# Patient Record
Sex: Female | Born: 1975
Health system: Southern US, Community
[De-identification: ages and names within clinical notes are randomized; demographics above are authoritative.]

## PROBLEM LIST (undated history)

## (undated) DIAGNOSIS — T4145XA Adverse effect of unspecified anesthetic, initial encounter: Secondary | ICD-10-CM

## (undated) DIAGNOSIS — I951 Orthostatic hypotension: Secondary | ICD-10-CM

## (undated) DIAGNOSIS — Z8701 Personal history of pneumonia (recurrent): Secondary | ICD-10-CM

## (undated) DIAGNOSIS — G971 Other reaction to spinal and lumbar puncture: Secondary | ICD-10-CM

## (undated) DIAGNOSIS — M797 Fibromyalgia: Secondary | ICD-10-CM

## (undated) DIAGNOSIS — F419 Anxiety disorder, unspecified: Secondary | ICD-10-CM

## (undated) DIAGNOSIS — Z9229 Personal history of other drug therapy: Secondary | ICD-10-CM

## (undated) DIAGNOSIS — R112 Nausea with vomiting, unspecified: Secondary | ICD-10-CM

## (undated) DIAGNOSIS — G90A Postural orthostatic tachycardia syndrome (POTS): Secondary | ICD-10-CM

## (undated) DIAGNOSIS — G8929 Other chronic pain: Secondary | ICD-10-CM

## (undated) DIAGNOSIS — R531 Weakness: Secondary | ICD-10-CM

## (undated) DIAGNOSIS — M2550 Pain in unspecified joint: Secondary | ICD-10-CM

## (undated) DIAGNOSIS — R000 Tachycardia, unspecified: Secondary | ICD-10-CM

## (undated) DIAGNOSIS — Z8709 Personal history of other diseases of the respiratory system: Secondary | ICD-10-CM

## (undated) DIAGNOSIS — J302 Other seasonal allergic rhinitis: Secondary | ICD-10-CM

## (undated) DIAGNOSIS — I10 Essential (primary) hypertension: Secondary | ICD-10-CM

## (undated) DIAGNOSIS — R351 Nocturia: Secondary | ICD-10-CM

## (undated) DIAGNOSIS — M62838 Other muscle spasm: Secondary | ICD-10-CM

## (undated) DIAGNOSIS — Z9889 Other specified postprocedural states: Secondary | ICD-10-CM

## (undated) DIAGNOSIS — Z8711 Personal history of peptic ulcer disease: Secondary | ICD-10-CM

## (undated) DIAGNOSIS — G43909 Migraine, unspecified, not intractable, without status migrainosus: Secondary | ICD-10-CM

## (undated) DIAGNOSIS — E119 Type 2 diabetes mellitus without complications: Secondary | ICD-10-CM

## (undated) DIAGNOSIS — R42 Dizziness and giddiness: Secondary | ICD-10-CM

## (undated) DIAGNOSIS — G932 Benign intracranial hypertension: Secondary | ICD-10-CM

## (undated) DIAGNOSIS — IMO0002 Reserved for concepts with insufficient information to code with codable children: Secondary | ICD-10-CM

## (undated) DIAGNOSIS — I498 Other specified cardiac arrhythmias: Secondary | ICD-10-CM

## (undated) DIAGNOSIS — G43709 Chronic migraine without aura, not intractable, without status migrainosus: Secondary | ICD-10-CM

## (undated) DIAGNOSIS — K219 Gastro-esophageal reflux disease without esophagitis: Secondary | ICD-10-CM

## (undated) DIAGNOSIS — M549 Dorsalgia, unspecified: Secondary | ICD-10-CM

## (undated) DIAGNOSIS — M2540 Effusion, unspecified joint: Secondary | ICD-10-CM

## (undated) DIAGNOSIS — Z8719 Personal history of other diseases of the digestive system: Secondary | ICD-10-CM

## (undated) DIAGNOSIS — R0600 Dyspnea, unspecified: Secondary | ICD-10-CM

## (undated) DIAGNOSIS — M1990 Unspecified osteoarthritis, unspecified site: Secondary | ICD-10-CM

## (undated) DIAGNOSIS — T7840XA Allergy, unspecified, initial encounter: Secondary | ICD-10-CM

## (undated) DIAGNOSIS — K5900 Constipation, unspecified: Secondary | ICD-10-CM

## (undated) DIAGNOSIS — J189 Pneumonia, unspecified organism: Secondary | ICD-10-CM

## (undated) DIAGNOSIS — F329 Major depressive disorder, single episode, unspecified: Secondary | ICD-10-CM

## (undated) DIAGNOSIS — F32A Depression, unspecified: Secondary | ICD-10-CM

## (undated) HISTORY — PX: CYSTOSCOPY W/ URETERAL STENT REMOVAL: SHX1430

## (undated) HISTORY — DX: Migraine, unspecified, not intractable, without status migrainosus: G43.909

## (undated) HISTORY — DX: Anxiety disorder, unspecified: F41.9

## (undated) HISTORY — PX: BREAST SURGERY: SHX581

## (undated) HISTORY — DX: Personal history of pneumonia (recurrent): Z87.01

## (undated) HISTORY — PX: ESOPHAGEAL DILATION: SHX303

## (undated) HISTORY — DX: Tachycardia, unspecified: R00.0

## (undated) HISTORY — DX: Gastro-esophageal reflux disease without esophagitis: K21.9

## (undated) HISTORY — DX: Benign intracranial hypertension: G93.2

## (undated) HISTORY — DX: Dyspnea, unspecified: R06.00

## (undated) HISTORY — PX: CHOLECYSTECTOMY: SHX55

## (undated) HISTORY — DX: Depression, unspecified: F32.A

## (undated) HISTORY — DX: Fibromyalgia: M79.7

## (undated) HISTORY — DX: Orthostatic hypotension: I95.1

## (undated) HISTORY — DX: Essential (primary) hypertension: I10

## (undated) HISTORY — DX: Reserved for concepts with insufficient information to code with codable children: IMO0002

## (undated) HISTORY — DX: Other specified postprocedural states: Z98.890

## (undated) HISTORY — DX: Other specified cardiac arrhythmias: I49.8

## (undated) HISTORY — PX: TUBAL LIGATION: SHX77

## (undated) HISTORY — PX: ABDOMINAL HYSTERECTOMY: SHX81

## (undated) HISTORY — DX: Postural orthostatic tachycardia syndrome (POTS): G90.A

## (undated) HISTORY — DX: Major depressive disorder, single episode, unspecified: F32.9

## (undated) HISTORY — PX: BLADDER SURGERY: SHX569

## (undated) HISTORY — DX: Other seasonal allergic rhinitis: J30.2

## (undated) HISTORY — PX: BACK SURGERY: SHX140

---
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
## 2001-02-24 HISTORY — PX: COLONOSCOPY: SHX174

## 2001-02-24 HISTORY — PX: ESOPHAGOGASTRODUODENOSCOPY: SHX1529

## 2001-12-23 ENCOUNTER — Ambulatory Visit (HOSPITAL_COMMUNITY): Admission: RE | Admit: 2001-12-23 | Discharge: 2001-12-23 | Payer: Self-pay | Admitting: Internal Medicine

## 2002-02-24 HISTORY — PX: ESOPHAGOGASTRODUODENOSCOPY: SHX1529

## 2003-01-02 ENCOUNTER — Emergency Department (HOSPITAL_COMMUNITY): Admission: EM | Admit: 2003-01-02 | Discharge: 2003-01-03 | Payer: Self-pay | Admitting: Emergency Medicine

## 2003-01-04 ENCOUNTER — Ambulatory Visit (HOSPITAL_COMMUNITY): Admission: RE | Admit: 2003-01-04 | Discharge: 2003-01-04 | Payer: Self-pay | Admitting: Internal Medicine

## 2004-01-03 ENCOUNTER — Ambulatory Visit: Payer: Self-pay | Admitting: Gastroenterology

## 2004-01-04 ENCOUNTER — Ambulatory Visit (HOSPITAL_COMMUNITY): Admission: RE | Admit: 2004-01-04 | Discharge: 2004-01-04 | Payer: Self-pay | Admitting: Internal Medicine

## 2004-01-25 ENCOUNTER — Ambulatory Visit (HOSPITAL_COMMUNITY): Admission: RE | Admit: 2004-01-25 | Discharge: 2004-01-25 | Payer: Self-pay | Admitting: Internal Medicine

## 2004-02-25 HISTORY — PX: ESOPHAGOGASTRODUODENOSCOPY: SHX1529

## 2004-12-05 ENCOUNTER — Ambulatory Visit: Payer: Self-pay | Admitting: Internal Medicine

## 2004-12-18 ENCOUNTER — Ambulatory Visit (HOSPITAL_COMMUNITY): Admission: RE | Admit: 2004-12-18 | Discharge: 2004-12-18 | Payer: Self-pay | Admitting: Internal Medicine

## 2004-12-18 ENCOUNTER — Ambulatory Visit: Payer: Self-pay | Admitting: Internal Medicine

## 2004-12-26 ENCOUNTER — Encounter (HOSPITAL_COMMUNITY): Admission: RE | Admit: 2004-12-26 | Discharge: 2005-01-25 | Payer: Self-pay | Admitting: Internal Medicine

## 2005-07-02 ENCOUNTER — Ambulatory Visit: Payer: Self-pay | Admitting: Internal Medicine

## 2005-10-15 ENCOUNTER — Ambulatory Visit: Payer: Self-pay | Admitting: Cardiology

## 2005-10-19 ENCOUNTER — Ambulatory Visit: Payer: Self-pay | Admitting: Cardiology

## 2005-10-22 ENCOUNTER — Ambulatory Visit: Payer: Self-pay | Admitting: Cardiology

## 2005-11-03 ENCOUNTER — Encounter: Payer: Self-pay | Admitting: Cardiology

## 2005-11-06 ENCOUNTER — Ambulatory Visit: Payer: Self-pay | Admitting: Cardiology

## 2005-11-10 ENCOUNTER — Ambulatory Visit: Payer: Self-pay | Admitting: Cardiology

## 2005-11-11 ENCOUNTER — Ambulatory Visit (HOSPITAL_COMMUNITY): Admission: AD | Admit: 2005-11-11 | Discharge: 2005-11-12 | Payer: Self-pay | Admitting: Cardiology

## 2005-11-11 ENCOUNTER — Ambulatory Visit: Payer: Self-pay | Admitting: Cardiology

## 2005-11-20 ENCOUNTER — Ambulatory Visit: Payer: Self-pay | Admitting: Internal Medicine

## 2005-12-01 ENCOUNTER — Ambulatory Visit: Payer: Self-pay | Admitting: Cardiology

## 2006-01-16 ENCOUNTER — Ambulatory Visit: Payer: Self-pay | Admitting: Cardiology

## 2006-04-16 ENCOUNTER — Encounter: Payer: Self-pay | Admitting: Cardiology

## 2006-07-26 ENCOUNTER — Encounter: Payer: Self-pay | Admitting: Cardiology

## 2006-08-19 ENCOUNTER — Encounter: Payer: Self-pay | Admitting: Cardiology

## 2006-09-08 ENCOUNTER — Ambulatory Visit: Payer: Self-pay | Admitting: Cardiology

## 2006-09-16 ENCOUNTER — Ambulatory Visit: Payer: Self-pay | Admitting: Cardiology

## 2007-01-19 ENCOUNTER — Ambulatory Visit: Payer: Self-pay | Admitting: Gastroenterology

## 2007-02-25 HISTORY — PX: ESOPHAGOGASTRODUODENOSCOPY: SHX1529

## 2007-12-04 ENCOUNTER — Emergency Department (HOSPITAL_COMMUNITY): Admission: EM | Admit: 2007-12-04 | Discharge: 2007-12-04 | Payer: Self-pay | Admitting: Emergency Medicine

## 2007-12-15 ENCOUNTER — Ambulatory Visit: Payer: Self-pay | Admitting: Internal Medicine

## 2007-12-21 ENCOUNTER — Ambulatory Visit: Payer: Self-pay | Admitting: Internal Medicine

## 2007-12-21 ENCOUNTER — Ambulatory Visit (HOSPITAL_COMMUNITY): Admission: RE | Admit: 2007-12-21 | Discharge: 2007-12-21 | Payer: Self-pay | Admitting: Internal Medicine

## 2008-02-08 ENCOUNTER — Ambulatory Visit (HOSPITAL_COMMUNITY): Admission: RE | Admit: 2008-02-08 | Discharge: 2008-02-09 | Payer: Self-pay | Admitting: Surgery

## 2008-02-08 ENCOUNTER — Encounter (INDEPENDENT_AMBULATORY_CARE_PROVIDER_SITE_OTHER): Payer: Self-pay | Admitting: Surgery

## 2008-02-25 HISTORY — PX: CARDIAC CATHETERIZATION: SHX172

## 2009-04-17 ENCOUNTER — Encounter: Payer: Self-pay | Admitting: Cardiology

## 2009-04-18 ENCOUNTER — Telehealth (INDEPENDENT_AMBULATORY_CARE_PROVIDER_SITE_OTHER): Payer: Self-pay | Admitting: *Deleted

## 2009-11-05 ENCOUNTER — Ambulatory Visit (HOSPITAL_COMMUNITY)
Admission: RE | Admit: 2009-11-05 | Discharge: 2009-11-05 | Payer: Self-pay | Source: Home / Self Care | Admitting: Orthopedic Surgery

## 2010-01-10 ENCOUNTER — Telehealth (INDEPENDENT_AMBULATORY_CARE_PROVIDER_SITE_OTHER): Payer: Self-pay | Admitting: *Deleted

## 2010-01-16 ENCOUNTER — Ambulatory Visit: Payer: Self-pay | Admitting: Internal Medicine

## 2010-01-16 DIAGNOSIS — R112 Nausea with vomiting, unspecified: Secondary | ICD-10-CM

## 2010-01-16 DIAGNOSIS — R1013 Epigastric pain: Secondary | ICD-10-CM

## 2010-01-16 DIAGNOSIS — R1310 Dysphagia, unspecified: Secondary | ICD-10-CM | POA: Insufficient documentation

## 2010-01-16 DIAGNOSIS — K219 Gastro-esophageal reflux disease without esophagitis: Secondary | ICD-10-CM

## 2010-01-21 DIAGNOSIS — G971 Other reaction to spinal and lumbar puncture: Secondary | ICD-10-CM

## 2010-01-21 DIAGNOSIS — R002 Palpitations: Secondary | ICD-10-CM | POA: Insufficient documentation

## 2010-01-21 DIAGNOSIS — R000 Tachycardia, unspecified: Secondary | ICD-10-CM | POA: Insufficient documentation

## 2010-01-21 DIAGNOSIS — I200 Unstable angina: Secondary | ICD-10-CM

## 2010-01-21 DIAGNOSIS — R072 Precordial pain: Secondary | ICD-10-CM | POA: Insufficient documentation

## 2010-01-22 ENCOUNTER — Ambulatory Visit: Payer: Self-pay | Admitting: Cardiology

## 2010-01-22 ENCOUNTER — Encounter: Payer: Self-pay | Admitting: Physician Assistant

## 2010-01-28 ENCOUNTER — Encounter: Payer: Self-pay | Admitting: Cardiology

## 2010-01-30 ENCOUNTER — Encounter (INDEPENDENT_AMBULATORY_CARE_PROVIDER_SITE_OTHER): Payer: Self-pay | Admitting: *Deleted

## 2010-02-06 ENCOUNTER — Telehealth (INDEPENDENT_AMBULATORY_CARE_PROVIDER_SITE_OTHER): Payer: Self-pay

## 2010-02-06 ENCOUNTER — Encounter: Payer: Self-pay | Admitting: Internal Medicine

## 2010-02-12 ENCOUNTER — Ambulatory Visit (HOSPITAL_COMMUNITY)
Admission: RE | Admit: 2010-02-12 | Discharge: 2010-02-12 | Payer: Self-pay | Source: Home / Self Care | Attending: Internal Medicine | Admitting: Internal Medicine

## 2010-02-20 ENCOUNTER — Ambulatory Visit: Payer: Self-pay | Admitting: Physician Assistant

## 2010-03-16 ENCOUNTER — Encounter (INDEPENDENT_AMBULATORY_CARE_PROVIDER_SITE_OTHER): Payer: Self-pay | Admitting: Internal Medicine

## 2010-03-26 NOTE — Progress Notes (Signed)
Summary: elevated bp & hr   Phone Note Call from Patient   Summary of Call: Patient c/o elevated bp and heart rate.  OV already scheduled for 11/29.  States she is just not feeling good.  States she had been put on Diltiazem by Dr. Neita Carp 2 wks. ago but is still running high.  Advised her that Dr. Andee Lineman is full to after January.  The 11/29 time is going to be the soonest we can get her in.  Since we have not seen her in 2 years, it will be hard to advise her until MD evaluates in office.  Advised her to call her PMD back since he initially prescribed new med which is not controlling her BP.  If PMD feels that she can not wailt till 11/29, he needs to call office and request MD for earlier appt.  .Patient verbalized understanding.  Initial call taken by: Hoover Brunette, LPN,  January 10, 2010 4:54 PM

## 2010-03-26 NOTE — Letter (Signed)
Summary: Engineer, materials at [facility]  [address]   [place], [place] [address]   Phone: [phone]  Fax: [phone]        January 30, 2010 MRN: [id number]    [facility] [address] [place], [place]  [address]    Dear [name],  Your test ordered by Selena Batten has been reviewed by your physician (or physician assistant) and was found to be normal or stable. Your physician (or physician assistant) felt no changes were needed at this time.  ____ Echocardiogram  ____ Cardiac Stress Test  ____ Lab Work  ____ Peripheral vascular study of arms, legs or neck  ____ CT scan or X-ray  ____ Lung or Breathing test  __X__ Other: Monitor   Thank you.   Cyril Loosen, RN, BSN    Duane Boston, M.D., F.A.C.C. Thressa Sheller, M.D., F.A.C.C. Oneal Grout, M.D., F.A.C.C. Cheree Ditto, M.D., F.A.C.C. Daiva Nakayama, M.D., F.A.C.C. Kenney Houseman, M.D., F.A.C.C. Jeanne Ivan, PA-C

## 2010-03-26 NOTE — Procedures (Signed)
Summary: Holter Monitor Final Summary  Holter Monitor Final Summary   Imported By: Cyril Loosen, RN, BSN 01/30/2010 15:02:52  _____________________________________________________________________  External Attachment:    Type:   Image     Comment:   External Document  Appended Document: Holter Monitor Final Summary Pt notified of results by letter.

## 2010-03-26 NOTE — Progress Notes (Signed)
Summary: PATIENT REQUESTING EARLIER APPT   Phone Note Call from Patient Call back at Home Phone [phone]   Caller: Patient Reason for Call: Talk to Nurse Details for Reason: [facility] Summary of Call: PATIENT CALLED STATED SHE WAS RELEASED FROM [facility] LAST NIGHT AND WAS ADVISED TO CONTACT OUR OFFICE FOR AN APPOINTMENT AND TO SET UP AN ECHO.  SHE WENT IN FOR RAPID HEART RATE AND DIZZINESS AND NAUSEA.  I MADE  APPOINTMENT FOR 3/29N (OUR FIRST AVAILABLE) ALSO PUTTING ON CXL LIST. I EXPLAINED TO HER THAT I WOULD FORWARD TO HIS NURSE TO DISCUSS WITH DEGENT.  IF HE FEELS SHE NEEDS TO BE SEEN SOONER THEN WE COULD CHANGE APPT DATE. Initial call taken by: Claudette Laws,  April 18, 2009 12:30 PM  Follow-up for Phone Call        gave patient appt for March 1st @ 1:45 Follow-up by: Claudette Laws,  April 20, 2009 2:26 PM

## 2010-03-26 NOTE — Assessment & Plan Note (Signed)
Summary: Sandra [name] stating her heart beating fast   Visit Type:  Follow-up Primary Provider:  Dr. Sherril Croon  CC:  heart racing and sob.  History of Present Illness: 35 yo with history of dysautonomia (possible POTS) and HTN presents for evaluation of palpitations.  Patient was evaluated in 2007 for tachypalpitations and chest pain.  She had a normal right and left heart cath at that time.  Monitor showed only sinus tachycardia.   She was thought to have a form of dysautonomia, possibly POTS.  She was started on atenolol at that time with considerable improvement.  However, she has now been off atenolol for over a year.  She had been doing well but has been under a lot of stress the last few months.  She was laid off this summer.  Since that time, she has had daily palpitations 2-3 times a day where she will feel her heart pound/race and will get a choking sensation in her throat.  No particular trigger.  No lightheadedness/syncope.  Heart rate has been as high as the 120s-130s when she has measured it during a spell of palpitations.  She had these same symptoms back in 2007.  She also had been running high blood pressure recently, but this has resolved with starting valsartan/HCT.    Orthostatics were negative.   ECG: NSR, normal  Preventive Screening-Counseling & Management  Alcohol-Tobacco     Smoking Status: never  Current Medications (verified): 1)  Ibuprofen 800 Mg Tabs (Ibuprofen) .... As Needed 2)  Amitriptyline Hcl 25 Mg Tabs (Amitriptyline Hcl) .... Take 1 Tablet By Mouth Once A Day 3)  Diovan Hct 160-12.5 Mg Tabs (Valsartan-Hydrochlorothiazide) .... 1/2 Tablet Daily 4)  Execedrin .... As Needed 5)  Nexium 40 Mg Cpdr (Esomeprazole Magnesium) .... One By Mouth 30 Mins Before Breakfast Daily  Allergies (verified): 1)  ! Vicodin 2)  ! * Ambien  Comments:  Nurse/Medical Assistant: The patient's medications and allergies were verbally reviewed with the patient and were updated in  the Medication and Allergy Lists.  Past History:  Past Medical History: 1. Fibromyalgia. 2. Depression. 3. Anxiety. 4. Migraine headaches. 5. Hypertension. 6. Chest pain and dyspnea: RHC/LHC in 2007 with normal PCWP and PA pressure, normal coronaries, EF 55% 7. Possible POTS: episodes of sinus tachycardia.  Holter in 2007 with sinus tachycardia with rates as high at 160.  8. History of duodenal ulcer in 1993. 9. GERD with esophageal stricture 10. Hysterectomy 11. Cholecystectomy  Family History: Mother, kidney cancer, diabetes mellitus.   Maternal grandmother died with pancreatic cancer. Maternal grandfather had prostate cancer.   Father and paternal grandmother had ulcers/stomach cancer. Paternal Grandmother died with metastatic lung cancer.  Father with CAD, PCI at age 1 Grandfather with AAA repair  Social History: Divorced.  She has 2 children. Lost job at [employer]. She is a nonsmoker.  No alcohol use.  Lives in [place].  Smoking Status:  never  Review of Systems       All systems reviewed and negative except as per HPI.   Vital Signs:  Patient profile:   35 year old female Height:      67 inches Weight:      267 pounds Pulse rate:   82 / minute Pulse (ortho):   96 / minute BP sitting:   125 / 85  (left arm) BP standing:   117 / 87 Cuff size:   large  Vitals Entered By: Carlye Grippe (January 22, 2010 10:02 AM)  Serial  Vital Signs/Assessments:  Time      Position  BP       Pulse  Resp  Temp     By 10:58 AM  Lying RA  118/80   75                    Cyril Loosen, RN, BSN 10:58 AM  Sitting   123/84   80                    Cyril Loosen, RN, BSN 10:58 AM  Standing  117/87   96                    Cyril Loosen, Charity fundraiser, BSN  Comments: 10:58 AM 2 mins standing BP 122/87 HR 90, 5 mins standing BP 124/88, HR 92 By: Cyril Loosen, RN, BSN   CC: heart racing,sob   Physical Exam  General:  Well developed, well nourished, in no acute distress.  Obese.    Head:  normocephalic and atraumatic Nose:  no deformity, discharge, inflammation, or lesions Mouth:  Teeth, gums and palate normal. Oral mucosa normal. Neck:  Neck supple, no JVD. No masses, thyromegaly or abnormal cervical nodes. Lungs:  Clear bilaterally to auscultation and percussion. Heart:  Non-displaced PMI, chest non-tender; regular rate and rhythm, S1, S2 without murmurs, rubs or gallops. Carotid upstroke normal, no bruit.  Pedals normal pulses. No edema, no varicosities. Abdomen:  Bowel sounds positive; abdomen soft and non-tender without masses, organomegaly, or hernias noted. No hepatosplenomegaly. Msk:  Back normal, normal gait. Muscle strength and tone normal. Extremities:  No clubbing or cyanosis. Neurologic:  Alert and oriented x 3. Skin:  Intact without lesions or rashes. Psych:  anxious.     Impression & Recommendations:  Problem # 1:  PALPITATIONS (ICD-785.1) Tachypalpitations with heart rate up to the 120s-130s at times.  Same symptoms as in 2007.  Clinical picture at that time was thought to be consistent with a dysautonomia, possibly POTS.  Atenolol helped a lot in the past.  Negative orthostatics today.   I will get an echo to make sure her heart is structurally normal and will get a 48 hour holter to reassess rhythm.  I will start her on low-dose atenolol 25 mg daily.   Other Orders: EKG w/ Interpretation (93000) Holter Monitor (Holter Monitor) 2-D Echocardiogram (2D Echo)  Patient Instructions: 1)  Follow up with Gene Serpe, PA-C on Wed, February 20, 2010 at 1:20pm. 2)  Your physician has requested that you have an echocardiogram.  Echocardiography is a painless test that uses sound waves to create images of your heart. It provides your doctor with information about the size and shape of your heart and how well your heart's chambers and valves are working.  This procedure takes approximately one hour. There are no restrictions for this procedure. 3)  Your  physician has recommended that you wear a holter monitor.  Holter monitors are medical devices that record the heart's electrical activity. Doctors most often use these monitors to diagnose arrhythmias. Arrhythmias are problems with the speed or rhythm of the heartbeat. The monitor is a small, portable device. You can wear one while you do your normal daily activities. This is usually used to diagnose what is causing palpitations/syncope (passing out). 4)  Start Atenolol 25mg  by mouth once daily. 5)  Call the office if top number of your blood press is running less than 100.  Prescriptions: ATENOLOL 25 MG TABS (ATENOLOL)  Take one tablet by mouth daily  #30 x 6   Entered by:   Cyril Loosen, RN, BSN   Authorized by:   Marca Ancona, MD   Signed by:   Cyril Loosen, RN, BSN on 01/22/2010   Method used:   Electronically to        [facility] (retail)       [address]       [place], [place]  [address]       Ph: [phone]       Fax: [phone]   RxID:   [id number]   Handout requested.

## 2010-03-26 NOTE — Procedures (Signed)
Summary: Pulmonary/ PULSE OXIMETRY  Pulmonary/ PULSE OXIMETRY   Imported By: Dorise Hiss 04/23/2009 15:13:36  _____________________________________________________________________  External Attachment:    Type:   Image     Comment:   External Document

## 2010-03-26 NOTE — Procedures (Signed)
Summary: Holter and Event/ [facility]ARD[facility] END OF SERVICE SUMMARY REPORT  Holter and Event/ CARDIONET END OF SERVICE SUMMARY REPORT   Imported By: Dorise Hiss 04/23/2009 15:01:12  _____________________________________________________________________  External Attachment:    Type:   Image     Comment:   External Document

## 2010-03-26 NOTE — Letter (Signed)
Summary: EGD/ED ORDER  EGD/ED ORDER   Imported By: Ave Filter 01/16/2010 12:27:39  _____________________________________________________________________  External Attachment:    Type:   Image     Comment:   External Document

## 2010-03-28 NOTE — Letter (Signed)
Summary: EGD/ED ORDERS  EGD/ED ORDERS   Imported By: Rexene Alberts 02/06/2010 16:25:01  _____________________________________________________________________  External Attachment:    Type:   Image     Comment:   External Document

## 2010-03-28 NOTE — Assessment & Plan Note (Signed)
Summary: DYSPHAGIA/CONSULT FOR EGD/SS   Visit Type:  Initial Consult Referring [name]:  [facility] [name]:  [facility]  Chief Complaint:  dysphagia.  History of Present Illness: Sandra [name] is here for further evaluation of dysphagia. She was last seen in 2009. She notes over the last several weeks she has had several episodes of "choking" with and without meals. She may be sitting or laying when she feels/hears gurgling in her chest. She becomes SOB and has starts to cough. She will usually regurgitate bitter fluid up when this occurs. She c/o dysphagia to solids and water. No heartburn. Off PPI for one year, takes OTC antacids only. Epigasatric pain and bloating. BM unchanged. Some pp loose stool. Some intermittent constipation. No melena, brbpr. No unintentional weight loss. In 10/09 she weighted 280. She states she lost down to 240 pounds but with increased stress she is back up to 264.  She c/o increased stress. Divorced finalized couple of months ago. She lost her job at [employer] after  ten years. She was offered a transfer to [employer] but couldn't take it due to her family situation.  She has been having increased difficulty with hypertension and tachycardia.  She went to ED in [place] two days ago. She states CXR negative for PNA. She was given Protonix IV. She reports her lungs checked out okay.  Current Medications (verified): 1)  Ibuprofen 800 Mg Tabs (Ibuprofen) .... As Needed 2)  Amitriptyline Hcl 25 Mg Tabs (Amitriptyline Hcl) .... Take 1 Tablet By Mouth Once A Day 3)  Diovan Hct 160-12.5 Mg Tabs (Valsartan-Hydrochlorothiazide) .... 1/2 Tablet Daily 4)  Execedrin .... As Needed  Allergies (verified): 1)  ! Vicodin 2)  ! * Ambien  Past History:  Past Medical History: Fibromyalgia. Depression. Anxiety. Migraine headaches. Hypertension. She underwent cardiac catheterization this year due to syncope.     She states it was okay. Possible POTS History of  duodenal ulcer in 1993. Allergies. EGD, 10/09-->small hiatal hernia EGD, 2006-->noncritical Schatzki's ring proximal to GEJ, s/p 54 and 36F Maloney dilatioin. TCS, 2003-->moderate external hemorrhoids with normal TI   Past Surgical History: She had a bladder tack. 8. Esophagus stretched on multiple occasions. 9. Hysterectomy. 10.Tubal ligation. Cholecystectomy  Family History: Mother, kidney cancer, diabetes mellitus.   Maternal grandmother died with pancreatic cancer. Maternal grandfather had prostate cancer.   Father and paternal grandmother had ulcers/stomach cancer. Paternal Grandmother died with metastatic lung cancer.   Social History: Divorced.  She has 2 children. Lost job at [employer]. She is a nonsmoker.  No alcohol use.   Review of Systems General:  Denies fever, chills, sweats, anorexia, fatigue, weakness, and weight loss. Eyes:  Denies vision loss. ENT:  Complains of difficulty swallowing; denies nasal congestion, sore throat, and hoarseness. CV:  Complains of palpitations; denies chest pains, angina, dyspnea on exertion, and peripheral edema. Resp:  Complains of dyspnea with exercise; denies dyspnea at rest, cough, sputum, and wheezing. GI:  See HPI. GU:  Denies urinary burning and blood in urine. MS:  Complains of joint pain / LOM. Derm:  Denies rash and itching. Neuro:  Complains of headache; denies weakness, frequent headaches, and confusion. Psych:  Denies depression and anxiety. Endo:  Denies unusual weight change. Heme:  Denies bruising and bleeding. Allergy:  Denies hives and rash.  Vital Signs:  Patient profile:   35 year old female Height:      67 inches Weight:      264 pounds BMI:  41.50 Temp:     98.6 degrees F oral Pulse rate:   60 / minute BP sitting:   128 / 90  (left arm) Cuff size:   large  Vitals Entered By: Cloria Spring LPN (January 16, 2010 9:12 AM)  Physical Exam  General:  Well developed, well nourished, no acute  distress.obese.   Head:  Normocephalic and atraumatic. Eyes:  sclera nonicteric Mouth:  Oropharyngeal mucosa moist, pink.  No lesions, erythema or exudate.    Neck:  Supple; no masses or thyromegaly. Lungs:  Clear throughout to auscultation. Heart:  Regular rate and rhythm; no murmurs, rubs,  or bruits. Abdomen:  Obese. Positive BS. Soft. No HSM or masses. No rebound or guarding. No abd bruit or hernia. Mild lower abd tenderness. Extremities:  No clubbing, cyanosis, edema or deformities noted. Neurologic:  Alert and  oriented x4;  grossly normal neurologically. Skin:  Intact without significant lesions or rashes. Cervical Nodes:  No significant cervical adenopathy. Psych:  Alert and cooperative. Normal mood and affect.  Impression & Recommendations:  Problem # 1:  DYSPHAGIA UNSPECIFIED (ICD-787.20)  Dysphagia to solids and liquids. She also has interesting symptoms of "choking" at times not related to meals. It sounds like she is having significant regurgitation which induces the coughing and vomiting spells. She typcial heartburn. She is off her PPI, takes OTC antacids only. Does not take significanct ASA or NSAIDS.   She also has epigastric pain. GB out a couple of years ago. ?gastritis, GERD, PUD, hiatal hernia.   EGD/ED to be performed in near future.  Risks, alternatives, benefits including but not limited to risk of reaction to medications, bleeding, infection, and perforation addressed.  Patient voiced understanding and verbal consent obtained.   Start Nexium 40mg  by mouth daily, #15 and RX given. She will follow antireflux measures.  Orders: Est. Patient Level IV (82956) Prescriptions: NEXIUM 40 MG CPDR (ESOMEPRAZOLE MAGNESIUM) one by mouth 30 mins before breakfast daily  #30 x 11   Entered and Authorized by:   Leanna Battles. Dixon Boos   Signed by:   Leanna Battles Lewis PA-C on 01/16/2010   Method used:   Print then Give to Patient   RxID:   [id number]

## 2010-03-28 NOTE — Progress Notes (Signed)
Summary: phone note/ pt requests Protonix  Phone Note Call from Patient   Caller: Patient Summary of Call: Pt called to reschedule her EGD/ED that she had had to cancel due to the death of her grandfather. She is rescheduled for  02/12/2010 at 8:00 AM. Selena Batten is aware. She had her instructions. She also said the Nexium is not helping much. She would like to try Protonix. She uses [facility] in [place].  Initial call taken by: Cloria Spring LPN,  February 06, 2010 10:13 AM     Appended Document: phone note/ pt requests Protonix ok on protonix 40 mg daily; #30 w 2 more refills  Appended Document: phone note/ pt requests Protonix Pt informed. Rx called to Diane at [facility].

## 2010-03-28 NOTE — Letter (Signed)
Summary: Appointment -missed  [facility] [address]   [place], [place] [address]   Phone: [phone]  Fax: [phone]     February 20, 2010 MRN: [id number]    [facility] [address] [place], [place]  [address]    Dear [name],  Our records indicate you missed your appointment on February 20, 2010                        with Gene Serpe.   It is very important that we reach you to reschedule this appointment. We look forward to participating in your health care needs.   Please contact us at the number listed above at your earliest convenience to reschedule this appointment.   Sincerely,    Glass blower/designer

## 2010-04-29 ENCOUNTER — Emergency Department (HOSPITAL_COMMUNITY)
Admission: EM | Admit: 2010-04-29 | Discharge: 2010-04-29 | Disposition: A | Discharge status: Home / Self Care | Payer: No Typology Code available for payment source | Attending: Emergency Medicine | Admitting: Emergency Medicine

## 2010-04-29 ENCOUNTER — Emergency Department (HOSPITAL_COMMUNITY): Payer: No Typology Code available for payment source

## 2010-04-29 DIAGNOSIS — Y929 Unspecified place or not applicable: Secondary | ICD-10-CM | POA: Insufficient documentation

## 2010-04-29 DIAGNOSIS — IMO0001 Reserved for inherently not codable concepts without codable children: Secondary | ICD-10-CM | POA: Insufficient documentation

## 2010-04-29 DIAGNOSIS — T148XXA Other injury of unspecified body region, initial encounter: Secondary | ICD-10-CM | POA: Insufficient documentation

## 2010-04-29 DIAGNOSIS — M542 Cervicalgia: Secondary | ICD-10-CM | POA: Insufficient documentation

## 2010-04-29 DIAGNOSIS — S139XXA Sprain of joints and ligaments of unspecified parts of neck, initial encounter: Secondary | ICD-10-CM | POA: Insufficient documentation

## 2010-04-29 DIAGNOSIS — IMO0002 Reserved for concepts with insufficient information to code with codable children: Secondary | ICD-10-CM | POA: Insufficient documentation

## 2010-04-29 DIAGNOSIS — Z79899 Other long term (current) drug therapy: Secondary | ICD-10-CM | POA: Insufficient documentation

## 2010-07-09 NOTE — Assessment & Plan Note (Signed)
[facility]                          [facility] OFFICE NOTE   NAME:[name], [name]                   MRN:          [id number]  DATE:09/08/2006                            DOB:          1976/02/22    HISTORY OF PRESENT ILLNESS:  The patient is a 35 year old female with  history of frequent palpitations.  The patient has been recently  diagnosed with obstructive sleep apnea of a moderate degree.  The  patient has had no follow up for this, however.  We have worked her up  previously with a cardiac catheterization; and also ruled her out for  pulmonary embolism.  She is felt to have possible POP syndrome and has  been maintained on atenolol per Dr. Graciela [name].  The patient also sees Dr.  Sandria [name] for migraine headaches and fibromyalgias.   From a cardiovascular aspect the patient appears to be stable.  She does  report increased lower extremity swelling as well as hand swelling.  She  is also found to be hypertensive in the clinic today.  She states that  on her recent exam with Dr. Sandria [name] she was also hypertensive.   MEDICATIONS INCLUDE:  Cymbalta, atenolol, Aciphex, Wellbutrin, Topamax.   PHYSICAL EXAMINATION:  VITAL SIGNS:  Blood pressure 137/93, heart rate  94, weight is 272 pounds.  NECK EXAM:  Normal carotid upstroke.  No carotid bruits.  LUNGS:  Clear breath sounds bilaterally.  HEART:  Regular rate and rhythm normal S1 S2, no murmurs, rubs, or  gallops.  ABDOMEN:  Soft.  EXTREMITIES EXAM:  No clubbing, cyanosis, or edema.  NEUROLOGIC:  The patient alert, oriented, and grossly nonfocal.   A 12-lead electrocardiogram -- normal sinus rhythm, no acute ischemic  changes.   PROBLEMS:  1. Palpitations, stable.  2. Rule out POP syndrome.  3. Orthostatic hypotension, resolved.  4. Normal cardiac catheterization.  5. Normal CT negative for pulmonary embolism.  6. Obesity.  7. Fibromyalgias.  8. Lower extremity edema.  9. Hypertension.  10.Migraine headaches.   PLAN:  1. Given the patient a prescription for hydrochlorothiazide for blood      pressure control.  She will have her nurse visit in one week to      recheck her blood pressure.  2. I have asked the patient to see Dr. Nigel [name]  as she had not      tolerating her CPAP mask very      well and clearly has obstructive sleep apnea, will ask Dr. Nigel [name]      for further      recommendations.  3. The patient can follow up with [place] in six months.     Sandra Codding, MD,FACC  Electronically Signed    GED/MedQ  DD: 09/08/2006  DT: 09/09/2006  Job #: [id number]

## 2010-07-09 NOTE — H&P (Signed)
NAME:  Sandra [name], Sandra [name]            ACCOUNT NO.:  [id number]   MEDICAL RECORD NO.:  [id number]          PATIENT TYPE:  AMB   LOCATION:  DAY                           FACILITY:  [facility]   PHYSICIAN:  R. Roetta Sessions, M.D. DATE OF BIRTH:  17-Mar-1975   DATE OF ADMISSION:  DATE OF DISCHARGE:  LH                              HISTORY & PHYSICAL   CHIEF COMPLAINT:  Right-sided abdominal pain, nausea, vomiting, and  diarrhea.   EGD prior to cholecystectomy.   HISTORY OF PRESENT ILLNESS:  The patient is a pleasant 35 year old obese  Caucasian female who presents today requesting an upper endoscopy prior  to cholecystectomy.  She states that she recently developed acute right-  sided abdominal pain associated with nausea and vomiting postprandially.  She also had a change in her bowel movements generally constipated and  now having postprandial loose stools.  Pain radiates into her back.  She  has had a workup including abdominal ultrasound, which was unremarkable.  Her LFTs were normal.  CBC unremarkable.  She was seen in the Emergency  Department on December 04, 2007.  She states that Dr. Dian [name] office did  a HIDA scan at [facility] last week and she had a  gallbladder ejection fraction of 33%.  Previously done through our  system, it was at 47% a couple of years ago.  Because of this change in  her current symptoms, it was felt that she need to have her gallbladder  out.  She also noted nausea after drinking Half & Half with the test.  She saw Dr. Gerrit [name] in consultation on Monday.  She tells me that he has  recommended that she have an upper endoscopy prior to considering a  cholecystectomy to exclude any upper GI source.  She does take ibuprofen  about 3 times a week.  She has had some mild regurgitation, but no  heartburn.  She takes Tums p.r.n.  She has been on PPIs in the past, but  then came off on her own.  The last time we saw her actually was in  November 2008.  Her  last colonoscopy was in October 2006.  She had a  noncritical Schatzki ring proximal to the GE junction, small hiatal  hernia.  Last colonoscopy was in 2003.  She had a moderate external  hemorrhoids and normal terminal ileoscopy.   CURRENT MEDICATIONS:  1. Wellbutrin 150 mg daily.  2. Celexa 20 mg daily.  3. Ibuprofen 800 mg two times a week.  4. Hydrochlorothiazide 25 mg daily.  5. Zofran 8 mg q.4-6 h. p.r.n.  6. Ultram 50 mg p.r.n.  7. Probiotics p.r.n.  8. Tums p.r.n.   ALLERGIES:  Verna Czech.   PAST MEDICAL HISTORY:  Fibromyalgia, depression, anxiety, migraine  headache, hypertension, unremarkable cardiac catheterization 2008 due to  syncope, history of bladder tack, prior EGDs and colonoscopies as above,  hysterectomy, tubal ligation, history of duodenal ulcer in 1993, and  allergies.   FAMILY HISTORY:  Mother had kidney cancer status post resection and  diabetes, maternal grandmother died with pancreatic cancer, maternal  grandfather had prostate cancer,  father and paternal grandmother had  ulcers, and grandmother died with metastatic lung cancer.   SOCIAL HISTORY:  She is married.  She has 2 children.  She is employed  at [employer].  She is a nonsmoker.  No alcohol use.   REVIEW OF SYSTEMS:  See HPI for GI.  CONSTITUTIONAL:  No weight loss.  CARDIOPULMONARY:  No chest pain, shortness of breath, palpitations, or  cough.  GENITOURINARY:  No dysuria or hematuria.   PHYSICAL EXAMINATION:  VITAL SIGNS:  Weight 280, height 5 feet 10  inches, temperature 98.2, blood pressure 122/88, and pulse 76.  GENERAL:  Pleasant, obese, Caucasian female in no acute distress.  SKIN:  Warm and dry.  No jaundice.  HEENT:  Sclera nonicteric.  Oropharyngeal mucosa moist and pink.  No  lesion, erythema, or exudates.  CHEST:  Lungs are clear to auscultation.  CARDIAC:  Regular rate and rhythm.  Normal S1 and S2.  No murmurs, rubs,  or gallops.  ABDOMEN:  Positive bowel sounds.   Abdomen is obese, but symmetrical.  Abdomen is soft, nondistended.  She has moderate tenderness in the right  upper quadrant region to deep palpation.  No rebound or guarding.  No  organomegaly or masses.  No abdominal bruits or hernia.  LOWER EXTREMITIES:  No edema.   IMPRESSION:  Sandra [name] is a 35 year old lady with acute onset of  right upper abdominal pain with radiation into the back postprandial,  postprandial nausea and vomiting and has had change in bowels from  constipation to diarrhea.  She has chronic gastroesophageal reflux  disease, which appears quiescent.  She is not on a PPI at this time.  She does have chronic irritable bowel syndrome, but tends to have more  lower abdominal pain and constipation with her symptoms.  She has had a  change in her gallbladder ejection fraction and subjective reproduction  of her symptoms with Half & Half fatty meal challenge.  I discussed with  her today that her gallbladder probably is playing a role in some of her  symptoms.  Given her chronic gastrointestinal issues, it would be  reasonable to exclude upper gastrointestinal source per Dr. Ardine Eng  request.   PLAN:  1. EGD with Dr. Jena Gauss in the near future.  2. I have asked her to try Kapidex 60 mg daily, #20 samples and to see      if it makes any difference in her symptoms to potentially exclude      symptoms secondary to GERD.  3. Further recommendations to follow.      Tana Coast, P.AJonathon Bellows, M.D.  Electronically Signed    LL/MEDQ  D:  12/15/2007  T:  12/16/2007  Job:  [id number]   cc:   [name]  Fax: [phone]   Velora Heckler, MD  [phone] [address] [place]  [place] [address]

## 2010-07-09 NOTE — Op Note (Signed)
NAMEVONETTE, GROSSO            ACCOUNT NO.:  [id number]   MEDICAL RECORD NO.:  [id number]          PATIENT TYPE:  OIB   LOCATION:  1605                         FACILITY:  [facility]   PHYSICIAN:  Velora Heckler, MD      DATE OF BIRTH:  1976-01-29   DATE OF PROCEDURE:  02/08/2008  DATE OF DISCHARGE:                               OPERATIVE REPORT   PREOPERATIVE DIAGNOSIS:  Abdominal pain, biliary dyskinesia.   POSTOPERATIVE DIAGNOSIS:  Abdominal pain, biliary dyskinesia.   PROCEDURE:  Laparoscopic cholecystectomy with intraoperative  cholangiography.   SURGEON:  Darnell Level, M.D., FACS   ASSISTANT:  Harriette Bouillon, M.D., FACS   ANESTHESIA:  General.   ESTIMATED BLOOD LOSS:  Minimal.   PREPARATION:  Betadine.   COMPLICATIONS:  None.   INDICATIONS:  The patient is a 35 year old white female from [place], [place].  The patient presented in October with a several-week history  of right upper quadrant abdominal pain radiating to the back associated  with nausea and vomiting.  She had noted intolerance to dairy products.  She had a longstanding history of gastrointestinal problems including  esophagitis, esophageal stricture, hiatal hernia with reflux, gastritis,  and peptic ulcer disease.  She is followed by Dr. Karilyn Cota.  The patient  was evaluated in the emergency department in [place] on December 03, 2007.  Abdominal ultrasound was normal.  Laboratory studies were normal.  Nuclear medicine hepatobiliary scanning at [facility] on December 09, 2007, showed a low gallbladder ejection fraction of 33%.  The  patient was referred to general surgery for consideration for  cholecystectomy.   BODY OF REPORT:  Procedure is done in OR #6 at the [facility].  The patient is brought to the operating room and placed in  supine position on the operating room table.  Following administration  of general anesthesia, the patient is prepped and draped in usual  strict  aseptic fashion.  After ascertaining that an adequate level of  anesthesia has been achieved, a previous infraumbilical scar is reopened  with a #15 blade.  Dissection is carried through subcutaneous tissues.  Fascia is incised in the midline, and the peritoneal cavity is entered  cautiously.  Zero Vicryl pursestring suture is placed in the fascia.  A  Hasson cannula is introduced and secured with the pursestring suture.  Abdomen is insufflated with carbon dioxide.  Laparoscope is introduced  and the abdomen explored.  Operative ports are placed along the right  costal margin in the midline, midclavicular line, and anterior axillary  line.  Fundus of the gallbladder is grasped and retracted cephalad.  There is a moderate amount of dense adhesions to the undersurface of the  liver and the gallbladder.  These are gently taken down with blunt  dissection and hemostasis obtained with electrocautery.  Cystic duct is  dissected out.  A clip is placed at the neck of the gallbladder.  Cystic  duct is incised.  Clear yellow bile emanates from the cystic duct.  A  Cook cholangiography catheter was inserted through a stab wound in the  right  upper quadrant.  It is inserted into the cystic duct and secured  with a Ligaclip.  Using C-arm fluoroscopy, real-time cholangiography is  performed.  There is rapid filling of a normal caliber common bile duct.  There is free flow into the duodenum without filling defect or  obstruction.  There is reflux of contrast into the right and left  hepatic ductal systems.  Clip is withdrawn, and Cook catheter is removed  from the peritoneal cavity.  Cystic duct is triply clipped and divided.  Cystic artery is dissected out, doubly clipped, and divided.  The  posterior branch of the cystic artery was doubly clipped and divided.  Gallbladder is then excised from the gallbladder bed using the hook  electrocautery for hemostasis.  Gallbladder is completely excised  and  placed into an EndoCatch bag.  It is withdrawn through the umbilical  port without difficulty.  On palpation, it does not appear to contain  stones.  Right upper quadrant is copiously irrigated with warm saline  which is evacuated.  Good hemostasis is noted.  Pneumoperitoneum is  released, and ports are removed.  Good hemostasis is noted at all port  sites.  Pneumoperitoneum is evacuated.  Zero Vicryl pursestring suture  is tied securely.  Wounds are anesthetized with local anesthetic.  Skin  incisions are closed with interrupted 4-0 Vicryl subcuticular sutures.  Wounds washed and dried and Benzoin and Steri-Strips are applied.  Sterile dressings are applied.  The patient is awakened from anesthesia  and brought to the recovery room in stable condition.  The patient  tolerated the procedure well.      Velora Heckler, MD  Electronically Signed     TMG/MEDQ  D:  02/08/2008  T:  02/08/2008  Job:  [id number]   cc:   [name]  Fax: [phone]   Lionel December, M.D.  Fax: [phone]   Velora Heckler, MD  [phone] [address] [place]  [place] [address]

## 2010-07-09 NOTE — Op Note (Signed)
NAME:  Sandra [name], Sandra [name]            ACCOUNT NO.:  [id number]   MEDICAL RECORD NO.:  [id number]          PATIENT TYPE:  AMB   LOCATION:  DAY                           FACILITY:  [facility]   PHYSICIAN:  R. Roetta Sessions, M.D. DATE OF BIRTH:  January 09, 1976   DATE OF PROCEDURE:  12/21/2007  DATE OF DISCHARGE:                                PROCEDURE NOTE   INDICATIONS FOR PROCEDURE:  A 35 year old lady with intermittent  postprandial right upper quadrant abdominal pain documented diminution  in gallbladder function on serial HIDAs with some reproduction of the  patient's symptoms with fatty meal.  She has chronic gastroesophageal  reflux disease.  These symptoms of which have been fairly quiescent  recently.  She markedly differentiates her recent right upper quadrant  symptoms from reflux symptoms she has had the past.  We started her on  some Capadex 60 mg orally daily last week.  It has not affected right  upper quadrant abdominal pain in any way.  She does take occasional  ibuprofen.  She has seen Dr. Lyda Perone in [place].  Apparently, attempt  plans were made to remove her gallbladder.  EGD is now being done to  look at other occult pathology which may be contributing to her  symptoms.  This approach has been discussed with the patient at length.  Risks, benefits, and alternatives have been reviewed, questions  answered.  Please see documentation of the medical record.   PROCEDURE NOTE:  O2 saturation, blood pressure, pulse, respirations were  monitored throughout the entire procedure.   CONSCIOUS SEDATION:  Versed 5 mg IV, Demerol 100 mg IV in divided doses,  Phenergan 25 mg delusional IV push to augment conscious sedation,  Cetacaine spray for topical pharyngeal anesthesia.   INSTRUMENTATION:  Pentax video chip system.   FINDINGS:  Examination of the tubular esophagus revealed no mucosal  abnormalities.  EG junction was easily traversed.  Stomach:  Gas cavity  was emptied and insufflated  well with air.  A thorough examination of  the gastric mucosa including retroflexion to proximal stomach and  esophagogastric junction demonstrated only a small hiatal hernia.  Pylorus patent, easily traversed.  Examination of the bulb and second  portion revealed no abnormalities.   THERAPEUTIC/DIAGNOSTIC MANEUVERS PERFORMED:  None.   The patient tolerated the procedure well.  It was reactive endoscopy.   IMPRESSION:  Normal esophagus, small hiatal hernia, otherwise normal  stomach D1 and D2.   Ms. Esses Leep' recent symptoms sound more like gallbladder to be than  anything else and I have recommended to Sandra [name] she go ahead and  get her gallbladder out.  I told her there is no acute issue or 100%  guarantee that all if her GI symptoms would resolve.  I told that I  would anticipate her GERD symptoms to be essentially unchanged.  She also has irritable bowel syndrome and getting her gallbladder out  would not improve those symptoms and in fact she could have potential  worsening of her diarrhea, but overall I feel that at this time it would  be in her best interest to go ahead and  proceed with cholecystectomy.      Jonathon Bellows, M.D.  Electronically Signed     RMR/MEDQ  D:  12/21/2007  T:  12/22/2007  Job:  [id number]   cc:   Velora Heckler, MD  [phone]  [place]  [place] [address]   [name]  Fax: [phone]

## 2010-07-09 NOTE — Assessment & Plan Note (Signed)
NAMEJOHNATHAN, Sandra [name]             CHART#:  [id number]   DATE:  01/19/2007                       DOB:  1975-09-25   CHIEF COMPLAINT:  Knots in my stomach and burning in my esophagus.   HISTORY OF PRESENT ILLNESS:  The patient is a 35 year old Caucasian  female with a history of chronic GERD and chronic constipation who  presents with flare in her symptoms.  We last saw her in May 2007.  She  also called in October 2007, wanting a PPI other than Zegerid as it did  not seem to be helping.  We gave her some samples of Aciphex, which she  took and at that point she had been off of her PPI.  She has been eating  a lot of Tums for her acid reflux.  She has been having a lot of  epigastric pain and burning up in her esophagus.  She complains of pain  under both ribs.  She notes she is having about 2 bowel movements a week  which is a chronic problem for her.  She also has to strain and sees  blood in the stool.  She had some problems swallowing meat.  She  complains of nocturnal epigastric discomfort.  Her weight is up 16  pounds since we last saw her.  Her last EGD was in October 2006, and she  had a non-critical Schatzki's ring proximal to the GE junction, small  hiatal hernia, patchy coating of the mucosa of the body of the esophagus  with negative KOH.  She also had dilation with 54 and 56 [place] Maloney  dilators.  Her colonoscopy was back in 2003.  She had moderate external  hemorrhoids and otherwise normal colonoscopy and terminal ileoscopy.   She denies nausea and vomiting.  Denies any melena.   CURRENT MEDICATIONS:  1. Wellbutrin 150 mg daily.  2. Celexa, she believes is 20 mg daily.  3. Ibuprofen 800 mg as needed which is not frequent.  4. Lidoderm patches p.r.n. fibromyalgia pain.  5. Hydrochlorothiazide 25 mg daily.  6. Lorazepam 0.5 mg p.r.n.  7. Frova p.r.n. migraines.  8. Flexeril p.r.n.   ALLERGIES:  VICODIN.   PAST MEDICAL HISTORY:  1. Fibromyalgia.  2.  Depression.  3. Anxiety.  4. Migraine headaches.  5. Hypertension.  6. She underwent cardiac catheterization this year due to syncope.      She states it was okay.  7. She had a bladder tack.  8. Esophagus stretched on multiple occasions.  9. Hysterectomy.  10.Tubal ligation.  11.Colonoscopy as above.  12.History of duodenal ulcer in 1993.  13.Allergies.   FAMILY HISTORY:  Mother had kidney cancer, status post resection,  diabetes mellitus.  Maternal grandmother died with pancreatic cancer.  Maternal grandfather had prostate cancer.  Father and paternal  grandmother had ulcers and grandmother died with metastatic lung cancer.   SOCIAL HISTORY:  She is married.  She has 2 children, ages 26 and 24.  She is employed at [employer].  She is a nonsmoker.  No alcohol  use.   REVIEW OF SYSTEMS:  See HPI for GI.  CONSTITUTIONAL:  No weight loss but  does have weight gain as outlined above.  CARDIOPULMONARY:  No chest  pain or shortness of breath, palpitations at this time.  GENITOURINARY:  No dysuria, hematuria.  PHYSICAL EXAMINATION:  VITAL SIGNS:  Weight 279, temperature 98.8, blood  pressure 120/70, pulse 60.  GENERAL:  A pleasant, obese, Caucasian female in no acute distress.  SKIN:  Warm and dry.  No jaundice.  HEENT:  Sclerae are nonicteric.  Oropharyngeal mucosa is moist and pink.  No lesions, erythema or exudate.  No lymphadenopathy, thyromegaly.  CHEST:  Lungs are clear to auscultation.  CARDIAC:  Reveals a regular rate and rhythm.  Normal S1, S2.  No  murmurs, rubs, or gallops.  ABDOMEN:  Positive bowel sounds, obese but symmetrical, soft,  nondistended, some mild epigastric tenderness to deep palpation.  No  rebound tenderness or guarding.  No abdominal bruits or hernias.  No  abdominal masses.  EXTREMITIES:  No edema.   IMPRESSION:  96. A 35 year old lady with recurrent epigastric discomfort and pain      underneath both ribs associated with poorly controlled       gastroesophageal reflux disease symptoms.  She needs to go back on      her proton pump inhibitor therapy.  2. She also has a history of constipation dominant irritable bowel      syndrome.  This is stable at this time.  She does have difficulty      with straining and some intermittent blood per rectum.  She does      not want to try any new medications with regards to this.   PLAN:  1. Prevacid 30 mg daily, #16 samples provided.  If this helps, we will      call in a prescription.  2. I also want her to take Probiotic daily.  3. If she has had no improvement in her symptoms in one month, we will      consider further workup.  4. If she has progressive dysphagia, consider UGI.  5. Progress report in one month.   Further recommendations to follow.       Tana Coast, P.A.  Electronically Signed     Kassie Mends, M.D.  Electronically Signed    LL/MEDQ  D:  01/19/2007  T:  01/19/2007  Job:  [id number]   cc:   [name]

## 2010-07-12 NOTE — Letter (Signed)
November 20, 2005     [name], MD,FACC  [address]  [place], [place] [address]   RE:  [name], [name]  MRN:  [id number]  /  DOB:  12/31/1975   Dear [name],   It was a pleasure to see Sandra [name] today because of her palpitations.   She is a 35 year old, married, mother of two who has a diagnosis of  fibromyalgia made about a year or two ago by Dr. [name].   She has a remote history of syncope. She has a 5-year history of excessive  diaphoresis, a 2-year history of excessive fatigue, but over the last 3  months these symptoms have become much worse and have been accompanied by  episodes of nocturnal dyspnea as well as exertional dyspnea.   Her diaphoresis is always worse when she is active and sometimes occurs even  when she is sitting, stress also makes it worse.   Her fatigue is quite notable. She has daytime somnolence and a.m. fatigue.  It is not clear whether she has been evaluated for obstructive sleep apnea.   In addition to the above, she has also had problems with palpitations that  have accompanied the diaphoresis. Apparently here CardioNet  monitor showed  heart rates in the 130s to 160s that appeared to be sinus associated with  exertion. Because of this, you started her on atenolol a couple of weeks  ago, initially at 50 and then up to 75. This has been associated with a  marked improvement in her heart rate but no significant change in sweating,  presyncope, or lightheadedness.   Because of the above symptoms as well as atypical chest pain, she was also  evaluated cardiovascularly. She was supposed to have a CT scan, maybe it was  done, I cannot really tell. A VQ scan was done subsequently that was normal.  She underwent catheterization that also turned out to be normal.   PAST MEDICAL HISTORY:  1. GE reflux disease with an esophageal stricture that was dilated most      recently about 15 months ago.  2. Borderline hypertension.  3.  Anxiety/depression.  4. Obesity - chronic.  5. Joint pain.   PAST SURGICAL HISTORY:  1. Hysterectomy.  2. Bladder repair.   In fact, the hysterectomy is what marks, in the patient's mind, the onset of  her fatigue and diaphoresis. It should be noted that she had hormones  checked recently by her gynecologist and they were apparently okay.   MEDICATIONS:  Currently atenolol at 75. She is allergic to VICODIN and  LATEX.   SOCIAL HISTORY:  She is married, she has 2 children, 5 and 10. Her mother  has become very involved in her care as she works during the day at [employer]  which is quite stressful with the upcoming merger. She denies depression as  a primary component, although she recognizes it as a potential secondary  component.   PHYSICAL EXAMINATION:  VITAL SIGNS:  On examination today, her blood  pressure was 105/64 with a pulse of 75. Interestingly, she had significant  hypertension with standing with a blood pressure of 145/98 at 0 minutes,  150/103 at 5 minutes. The heart rate increased mildly from 75 to 86.  HEENT:  Demonstrated no icterus or xanthoma. The neck veins were flat. The  carotids were brisk and full bilaterally without bruits. Back was without  kyphosis or scoliosis.  LUNGS:  Clear.  HEART:  Sounds were  regular without murmurs or gallop.  ABDOMEN:  Soft with active bowel sounds without midline pulsation or  hepatomegaly.  EXTREMITIES:  Femoral pulses were 2+, distal pulses were intact. There is no  cyanosis, clubbing or edema.  NEUROLOGIC:  Grossly normal.  SKIN:  Warm and dry today.   Her electrocardiogram was normal.   Her inspiratory/expiratory ratio was about 1.2. Repeat attempts at Valsalva  could not be adequately accomplished to look for heart rate changes in that  context.   IMPRESSION:  1. Orthostatic hypertension.  2. Palpitations much improved on atenolol associated with exertion.  3. Orthostatic lightheadedness and presyncope.  4. Abnormal  diaphoresis.  5. Obesity.  6. Question sleep disordered breathing.   [name], I am not quite sure what is [name]' primary issue. Partly her  heart rate has been significantly controlled your interventions. 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   Her systolic hypertension is curious to me; I will need to explore this a  little bit but I wonder whether the __________ from her POTS does not offer  [place] a hypothetical mechanism.   I wonder also whether sleep disordered breathing may not be contributing to  her fatigue and whether a sleep study might not be in order.   I have suggested that she look at the POTS place and the [employer] web sites to  see to what degree her symptoms resonate with other people who have these  diagnoses. The diaphoresis component is something that is a little bit  unfamiliar to me to the degree that she describes, although diaphoresis is  clearly part of these dysautonomic syndromes.   [name], thanks very much for asking me to see her. If there is anything further  I can do to help in her care, please do not hesitate to let me know.   Sincerely,      [name], MD, [facility]   SCK/MedQ  DD:  11/20/2005  DT:  11/22/2005  Job #:  [id number]   CC:    [name]

## 2010-07-12 NOTE — Op Note (Signed)
NAMESHENITA, TREGO            ACCOUNT NO.:  [id number]   MEDICAL RECORD NO.:  [id number]          PATIENT TYPE:  AMB   LOCATION:  DAY                           FACILITY:  [facility]   PHYSICIAN:  Lionel December, M.D.    DATE OF BIRTH:  11/21/1975   DATE OF PROCEDURE:  12/18/2004  DATE OF DISCHARGE:                                 OPERATIVE REPORT   PROCEDURE:  Esophagogastroduodenoscopy with esophageal dilation.   INDICATION:  Sandra [name] is 35 year old Caucasian female with long history of  reflux esophagitis who presents with intermittent epigastric pain,  regurgitation, as well as heartburn. She also complains of solid food  dysphagia. Since she was begun on Zegerid she has noted resolution of her  heartburn, but not her other symptoms. Her last EGD was in November 2004  revealing erosive reflux esophagitis, Schatzki's ring, and a small sliding  hiatal hernia. Her H. pylori serology was negative; and her esophagus was  dilated by passing 56-French [facility] dilator.   Procedure and risks were reviewed with the patient and informed consent was  obtained   PREOP MEDICATIONS:  Cetacaine spray for oropharyngeal topical anesthesia  Demerol 50 mg IV Versed 13 mg IV.   FINDINGS:  Procedure performed in endoscopy suite. The patient's vital signs  and O2 saturations were monitored during the procedure and remained stable.  The patient was placed left lateral position and Olympus videoscope was  passed via oropharynx, without any difficulty, into esophagus.   ESOPHAGUS:  Mucosa of the esophagus was normal.  There was a fine, cheesy,  whitish exudate at body of the esophagus, suspicious for Candida  esophagitis. Brushing was taken on the way out for KOH prep. Mucosa was,  otherwise, normal. GE junction was at 37 and hiatus was 39. She had  noncritical ring which was just proximal to GE junction.   STOMACH:  It was empty and distended very well with insufflation. Folds in  the proximal  stomach are normal. Examination of the mucosa at body, antrum,  pyloric channel, as well as angularis, fundus, and cardia was normal.   DUODENUM:  Bulbar mucosa was normal. This scope was passed into the second  part of duodenum where the mucosa and folds were normal.   Endoscope was withdrawn.   Esophagus was dilated by passing 54 and 56-French Maloney dilators to full  insertion. Not much resistance was noted with the passage of these dilators.  Endoscope was passed, again, and esophageal mucosa reexamined and there was  no disruption. Endoscope was withdrawn. The patient tolerated the procedure  well.   FINAL DIAGNOSIS:  1.  Noncritical distal esophageal ring proximal to GE junction and a small      sliding hiatal hernia.  2.  Patchy coating of mucosa at body, most likely due to Candida      esophagitis. Brushing taken for KOH prep.  3.  Normal examination of stomach and first and second part of duodenum.  4.  Esophagus dilated by passing 54 and 56-French Maloney dilators.   RECOMMENDATIONS:  1.  She will continue antireflux measures and Zegerid at 40 mg p.o. q.a.m.Marland Kitchen  2.  Mycostatin suspension 500,000 units swish and swallow q.i.d. for 10      days.  3.  Upper abdominal ultrasound to make sure she does not have      cholelithiasis.      Lionel December, M.D.  Electronically Signed     NR/MEDQ  D:  12/18/2004  T:  12/18/2004  Job:  [id number]   cc:   [name]  Fax: [phone]

## 2010-07-12 NOTE — Op Note (Signed)
NAME:  Sandra [name], Sandra [name]                      ACCOUNT NO.:  [id number]   MEDICAL RECORD NO.:  [id number]                   PATIENT TYPE:  AMB   LOCATION:  DAY                                  FACILITY:  [facility]   PHYSICIAN:  Lionel December, M.D.                 DATE OF BIRTH:  09/01/1975   DATE OF PROCEDURE:  12/23/2001  DATE OF DISCHARGE:                                 OPERATIVE REPORT   PROCEDURE:  Esophagogastroduodenoscopy with esophageal dilatation, followed  by total colonoscopy.   INDICATIONS:  The patient is a 35 year old Caucasian female with chronic  GERD, with symptoms not well-controlled on therapy, who is also having solid  food dysphagia.  She also has chronic rectal bleeding.  This generally  occurs with her bowel movements.  It has been going on for over the year.  It is possible that she is bleeding from hemorrhoids but needs to be sure  that she does not have proctitis, polyps, etc.  Both the procedures were  reviewed with the patient and informed consent was obtained.   PREMEDICATION:  Cetacaine spray for pharyngeal topical anesthesia, Demerol  50 mg IV, Versed 8 mg IV in divided dose.   INSTRUMENT USED:  Olympus video system.   FINDINGS:  Procedures performed in endoscopy suite.  The patient's vital  signs and O2 saturation were monitored during the procedure and remained  stable.   PROCEDURE:  Esophagogastroduodenoscopy.   DESCRIPTION OF PROCEDURE:  The patient was placed in the left lateral  recumbent position, and the endoscope was passed via the oropharynx without  any difficulty into the esophagus.   Esophagus:  Mucosa of the esophagus normal throughout.  The squamocolumnar  junction was unremarkable, and there was a small sliding hiatal hernia.   Stomach:  It was empty and distended very well with insufflation.  Folds of  the proximal stomach were normal.  Examination of the mucosa revealed two  erosion at gastric body along the posterior wall, but  no ulcer crater was  found.  The pyloric channel was patent.  Angularis and fundus examined by  retroflexing the scope and were normal.   Duodenum:  Examination of the bulb and second portion of the duodenum was  normal.   The endoscope was withdrawn.   Esophageal dilatation was performed by passing 56 [place] Maloney dilator.  Post-dilatation examination of the esophagus did not reveal any mucosal  injury.  The endoscope was withdrawn.  The patient was prepared for  procedure #2.   PROCEDURE:  Colonoscopy.   DESCRIPTION OF PROCEDURE:  Rectal examination performed.  This was within  normal limits.  The scope was placed in the rectum and advanced through the  rectum and sigmoid colon and beyond.  Preparation was excellent, except she  had some stool in her right colon.  Cecum was identified by the ileocecal  valve and appendiceal orifice.  Pictures taken for the record.  Her terminal  ileum was also examined and was normal.  The scope was withdrawn.  Colonic  mucosa was once again carefully examined.  It was normal.  The rectal mucosa  was normal.  The scope was retroflexed to examine the anorectal junction,  and moderate-sized hemorrhoids were noted below the dentate line.  The  endoscope was straightened and withdrawn.  The patient tolerated the  procedure well.   FINAL DIAGNOSES:  1. Small sliding hiatal hernia.  No endoscope evidence of reflux     esophagitis.  2. Erosive gastritis.  3. Moderate external hemorrhoids, otherwise normal colonoscopy and terminal     ileoscopy.   RECOMMENDATIONS:  She will continue antireflux measures as before.  Will  start on Citrucel or equivalent, one tablespoonful daily, and Rowasa  suppository one per rectum at bedtime for one month.  H. pylori serology  will be checked today.  I have asked the patient to keep a written record as  to the frequency of rectal bleeding and will see her in the office in six to  eight weeks.                                                Lionel December, M.D.    NR/MEDQ  D:  12/23/2001  T:  12/23/2001  Job:  [id number]   cc:   Dr. Alison Murray, [place]

## 2010-07-12 NOTE — H&P (Signed)
Sandra [name], Sandra [name]            ACCOUNT NO.:  [id number]   MEDICAL RECORD NO.:  [id number]          PATIENT TYPE:   LOCATION:                                 FACILITY:   PHYSICIAN:  Lionel December, M.D.    DATE OF BIRTH:  22-Mar-1975   DATE OF ADMISSION:  12/05/2004  DATE OF DISCHARGE:  LH                                HISTORY & PHYSICAL   CHIEF COMPLAINT:  Acid reflux, abdominal pain, mucus in blood and stool.   HISTORY OF PRESENT ILLNESS:  The patient is a 35 year old, Caucasian female  with history of gastroesophageal reflux disease and IBS with constipation  who presents today for further evaluation of the above-stated symptoms.  We  last saw her in November 2005.  At that time, she was having abdominal pain  and underwent CT evaluation.  CT was unremarkable.  She also had a barium  pill esophagram for dysphagia which was normal.  Over the last 3 months, she  has been having progressive epigastric burning and acid regurgitation and  heartburn.  Symptoms are worse postprandially and when she lays down at  night.  She has been using over-the-counter antacids.  She has not been on  any prescription medications.  She has been on Relafen since April 2006, for  body aches and pains felt to be due to fibromyalgia.  She has been followed  by Dr. Kellie Simmering more recently who has been doing injections in her hips.  She  uses Relafen just as needed and usually once every 1-2 weeks.  She denies  any dysphagia or odynophagia.  She has vomited twice after eating hot dogs.  She complains of lower abdominal cramping and constipation.  She is having a  bowel movement once every 1-2 weeks.  She has passed some mucus in her  stools and passing hard, small balls.  She has seen fresh blood on the  toilet tissue twice which she feels is from hemorrhoids.  She has been  increasing her water intake and had been having a little more frequent bowel  movements more recently.  Denies any melena.  She is  concerned because of  family history of cancer.  She also has a family history of peptic ulcer  disease.   CURRENT MEDICATIONS:  1.  Meclizine one daily as needed.  2.  Over-the-counter antacids p.r.n.  3.  Benadryl p.r.n.  4.  Relafen 500 mg b.i.d. p.r.n.   ALLERGIES:  VICODIN causes hives.   PAST MEDICAL HISTORY:  1.  Allergies.  2.  Gastroesophageal reflux disease.  3.  Chronic constipation.  4.  History of duodenal ulcer per patient.  5.  Her last EGD was in November 2004, at which time she had erosive reflux      esophagitis with Schatzki ring and small sliding hiatal hernia.  A 56      French Maloney dilator was passed, but the ring had to be disrupted with      focal biopsies.  She also had erosive antral gastritis, but prior H.      pylori serologies were negative.   PAST SURGICAL  HISTORY:  1.  Tubal ligation.  2.  Hysterectomy for irregular vaginal bleeding.  3.  Recent gum surgery due to cyst which was involving the sinus cavity in      August 2006.   FAMILY HISTORY:  Mother has kidney cancer and diabetes mellitus.  Maternal  grandmother died of pancreatic cancer.  Maternal grandfather has prostate  cancer.  Father and paternal grandmother has ulcers.  Her grandmother  recently died of metastatic lung cancer.   SOCIAL HISTORY:  She is married and has two children.  She is employed with  [employer] as a Engineer, materials.  She has never been a smoker.  Denies any  alcohol use.   REVIEW OF SYSTEMS:  GASTROINTESTINAL:  See HPI.  CONSTITUTIONAL:  She has  gained 10 pounds since we last saw her.  CARDIOPULMONARY:  No chest pain or  shortness of breath.   PHYSICAL EXAMINATION:  VITAL SIGNS:  Weight 264, temperature 97.8, blood  pressure 118/78, pulse 96.  GENERAL:  Pleasant, obese, Caucasian female in no acute distress.  SKIN:  Warm and dry, no jaundice.  HEENT:  Pupils equal round and reactive to light.  Conjunctivae are pink.  Sclerae nonicteric.  Oropharyngeal  mucosa moist and pink.  No lesions,  erythema or exudate.  No lymphadenopathy or thyromegaly.  CHEST:  Lungs clear to auscultation.  CARDIAC:  Regular rate and rhythm, normal S1, S2.  No murmurs, rubs or  gallops.  ABDOMEN:  Positive bowel sounds, obese, but symmetrical.  Soft.  She has  moderate epigastric tenderness to deep palpation.  No organomegaly or  masses.  No rebound tenderness or guarding.  No abdominal bruits or hernias.  EXTREMITIES:  No edema.   IMPRESSION:  Gastroesophageal reflux disease with epigastric pain.  Suspect  she may have reflux esophagitis.  It is concerning that she is taking  Relafen given history of peptic ulcer disease.  We could consider a trial of  proton pump inhibitor therapy, although the patient would very much like to  have her upper gastrointestinal tract reevaluated which is reasonable given  her current symptoms.  In addition, she continues to have chronic  constipation and would like to try a new therapy for this.  She is trying  dietary modifications with modest results at this point.   PLAN:  1.  EGD with Dr. Karilyn Cota in the near future.  2.  Trial of Amitiza 24 mcg p.o. daily x3 days, then increase to b.i.d.,      take with food, 2 weeks samples given.      Tana Coast, P.A.      Lionel December, M.D.  Electronically Signed    LL/MEDQ  D:  12/05/2004  T:  12/05/2004  Job:  [id number]

## 2010-07-12 NOTE — H&P (Signed)
NAME:  Sandra [name], Sandra [name] NO.:  [id number]   MEDICAL RECORD NO.:  [id number]                   PATIENT TYPE:   LOCATION:                                       FACILITY:   PHYSICIAN:  Lionel December, M.D.                 DATE OF BIRTH:  07-03-1975   DATE OF ADMISSION:  12/15/2002  DATE OF DISCHARGE:                                HISTORY & PHYSICAL   CHIEF COMPLAINT:  Problem swallowing.   HISTORY OF PRESENT ILLNESS:  Sandra [name] is a 35 year old Caucasian female who  presents today with complaints of dysphagia over the last four weeks.  She  underwent EGD on December 24, 2002, for chronic GERD with poorly controlled  symptoms as well as solid dysphagia.  Endoscopy revealed small sliding  hiatal hernia, erosive gastritis.  H. pylori serologies were negative.  She  had empiric dilation with 56-French [facility] dilator with no evidence of  mucosal injury.  The patient states dilatation helped her extremely well.  She did very well up until one month ago and started having problems  swallowing solid foods again.  She feels food become lodged in her  esophagus.  She will try to wash it down with liquids, but then the liquids  will come back up, and eventually she vomits the food.  She complains of  odynophagia and epigastric pain.  She is having heartburn symptoms despite  the Nexium.  She tried some of her son's Prevacid tablets which seemed to  help her heartburn symptoms.  She continues to have abdominal cramps and  constipation, although her bowels are moving more regularly than usual.  She  generally has a bowel movement every three to four days.  Her weight is up  10 pounds since we last saw her.   CURRENT MEDICATIONS:  1. Stool softener 2 daily.  2. Allergy shots, 2 per week.  3. Nexium 40 mg daily.  4. Ibuprofen 200 mg 2 to 12 tablets daily, using for headaches and abdominal     cramp.   ALLERGIES:  No known drug allergies.   PAST MEDICAL HISTORY:  1. Allergies.  2. Gastroesophageal reflux disease.  3. Chronic constipation.  4. History of duodenal ulcer, but on EGD in 1993, she had antral gastritis     and duodenitis but no peptic ulcer disease.   PAST SURGICAL HISTORY:  1. Tubal ligation.  2. Hysterectomy for irregular vaginal bleeding.   FAMILY HISTORY:  Mother has kidney cancer.  Maternal grandmother died of  pancreatic cancer.  Maternal grandfather was diagnosed with prostate cancer,  has undergone radiation therapy.  No family history of colorectal cancer to  her knowledge.   SOCIAL HISTORY:  She is married.  She has two children.  She is employed  with [employer] as a Engineer, materials.  She has never been a smoker, denies  any alcohol use.   REVIEW OF SYSTEMS:  Please see HPI for GI.  GENERAL:  Has gained 10 pounds  since we last saw her.  CARDIOPULMONARY:  Denies any chest pain, shortness  of breath.   PHYSICAL EXAMINATION:  VITAL SIGNS:  Weight 244-1/2.  Blood pressure 130/92,  pulse 70.  GENERAL:  Pleasant, moderately obese, Caucasian female in no acute distress.  SKIN: Warm and dry, no jaundice.  HEENT:  Conjunctivae pink.  Sclerae nonicteric.  Oropharyngeal mucosa pink  and moist, no lesions, erythema, or exudate.  CHEST:  Lungs clear to auscultation.  CARDIAC:  Regular rate and rhythm.  Normal S1, S2.  No murmurs, rubs, or  gallops.  ABDOMEN:  Positive bowel sounds.  Obese by symmetrical. Soft, nondistended.  She has mild epigastric tenderness to deep palpation.  No organomegaly or  masses.  EXTREMITIES:  No edema.   IMPRESSION:  1. Solid food dysphagia with apparent episodes of esophageal food     impactions.  2. Chronic gastroesophageal reflux disease symptoms, poorly controlled.     Somewhat concerning is significant ibuprofen use.  3. Constipation, probable irritable bowel syndrome.  Bowels are moving     regularly, but she is complaining of abdominal cramping for which she has     taken significant  ibuprofen.   PLAN:  1. EGD with dilatation in the near future.  2. Will switch her from Nexium to Prevacid soluble tablets.  Prescription     written for #31, take one p.o. daily with 11 refills.  We provided her     with rebate coupons as well as #20 Prevacid 30 mg capsules.  3. Fiber of choice 2 tablets daily.  4. Trial of NuLev 1 sublingual q.a.c. q.h.s. p.r.n., #18 samples given.       _____________________________________  ___________________________________________  Tana Coast, P.A.                      Lionel December, M.D.   LL/MEDQ  D:  12/15/2002  T:  12/15/2002  Job:  [id number]

## 2010-07-12 NOTE — Op Note (Signed)
Sandra [name], Sandra [name]            ACCOUNT NO.:  [id number]   MEDICAL RECORD NO.:  [id number]          PATIENT TYPE:  INP   LOCATION:  3708                         FACILITY:  [facility]   PHYSICIAN:  Veverly Fells. Excell Seltzer, MD  DATE OF BIRTH:  [birth year]   DATE OF PROCEDURE:  11/11/2005  DATE OF DISCHARGE:                                 OPERATIVE REPORT   PROCEDURE:  1. Left heart catheterization.  2. Right heart catheterization.  3. Selective coronary angiogram.  4. Left ventricular angiography.   INDICATIONS:  Sandra [name] is a very pleasant, 35 year old woman who has  marked dyspnea as well as cardiac palpitations, and chest pain. She has  undergone a noninvasive evaluation that has been unremarkable.  However, she  has had persistent symptoms; and was subsequently referred for cardiac  catheterization to evaluate her hemodynamics and coronary anatomy.   ACCESS:  Right femoral artery and right femoral vein.   COMPLICATIONS:  None.   PROCEDURAL DETAILS:  Risks and indications of the procedure were explained  to the patient.  Informed consent was obtained.  The right groin was  prepped, draped, and anesthetized with 1% lidocaine in a normal sterile  fashion.  Using the modified Seldinger technique, a 6-French arterial sheath  was placed in the right femoral artery and a 7-French venous sheath was  placed in the right femoral vein.  A Swan-Ganz catheter was used to perform  the right heart catheterization.  Initially a superior vena cava saturation  was drawn.  Hemodynamics measurements were obtained in the right atrium,  right ventricle, pulmonary artery and pulmonary capillary wedge position.  Oxygen saturations were also drawn in the pulmonary artery and the aorta.  Simultaneous pressure recording was performed in the left ventricle and  pulmonary capillary wedge position.  Following hemodynamic measurements a 30-  degree RAO.  A left ventriculogram was performed.  After the  ventriculogram  a pullback was performed across the aortic valve.  Following left  ventriculography, selective coronary angiography was done.  A 6-French, JL-4  catheter was used for the left coronary artery and a 6-French, JR-4 catheter  was used for the right coronary artery.  Multiple angiographic views were  taken of the left coronary artery and a single view was taken of the right  coronary artery.   FINDINGS:  1. Right atrial pressure is 11/8 with a mean of 6.  That is A wave 11, V      wave 8, mean pressure 6.  2. Right ventricular pressure is 33/1 with an end-diastolic pressure of 8.  3. Pulmonary artery pressure is 34/16 with a mean of 23.  4. Pulmonary capillary wedge pressure has an A wave of 13, V wave of 16,      and a mean pressure of 10.  5. Left ventricular pressure shows a pressure of 120/2, with an end-      diastolic pressure of 12, and aortic pressure is 120/74 with a mean      pressure of 97.  6. Oxygen saturations are as follows:  SVC 65%, right pulmonary artery  74%, proximal aorta 95%.  7. Fick cardiac output is 6.0 liters per minute.  Cardiac index is 2.6      liters per minute per meter squared.   SELECTIVE CORONARY ANGIOGRAPHY:  1. The left mainstem is normal. It trifurcated into the LAD, left      circumflex, and ramus intermedius.  The LAD is a large diameter that      courses down to the left ventricular apex.  It gives off a single      proximal diagonal that is normal.  The LAD is normal throughout its      entire course.  2. Ramus intermedius bifurcates into twin vessels.  It is angiographically      normal.  It is of medium caliber.  3. The left circumflex courses down and gives off a large obtuse marginal.      The left circumflex is normal.  4. Right coronary artery is dominant.  It bifurcates distally into the PDA      and posterior AV segment which gives off 1 significant posterolateral      branch.  There is one RV marginal from the mid  right coronary artery.      The right coronary artery is angiographically normal.   LEFT VENTRICULOGRAM:  Demonstrates normal left ventricular function with an  estimated left ventricular ejection fraction of 55%.  There are no wall  motion abnormalities.   ASSESSMENT:  1. Normal coronary arteries.  2. Normal left ventricular function.  3. Normal hemodynamics.   PLAN:  Routine post catheterization care.  I think that her dyspnea is  noncardiac based on these findings.  It is possible that she has an  arrhythmia to explain her palpations, but she clearly has no structural  heart disease.   Will plan to discharge her home tomorrow after her observation period is  complete.      Veverly Fells. Excell Seltzer, MD  Electronically Signed     MDC/MEDQ  D:  11/11/2005  T:  11/12/2005  Job:  [id number]   cc:   [name]

## 2010-07-12 NOTE — Discharge Summary (Signed)
NAMESYRAI, Sandra [name]            ACCOUNT NO.:  [id number]   MEDICAL RECORD NO.:  [id number]          PATIENT TYPE:  INP   LOCATION:  3708                         FACILITY:  [facility]   PHYSICIAN:  Veverly Fells. Excell Seltzer, MD  DATE OF BIRTH:  [birth year]   DATE OF ADMISSION:  11/11/2005  DATE OF DISCHARGE:  11/12/2005                                 DISCHARGE SUMMARY   PROCEDURES:  1. Cardiac catheterization.  2. Coronary arteriogram.  3. Left ventriculogram.  4. Right heart catheterization.   TIME AT DISCHARGE:  27 minutes.   PRIMARY DIAGNOSES:  1. Chest pain, no significant coronary artery disease by cath and      pulmonary embolism workup at [facility] was negative.  2. Sinus tachycardia, continued beta blocker.  3. Obesity.  4. Allergy or intolerance to VICODIN.  5. Anxiety and depression.  6. Borderline hypertension.  7. History of esophageal stricture and dilatation.  8. Gastroesophageal reflux disease.  9. Status post bladder repair.  10.Status post hysterectomy.  11.History of joint pain.  12.Fibromyalgia.  13.Palpitations.   HOSPITAL COURSE:  [name] is 35 year old female with no previous  history of coronary artery disease.  She was seen in the office for  evaluation of persistent dyspnea and palpitations with a CardioNet monitor  showing sinus tachycardia between 130 and 160 beats per minute.  She was  started on atenolol and is to follow up with Dr. Sherryl Manges to rule out  POTS syndrome.  A D-dimer was abnormal, and a CT scan CT scan could not be  done, but a VQ scan was performed which was normal.  She was evaluated by  cardiology, and it was felt that cardiac catheterization was indicated, and  she was transferred to [facility].   The cardiac catheterization was performed with a right and left heart  catheterization.  Her cardiac output was 6.0 by the Fick method, and cardiac  index by the Fick method was 2.6 liters.  Her pulmonary artery pressures  were within normal limits at 34/16, and her wedge was 10.  Her coronaries  were normal, and her EF was 55%.   The next day, her groin was clear and her distal pulses were intact.  She  was evaluated by Dr. Excell Seltzer and considered stable for discharge with  outpatient follow-up arranged.   DISCHARGE INSTRUCTIONS:  1. Her activity level is to be decreased for 2 days, and she is not to      lift anything for 2 weeks.  2. She is to call our office for any problems with the cath site.  3. She is to follow up with Dr. Andee Lineman on November 27, 2005 at 2:10 and with      Dr. Graciela Husbands as scheduled.  She is to followup with Dr. Neita Carp as needed.   DISCHARGE MEDICATIONS:  1. Atenolol 50 mg a day.  2. Cymbalta 60 mg a day.  3. Elavil 37.5 mg a day.     ______________________________  Theodore Demark, PA-C      Veverly Fells. Excell Seltzer, MD  Electronically Signed    RB/MEDQ  D:  11/12/2005  T:  11/13/2005  Job:  [id number]   cc:   [facility]  [name]

## 2010-07-12 NOTE — Op Note (Signed)
NAME:  Sandra [name], Sandra [name]                      ACCOUNT NO.:  [id number]   MEDICAL RECORD NO.:  [id number]                   PATIENT TYPE:  AMB   LOCATION:  DAY                                  FACILITY:  [facility]   PHYSICIAN:  Lionel December, M.D.                 DATE OF BIRTH:  1975/11/15   DATE OF PROCEDURE:  01/04/2003  DATE OF DISCHARGE:                                 OPERATIVE REPORT   PROCEDURE:  Esophagogastroduodenoscopy with esophageal dilatation.   INDICATIONS:  Sandra [name] is a 35 year old Caucasian female with a history of  chronic GERD with poorly-controlled symptoms, who has been experiencing  dysphagia for the last two months.  It is primarily with solids, but at  times she has had difficulty with liquids.  She did have an EGD and ED one  year ago when she was noted to have a small sliding hiatal hernia.  The  esophagus was dilated by passing a 56 [place] Maloney dilator with  symptomatic improvement until about two months ago.  She recently had an  episode of chest pain and passed out, and it is suspected that this could  have been esophageal.  She is also being evaluated by Dr. Reche Dixon for  hypoglycemia and had her insulin level, C-peptide, and proinsulin levels  drawn this morning along with a MET-7.  We will send results to Dr. Reche Dixon.   The procedure was reviewed with the patient and informed consent was  obtained.   PREMEDICATION:  Cetacaine spray for pharyngeal topical anesthesia, fentanyl  50 mcg IV, Versed 10 mg IV.   FINDINGS:  Procedure performed in endoscopy suite.  The patient's vital  signs and O2 saturation were monitored during procedure and remained stable.  The patient was placed in the left lateral recumbent position and the  Olympus video scope was passed via the oropharynx without any difficulty  into the esophagus.   Esophagus:  The mucosa of the proximal and middle third was normal.  Distally she has a single erosion just proximal to the GE junction  as well  as a Schatzki's ring.  There was a 3 cm size sliding hiatal hernia.   Stomach:  It was empty and distended very well with insufflation.  Folds of  the proximal stomach were normal.  Examination of the mucosa revealed  antral/prepyloric erosions which were also seen on her last exam a year ago.  Please note her H. pylori serology at that time was negative and will not be  repeated today.  Pyloric channel was patent.  Angularis, fundus, and cardia  were normal.   Duodenum:  Examination of the bulb and postbulbar duodenum was normal.  The  endoscope was withdrawn.   Esophagus was dilated by passing 56 [place] Maloney dilator to full  insertion.  As the scope was passed again, the ring was intact and therefore  it was disrupted with a focal biopsy at three different  sites.  No tissue  was saved for histology.  The endoscope was withdrawn.  The patient  tolerated the procedure well.   FINAL DIAGNOSES:  1. Erosive reflux esophagitis with Schatzki's ring and small sliding hiatal     hernia.  2. Esophagus was dilated by passing a 56 [place] Maloney dilator but the ring     was still intact and disrupted with focal biopsy.  3. Erosive antral gastritis, Helicobacter pylori serology a year ago was     negative.   RECOMMENDATIONS:  1. Antireflux measures reinforced.  2. She also needs to make an effort to lose some weight.  3. Will increase her Prevacid SoluTab to 30 mg b.i.d. for eight weeks, after     which she will be brought back to 30 mg p.o. q.a.m.  4. I would like to see her back in the office in four months from now.      ___________________________________________                                            Lionel December, M.D.   NR/MEDQ  D:  01/04/2003  T:  01/04/2003  Job:  [id number]   cc:   Jonita Albee, [place] Dr. Reche Dixon, [facility]

## 2010-07-12 NOTE — Consult Note (Signed)
NAME:  Sandra [name], Sandra [name]                        ACCOUNT NO.:  [id number]   MEDICAL RECORD NO.:  [id number]                  PATIENT TYPE:   LOCATION:                                       FACILITY:   PHYSICIAN:  Lionel December, M.D.                 DATE OF BIRTH:   DATE OF CONSULTATION:  12/03/2001  DATE OF DISCHARGE:                                   CONSULTATION   REASON FOR CONSULTATION:  Constipation, rectal bleeding.   HISTORY OF PRESENT ILLNESS:  The patient is a 35 year old Caucasian female,  patient of Dr. Mora Appl, who presents today for further evaluation of the  above-stated symptoms.  She has had constipation most of her life.  Symptoms  have been somewhat worse over the last year.  She has been having a bowel  movement only once every one to two weeks.  Her stools are often hard.  She  has been tried on MiraLax for a two-month period of time with no improvement  in her bowel movements.  She is currently on Citrucel caplets daily and two  stool softeners daily.  She continues to have a bowel movement only once  every one to two weeks.  When she takes a laxative, such as Ex-Lax, her  abdomen cramps, but she does not have a bowel movement.  She has also had a  lot of hematochezia with defecation.  She has been having bleeding for at  least the last one year.  She was having vaginal bleeding at the same time  every time she had a bowel movement, but now that she has had a  hysterectomy, she continues to have just rectal bleeding.  She has been on a  high-fiber diet.  She drinks at least 10 cups of non-caffeinated fluid  daily.  She was on a weight loss program through Dr. Madalyn Rob office and was  able to lose about 40 pounds.  She complains of lower abdominal discomfort  related to the constipation.  She also has a long history of  gastroesophageal reflux disease.  She is on Nexium b.i.d. as treated by Dr.  Ferd Glassing previously.  She has not been on the medication  consistently  recently, though.  She has had a lot of heartburn as well as nausea and  dysphagia to solid foods.  Denies any hematemesis.   Within the last several months she had a TSH of 1.54.  Calcium 8.8.  Previously, Dr. Karilyn Cota did an EGD on her in 1993.  This was for a history of  duodenal ulcer with persistent epigastric pain.  Findings were antral  gastritis and duodenitis but no peptic ulcer disease.  Her H. pylori status  is unknown.   CURRENT MEDICATIONS:  1. Stool softener q.d.  2. Citrucel caplets 3 daily.  3. Allergy shots 2 per week.   PAST MEDICAL HISTORY:  1. Allergies.  2. Gastroesophageal reflux disease.  3. Chronic  constipation.   PAST SURGICAL HISTORY:  1. Tubal ligation.  2. Recent hysterectomy five weeks ago for irregular vaginal bleeding.   FAMILY HISTORY:  Mother was diagnosed with kidney cancer two weeks ago.  Maternal grandmother died of pancreatic cancer.  No family history of  colorectal cancer.   SOCIAL HISTORY:  She has been married for seven years, has two children.  She is employed with [employer] as a Engineer, materials.  She has never been a  smoker.  Denies alcohol use.   REVIEW OF SYSTEMS:  Please see HPI for GI and for GENERAL.  CARDIOPULMONARY:  Denies any chest pain or shortness of breath.  GENITOURINARY:  Recently had  a hysterectomy for irregular vaginal bleeding.  Denies any dysuria.   PHYSICAL EXAMINATION:  VITAL SIGNS:  Weight 234, height 5 feet 7 inches.  Temperature 97.9, blood pressure 100/72, pulse 70.  GENERAL:  Pleasant, moderately obese Caucasian female in no acute distress.  SKIN:  Warm and dry.  No jaundice.  HEENT:  Conjunctivae pink.  Sclerae nonicteric.  Oropharyngeal mucosa moist  and pink.  No lesions, erythema, or exudate.  NECK:  No lymphadenopathy, thyromegaly, carotid bruits.  CHEST:  Lungs are clear to auscultation.  CARDIAC:  Regular rate and rhythm.  Normal S1, S2.  No murmurs, rubs, or  gallops.  ABDOMEN:   Positive bowel sounds.  Obese but symmetrical.  Soft,  nondistended.  She has mild epigastric tenderness to deep palpation.  No  organomegaly or masses.  RECTAL:  Small anterior external hemorrhoid, nonthrombosed, nonbleeding.  No  masses in the rectal vault.  Secretions are Hemoccult-negative.  EXTREMITIES:  No edema.   IMPRESSION:  The patient is a pleasant 35 year old lady who has a history of  chronic constipation which has been unresponsive to fiber and laxatives.  She has also had hematochezia felt to be secondary from a benign anorectal  source.  Bleeding has been persistent over the last one year.  I suspect  that she has functional constipation with hemorrhoidal bleed or bleeding  from a fissure.  Her TSH and calcium are normal.  She also has chronic  gastroesophageal reflux disease with poorly controlled symptoms.  Used to  require b.i.d. proton pump inhibitor therapy.  She is not on any regular  therapy at this time.  She has developed some dysphagia to solid foods.  She  may have a peptic stricture, however, it may simply be secondary to poorly  controlled gastroesophageal reflux disease.   PLAN:  1. Colonoscopy, EGD with possible dilatation in the near future.  2. Begin Nexium 40 mg p.o. q.d., #7 samples, and prescription for #31 with     three refills given.  3. Trial of Zelnorm 6 mg p.o. b.i.d. 30 minutes before meals, #20 samples     given.  4. Further recommendations to follow.   I would like to thank Dr. Mora Appl for allowing [place] to take part in the care of  this patient.     Tana Coast, P.A.                        Lionel December, M.D.    LL/MEDQ  D:  12/03/2001  T:  12/05/2001  Job:  [id number]

## 2010-08-02 ENCOUNTER — Encounter: Payer: Self-pay | Admitting: Gastroenterology

## 2010-11-25 LAB — COMPREHENSIVE METABOLIC PANEL
ALT: 22
AST: 23
Albumin: 3.5
Alkaline Phosphatase: 84
BUN: 9
CO2: 28
Calcium: 9.1
Chloride: 104
Creatinine, Ser: 1.02
GFR calc Af Amer: >60
GFR calc non Af Amer: >60
Glucose, Bld: 96
Potassium: 4.4
Sodium: 137
Total Bilirubin: 0.7
Total Protein: 6.5

## 2010-11-25 LAB — CBC
HCT: 39.4
Hemoglobin: 13.3
MCHC: 33.7
MCV: 88.5
Platelets: 177
RBC: 4.45
RDW: 12.9
WBC: 4.7

## 2010-11-25 LAB — DIFFERENTIAL
Basophils Absolute: 0
Basophils Relative: 0
Eosinophils Absolute: 0.2
Eosinophils Relative: 4
Lymphocytes Relative: 32
Lymphs Abs: 1.5
Monocytes Absolute: 0.3
Monocytes Relative: 7
Neutro Abs: 2.7
Neutrophils Relative %: 57

## 2010-11-25 LAB — URINALYSIS, ROUTINE W REFLEX MICROSCOPIC
Bilirubin Urine: NEGATIVE
Glucose, UA: NEGATIVE
Hgb urine dipstick: NEGATIVE
Ketones, ur: NEGATIVE
Nitrite: NEGATIVE
Protein, ur: NEGATIVE
Specific Gravity, Urine: 1.009
Urobilinogen, UA: 0.2
pH: 6

## 2010-11-29 LAB — PREGNANCY, URINE: Preg Test, Ur: NEGATIVE

## 2010-11-29 LAB — BASIC METABOLIC PANEL
BUN: 10 mg/dL (ref 6–23)
CO2: 27 mEq/L (ref 19–32)
Calcium: 9.3 mg/dL (ref 8.4–10.5)
Chloride: 105 mEq/L (ref 96–112)
Creatinine, Ser: 0.86 mg/dL (ref 0.4–1.2)
GFR calc Af Amer: >60 mL/min (ref >60)
GFR calc non Af Amer: >60 mL/min (ref >60)
Glucose, Bld: 104 mg/dL — ABNORMAL HIGH (ref 70–99)
Potassium: 4 mEq/L (ref 3.5–5.1)
Sodium: 140 mEq/L (ref 135–145)

## 2010-11-29 LAB — HEMOGLOBIN AND HEMATOCRIT, BLOOD
HCT: 38.5 % (ref 36.0–46.0)
Hemoglobin: 12.9 g/dL (ref 12.0–15.0)

## 2011-09-01 ENCOUNTER — Encounter: Payer: Self-pay | Admitting: Cardiology

## 2011-11-27 ENCOUNTER — Other Ambulatory Visit: Payer: Self-pay | Admitting: Speech-Language Pathologist

## 2011-11-27 ENCOUNTER — Other Ambulatory Visit (HOSPITAL_COMMUNITY): Payer: Self-pay | Admitting: Unknown Physician Specialty

## 2011-11-27 DIAGNOSIS — Z139 Encounter for screening, unspecified: Secondary | ICD-10-CM

## 2011-12-02 ENCOUNTER — Ambulatory Visit (HOSPITAL_COMMUNITY)
Admission: RE | Admit: 2011-12-02 | Discharge: 2011-12-02 | Disposition: A | Discharge status: Home / Self Care | Payer: 59 | Source: Ambulatory Visit | Attending: Unknown Physician Specialty | Admitting: Unknown Physician Specialty

## 2011-12-02 DIAGNOSIS — Z1231 Encounter for screening mammogram for malignant neoplasm of breast: Secondary | ICD-10-CM | POA: Insufficient documentation

## 2011-12-02 DIAGNOSIS — Z139 Encounter for screening, unspecified: Secondary | ICD-10-CM

## 2012-01-07 ENCOUNTER — Other Ambulatory Visit: Payer: Self-pay | Admitting: Orthopaedic Surgery

## 2012-01-07 DIAGNOSIS — M545 Low back pain: Secondary | ICD-10-CM

## 2012-01-07 DIAGNOSIS — M79605 Pain in left leg: Secondary | ICD-10-CM

## 2012-01-09 ENCOUNTER — Ambulatory Visit
Admission: RE | Admit: 2012-01-09 | Discharge: 2012-01-09 | Disposition: A | Discharge status: Home / Self Care | Payer: 59 | Source: Ambulatory Visit | Attending: Orthopaedic Surgery | Admitting: Orthopaedic Surgery

## 2012-01-09 VITALS — BP 105/68 | HR 84 | Ht 67.0 in | Wt 260.0 lb

## 2012-01-09 DIAGNOSIS — M79605 Pain in left leg: Secondary | ICD-10-CM

## 2012-01-09 DIAGNOSIS — M545 Low back pain: Secondary | ICD-10-CM

## 2012-01-09 MED ORDER — ONDANSETRON HCL 4 MG/2ML IJ SOLN
4.0000 mg | Freq: Once | INTRAMUSCULAR | Status: AC
  Administered 2012-01-09: 4 mg via INTRAMUSCULAR

## 2012-01-09 MED ORDER — IOHEXOL 180 MG/ML  SOLN
15.0000 mL | Freq: Once | INTRAMUSCULAR | Status: AC | PRN
  Administered 2012-01-09: 15 mL via INTRATHECAL

## 2012-01-09 MED ORDER — DIAZEPAM 5 MG PO TABS
10.0000 mg | ORAL_TABLET | Freq: Once | ORAL | Status: AC
  Administered 2012-01-09: 10 mg via ORAL

## 2012-01-09 MED ORDER — MEPERIDINE HCL 100 MG/ML IJ SOLN
75.0000 mg | Freq: Once | INTRAMUSCULAR | Status: AC
  Administered 2012-01-09: 75 mg via INTRAMUSCULAR

## 2012-01-09 NOTE — Progress Notes (Signed)
Patient states she has been off Phenergan and Prozac for at least the past two days.  Donell Sievert, RN

## 2012-04-10 ENCOUNTER — Other Ambulatory Visit: Payer: Self-pay

## 2012-07-06 ENCOUNTER — Ambulatory Visit (INDEPENDENT_AMBULATORY_CARE_PROVIDER_SITE_OTHER): Payer: BC Managed Care – PPO | Admitting: Neurology

## 2012-07-06 ENCOUNTER — Encounter: Payer: Self-pay | Admitting: Neurology

## 2012-07-06 VITALS — BP 123/80 | HR 96 | Temp 98.8°F | Ht 65.0 in | Wt 277.0 lb

## 2012-07-06 DIAGNOSIS — H539 Unspecified visual disturbance: Secondary | ICD-10-CM

## 2012-07-06 DIAGNOSIS — R51 Headache: Secondary | ICD-10-CM

## 2012-07-06 DIAGNOSIS — R42 Dizziness and giddiness: Secondary | ICD-10-CM
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
## 2012-07-06 NOTE — Patient Instructions (Addendum)
I do want to suggest a few things today:  As far as your medications are concerned, I would like to suggest  No new meds at this time.   As far as diagnostic testing: 1. Lumbar puncture, 2. Ophthalmology consult. As I want to make sure, you don't have Pseudotumor cerebri.   I would like to see you back in 2 months, sooner if we need to. Please call us with any interim questions, concerns, problems, updates or refill requests.  Please also call us for any test results so we can go over those with you on the phone. Brett Canales is my clinical assistant and will answer any of your questions and relay your messages to me and also relay most of my messages to you.  Our phone number is [phone]. We also have an after hours call service for urgent matters and there is a physician on-call for urgent questions. For any emergencies you know to call 911 or go to the nearest emergency room.

## 2012-07-16 ENCOUNTER — Other Ambulatory Visit: Payer: Self-pay | Admitting: Neurology

## 2012-07-16 ENCOUNTER — Ambulatory Visit
Admission: RE | Admit: 2012-07-16 | Discharge: 2012-07-16 | Disposition: A | Discharge status: Home / Self Care | Payer: BC Managed Care – PPO | Source: Ambulatory Visit | Attending: Neurology | Admitting: Neurology

## 2012-07-16 VITALS — BP 112/64 | HR 62

## 2012-07-16 DIAGNOSIS — R42 Dizziness and giddiness: Secondary | ICD-10-CM

## 2012-07-16 DIAGNOSIS — R1310 Dysphagia, unspecified: Secondary | ICD-10-CM

## 2012-07-16 DIAGNOSIS — R51 Headache: Secondary | ICD-10-CM

## 2012-07-16 DIAGNOSIS — R000 Tachycardia, unspecified: Secondary | ICD-10-CM

## 2012-07-16 DIAGNOSIS — R1013 Epigastric pain: Secondary | ICD-10-CM

## 2012-07-16 DIAGNOSIS — R002 Palpitations: Secondary | ICD-10-CM

## 2012-07-16 DIAGNOSIS — R072 Precordial pain: Secondary | ICD-10-CM

## 2012-07-16 DIAGNOSIS — I200 Unstable angina: Secondary | ICD-10-CM

## 2012-07-16 DIAGNOSIS — H539 Unspecified visual disturbance: Secondary | ICD-10-CM

## 2012-07-16 DIAGNOSIS — K219 Gastro-esophageal reflux disease without esophagitis: Secondary | ICD-10-CM

## 2012-07-16 DIAGNOSIS — R112 Nausea with vomiting, unspecified: Secondary | ICD-10-CM

## 2012-07-16 LAB — CSF CELL COUNT WITH DIFFERENTIAL
RBC Count, CSF: 32 cu mm — ABNORMAL HIGH
Tube #: 2

## 2012-07-16 MED ORDER — ACETAZOLAMIDE ER 500 MG PO CP12: ORAL_CAPSULE | ORAL | Status: DC

## 2012-07-16 NOTE — Progress Notes (Signed)
Quick Note:  Please call and advise the patient that her spinal tap results came back and the fluid tests were normal but her pressure was indeed elevated as I had suspected. This means that she does indeed have increased spinal fluid pressure around her brain, which can cause symptoms of blurry vision, headache, dizziness. We will need to get her started on a new medication to help keep his pressure lower. At the time of her spinal tap they did take enough fluid off to reduce the pressure but it will only be transient and build back up if we don't start medication to keep the pressure in the normal range. Therefore, I will call in a prescription for Diamox 500 mg strength: Take one pill each night at bedtime for 1 week, then 1 pill twice daily thereafter. Common side effects reported are: Dizziness, lightheadedness, increase in urine output may occur, blurry vision, dry mouth, drowsiness, loss of appetite, upset stomach, headache and tiredness may also occur. Some people have tingling in the hands and feet and change in taste, especially with carbonated sodas. Most side effects are transient especially during the first few days as the body adjusts to the medication.  Please also asked the patient if she has been seen by an eye doctor he gets and I think it is important that she see an eye doctor soon. She will also need to followup with the eye doctor to see if the pressure improves behind her eyes. She was supposed to have a sleep study in [place] and please ask her if she has been evaluated for sleep apnea yet with another sleep study.   ______

## 2012-07-20 ENCOUNTER — Telehealth: Payer: Self-pay | Admitting: Radiology

## 2012-07-20 ENCOUNTER — Telehealth: Payer: Self-pay | Admitting: Neurology

## 2012-07-20 DIAGNOSIS — R51 Headache: Secondary | ICD-10-CM

## 2012-07-20 NOTE — Telephone Encounter (Signed)
Pt has headache post LP, has been on bedrest for the last 72 hours. Has been to ER and has also called Dr. Teofilo Pod office. Office called and blood patch order requested.

## 2012-07-20 NOTE — Telephone Encounter (Signed)
I called and spoke to the pt.   Gave her the results of CSF/ LP .  She stated that she is continuing to have headache.  Order for Blood patch placed per Dr. Teofilo Pod order and pt will call [facility] to check on this tomorrow.    Pt already taking the diamox as ordered.

## 2012-07-21 ENCOUNTER — Other Ambulatory Visit: Payer: Self-pay | Admitting: Neurology

## 2012-07-21 ENCOUNTER — Ambulatory Visit
Admission: RE | Admit: 2012-07-21 | Discharge: 2012-07-21 | Disposition: A | Discharge status: Home / Self Care | Payer: BC Managed Care – PPO | Source: Ambulatory Visit | Attending: Neurology | Admitting: Neurology

## 2012-07-21 VITALS — BP 133/83 | HR 88

## 2012-07-21 DIAGNOSIS — R51 Headache: Secondary | ICD-10-CM

## 2012-07-21 LAB — CSF CULTURE W GRAM STAIN: Gram Stain: NONE SEEN

## 2012-07-21 MED ORDER — IOHEXOL 180 MG/ML  SOLN
1.0000 mL | Freq: Once | INTRAMUSCULAR | Status: AC | PRN
  Administered 2012-07-21: 1 mL via EPIDURAL

## 2012-07-21 NOTE — Progress Notes (Addendum)
20 cc of blood drawn from right [facility] for blood patch. Site is unremarkable and pt tolerated procedure well. Discharge instructions explained.

## 2012-07-21 NOTE — Progress Notes (Signed)
Quick Note:  Ms Delane Ginger has been contacted by Alverda Skeans, RN on 07-20-12 regarding these results and recommendations. ______

## 2012-08-12 LAB — FUNGUS CULTURE W SMEAR: Smear Result: NONE SEEN

## 2012-08-13 ENCOUNTER — Telehealth: Payer: Self-pay | Admitting: *Deleted

## 2012-08-13 NOTE — Telephone Encounter (Signed)
Message copied by Avie Echevaria on Fri Aug 13, 2012 11:29 AM ------      Message from: Seth Bake      Created: Thu Aug 12, 2012  9:29 AM      Contact: Patient       Wants to discuss her medication.  Want to make sure everything is ok with it.  Please call [phone] ------

## 2012-08-13 NOTE — Telephone Encounter (Signed)
Spoke with pt and relayed Dr Oliva Bustard instructions.  The pt had no further question and stated that she understood and would follow the instructions.

## 2012-08-13 NOTE — Telephone Encounter (Signed)
She can transiently reduce the dian mox for 3 days to only 505 dose. Needs to add electrolytes to her  Intake, gatorade, etc. Multivitamin and multimineral may help. Start the  Dose fully again after 3 days .

## 2012-08-13 NOTE — Telephone Encounter (Signed)
Patient described symptoms of burning/tingling on her palms, soles and lips.  Symptoms began on 07/16/2012 when pt started her Rx - Diamox 500 mg capsule.  1 pill Hs for 1 week then 1 pill BID thereafter.  Please advise.

## 2012-09-01 ENCOUNTER — Telehealth: Payer: Self-pay | Admitting: Neurology

## 2012-09-02 NOTE — Telephone Encounter (Signed)
Spoke with pt and relayed Dr Teofilo Pod instructions.

## 2012-09-02 NOTE — Telephone Encounter (Signed)
Pt reports same symptoms as described in 08/13/12 call.  She has been following the instructions given to her then and did notice a slight relief in her symptoms when she reduced the Rx.  The pt stated that she was recently advised by her PCP to drink more fluids in response to a kidney function test that had been done.  Please advise.

## 2012-09-02 NOTE — Telephone Encounter (Signed)
Please have patient continue with Diamox 500 mg each bedtime until her next appointment with me. Please remind her is a diuretic which can eventually cause too much fluid loss. It is important that she continue with good hydration.

## 2012-09-09 ENCOUNTER — Encounter: Payer: Self-pay | Admitting: Neurology

## 2012-09-09 ENCOUNTER — Ambulatory Visit (INDEPENDENT_AMBULATORY_CARE_PROVIDER_SITE_OTHER): Payer: BC Managed Care – PPO | Admitting: Neurology

## 2012-09-09 VITALS — BP 135/83 | HR 69 | Ht 65.0 in | Wt 260.0 lb

## 2012-09-09 DIAGNOSIS — G932 Benign intracranial hypertension: Secondary | ICD-10-CM

## 2012-09-09 MED ORDER — ACETAZOLAMIDE ER 500 MG PO CP12: 1000.0000 mg | ORAL_CAPSULE | Freq: Two times a day (BID) | ORAL | Status: DC

## 2012-09-09 NOTE — Patient Instructions (Signed)
  As far as your medications are concerned, I would like to suggest: Diamox 500 mg in the morning and 1000 mg at night for one week, then take 1000 mg twice daily thereafter   As far as diagnostic testing, I recommend: keep FU with eye doctor.   Engage in social activities in your community and with your family and try to keep up with current events by reading the newspaper or watching the news.  I do not think we need to make any changes in your medications at this point. I think you're stable enough that I can see you back in 6 months, sooner if we need to. Please call us if you have any interim questions, concerns, or problems or updates to need to discuss.  Brett Canales is my clinical assistant and will answer any of your questions and relay your messages to me and will give you my messages.   Our phone number is [phone]. We also have an after hours call service for urgent matters and there is a physician on-call for urgent questions. For any emergencies you know to call 911 or go to the nearest emergency room.

## 2012-09-09 NOTE — Progress Notes (Signed)
Subjective:    Patient ID: Sandra [name] is a 37 y.o. female.  HPI  Interim history:   [name] is a very pleasant 37 year old right-handed woman with an underlying medical history of reflux disease, Fibromyalgia, anxiety, depression, hypertension, duodenal ulcer, and migraine headaches who presents for followup consultation of her recent diagnosis of pseudotumor cerebri. She is unaccompanied today. I first met her on 07/06/2012 at which time she was complaining of intermittent vertigo for the past 2 months. She also has a history of obstructive sleep apnea but had not been using her CPAP machine and was scheduled for another sleep study at [facility]. She has been having a daily headache for the past month. She of brain MRI on 06/23/2012 which reported a partially empty sella. At the time of her first visit with me I felt she may have some papilledema. I suggested a lumbar puncture under fluoroscopic guidance, ophthalmology consult and she had a lumbar puncture on 07/16/12, which showed an elevated OP of 42 cm of water and a closing pressure of 20 cm. She had otherwise normal CSF findings. She was instructed to start on Diamox 500 mg once daily with build up to 500 mg twice daily. She called back in the interim in June stating that she had side effects including tingling with the Diamox and was instructed to reduce it back to 500 mg each bedtime for three days and she is now on 1000 mg qHS. She is seeing Dr. Burgess Estelle in ophthalmology tomorrow again. She was told she has elevated intraocular pressure and needed a repeat test in one month and repeat VF testing, no papilledema. She had a tough time after the LP and had a post-LP HA and needed a blood patch. She developed a UTI, which was treated with an ABx. She had a sleep study which did not show OSA. She has been losing weight. She has developed some diarrhea which could be due to the Diamox. She has been taking Imodium over-the-counter for this. She  has a lot of fatigue and feels she needs an energy pill. She is asking whether she should start taking a diet pill.   Her Past Medical History Is Significant For: Past Medical History  Diagnosis Date  . Fibromyalgia   . Depression   . Anxiety   . Hypertension   . Migraines   . Chest pain     RHC/LHC in 2007 with normal PCWP and PA pressure, normal coronaries EF 55%  . Dyspnea   . POTS (postural orthostatic tachycardia syndrome)     possible, episodes of sinus tachycardis, holter in 2007 with sinus tachycardia with rates as high as 160  . Ulcer     duodenal in 1993  . GERD (gastroesophageal reflux disease)     with esophageal stricture    Her Past Surgical History Is Significant For: Past Surgical History  Procedure Laterality Date  . Cholecystectomy    . Bladder surgery      bladder tact  . Tubal ligation      Her Family History Is Significant For: Family History  Problem Relation Age of Onset  . Kidney cancer Mother   . Diabetes Mother   . Prostate cancer Maternal Grandfather   . Ulcers Father   . Pancreatic cancer Maternal Grandmother   . Lung cancer Paternal Grandmother     metastatic  . Coronary artery disease Father     pci at age 37  . Aortic aneurysm  Her Social History Is Significant For: History   Social History  . Marital Status: Divorced    Spouse Name: N/A    Number of Children: N/A  . Years of Education: N/A   Social History Main Topics  . Smoking status: Never Smoker   . Smokeless tobacco: None  . Alcohol Use: No  . Drug Use: No  . Sexually Active: None   Other Topics Concern  . None   Social History Narrative  . None    Her Allergies Are:  Allergies  Allergen Reactions  . Hydrocodone-Acetaminophen Itching  . Zolpidem Tartrate Itching and Rash  :   Her Current Medications Are:  Outpatient Encounter Prescriptions as of 09/09/2012  Medication Sig Dispense Refill  . acetaZOLAMIDE (DIAMOX) 500 MG capsule 1 pill nightly at  bedtime for 1 week, then 1 pill twice daily thereafter.  60 capsule  5  . aspirin-acetaminophen-caffeine (EXCEDRIN MIGRAINE) 250-250-65 MG per tablet Take 1 tablet by mouth every 6 (six) hours as needed.      Marland Kitchen FLUoxetine (PROZAC) 20 MG capsule Take 20 mg by mouth daily.      Marland Kitchen ibuprofen (ADVIL,MOTRIN) 800 MG tablet Take 800 mg by mouth every 8 (eight) hours as needed.      . meclizine (ANTIVERT) 25 MG tablet       . ondansetron (ZOFRAN-ODT) 4 MG disintegrating tablet       . pantoprazole (PROTONIX) 40 MG tablet       . pravastatin (PRAVACHOL) 20 MG tablet       . promethazine (PHENERGAN) 25 MG tablet       . [DISCONTINUED] metoprolol succinate (TOPROL-XL) 50 MG 24 hr tablet Take 50 mg by mouth 2 (two) times daily. Take with or immediately following a meal.       No facility-administered encounter medications on file as of 09/09/2012.  : Review of Systems  Constitutional: Positive for fatigue.  Gastrointestinal:       Diarrhea  Neurological: Positive for numbness.    Objective:  Neurologic Exam  Physical Exam Physical Examination:   Filed Vitals:   09/09/12 1601  BP: 135/83  Pulse: 69    General Examination: The patient is a very pleasant 37 y.o. female in no acute distress. She appears well-developed and well-nourished and well groomed.   HEENT: Normocephalic, atraumatic, pupils are equal, round and reactive to light and accommodation. Funduscopic exam reveals mild bilateral papilledema. Extraocular tracking is good without limitation to gaze excursion or nystagmus noted. Normal smooth pursuit is noted. VFF by finger perimetry. Hearing is grossly intact. Tympanic membranes are clear bilaterally. Face is symmetric with normal facial animation and normal facial sensation. Speech is clear with no dysarthria noted. There is no hypophonia. There is no lip, neck/head, jaw or voice tremor. Neck is supple with full range of passive and active motion. There are no carotid bruits on  auscultation. Oropharynx exam reveals: good dental hygiene and mild airway crowding, due to narrow airway entry. Mallampati is class I. Tongue protrudes centrally and palate elevates symmetrically. Tonsils are 1+/2+ in size/absent. Neck size is 16 inches.   Chest: Clear to auscultation without wheezing, rhonchi or crackles noted.  Heart: S1+S2+0, regular and normal without murmurs, rubs or gallops noted.   Abdomen: Soft, non-tender and non-distended with normal bowel sounds appreciated on auscultation.  Extremities: There is 1+ pitting edema in the distal lower extremities bilaterally. Pedal pulses are intact.  Skin: Warm and dry without trophic changes noted. There are no varicose  veins.  Musculoskeletal: exam reveals no obvious joint deformities, tenderness or joint swelling or erythema.   Neurologically:  Mental status: The patient is awake, alert and oriented in all 4 spheres. Her memory, attention, language and knowledge are appropriate. There is no aphasia, agnosia, apraxia or anomia. Speech is clear with normal prosody and enunciation. Thought process is linear. Mood is congruent and affect is normal.  Cranial nerves are as described above under HEENT exam. In addition, shoulder shrug is normal with equal shoulder height noted. Motor exam: Normal bulk, strength and tone is noted. There is no drift, tremor or rebound. Romberg is negative. Reflexes are 2+ throughout. Toes are downgoing bilaterally. Fine motor skills are intact with normal finger taps, normal hand movements, normal rapid alternating patting, normal foot taps and normal foot agility.  Cerebellar testing shows no dysmetria or intention tremor on finger to nose testing. There is no truncal or gait ataxia.  Sensory exam is intact to light touch, pinprick, vibration, temperature sense and proprioception in the upper and lower extremities.  Gait, station and balance are unremarkable. No veering to one side is noted. No leaning to  one side is noted. Posture is age-appropriate and stance is narrow based. No problems turning are noted. She turns en bloc. Tandem walk is unremarkable. Intact toe and heel stance is noted.               Assessment and Plan:   Assessment and Plan:  In summary, Sandra [name] is a very pleasant 37 y.o.-year old female with a recent Dx of pseudotumor cerebri, with a LP showing an OP elevated at 42 cm. She has mild papilledema on my exam and has another check up with Dr. Burgess Estelle. Her physical exam is non-focal otherwise. She had tingling with diamox and is on 1 g qHS. She is advised to increase it to 500 mg in AM and 1000 mg in PM for one week then 1 g bid thereafter. Given her Hx of elevated intraocular pressure, she may not be a candidate for Topamax down the road. We talked about weight loss again today. She can continue with Imodium as needed. Her tingling and diarrhea may get worse we have to watch it. I have asked her to drink plenty of fluid. I have advised her against a diet pill for now. I answered all her questions today and the patient was in agreement with the above outlined plan. I would like to see the patient back in 2 month, sooner if the need arises and encouraged her to call with any interim questions, concerns, problems or updates and test results.

## 2012-09-10 ENCOUNTER — Telehealth: Payer: Self-pay | Admitting: Neurology

## 2012-09-14 ENCOUNTER — Ambulatory Visit: Payer: BC Managed Care – PPO | Admitting: Neurology

## 2012-09-15 ENCOUNTER — Telehealth: Payer: Self-pay | Admitting: Neurology

## 2012-09-15 DIAGNOSIS — G932 Benign intracranial hypertension: Secondary | ICD-10-CM

## 2012-09-15 MED ORDER — ACETAZOLAMIDE ER 500 MG PO CP12: ORAL_CAPSULE | ORAL | Status: DC

## 2012-09-15 NOTE — Telephone Encounter (Signed)
OV note says: She had tingling with diamox and is on 1 g qHS. She is advised to increase it to 500 mg in AM and 1000 mg in PM for one week then 1 g bid thereafter. I have updated the instructions and resent the Rx.  I called the patient back.  She is aware Rx was sent.  She verbalized understanding on new med instructions.

## 2012-09-21 ENCOUNTER — Telehealth: Payer: Self-pay | Admitting: Neurology

## 2012-11-10 ENCOUNTER — Ambulatory Visit: Payer: BC Managed Care – PPO | Admitting: Neurology

## 2012-11-11 ENCOUNTER — Encounter: Payer: Self-pay | Admitting: Nurse Practitioner

## 2012-11-11 ENCOUNTER — Ambulatory Visit: Payer: BC Managed Care – PPO | Admitting: Neurology

## 2012-11-11 ENCOUNTER — Ambulatory Visit (INDEPENDENT_AMBULATORY_CARE_PROVIDER_SITE_OTHER): Payer: BC Managed Care – PPO | Admitting: Nurse Practitioner

## 2012-11-11 VITALS — BP 110/71 | HR 74 | Temp 98.6°F | Ht 66.0 in | Wt 253.0 lb

## 2012-11-11 DIAGNOSIS — H539 Unspecified visual disturbance: Secondary | ICD-10-CM

## 2012-11-11 DIAGNOSIS — R51 Headache: Secondary | ICD-10-CM

## 2012-11-11 DIAGNOSIS — G932 Benign intracranial hypertension: Secondary | ICD-10-CM

## 2012-11-11 DIAGNOSIS — R42 Dizziness and giddiness: Secondary | ICD-10-CM

## 2012-11-11 MED ORDER — IBUPROFEN 800 MG PO TABS: 800.0000 mg | ORAL_TABLET | Freq: Three times a day (TID) | ORAL | Status: DC | PRN

## 2012-11-11 MED ORDER — RIZATRIPTAN BENZOATE 10 MG PO TBDP: 10.0000 mg | ORAL_TABLET | ORAL | Status: DC | PRN

## 2012-11-11 NOTE — Patient Instructions (Addendum)
I have sent in refills to the [facility] for your Ibuprofen and a new Prescription for Maxalt.  Take Ibuprofen for tension or pressure related headaches and Maxalt when you feel a severe Migraine type headache coming on. Continue Diamox at your current dose. Follow up with an office visit with Dr. Frances Furbish in 3 months.  As far as diagnostic testing, I recommend: keep FU with eye doctor.  Please ask Dr. Burgess Estelle about your eye pressures and ask if you are at risk for glaucoma.  Ask if he sees a contraindication to taking Topamax for treatment of the headaches.  Please call the office and let us know what Dr. Burgess Estelle recommends.  I do not think we need to make any changes in your medications at this point. I think you're stable enough that I can see you back in 3 months, sooner if we need to. Please call us if you have any interim questions, concerns, or problems or updates to need to discuss.  Brett Canales is my clinical assistant and will answer any of your questions and relay your messages to me and will give you my messages.  Our phone number is [phone]. We also have an after hours call service for urgent matters and there is a physician on-call for urgent questions. For any emergencies you know to call 911 or go to the nearest emergency room.

## 2012-11-11 NOTE — Progress Notes (Addendum)
[facility]  PATIENT: Sandra [name] DOB: 1976-02-15   REASON FOR VISIT: follow up for pseudotumor cerebri HISTORY FROM: patient  HISTORY OF PRESENT ILLNESS: [name] is a very pleasant 37 year old right-handed woman with an underlying medical history of reflux disease, Fibromyalgia, anxiety, depression, hypertension, duodenal ulcer, and migraine headaches who presents for followup consultation of her recent diagnosis of pseudotumor cerebri. She is unaccompanied today. I first met her on 07/06/2012 at which time she was complaining of intermittent vertigo for the past 2 months. She also has a history of obstructive sleep apnea but had not been using her CPAP machine and was scheduled for another sleep study at [facility]. She has been having a daily headache for the past month. She of brain MRI on 06/23/2012 which reported a partially empty sella. At the time of her first visit with me I felt she may have some papilledema. I suggested a lumbar puncture under fluoroscopic guidance, ophthalmology consult and she had a lumbar puncture on 07/16/12, which showed an elevated OP of 42 cm of water and a closing pressure of 20 cm. She had otherwise normal CSF findings. She was instructed to start on Diamox 500 mg once daily with build up to 500 mg twice daily. She called back in the interim in June stating that she had side effects including tingling with the Diamox and was instructed to reduce it back to 500 mg each bedtime for three days and she is now on 1000 mg qHS. She is seeing Dr. Burgess Estelle in ophthalmology tomorrow again. She was told she has elevated intraocular pressure and needed a repeat test in one month and repeat VF testing, no papilledema. She had a tough time after the LP and had a post-LP HA and needed a blood patch. She developed a UTI, which was treated with an ABx. She had a sleep study which did not show OSA. She has been losing weight. She has developed some diarrhea  which could be due to the Diamox. She has been taking Imodium over-the-counter for this. She has a lot of fatigue and feels she needs an energy pill. She is asking whether she should start taking a diet pill.  UPDATE 11/11/12 (LL):  Mrs. Sandra [name] returns for earlier follow up than scheduled due to increased headaches.  She had LP on 07/16/12 with relief in headaches for a few weeks but they have increased in intensity since then.  She states headache is in the occipital region, constant pain, average 7/10 pain, worse as the day goes on.  She has been trying hard to lose weight, with diet modification, and increasing water intake. She has lost 7 lbs since July 17 office visit.  She is interested in using a diet pill, states that she had used Adipex after the birth of her second child with good results.  She is following up with Dr. Burgess Estelle with [facility] regularly, next appointment next week.  She is tolerating the increase in Diamox, but has side effects of fatigue and diarrhea.    REVIEW OF SYSTEMS: Full 14 system review of systems performed and notable only for:  Constitutional: N/A  Cardiovascular: N/A  Ear/Nose/Throat: N/A  Skin: N/A  Eyes: N/A  Respiratory: N/A  Gastroitestinal: N/A  Genitourinary: N/A Hematology/Lymphatic: easy bruising  Endocrine: N/A Musculoskeletal: joint pain, aching muscles Allergy/Immunology: allergies  Neurological: headache, numbness, dizziness Psychiatric: N/A Sleep: N/A   ALLERGIES: Allergies  Allergen Reactions  . Hydrocodone-Acetaminophen Itching  . Zolpidem Tartrate Itching  and Rash    HOME MEDICATIONS: Outpatient Prescriptions Prior to Visit  Medication Sig Dispense Refill  . acetaZOLAMIDE (DIAMOX) 500 MG capsule 1 tab (500mg ) in the morning and 2 tabs (1000mg ) nightly at bedtime for 1 week, then 2 tabs (1000mg ) twice daily thereafter.  120 capsule  5  . FLUoxetine (PROZAC) 20 MG capsule Take 20 mg by mouth daily.      . meclizine  (ANTIVERT) 25 MG tablet       . ondansetron (ZOFRAN-ODT) 4 MG disintegrating tablet       . pantoprazole (PROTONIX) 40 MG tablet       . pravastatin (PRAVACHOL) 20 MG tablet       . promethazine (PHENERGAN) 25 MG tablet       . aspirin-acetaminophen-caffeine (EXCEDRIN MIGRAINE) 250-250-65 MG per tablet Take 1 tablet by mouth every 6 (six) hours as needed.      Marland Kitchen ibuprofen (ADVIL,MOTRIN) 800 MG tablet Take 800 mg by mouth every 8 (eight) hours as needed.       No facility-administered medications prior to visit.    PAST MEDICAL HISTORY: Past Medical History  Diagnosis Date  . Fibromyalgia   . Depression   . Anxiety   . Hypertension   . Migraines   . Chest pain     RHC/LHC in 2007 with normal PCWP and PA pressure, normal coronaries EF 55%  . Dyspnea   . POTS (postural orthostatic tachycardia syndrome)     possible, episodes of sinus tachycardis, holter in 2007 with sinus tachycardia with rates as high as 160  . Ulcer     duodenal in 1993  . GERD (gastroesophageal reflux disease)     with esophageal stricture    PAST SURGICAL HISTORY: Past Surgical History  Procedure Laterality Date  . Cholecystectomy    . Bladder surgery      bladder tact  . Tubal ligation      FAMILY HISTORY: Family History  Problem Relation Age of Onset  . Kidney cancer Mother   . Diabetes Mother   . Prostate cancer Maternal Grandfather   . Ulcers Father   . Pancreatic cancer Maternal Grandmother   . Lung cancer Paternal Grandmother     metastatic  . Coronary artery disease Father     pci at age 86  . Aortic aneurysm      SOCIAL HISTORY: History   Social History  . Marital Status: Divorced    Spouse Name: N/A    Number of Children: N/A  . Years of Education: N/A   Occupational History  . Not on file.   Social History Main Topics  . Smoking status: Never Smoker   . Smokeless tobacco: Not on file  . Alcohol Use: No  . Drug Use: No  . Sexual Activity: Not on file   Other Topics  Concern  . Not on file   Social History Narrative  . No narrative on file     PHYSICAL EXAM  Filed Vitals:   11/11/12 0851  BP: 110/71  Pulse: 74  Temp: 98.6 F (37 C)  TempSrc: Oral  Height: 5\' 6"  (1.676 m)  Weight: 253 lb (114.76 kg)   Body mass index is 40.85 kg/(m^2).  Generalized: Well developed, in no acute distress, pleasant Caucasian female, obese Head: normocephalic and atraumatic. Oropharynx benign  Neck: Supple, no carotid bruits  Cardiac: Regular rate rhythm, no murmur  Musculoskeletal: No deformity   Neurological examination   Mentation: Alert oriented to time, place, history  taking. Follows all commands speech and language fluent Cranial nerve II-XII: Fundoscopic exam reveals sharp disc margins.Pupils were equal round reactive to light extraocular movements were full, visual field were full on confrontational test. Facial sensation and strength were normal. hearing was intact to finger rubbing bilaterally. Uvula tongue midline. head turning and shoulder shrug and were normal and symmetric.Tongue protrusion into cheek strength was normal. Motor: normal bulk and tone, full strength in the BUE, BLE, fine finger movements normal, no pronator drift. No focal weakness Sensory: normal and symmetric to light touch, pinprick, and  vibration  Coordination: finger-nose-finger, heel-to-shin bilaterally, no dysmetria Reflexes: Brachioradialis 2/2, biceps 2/2, triceps 2/2, patellar 2/2, Achilles 2/2, plantar responses were flexor bilaterally. Gait and Station: Rising up from seated position without assistance, normal stance, without trunk ataxia, moderate stride, good arm swing, smooth turning, able to perform tiptoe, and heel walking without difficulty.   DIAGNOSTIC DATA (LABS, IMAGING, TESTING) - I reviewed patient records, labs, notes, testing and imaging myself where available.  07/21/12 LUMBAR PUNCTURE UNDER FLUOROSCOPY  Technically successful lumbar puncture under  fluoroscopy.   Opening pressure value of 42 cm water confirms the clinical findings of pseudotumor cerebri. The pressure was reduced to less than half of the opening pressure by the end of the procedure.  ASSESSMENT AND PLAN In summary, Sandra [name] is a very pleasant 37 y.o.-year old female with a recent Dx of pseudotumor cerebri, with a LP showing an OP elevated at 42 cm. She gets regular eye checks with Dr. Burgess Estelle. Her physical exam is non-focal otherwise.  She has an increase in headaches, may be of mixed type, given history of Migraines as well.  I advise her to use Maxalt for severe headaches and Ibuprofen for moderate tension-type headaches.  Given her Hx of elevated intraocular pressure, she may not be a candidate for Topamax.  I have asked her to discuss this with Dr. Burgess Estelle next week.   We talked about weight loss again today.  I have sent a letter to Dr. Sherril Croon, Mrs. Shiela Mayer PCP,  and expressed Mrs Shiela Mayer interest in taking a diet pill and asked if he would be able to manage her in that regard.  I answered all her questions today and the patient was in agreement with the above outlined plan. I would like to see the patient back in 3 months, sooner if the need arises and encouraged her to call with any interim questions, concerns, problems or updates and test results.   Meds ordered this encounter  Medications  . rizatriptan (MAXALT-MLT) 10 MG disintegrating tablet    Sig: Take 1 tablet (10 mg total) by mouth as needed for migraine. May repeat in 2 hours if needed    Dispense:  9 tablet    Refill:  11    Order Specific Question:  Supervising Provider    Answer:  Joycelyn Schmid R [[id number]]  . ibuprofen (ADVIL,MOTRIN) 800 MG tablet    Sig: Take 1 tablet (800 mg total) by mouth every 8 (eight) hours as needed.    Dispense:  90 tablet    Refill:  3    Order Specific Question:  Supervising Provider    Answer:  Joycelyn Schmid R [[id number]]   Tawny Asal Antione Obar, MSN, NP-C 11/11/2012, 9:46 AM [facility] [address] [place], [place] [address] [phone]  I reviewed the above note and documentation by the Nurse Practitioner and agree with the history, physical exam, assessment and plan as outlined  above. In addition, I also talked with the patient and started her on Topamax after she called back reporting that her eye doctor was in agreement with trying Topamax. Huston Foley, MD, PhD [facility])

## 2012-11-15 ENCOUNTER — Other Ambulatory Visit: Payer: Self-pay | Admitting: Neurology

## 2012-11-15 DIAGNOSIS — G932 Benign intracranial hypertension: Secondary | ICD-10-CM

## 2012-11-15 DIAGNOSIS — G43909 Migraine, unspecified, not intractable, without status migrainosus: Secondary | ICD-10-CM

## 2012-11-15 MED ORDER — TOPIRAMATE 50 MG PO TABS: ORAL_TABLET | ORAL | Status: DC

## 2012-11-15 NOTE — Progress Notes (Signed)
Please advise pt to start Topamax 50 mg: Take half a pill each bedtime for one week, then one pill at bedtime daily for one week, then 1-1/2 pills at bedtime daily for one week, then 2 pills at bedtime daily thereafter. Common side effects reported are: Sedation, sleepiness, tingling, change in taste especially with carbonated drinks, and rare side effects are glaucoma, kidney stones, and problems with thinking including word finding difficulties. Please advise patient that topamax can reduce the effectiveness of oral contraceptive pills and that she may need another means for birth control in addition to her current method of preventing a pregnancy.

## 2012-12-30 ENCOUNTER — Other Ambulatory Visit: Payer: Self-pay

## 2013-01-27 ENCOUNTER — Ambulatory Visit: Payer: BC Managed Care – PPO | Admitting: Neurology

## 2013-01-27 ENCOUNTER — Ambulatory Visit (INDEPENDENT_AMBULATORY_CARE_PROVIDER_SITE_OTHER): Payer: BC Managed Care – PPO | Admitting: Neurology

## 2013-01-27 ENCOUNTER — Encounter: Payer: Self-pay | Admitting: Neurology

## 2013-01-27 VITALS — BP 132/83 | HR 91 | Temp 98.8°F | Ht 65.0 in | Wt 232.0 lb

## 2013-01-27 DIAGNOSIS — R42 Dizziness and giddiness: Secondary | ICD-10-CM

## 2013-01-27 DIAGNOSIS — G43909 Migraine, unspecified, not intractable, without status migrainosus: Secondary | ICD-10-CM

## 2013-01-27 DIAGNOSIS — G932 Benign intracranial hypertension: Secondary | ICD-10-CM

## 2013-01-27 DIAGNOSIS — R51 Headache: Secondary | ICD-10-CM

## 2013-01-27 DIAGNOSIS — N200 Calculus of kidney: Secondary | ICD-10-CM

## 2013-01-27 MED ORDER — ACETAZOLAMIDE ER 500 MG PO CP12: 1000.0000 mg | ORAL_CAPSULE | Freq: Two times a day (BID) | ORAL | Status: DC

## 2013-01-27 NOTE — Progress Notes (Signed)
Subjective:    Patient ID: Sandra [name] is a 37 y.o. female.  HPI   Interim history:   Sandra [name] is a very pleasant 37 year old right-handed woman with an underlying medical history of reflux disease, Fibromyalgia, anxiety, depression, hypertension, duodenal ulcer, and migraine headaches who presents for followup consultation of her recent diagnosis of pseudotumor cerebri. She is unaccompanied today. I last saw her on 09/09/2012 at which time I asked her to increase her Diamox. In the interim she was seen by our nurse practitioner, Ms. Lam on 11/11/2012 d/t increase in her headache frequency, at which time I also spoke to the patient. She was supposed to see Dr. Burgess Estelle in ophthalmology back soon and we decided that we would possibly start her on Topamax after that. She does have a history of increased intraocular pressure. I started her on low-dose Topamax with titration. She has been tolerating Diamox but does report some fatigue and had some diarrhea with it, which actually resolved.  Unfortunately, she has developed a kidney stone, needed a stent and needed surgery in November. This was done at [facility] and cut back from 100 mg to 50 mg daily. She had to be treated with 2 rounds of ABx, but developed oral candidiasis and was treated for that with nystatin. She is getting over a sinus infection currently. She is on amoxicillin, prior to that she had a URI. Her headaches are worse again. On the positive side, she had improvement after lumbar puncture of her headaches, she is now able to tolerate the Diamox, and she has been able to lose some weight.  I first met her on 07/06/2012 at which time she was complaining of intermittent vertigo for 2 months. She also has a history of OSA but had not been using her CPAP machine and was scheduled for another sleep study at [facility]. Brain MRI from 06/23/2012 reported a partially empty sella. At the time of her first visit with me I felt she may  have some papilledema. I suggested a lumbar puncture under fluoroscopic guidance, and ophthalmology consult. She had a lumbar puncture on 07/16/12, which showed an elevated OP of 42 cm of water and a closing pressure of 20 cm. She had otherwise normal CSF findings. She was instructed to start on Diamox 500 mg once daily with build up to 500 mg twice daily. She called back in the interim in June stating that she had side effects including tingling with the Diamox and was instructed to reduce it back to 500 mg each bedtime for three days and she was back on 1000 mg qHS. She was told she has elevated intraocular pressure and needed a repeat test in one month and repeat VF testing, no papilledema. She had a tough time after the LP and had a post-LP HA and needed a blood patch. She developed a UTI, which was treated with an ABx. She had a sleep study which did not show OSA. She has been losing weight. She has developed some diarrhea which could be due to the Diamox. She has been taking Imodium over-the-counter for this. She has a lot of fatigue and feels she needs an energy pill. She was asking whether she should start taking a diet pill.   Her Past Medical History Is Significant For: Past Medical History  Diagnosis Date  . Fibromyalgia   . Depression   . Anxiety   . Hypertension   . Migraines   . Chest pain  RHC/LHC in 2007 with normal PCWP and PA pressure, normal coronaries EF 55%  . Dyspnea   . POTS (postural orthostatic tachycardia syndrome)     possible, episodes of sinus tachycardis, holter in 2007 with sinus tachycardia with rates as high as 160  . Ulcer     duodenal in 1993  . GERD (gastroesophageal reflux disease)     with esophageal stricture    Her Past Surgical History Is Significant For: Past Surgical History  Procedure Laterality Date  . Cholecystectomy    . Bladder surgery      bladder tact  . Tubal ligation      Her Family History Is Significant For: Family History   Problem Relation Age of Onset  . Kidney cancer Mother   . Diabetes Mother   . Prostate cancer Maternal Grandfather   . Ulcers Father   . Pancreatic cancer Maternal Grandmother   . Lung cancer Paternal Grandmother     metastatic  . Coronary artery disease Father     pci at age 36  . Aortic aneurysm      Her Social History Is Significant For: History   Social History  . Marital Status: Divorced    Spouse Name: N/A    Number of Children: N/A  . Years of Education: N/A   Social History Main Topics  . Smoking status: Never Smoker   . Smokeless tobacco: None  . Alcohol Use: No  . Drug Use: No  . Sexual Activity: None   Other Topics Concern  . None   Social History Narrative  . None    Her Allergies Are:  Allergies  Allergen Reactions  . Ambien [Zolpidem Tartrate] Rash and Other (See Comments)    Pt states she cannot walk after taking this medication  . Hydrocodone-Acetaminophen Itching    States only Vicodin makes her itch  . Zolpidem Tartrate Itching and Rash  :   Her Current Medications Are:  Outpatient Encounter Prescriptions as of 01/27/2013  Medication Sig  . acetaZOLAMIDE (DIAMOX) 500 MG capsule 1 tab (500mg ) in the morning and 2 tabs (1000mg ) nightly at bedtime for 1 week, then 2 tabs (1000mg ) twice daily thereafter.  Marland Kitchen FLUoxetine (PROZAC) 20 MG capsule Take 20 mg by mouth daily.  Marland Kitchen ibuprofen (ADVIL,MOTRIN) 800 MG tablet Take 1 tablet (800 mg total) by mouth every 8 (eight) hours as needed.  . meclizine (ANTIVERT) 25 MG tablet   . ondansetron (ZOFRAN-ODT) 4 MG disintegrating tablet   . pantoprazole (PROTONIX) 40 MG tablet   . pravastatin (PRAVACHOL) 20 MG tablet   . promethazine (PHENERGAN) 25 MG tablet   . rizatriptan (MAXALT-MLT) 10 MG disintegrating tablet Take 1 tablet (10 mg total) by mouth as needed for migraine. May repeat in 2 hours if needed  . topiramate (TOPAMAX) 50 MG tablet 1/2 pill each bedtime x 1 week, then 1 pill nightly x 1 week, then  1-1/2 pills nightly x 1 week, then 2 pills nightly thereafter.  :  Review of Systems:  Out of a complete 14 point review of systems, all are reviewed and negative with the exception of these symptoms as listed below:    Review of Systems  Constitutional: Negative.   Eyes: Negative.   Respiratory: Negative.   Cardiovascular: Negative.   Gastrointestinal: Negative.   Endocrine: Negative.   Musculoskeletal: Positive for arthralgias and myalgias.  Skin: Negative.   Allergic/Immunologic: Negative.   Neurological: Positive for dizziness and headaches.  Hematological: Bruises/bleeds easily.  Psychiatric/Behavioral: Positive for  sleep disturbance.    Objective:  Neurologic Exam  Physical Exam Physical Examination:   Filed Vitals:   01/27/13 1519  BP: 132/83  Pulse: 91  Temp: 98.8 F (37.1 C)     General Examination: The patient is a very pleasant 37 y.o. female in no acute distress. She appears well-developed and well-nourished and well groomed.   HEENT: Normocephalic, atraumatic, pupils are equal, round and reactive to light and accommodation. Funduscopic exam reveals no papilledema. Extraocular tracking is good without limitation to gaze excursion or nystagmus noted. Normal smooth pursuit is noted. VFF by finger perimetry. Hearing is grossly intact. Face is symmetric with normal facial animation and normal facial sensation. Speech is clear with no dysarthria noted. There is no hypophonia. There is no lip, neck/head, jaw or voice tremor. Neck is supple with full range of passive and active motion. There are no carotid bruits on auscultation. Oropharynx exam reveals: good dental hygiene and mild airway crowding, due to narrow airway entry. Mallampati is class I. Tongue protrudes centrally and palate elevates symmetrically. Tonsils are 1+/2+ in size/absent. Neck size is 16 inches.   Chest: Coarse breath sounds. No wheezing, rhonchi or crackles noted.  Heart: S1+S2+0, regular and  normal without murmurs, rubs or gallops noted.   Abdomen: Soft, non-tender and non-distended with normal bowel sounds appreciated on auscultation.  Extremities: There is 1+ pitting edema in the distal lower extremities bilaterally. Pedal pulses are intact.  Skin: Warm and dry without trophic changes noted. There are no varicose veins.  Musculoskeletal: exam reveals no obvious joint deformities, tenderness or joint swelling or erythema.   Neurologically:  Mental status: The patient is awake, alert and oriented in all 4 spheres. Her memory, attention, language and knowledge are appropriate. There is no aphasia, agnosia, apraxia or anomia. Speech is clear with normal prosody and enunciation. Thought process is linear. Mood is congruent and affect is normal.  Cranial nerves are as described above under HEENT exam. In addition, shoulder shrug is normal with equal shoulder height noted. Motor exam: Normal bulk, strength and tone is noted. There is no drift, tremor or rebound. Romberg is negative. Reflexes are 2+ throughout. Toes are downgoing bilaterally. Fine motor skills are intact with normal finger taps, normal hand movements, normal rapid alternating patting, normal foot taps and normal foot agility.  Cerebellar testing shows no dysmetria or intention tremor on finger to nose testing. There is no truncal or gait ataxia.  Sensory exam is intact to light touch, pinprick, vibration, temperature sense in the upper and lower extremities.  Gait, station and balance are unremarkable. No veering to one side is noted. No leaning to one side is noted. Posture is age-appropriate and stance is narrow based. No problems turning are noted. She turns en bloc. Tandem walk is unremarkable. Intact toe and heel stance is noted.               Assessment and Plan:   In summary, Sandra [name] is a very pleasant 37 year old female with a recent Dx of pseudotumor cerebri, with a LP showing an OP elevated at 42 cm. She  has no papilledema on my exam today, and had another check up with Dr. Burgess Estelle in the recent past. Her physical exam is focal otherwise, but she has had recurrence of her HA. Unfortunately she had a few setbacks recently. She developed a kidney stone after she started Topamax and I think it is not worth our while to try continue with the medication.  Therefore I have asked her to taper off of it. She's currently taking 50 mg at night and she is asked to reduce it to half a pill each night for a week and then stop altogether. While she did have tingling and diarrhea with Diamox she is now able to tolerate 1000 mg twice daily but because of her recurrence of her headache I would like for her to have another lumbar puncture. Unfortunately, in the past he needed a blood patch after that. She is encouraged to continue to strive for weight loss. We talked about other headache triggers including stress. She is advised to schedule her lumbar puncture at her next convenience and they should call her directly for that. If the pressure is again significantly elevated she may need to have a shunt placed in may need a referral in that regard and I will call her to discuss that after the lumbar puncture is done. I worry, that she has further increase in her intracranial pressure, even though she does not currently have papilledema.  I answered all her questions today and the patient was in agreement with the above outlined plan. I would like to see the patient back in 2 to 3 months, sooner if the need arises and encouraged her to call with any interim questions, concerns, problems or updates and test results. I renewed her prescription for Diamox. We will do some blood work today including CMP and CBC with differential.

## 2013-01-27 NOTE — Patient Instructions (Addendum)
I think overall you are doing fairly well and are stable at this point.   I do have some generic suggestions for you today:  Please make sure that you drink plenty of fluids. I would like for you to exercise daily for example in the form of walking 20-30 minutes every day, if you can. Please keep a regular sleep-wake schedule, keep regular meal times, do not skip any meals, eat  healthy snacks in between meals, such as fruit or nuts. Try to eat protein with every meal.   As far as your medications are concerned, I would like to suggest: Reduce Topamax to half a pill each night for one week then stop. I do not think you should take this because of your kidney stone history. Continue Diamox at the same dose.   As far as diagnostic testing, I recommend: let's set you up with another LP, since your headache is worse again.   I do not think we need to make any changes in your medications at this point.   Please also call us for any test results so we can go over those with you on the phone. Brett Canales is my clinical assistant and will answer any of your questions and relay your messages to me and will give you my messages.   Our phone number is [phone]. We also have an after hours call service for urgent matters and there is a physician on-call for urgent questions. For any emergencies you know to call 911 or go to the nearest emergency room.

## 2013-01-28 LAB — COMPREHENSIVE METABOLIC PANEL
Albumin: 4.3 g/dL (ref 3.5–5.5)
Alkaline Phosphatase: 93 IU/L (ref 39–117)
BUN/Creatinine Ratio: 9 (ref 8–20)
BUN: 10 mg/dL (ref 6–20)
CO2: 18 mmol/L (ref 18–29)
Calcium: 9.7 mg/dL (ref 8.7–10.2)
Creatinine, Ser: 1.1 mg/dL — ABNORMAL HIGH (ref 0.57–1.00)
Globulin, Total: 2.6 g/dL (ref 1.5–4.5)
Total Protein: 6.9 g/dL (ref 6.0–8.5)

## 2013-01-28 LAB — CBC WITH DIFFERENTIAL
Basos: 0 %
Eos: 2 %
HCT: 38.5 % (ref 34.0–46.6)
Hemoglobin: 12.9 g/dL (ref 11.1–15.9)
Lymphocytes Absolute: 1.4 10*3/uL (ref 0.7–3.1)
MCH: 29.5 pg (ref 26.6–33.0)
MCV: 88 fL (ref 79–97)
Monocytes Absolute: 0.2 10*3/uL (ref 0.1–0.9)
Neutrophils Absolute: 2.5 10*3/uL (ref 1.4–7.0)
RBC: 4.38 x10E6/uL (ref 3.77–5.28)

## 2013-01-28 NOTE — Progress Notes (Signed)
Quick Note:  Please advise patient that her labs looked fine. Her creatinine which is a marker for kidney function is just borderline elevated at 1.1 with the normal cut off at 1.  Huston Foley, MD, PhD [facility] ([facility])  ______

## 2013-02-04 ENCOUNTER — Ambulatory Visit
Admission: RE | Admit: 2013-02-04 | Discharge: 2013-02-04 | Disposition: A | Discharge status: Home / Self Care | Payer: BC Managed Care – PPO | Source: Ambulatory Visit | Attending: Neurology | Admitting: Neurology

## 2013-02-04 VITALS — BP 103/52 | HR 66

## 2013-02-04 DIAGNOSIS — K219 Gastro-esophageal reflux disease without esophagitis: Secondary | ICD-10-CM

## 2013-02-04 DIAGNOSIS — R002 Palpitations: Secondary | ICD-10-CM

## 2013-02-04 DIAGNOSIS — R112 Nausea with vomiting, unspecified: Secondary | ICD-10-CM

## 2013-02-04 DIAGNOSIS — G932 Benign intracranial hypertension: Secondary | ICD-10-CM

## 2013-02-04 DIAGNOSIS — R000 Tachycardia, unspecified: Secondary | ICD-10-CM

## 2013-02-04 DIAGNOSIS — I200 Unstable angina: Secondary | ICD-10-CM

## 2013-02-04 DIAGNOSIS — R51 Headache: Secondary | ICD-10-CM

## 2013-02-04 DIAGNOSIS — R1013 Epigastric pain: Secondary | ICD-10-CM

## 2013-02-04 DIAGNOSIS — R1310 Dysphagia, unspecified: Secondary | ICD-10-CM

## 2013-02-04 DIAGNOSIS — R072 Precordial pain: Secondary | ICD-10-CM

## 2013-02-04 LAB — CSF CELL COUNT WITH DIFFERENTIAL
RBC Count, CSF: 0 cu mm
Tube #: 4
WBC, CSF: 0 cu mm (ref 0–5)

## 2013-02-04 NOTE — Progress Notes (Signed)
Quick Note:  Please advise patient that her recent lumbar puncture did indeed confirm an elevated opening pressure at 29 cm, but thankfully, this was much better than when she had her last lumbar puncture at which time her pressure was elevated at 42 cm. She should have experienced an improvement in her headaches hopefully with this tap. I would recommend that she continue with the current medication regimen. Please be mindful of the possibility of post LP headache and advise her to follow the advice from the radiologist which usually includes to stay well-hydrated, and to try to lay completely flat for 24 hours post LP. Huston Foley, MD, PhD [facility] ([facility]  ______

## 2013-02-04 NOTE — Progress Notes (Signed)
Quick Note:  In addition to my previous note, please also reassure patient that her lab test results so far on the spinal fluid were unremarkable as expected. Huston Foley, MD, PhD [facility] ([facility]  ______

## 2013-02-04 NOTE — Progress Notes (Signed)
Quick Note:  Shared LP results with patient per Dr Teofilo Pod findings,patient verbalized understanding. ______

## 2013-02-08 LAB — GRAM STAIN
Gram Stain: NONE SEEN
Gram Stain: NONE SEEN

## 2013-02-14 LAB — VIRAL CULTURE VIRC: Organism ID, Bacteria: NEGATIVE

## 2013-02-28 ENCOUNTER — Telehealth: Payer: Self-pay | Admitting: Neurology

## 2013-02-28 NOTE — Telephone Encounter (Signed)
Having bad headaches for two weeks asked for Brett CanalesSteve to call her

## 2013-03-01 NOTE — Telephone Encounter (Signed)
Patient calling back again to follow up, says she hasn't heard anything yet. Please call patient.

## 2013-03-07 LAB — FUNGUS CULTURE W SMEAR: Smear Result: NONE SEEN

## 2013-03-08 ENCOUNTER — Telehealth: Payer: Self-pay | Admitting: Neurology

## 2013-03-08 NOTE — Telephone Encounter (Signed)
Patient calling again this week wanting a call back from Dr. Teofilo PodAthar's nurse regarding bad headaches occuring in the back of her head. Best to reach her on her work phone.  Please call.

## 2013-03-16 NOTE — Telephone Encounter (Signed)
Pt returned Steve's call.  She asked to be called back at her work#  [phone].  She can't answer her cell phone at work.  Thanks!

## 2013-03-18 NOTE — Telephone Encounter (Signed)
Attempted to contact Ms Sandra [name] at her work as requested.  Unable to leave a message as her name was not listed on the directory; I also pressed "O" for the operator as requested: I was then requested to leave a message for the operator, which I didn't do.

## 2013-03-24 NOTE — Telephone Encounter (Signed)
Pt called again stating that since she has the fluid drawn her head has been hurting.  She is having headaches on the back of her head everyday. They are making her dizzy and feel nauseous.  Please call her cell 1st [phone] and if you don't get her try her work # [phone].  Thank you

## 2013-03-25 ENCOUNTER — Telehealth: Payer: Self-pay | Admitting: *Deleted

## 2013-03-25 NOTE — Telephone Encounter (Signed)
Spoke with patient and she started having headaches to back of head after fluid had been drawn for LP, nauseated, head has been pounding by the end of the day at work,  has to be in bed all weekends. She wants to know is this normal? Please call her on work phone [phone], goes to lunch at 12:00.

## 2013-03-25 NOTE — Telephone Encounter (Signed)
Shared message with patient per Dr Frances FurbishAthar. Patient said that she will contact the radiology department, and will call back if any more questions or concerns

## 2013-03-25 NOTE — Telephone Encounter (Signed)
Having severe pounding headache is not normal. However, it has been over a month since her LP. She may still benefit from a lumbar puncture with a blood patch. She may be able to schedule this soon. Please advise her that she has to get in touch with the radiology department where she had the lumbar puncture and come in for a blood patch. Pls advise pt.  Huston FoleySaima Sidrah Harden, MD, PhD [facility]

## 2013-03-25 NOTE — Telephone Encounter (Signed)
I called pt and she has c/o headache since 1 wk post LP (02-04-14).  She is taking 2000mg  diamox daily. No new medications.  Hard to ascertain if headache like post LP from first LP.  Headache back of head, neck. Sharp shooting pains, dizziness, nausea. Level 8-10.  Has been to pcp and received toradol inj which helped some, has taken percocet/w/ phenergan, maxalt , with really no help.  Made appt for 03-28-13 at 1100 for evaluation with Dr. Frances FurbishAthar.   ? Botox, VPS?  ? Blood patch.

## 2013-03-25 NOTE — Telephone Encounter (Signed)
Spoke with patient and she started having headaches to back of head after fluid had been drawn for LP, nauseated, head has been pounding by the end of the day at work,  has to be in bed all weekends. She wants to know is this normal? Please call her on work phone [phone], goes to lunch at 12:00. 

## 2013-03-28 ENCOUNTER — Ambulatory Visit (INDEPENDENT_AMBULATORY_CARE_PROVIDER_SITE_OTHER): Payer: BC Managed Care – PPO | Admitting: *Deleted

## 2013-03-28 ENCOUNTER — Encounter: Payer: Self-pay | Admitting: Neurology

## 2013-03-28 ENCOUNTER — Ambulatory Visit (INDEPENDENT_AMBULATORY_CARE_PROVIDER_SITE_OTHER): Payer: BC Managed Care – PPO | Admitting: Neurology

## 2013-03-28 VITALS — BP 141/91 | HR 94 | Temp 97.9°F | Ht 65.0 in | Wt 227.0 lb

## 2013-03-28 DIAGNOSIS — G43719 Chronic migraine without aura, intractable, without status migrainosus: Secondary | ICD-10-CM

## 2013-03-28 DIAGNOSIS — Z0289 Encounter for other administrative examinations: Secondary | ICD-10-CM

## 2013-03-28 DIAGNOSIS — G43909 Migraine, unspecified, not intractable, without status migrainosus: Secondary | ICD-10-CM

## 2013-03-28 DIAGNOSIS — R519 Headache, unspecified: Secondary | ICD-10-CM

## 2013-03-28 DIAGNOSIS — R51 Headache: Secondary | ICD-10-CM

## 2013-03-28 DIAGNOSIS — G932 Benign intracranial hypertension: Secondary | ICD-10-CM

## 2013-03-28 MED ORDER — PROMETHAZINE HCL 25 MG PO TABS: 25.0000 mg | ORAL_TABLET | Freq: Three times a day (TID) | ORAL | Status: DC | PRN

## 2013-03-28 MED ORDER — CYCLOBENZAPRINE HCL 10 MG PO TABS: 10.0000 mg | ORAL_TABLET | Freq: Every day | ORAL | Status: DC

## 2013-03-28 MED ORDER — KETOROLAC TROMETHAMINE 30 MG/ML IJ SOLN
30.0000 mg | Freq: Once | INTRAMUSCULAR | Status: AC
  Administered 2013-03-28: 30 mg via INTRAMUSCULAR

## 2013-03-28 NOTE — Progress Notes (Signed)
Under aseptic technique Toradol 30mg  IM given to R gluteal.  Tolerated well.  Bandage applied.
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
## 2013-03-28 NOTE — Patient Instructions (Signed)
Pt to check out.   To call back as needed.

## 2013-03-28 NOTE — Patient Instructions (Addendum)
Please remember, common headache triggers are: sleep deprivation, dehydration, overheating, stress, hypoglycemia or skipping meals and blood sugar fluctuations, excessive pain medications or excessive alcohol use or caffeine withdrawal. Some people have food triggers such as aged cheese, orange juice or chocolate, especially dark chocolate, or MSG (monosodium glutamate). Try to avoid these headache triggers as much possible. It may be helpful to keep a headache diary to figure out what makes your headaches worse or brings them on and what alleviates them. Some people report headache onset after exercise but studies have shown that regular exercise may actually prevent headaches from coming. If you have exercise-induced headaches, please make sure that you drink plenty of fluid before and after exercising and that you do not over do it and do not overheat.  We will do the following today:  1. Let's reduce the Diamox a little bit by having you take one pill in the morning and 2 at night for a total of 1500 mg.  2. Let's do MRA/MRV head, which looks at the arteries and veins of your head. We will also do a neck MRI, given your prior Hx of neck pain.  3. Try Flexeril again.  4. We will consider Botox injections.  5. Make an appointment with Dr. Burgess Estelleanner next avail.  6. Keep your appt with me in April.

## 2013-03-29 ENCOUNTER — Telehealth: Payer: Self-pay | Admitting: Neurology

## 2013-03-29 NOTE — Telephone Encounter (Signed)
Spoke with patient and she said that she went to see Pcp and he gave her two shots(phenergan, 60 mg of toradol), will see how it works, will call back as needed

## 2013-03-29 NOTE — Telephone Encounter (Signed)
Unfortunately, at this point, I believe she has to go to the emergency room for consideration of more stronger pain medication and IV medication for her nausea. She may also need IV fluids at this time if she is nauseated.

## 2013-03-29 NOTE — Telephone Encounter (Signed)
Patient said that the toradol-30 mg did not help with headaches, caused her to be nauseated/ throwing up, head is pounding.

## 2013-03-29 NOTE — Telephone Encounter (Signed)
Pt called and stated that the shot she was given yesterday didn't even touch the migraine.  She stated that she has had this migraine since Sunday and this morning at 2:00 AM she started throwing up.  She called her PCP and they need to know what medicine was administered and the dosage.  Please call.

## 2013-04-06 ENCOUNTER — Telehealth: Payer: Self-pay | Admitting: Neurology

## 2013-04-06 NOTE — Telephone Encounter (Signed)
This patient was denied by her insurance to have Botox injections for Chronic Migraine, reason-does not meet the definition of Medical Necessity according to the patients benefit package

## 2013-04-13 ENCOUNTER — Ambulatory Visit (INDEPENDENT_AMBULATORY_CARE_PROVIDER_SITE_OTHER): Payer: BC Managed Care – PPO

## 2013-04-13 DIAGNOSIS — G43719 Chronic migraine without aura, intractable, without status migrainosus: Secondary | ICD-10-CM

## 2013-04-13 DIAGNOSIS — R51 Headache: Secondary | ICD-10-CM

## 2013-04-13 DIAGNOSIS — G43909 Migraine, unspecified, not intractable, without status migrainosus: Secondary | ICD-10-CM

## 2013-04-13 DIAGNOSIS — G932 Benign intracranial hypertension: Secondary | ICD-10-CM

## 2013-04-13 DIAGNOSIS — R519 Headache, unspecified: Secondary | ICD-10-CM

## 2013-04-14 ENCOUNTER — Other Ambulatory Visit: Payer: Self-pay | Admitting: Diagnostic Neuroimaging

## 2013-04-14 DIAGNOSIS — M542 Cervicalgia: Secondary | ICD-10-CM

## 2013-04-14 MED ORDER — GADOPENTETATE DIMEGLUMINE 469.01 MG/ML IV SOLN: 20.0000 mL | Freq: Once | INTRAVENOUS | Status: AC | PRN

## 2013-04-15 NOTE — Progress Notes (Signed)
Quick Note:  Please call patient regarding her recent neck and brain scans: Her MR angiogram showed normal findings, her cervical spine MRI showed minimal disc bulging and no significant neck degenerative disease. Her MR venogram which looks at the venous structures of the brain showed no sinister findings such as a clot. However some of the draining structures that help drain the venous blood in the brain showed some tightness, also known as stenosis. This can be a normal variant but can also be seen in patients with pseudotumor cerebri and is therefore in keeping with her diagnosis. No acute or concerning findings overall in any of these tests. Unfortunately, the patient's insurance denied Botox injections for headaches. She is probably aware of that. Huston FoleySaima Lashonda Sonneborn, MD, PhD [facility] ([facility]  ______

## 2013-04-18 ENCOUNTER — Encounter: Payer: Self-pay | Admitting: Neurology

## 2013-05-25 NOTE — Telephone Encounter (Signed)
Closing encounter

## 2013-06-07 ENCOUNTER — Encounter: Payer: Self-pay | Admitting: Neurology

## 2013-06-07 ENCOUNTER — Ambulatory Visit (INDEPENDENT_AMBULATORY_CARE_PROVIDER_SITE_OTHER): Payer: BC Managed Care – PPO | Admitting: Neurology

## 2013-06-07 VITALS — BP 135/84 | HR 80 | Temp 98.6°F | Ht 65.0 in | Wt 229.0 lb

## 2013-06-07 DIAGNOSIS — R42 Dizziness and giddiness: Secondary | ICD-10-CM

## 2013-06-07 DIAGNOSIS — G43719 Chronic migraine without aura, intractable, without status migrainosus: Secondary | ICD-10-CM

## 2013-06-07 DIAGNOSIS — R51 Headache: Secondary | ICD-10-CM

## 2013-06-07 DIAGNOSIS — N200 Calculus of kidney: Secondary | ICD-10-CM

## 2013-06-07 DIAGNOSIS — G43119 Migraine with aura, intractable, without status migrainosus: Secondary | ICD-10-CM

## 2013-06-07 DIAGNOSIS — M542 Cervicalgia: Secondary | ICD-10-CM

## 2013-06-07 DIAGNOSIS — G43909 Migraine, unspecified, not intractable, without status migrainosus: Secondary | ICD-10-CM

## 2013-06-07 DIAGNOSIS — G932 Benign intracranial hypertension: Secondary | ICD-10-CM

## 2013-06-07 DIAGNOSIS — R519 Headache, unspecified: Secondary | ICD-10-CM

## 2013-06-07 DIAGNOSIS — E669 Obesity, unspecified: Secondary | ICD-10-CM

## 2013-06-07 DIAGNOSIS — M62838 Other muscle spasm: Secondary | ICD-10-CM

## 2013-06-07 MED ORDER — PROMETHAZINE HCL 25 MG PO TABS: 25.0000 mg | ORAL_TABLET | Freq: Three times a day (TID) | ORAL | Status: DC | PRN

## 2013-06-07 NOTE — Patient Instructions (Signed)
We will re-submit botox authorization for intractable migraines. In the meantime, continue diamox at the current dose of 1500 mg daily. Sandra KillingsJanisha should be back in touch with you directly regarding botox injections.
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
## 2013-07-05 ENCOUNTER — Telehealth: Payer: Self-pay | Admitting: Neurology

## 2013-07-05 NOTE — Telephone Encounter (Signed)
I received a call from this patient stating that if she did not receive a call from me within two weeks to call me. I am not sure why.

## 2013-07-05 NOTE — Telephone Encounter (Signed)
Information was given to Dr. Teofilo PodAthar's assistant Brett CanalesSteve, CMA

## 2013-08-01 ENCOUNTER — Ambulatory Visit (INDEPENDENT_AMBULATORY_CARE_PROVIDER_SITE_OTHER): Payer: BC Managed Care – PPO | Admitting: Neurology

## 2013-08-01 ENCOUNTER — Encounter: Payer: Self-pay | Admitting: Neurology

## 2013-08-01 VITALS — BP 124/73 | HR 71 | Temp 98.4°F | Ht 65.0 in

## 2013-08-01 DIAGNOSIS — G43119 Migraine with aura, intractable, without status migrainosus: Secondary | ICD-10-CM

## 2013-08-01 DIAGNOSIS — G43719 Chronic migraine without aura, intractable, without status migrainosus: Secondary | ICD-10-CM

## 2013-08-01 MED ORDER — ONABOTULINUMTOXINA 100 UNITS IJ SOLR
100.0000 [IU] | Freq: Once | INTRAMUSCULAR | Status: AC
  Administered 2013-08-01: 100 [IU] via INTRAMUSCULAR

## 2013-08-01 NOTE — Patient Instructions (Signed)
As discussed, botulinum toxin takes about 3-7 days to kick in. Please remember, this is not a pain shot, this is to gradually improve your symptoms. In some patients it takes up to 2-3 weeks to make a difference and it wears off with time. Sometimes it may wear off before it is time for the next injection. We still should wait till the next 3 monthly injection, because injecting too frequently may cause you to develop immunity to the botulinum toxin. We are looking for a reduction in your headache frequency and headache severity. Side effects to look out for are mouth dryness, dryness of the eyes, heaviness of your head or muscle weakness, rarely, speech or swallowing difficulties and very rarely breathing difficulties. Some people have transient neck pain or soreness which typically responds to over-the-counter anti-inflammatory medication and local heat application with a heat pad. If you think you have a severe reaction to the botulinum toxin, such as weakness, trouble speaking, trouble breathing, or trouble swallowing, you have to call 911 or have someone take you to the nearest emergency room. However, most people have no side effects from the injections. It is normal to have a little bit of redness and swelling around the injection sites which usually improves after a few hours. Rarely, there may be a bruise that improves on its own. Most side effects reported are very mild and resolve within 10-14 days. Please feel free to call us if you have any additional questions or concerns: [phone]. Please give us a call in about a month to report how you are doing after the botulinum toxin injections or to just give us an update. Call sooner, if you have any concerns, questions or feel that you have potential side effects. We may have to adjust the dose over time, depending on your results from this injection and your overall response over time to this medication.   
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
## 2013-10-12 ENCOUNTER — Other Ambulatory Visit: Payer: Self-pay | Admitting: Neurology

## 2013-11-10 ENCOUNTER — Other Ambulatory Visit: Payer: Self-pay | Admitting: Physical Medicine and Rehabilitation

## 2013-11-10 DIAGNOSIS — M5126 Other intervertebral disc displacement, lumbar region: Secondary | ICD-10-CM

## 2013-11-10 DIAGNOSIS — M5030 Other cervical disc degeneration, unspecified cervical region: Secondary | ICD-10-CM

## 2013-11-19 ENCOUNTER — Ambulatory Visit
Admission: RE | Admit: 2013-11-19 | Discharge: 2013-11-19 | Disposition: A | Discharge status: Home / Self Care | Payer: BC Managed Care – PPO | Source: Ambulatory Visit | Attending: Physical Medicine and Rehabilitation | Admitting: Physical Medicine and Rehabilitation

## 2013-11-19 DIAGNOSIS — M5030 Other cervical disc degeneration, unspecified cervical region: Secondary | ICD-10-CM

## 2013-11-19 DIAGNOSIS — M5126 Other intervertebral disc displacement, lumbar region: Secondary | ICD-10-CM

## 2014-01-05 ENCOUNTER — Other Ambulatory Visit (HOSPITAL_COMMUNITY): Payer: Self-pay | Admitting: Neurosurgery

## 2014-01-10 ENCOUNTER — Encounter (HOSPITAL_COMMUNITY): Payer: Self-pay

## 2014-01-10 ENCOUNTER — Encounter (HOSPITAL_COMMUNITY)
Admission: RE | Admit: 2014-01-10 | Discharge: 2014-01-10 | Disposition: A | Discharge status: Home / Self Care | Payer: BC Managed Care – PPO | Source: Ambulatory Visit | Attending: Anesthesiology | Admitting: Anesthesiology

## 2014-01-10 ENCOUNTER — Encounter (HOSPITAL_COMMUNITY)
Admission: RE | Admit: 2014-01-10 | Discharge: 2014-01-10 | Disposition: A | Discharge status: Home / Self Care | Payer: BC Managed Care – PPO | Source: Ambulatory Visit | Attending: Neurosurgery | Admitting: Neurosurgery

## 2014-01-10 DIAGNOSIS — Z01818 Encounter for other preprocedural examination: Secondary | ICD-10-CM

## 2014-01-10 HISTORY — DX: Adverse effect of unspecified anesthetic, initial encounter: T41.45XA

## 2014-01-10 HISTORY — DX: Dizziness and giddiness: R42

## 2014-01-10 HISTORY — DX: Personal history of other diseases of the digestive system: Z87.19

## 2014-01-10 HISTORY — DX: Nausea with vomiting, unspecified: R11.2

## 2014-01-10 HISTORY — DX: Personal history of other drug therapy: Z92.29

## 2014-01-10 HISTORY — DX: Other specified postprocedural states: Z98.890

## 2014-01-10 LAB — CBC
HCT: 39.9 % (ref 36.0–46.0)
Hemoglobin: 13 g/dL (ref 12.0–15.0)
MCH: 30 pg (ref 26.0–34.0)
MCHC: 32.6 g/dL (ref 30.0–36.0)
MCV: 91.9 fL (ref 78.0–100.0)
PLATELETS: 182 10*3/uL (ref 150–400)
RBC: 4.34 MIL/uL (ref 3.87–5.11)
RDW: 13 % (ref 11.5–15.5)
WBC: 6.1 10*3/uL (ref 4.0–10.5)

## 2014-01-10 LAB — COMPREHENSIVE METABOLIC PANEL
ALK PHOS: 99 U/L (ref 39–117)
ALT: 15 U/L (ref 0–35)
AST: 13 U/L (ref 0–37)
Albumin: 3.3 g/dL — ABNORMAL LOW (ref 3.5–5.2)
Anion gap: 11 (ref 5–15)
BUN: 16 mg/dL (ref 6–23)
CO2: 21 mEq/L (ref 19–32)
Calcium: 9 mg/dL (ref 8.4–10.5)
Chloride: 108 mEq/L (ref 96–112)
Creatinine, Ser: 0.91 mg/dL (ref 0.50–1.10)
GFR calc Af Amer: >90 mL/min (ref >90)
GFR calc non Af Amer: 78 mL/min — ABNORMAL LOW (ref >90)
Glucose, Bld: 95 mg/dL (ref 70–99)
POTASSIUM: 4 meq/L (ref 3.7–5.3)
SODIUM: 140 meq/L (ref 137–147)
TOTAL PROTEIN: 7.1 g/dL (ref 6.0–8.3)
Total Bilirubin: 0.2 mg/dL — ABNORMAL LOW (ref 0.3–1.2)

## 2014-01-10 LAB — SURGICAL PCR SCREEN
MRSA, PCR: POSITIVE — AB
STAPHYLOCOCCUS AUREUS: POSITIVE — AB

## 2014-01-10 NOTE — Progress Notes (Signed)
Always had irregular hr....started seeing a cardio - Dr. Andee LinemaneGent 5 yrs ago, cardiac cath done in ? 2010.  Diagnosis of POTS came about.  Last saw him around 3 yrs ago.  Her PCP Vyas handles her HR meds now.  Periodic racing of heart, but then it goes away.

## 2014-01-10 NOTE — Pre-Procedure Instructions (Signed)
Sandra PomfretChristy T [name]  01/10/2014   Your procedure is scheduled on:  Wednesday, Nov. 18th   Report to [facility] Admitting at 1:00 PM             (Arrival time is per your surgeon's request.)   Call this number if you have problems the morning of surgery: [phone]   Remember:   Do not eat food or drink liquids after midnight tonight.    Take these medicines the morning of surgery with A SIP OF WATER: Prozac, Protonix, Maxalt (if needed), Pain medication   Do not wear jewelry, make-up or nail polish.  Do not wear lotions, powders, or perfumes. You may NOT wear deodorant.  Do not shave underarms & legs 48 hours prior to surgery.    Do not bring valuables to the hospital.  [facility] is not responsible for any belongings or valuables.               Contacts, dentures or bridgework may not be worn into surgery.  Leave suitcase in the car. After surgery it may be brought to your room.  For patients admitted to the hospital, discharge time is determined by your treatment team.    Name and phone number of your driver:    Special Instructions: "Preparing for Surgery" instruction sheet   Please read over the following fact sheets that you were given: Pain Booklet, Coughing and Deep Breathing, MRSA Information and Surgical Site Infection Prevention

## 2014-01-11 ENCOUNTER — Ambulatory Visit (HOSPITAL_COMMUNITY): Payer: BC Managed Care – PPO | Admitting: Vascular Surgery

## 2014-01-11 ENCOUNTER — Ambulatory Visit (HOSPITAL_COMMUNITY): Payer: BC Managed Care – PPO

## 2014-01-11 ENCOUNTER — Encounter (HOSPITAL_COMMUNITY): Payer: Self-pay | Admitting: Anesthesiology

## 2014-01-11 ENCOUNTER — Ambulatory Visit (HOSPITAL_COMMUNITY): Payer: BC Managed Care – PPO | Admitting: Anesthesiology

## 2014-01-11 ENCOUNTER — Encounter (HOSPITAL_COMMUNITY)
Admission: RE | Disposition: A | Discharge status: Home / Self Care | Payer: Self-pay | Source: Ambulatory Visit | Attending: Neurosurgery

## 2014-01-11 ENCOUNTER — Inpatient Hospital Stay (HOSPITAL_COMMUNITY)
Admission: RE | Admit: 2014-01-11 | Discharge: 2014-01-12 | DRG: 520 | Disposition: A | Discharge status: Home / Self Care | Payer: BC Managed Care – PPO | Source: Ambulatory Visit | Attending: Neurosurgery | Admitting: Neurosurgery

## 2014-01-11 DIAGNOSIS — M797 Fibromyalgia: Secondary | ICD-10-CM | POA: Diagnosis present

## 2014-01-11 DIAGNOSIS — Z23 Encounter for immunization: Secondary | ICD-10-CM | POA: Diagnosis not present

## 2014-01-11 DIAGNOSIS — F329 Major depressive disorder, single episode, unspecified: Secondary | ICD-10-CM | POA: Diagnosis present

## 2014-01-11 DIAGNOSIS — I10 Essential (primary) hypertension: Secondary | ICD-10-CM | POA: Diagnosis present

## 2014-01-11 DIAGNOSIS — M79605 Pain in left leg: Secondary | ICD-10-CM | POA: Diagnosis present

## 2014-01-11 DIAGNOSIS — F419 Anxiety disorder, unspecified: Secondary | ICD-10-CM | POA: Diagnosis present

## 2014-01-11 DIAGNOSIS — K219 Gastro-esophageal reflux disease without esophagitis: Secondary | ICD-10-CM | POA: Diagnosis present

## 2014-01-11 DIAGNOSIS — M5116 Intervertebral disc disorders with radiculopathy, lumbar region: Secondary | ICD-10-CM | POA: Diagnosis present

## 2014-01-11 DIAGNOSIS — Z79899 Other long term (current) drug therapy: Secondary | ICD-10-CM

## 2014-01-11 DIAGNOSIS — M5126 Other intervertebral disc displacement, lumbar region: Secondary | ICD-10-CM | POA: Diagnosis present

## 2014-01-11 DIAGNOSIS — IMO0002 Reserved for concepts with insufficient information to code with codable children: Secondary | ICD-10-CM

## 2014-01-11 HISTORY — PX: LUMBAR LAMINECTOMY/DECOMPRESSION MICRODISCECTOMY: SHX5026

## 2014-01-11 SURGERY — LUMBAR LAMINECTOMY/DECOMPRESSION MICRODISCECTOMY 1 LEVEL
Anesthesia: General | Laterality: Left

## 2014-01-11 MED ORDER — MECLIZINE HCL 25 MG PO TABS
25.0000 mg | ORAL_TABLET | Freq: Every day | ORAL | Status: DC | PRN
  Filled 2014-01-11: qty 1

## 2014-01-11 MED ORDER — CEFAZOLIN SODIUM-DEXTROSE 2-3 GM-% IV SOLR
INTRAVENOUS | Status: AC
  Filled 2014-01-11: qty 50

## 2014-01-11 MED ORDER — OXYCODONE-ACETAMINOPHEN 10-325 MG PO TABS: 1.0000 | ORAL_TABLET | ORAL | Status: DC | PRN

## 2014-01-11 MED ORDER — PROMETHAZINE HCL 25 MG PO TABS
25.0000 mg | ORAL_TABLET | Freq: Three times a day (TID) | ORAL | Status: DC | PRN
  Administered 2014-01-11: 25 mg via ORAL
  Filled 2014-01-11: qty 1

## 2014-01-11 MED ORDER — ONDANSETRON HCL 4 MG/2ML IJ SOLN
INTRAMUSCULAR | Status: AC
  Filled 2014-01-11: qty 2

## 2014-01-11 MED ORDER — NEOSTIGMINE METHYLSULFATE 10 MG/10ML IV SOLN
INTRAVENOUS | Status: DC | PRN
  Administered 2014-01-11: 5 mg via INTRAVENOUS

## 2014-01-11 MED ORDER — 0.9 % SODIUM CHLORIDE (POUR BTL) OPTIME
TOPICAL | Status: DC | PRN
  Administered 2014-01-11: 1000 mL

## 2014-01-11 MED ORDER — ONDANSETRON HCL 4 MG/2ML IJ SOLN
INTRAMUSCULAR | Status: DC | PRN
  Administered 2014-01-11: 4 mg via INTRAVENOUS

## 2014-01-11 MED ORDER — DEXAMETHASONE SODIUM PHOSPHATE 10 MG/ML IJ SOLN
INTRAMUSCULAR | Status: DC | PRN
  Administered 2014-01-11: 10 mg via INTRAVENOUS

## 2014-01-11 MED ORDER — THROMBIN 5000 UNITS EX SOLR
CUTANEOUS | Status: DC | PRN
  Administered 2014-01-11 (×2): 5000 [IU] via TOPICAL

## 2014-01-11 MED ORDER — DOXEPIN HCL 10 MG PO CAPS
30.0000 mg | ORAL_CAPSULE | Freq: Every day | ORAL | Status: DC
  Administered 2014-01-11: 30 mg via ORAL
  Filled 2014-01-11 (×2): qty 3

## 2014-01-11 MED ORDER — LACTATED RINGERS IV SOLN
INTRAVENOUS | Status: DC
  Administered 2014-01-11: 13:00:00 via INTRAVENOUS

## 2014-01-11 MED ORDER — MENTHOL 3 MG MT LOZG: 1.0000 | LOZENGE | OROMUCOSAL | Status: DC | PRN

## 2014-01-11 MED ORDER — VANCOMYCIN HCL 1000 MG IV SOLR
INTRAVENOUS | Status: AC
  Administered 2014-01-11: 1000 mg
  Filled 2014-01-11: qty 1000

## 2014-01-11 MED ORDER — MUPIROCIN 2 % EX OINT
1.0000 "application " | TOPICAL_OINTMENT | Freq: Two times a day (BID) | CUTANEOUS | Status: DC
  Administered 2014-01-11 – 2014-01-12 (×2): 1 via NASAL
  Filled 2014-01-11: qty 22

## 2014-01-11 MED ORDER — PANTOPRAZOLE SODIUM 40 MG PO TBEC: 40.0000 mg | DELAYED_RELEASE_TABLET | Freq: Every day | ORAL | Status: DC | PRN

## 2014-01-11 MED ORDER — OXYCODONE-ACETAMINOPHEN 5-325 MG PO TABS: 1.0000 | ORAL_TABLET | ORAL | Status: DC | PRN

## 2014-01-11 MED ORDER — GLYCOPYRROLATE 0.2 MG/ML IJ SOLN
INTRAMUSCULAR | Status: DC | PRN
  Administered 2014-01-11: .8 mg via INTRAVENOUS

## 2014-01-11 MED ORDER — DIAZEPAM 5 MG PO TABS
ORAL_TABLET | ORAL | Status: AC
  Administered 2014-01-12: 5 mg via ORAL
  Filled 2014-01-11: qty 1

## 2014-01-11 MED ORDER — SODIUM CHLORIDE 0.9 % IR SOLN
Status: DC | PRN
  Administered 2014-01-11: 17:00:00

## 2014-01-11 MED ORDER — DOCUSATE SODIUM 100 MG PO CAPS
100.0000 mg | ORAL_CAPSULE | Freq: Two times a day (BID) | ORAL | Status: DC
  Administered 2014-01-11 – 2014-01-12 (×2): 100 mg via ORAL
  Filled 2014-01-11 (×3): qty 1

## 2014-01-11 MED ORDER — OXYCODONE HCL 5 MG PO TABS: 5.0000 mg | ORAL_TABLET | ORAL | Status: DC | PRN

## 2014-01-11 MED ORDER — PROPOFOL 10 MG/ML IV BOLUS
INTRAVENOUS | Status: AC
  Filled 2014-01-11: qty 20

## 2014-01-11 MED ORDER — ONDANSETRON HCL 4 MG/2ML IJ SOLN: 4.0000 mg | INTRAMUSCULAR | Status: DC | PRN

## 2014-01-11 MED ORDER — FENTANYL CITRATE 0.05 MG/ML IJ SOLN
INTRAMUSCULAR | Status: AC
  Administered 2014-01-11: 50 ug via INTRAVENOUS
  Filled 2014-01-11: qty 2

## 2014-01-11 MED ORDER — ALUM & MAG HYDROXIDE-SIMETH 200-200-20 MG/5ML PO SUSP: 30.0000 mL | Freq: Four times a day (QID) | ORAL | Status: DC | PRN

## 2014-01-11 MED ORDER — HYDROMORPHONE HCL 2 MG PO TABS
4.0000 mg | ORAL_TABLET | ORAL | Status: DC | PRN
Start: 2014-01-11 — End: 2014-01-12
  Administered 2014-01-11 – 2014-01-12 (×5): 4 mg via ORAL
  Filled 2014-01-11 (×5): qty 2

## 2014-01-11 MED ORDER — MIDAZOLAM HCL 2 MG/2ML IJ SOLN
INTRAMUSCULAR | Status: AC
  Filled 2014-01-11: qty 2

## 2014-01-11 MED ORDER — MIDAZOLAM HCL 5 MG/5ML IJ SOLN
INTRAMUSCULAR | Status: DC | PRN
  Administered 2014-01-11: 2 mg via INTRAVENOUS

## 2014-01-11 MED ORDER — LACTATED RINGERS IV SOLN: INTRAVENOUS | Status: DC

## 2014-01-11 MED ORDER — CHLORHEXIDINE GLUCONATE CLOTH 2 % EX PADS
6.0000 | MEDICATED_PAD | Freq: Every day | CUTANEOUS | Status: DC
  Administered 2014-01-12: 6 via TOPICAL

## 2014-01-11 MED ORDER — ACETAZOLAMIDE ER 500 MG PO CP12
1000.0000 mg | ORAL_CAPSULE | Freq: Two times a day (BID) | ORAL | Status: DC
  Administered 2014-01-11 – 2014-01-12 (×2): 1000 mg via ORAL
  Filled 2014-01-11 (×3): qty 2

## 2014-01-11 MED ORDER — MORPHINE SULFATE 2 MG/ML IJ SOLN
1.0000 mg | INTRAMUSCULAR | Status: DC | PRN
  Administered 2014-01-11: 4 mg via INTRAVENOUS
  Filled 2014-01-11: qty 2

## 2014-01-11 MED ORDER — INFLUENZA VAC SPLIT QUAD 0.5 ML IM SUSY
0.5000 mL | PREFILLED_SYRINGE | INTRAMUSCULAR | Status: AC
  Administered 2014-01-12: 0.5 mL via INTRAMUSCULAR
  Filled 2014-01-11 (×2): qty 0.5

## 2014-01-11 MED ORDER — HEMOSTATIC AGENTS (NO CHARGE) OPTIME
TOPICAL | Status: DC | PRN
  Administered 2014-01-11: 1 via TOPICAL

## 2014-01-11 MED ORDER — DIAZEPAM 5 MG PO TABS
5.0000 mg | ORAL_TABLET | Freq: Four times a day (QID) | ORAL | Status: DC | PRN
  Administered 2014-01-11 – 2014-01-12 (×4): 5 mg via ORAL
  Filled 2014-01-11 (×3): qty 1

## 2014-01-11 MED ORDER — FENTANYL CITRATE 0.05 MG/ML IJ SOLN
INTRAMUSCULAR | Status: DC | PRN
  Administered 2014-01-11: 100 ug via INTRAVENOUS
  Administered 2014-01-11: 50 ug via INTRAVENOUS

## 2014-01-11 MED ORDER — FENTANYL CITRATE 0.05 MG/ML IJ SOLN
INTRAMUSCULAR | Status: AC
  Filled 2014-01-11: qty 5

## 2014-01-11 MED ORDER — VANCOMYCIN HCL 10 G IV SOLR
1500.0000 mg | Freq: Once | INTRAVENOUS | Status: AC
  Administered 2014-01-12: 1500 mg via INTRAVENOUS
  Filled 2014-01-11: qty 1500

## 2014-01-11 MED ORDER — BUPIVACAINE-EPINEPHRINE (PF) 0.5% -1:200000 IJ SOLN
INTRAMUSCULAR | Status: DC | PRN
  Administered 2014-01-11: 10 mL via PERINEURAL

## 2014-01-11 MED ORDER — PHENOL 1.4 % MT LIQD: 1.0000 | OROMUCOSAL | Status: DC | PRN

## 2014-01-11 MED ORDER — FENTANYL CITRATE 0.05 MG/ML IJ SOLN
INTRAMUSCULAR | Status: AC
  Filled 2014-01-11: qty 2

## 2014-01-11 MED ORDER — ONDANSETRON HCL 4 MG/2ML IJ SOLN
4.0000 mg | Freq: Once | INTRAMUSCULAR | Status: AC | PRN
  Administered 2014-01-11: 4 mg via INTRAVENOUS

## 2014-01-11 MED ORDER — SCOPOLAMINE 1 MG/3DAYS TD PT72
MEDICATED_PATCH | TRANSDERMAL | Status: DC | PRN
  Administered 2014-01-11: 1 via TRANSDERMAL

## 2014-01-11 MED ORDER — ROCURONIUM BROMIDE 100 MG/10ML IV SOLN
INTRAVENOUS | Status: DC | PRN
  Administered 2014-01-11: 10 mg via INTRAVENOUS
  Administered 2014-01-11: 40 mg via INTRAVENOUS

## 2014-01-11 MED ORDER — SUMATRIPTAN SUCCINATE 50 MG PO TABS
50.0000 mg | ORAL_TABLET | Freq: Every day | ORAL | Status: DC | PRN
  Filled 2014-01-11: qty 1

## 2014-01-11 MED ORDER — FLUOXETINE HCL 20 MG PO CAPS
40.0000 mg | ORAL_CAPSULE | Freq: Every day | ORAL | Status: DC
  Administered 2014-01-11 – 2014-01-12 (×2): 40 mg via ORAL
  Filled 2014-01-11 (×2): qty 2

## 2014-01-11 MED ORDER — LACTATED RINGERS IV SOLN
INTRAVENOUS | Status: DC | PRN
  Administered 2014-01-11: 16:00:00 via INTRAVENOUS

## 2014-01-11 MED ORDER — PROPOFOL 10 MG/ML IV BOLUS
INTRAVENOUS | Status: DC | PRN
  Administered 2014-01-11: 200 mg via INTRAVENOUS

## 2014-01-11 MED ORDER — IBUPROFEN 600 MG PO TABS
600.0000 mg | ORAL_TABLET | Freq: Four times a day (QID) | ORAL | Status: DC | PRN
  Filled 2014-01-11: qty 1

## 2014-01-11 MED ORDER — LIDOCAINE HCL (CARDIAC) 20 MG/ML IV SOLN
INTRAVENOUS | Status: DC | PRN
  Administered 2014-01-11: 50 mg via INTRAVENOUS

## 2014-01-11 MED ORDER — RIZATRIPTAN BENZOATE 5 MG PO TBDP: 5.0000 mg | ORAL_TABLET | Freq: Every day | ORAL | Status: DC | PRN

## 2014-01-11 MED ORDER — FENTANYL CITRATE 0.05 MG/ML IJ SOLN
25.0000 ug | INTRAMUSCULAR | Status: DC | PRN
  Administered 2014-01-11 (×3): 50 ug via INTRAVENOUS

## 2014-01-11 SURGICAL SUPPLY — 55 items
BAG DECANTER FOR FLEXI CONT (MISCELLANEOUS) ×3 IMPLANT
BENZOIN TINCTURE PRP APPL 2/3 (GAUZE/BANDAGES/DRESSINGS) ×3 IMPLANT
BLADE CLIPPER SURG (BLADE) IMPLANT
BRUSH SCRUB EZ PLAIN DRY (MISCELLANEOUS) ×3 IMPLANT
BUR MATCHSTICK NEURO 3.0 LAGG (BURR) ×3 IMPLANT
BUR PRECISION FLUTE 6.0 (BURR) ×3 IMPLANT
CANISTER SUCT 3000ML (MISCELLANEOUS) ×3 IMPLANT
CLOSURE WOUND 1/2 X4 (GAUZE/BANDAGES/DRESSINGS) ×1
CONT SPEC 4OZ CLIKSEAL STRL BL (MISCELLANEOUS) ×3 IMPLANT
DRAPE LAPAROTOMY 100X72X124 (DRAPES) ×3 IMPLANT
DRAPE MICROSCOPE LEICA (MISCELLANEOUS) ×3 IMPLANT
DRAPE POUCH INSTRU U-SHP 10X18 (DRAPES) ×3 IMPLANT
DRAPE SURG 17X23 STRL (DRAPES) ×12 IMPLANT
ELECT BLADE 4.0 EZ CLEAN MEGAD (MISCELLANEOUS) ×3
ELECT REM PT RETURN 9FT ADLT (ELECTROSURGICAL) ×3
ELECTRODE BLDE 4.0 EZ CLN MEGD (MISCELLANEOUS) ×1 IMPLANT
ELECTRODE REM PT RTRN 9FT ADLT (ELECTROSURGICAL) ×1 IMPLANT
GAUZE SPONGE 4X4 12PLY STRL (GAUZE/BANDAGES/DRESSINGS) ×3 IMPLANT
GAUZE SPONGE 4X4 16PLY XRAY LF (GAUZE/BANDAGES/DRESSINGS) IMPLANT
GLOVE BIO SURGEON STRL SZ8 (GLOVE) ×3 IMPLANT
GLOVE BIO SURGEON STRL SZ8.5 (GLOVE) ×3 IMPLANT
GLOVE BIOGEL PI IND STRL 7.5 (GLOVE) ×1 IMPLANT
GLOVE BIOGEL PI INDICATOR 7.5 (GLOVE) ×2
GLOVE ECLIPSE 7.5 STRL STRAW (GLOVE) ×3 IMPLANT
GLOVE EXAM NITRILE LRG STRL (GLOVE) IMPLANT
GLOVE EXAM NITRILE MD LF STRL (GLOVE) IMPLANT
GLOVE EXAM NITRILE XL STR (GLOVE) IMPLANT
GLOVE EXAM NITRILE XS STR PU (GLOVE) IMPLANT
GLOVE INDICATOR 8.5 STRL (GLOVE) ×3 IMPLANT
GLOVE SS BIOGEL STRL SZ 8 (GLOVE) ×1 IMPLANT
GLOVE SUPERSENSE BIOGEL SZ 8 (GLOVE) ×2
GOWN STRL REUS W/ TWL LRG LVL3 (GOWN DISPOSABLE) IMPLANT
GOWN STRL REUS W/ TWL XL LVL3 (GOWN DISPOSABLE) ×3 IMPLANT
GOWN STRL REUS W/TWL 2XL LVL3 (GOWN DISPOSABLE) IMPLANT
GOWN STRL REUS W/TWL LRG LVL3 (GOWN DISPOSABLE)
GOWN STRL REUS W/TWL XL LVL3 (GOWN DISPOSABLE) ×6
KIT BASIN OR (CUSTOM PROCEDURE TRAY) ×3 IMPLANT
KIT ROOM TURNOVER OR (KITS) ×3 IMPLANT
NEEDLE HYPO 21X1.5 SAFETY (NEEDLE) IMPLANT
NEEDLE HYPO 22GX1.5 SAFETY (NEEDLE) ×3 IMPLANT
NS IRRIG 1000ML POUR BTL (IV SOLUTION) ×3 IMPLANT
PACK LAMINECTOMY NEURO (CUSTOM PROCEDURE TRAY) ×3 IMPLANT
PAD ARMBOARD 7.5X6 YLW CONV (MISCELLANEOUS) ×9 IMPLANT
PATTIES SURGICAL .5 X1 (DISPOSABLE) IMPLANT
RUBBERBAND STERILE (MISCELLANEOUS) ×6 IMPLANT
SPONGE SURGIFOAM ABS GEL SZ50 (HEMOSTASIS) ×3 IMPLANT
STRIP CLOSURE SKIN 1/2X4 (GAUZE/BANDAGES/DRESSINGS) ×2 IMPLANT
SUT VIC AB 1 CT1 18XBRD ANBCTR (SUTURE) ×1 IMPLANT
SUT VIC AB 1 CT1 8-18 (SUTURE) ×2
SUT VIC AB 2-0 CP2 18 (SUTURE) ×3 IMPLANT
SYR 20CC LL (SYRINGE) IMPLANT
SYR 20ML ECCENTRIC (SYRINGE) ×3 IMPLANT
TOWEL OR 17X24 6PK STRL BLUE (TOWEL DISPOSABLE) ×3 IMPLANT
TOWEL OR 17X26 10 PK STRL BLUE (TOWEL DISPOSABLE) ×3 IMPLANT
WATER STERILE IRR 1000ML POUR (IV SOLUTION) ×3 IMPLANT

## 2014-01-11 NOTE — H&P (Signed)
Subjective: The patient is a 38 year old white female who has complained of back and left leg pain consistent with a lumbar radiculopathy. She has failed medical management and was worked up with a lumbar MRI. This demonstrated a herniated disc at L2-3 on the left. I discussed the various treatment options with the patient including surgery. She has weighed the risks, benefits, and alternative surgery and decided proceed with a left L2-3 discectomy.   Past Medical History  Diagnosis Date  . Fibromyalgia   . Depression   . Anxiety   . Hypertension   . Migraines   . Chest pain     RHC/LHC in 2007 with normal PCWP and PA pressure, normal coronaries EF 55%  . Dyspnea   . POTS (postural orthostatic tachycardia syndrome)     possible, episodes of sinus tachycardis, holter in 2007 with sinus tachycardia with rates as high as 160  . Ulcer     duodenal in 1993  . GERD (gastroesophageal reflux disease)     with esophageal stricture  . Complication of anesthesia   . PONV (postoperative nausea and vomiting)     usually phenergan helps  . History of hiatal hernia     stomach ulcer ==  had one at age 70  . Esophageal dilatation     has had them 4-5 times already  . Endometriosis   . Vertigo   . S/P Botox injection     for migraines    Past Surgical History  Procedure Laterality Date  . Cholecystectomy    . Bladder surgery      bladder tact  . Tubal ligation    . Abdominal hysterectomy    . Cardiac catheterization    . Esophageal dilation    . Cystoscopy w/ ureteral stent removal      Dr. Nechama Guard in [place]  . Fluid removal      from brain twice.  last time 8-9 mths ago @  imaging    Allergies  Allergen Reactions  . Penicillins Hives and Swelling  . Topamax [Topiramate]     Gives her kidney stones  . Ambien [Zolpidem Tartrate] Rash and Other (See Comments)    Pt states she cannot walk after taking this medication  . Hydrocodone-Acetaminophen Itching    States only Vicodin  makes her itch  . Zolpidem Tartrate Itching and Rash    History  Substance Use Topics  . Smoking status: Never Smoker   . Smokeless tobacco: Not on file  . Alcohol Use: No    Family History  Problem Relation Age of Onset  . Kidney cancer Mother   . Diabetes Mother   . Prostate cancer Maternal Grandfather   . Ulcers Father   . Pancreatic cancer Maternal Grandmother   . Lung cancer Paternal Grandmother     metastatic  . Coronary artery disease Father     pci at age 41  . Aortic aneurysm     Prior to Admission medications   Medication Sig Start Date End Date Taking? Authorizing Provider  acetaZOLAMIDE (DIAMOX) 500 MG capsule Take 2 capsules (1,000 mg total) by mouth 2 (two) times daily. 1 tab (500mg ) in the morning and 2 tabs (1000mg ) nightly at bedtime for 1 week, then 2 tabs (1000mg ) twice daily thereafter. Patient taking differently: Take 1,000 mg by mouth 2 (two) times daily.  01/27/13  Yes Huston Foley, MD  cyclobenzaprine (FLEXERIL) 10 MG tablet Take 1 tablet (10 mg total) by mouth at bedtime. Patient taking differently: Take  10 mg by mouth at bedtime as needed for muscle spasms.  03/28/13  Yes Huston FoleySaima Athar, MD  doxepin (SINEQUAN) 10 MG capsule Take 30 mg by mouth at bedtime.   Yes Historical Provider, MD  FLUoxetine (PROZAC) 40 MG capsule Take 40 mg by mouth daily.   Yes Historical Provider, MD  ibuprofen (ADVIL,MOTRIN) 800 MG tablet Take 1 tablet (800 mg total) by mouth every 8 (eight) hours as needed. Patient taking differently: Take 800 mg by mouth every 8 (eight) hours as needed for mild pain.  11/11/12  Yes Ronal FearLynn E Lam, NP  ketorolac (TORADOL) 10 MG tablet Take 10 mg by mouth daily as needed for moderate pain.   Yes Historical Provider, MD  Linaclotide Karlene Einstein(LINZESS) 145 MCG CAPS capsule Take 145 mcg by mouth daily as needed (for constipation).   Yes Historical Provider, MD  methocarbamol (ROBAXIN) 500 MG tablet Take 500 mg by mouth 3 (three) times daily as needed for muscle spasms.    Yes Historical Provider, MD  oxyCODONE-acetaminophen (PERCOCET) 10-325 MG per tablet Take 1 tablet by mouth every 6 (six) hours as needed for pain.   Yes Historical Provider, MD  pantoprazole (PROTONIX) 40 MG tablet Take 40 mg by mouth daily as needed (for reflux).  08/13/12  Yes Historical Provider, MD  promethazine (PHENERGAN) 25 MG tablet Take 1 tablet (25 mg total) by mouth every 8 (eight) hours as needed for nausea or vomiting. Patient taking differently: Take 25 mg by mouth every 6 (six) hours as needed for nausea or vomiting.  06/07/13  Yes Huston FoleySaima Athar, MD  rizatriptan (MAXALT-MLT) 5 MG disintegrating tablet Take 5 mg by mouth daily as needed for migraine. May repeat in 2 hours if needed   Yes Historical Provider, MD  acetaZOLAMIDE (DIAMOX) 500 MG capsule TAKE ONE CAPSULE BY MOUTH IN THE MORNING AND TWO AT BEDTIME FOR ONE WEEK, THEN TWO CAPSULES TWICE DAILY THEREAFTER 10/12/13   Huston FoleySaima Athar, MD  meclizine (ANTIVERT) 25 MG tablet Take 25 mg by mouth daily as needed for dizziness.  05/21/12   Historical Provider, MD     Review of Systems  Positive ROS: As above  All other systems have been reviewed and were otherwise negative with the exception of those mentioned in the HPI and as above.  Objective: Vital signs in last 24 hours: Temp:  [98.2 F (36.8 C)] 98.2 F (36.8 C) (11/18 1323) Pulse Rate:  [92] 92 (11/18 1323) Resp:  [18] 18 (11/18 1323) BP: (133)/(57) 133/57 mmHg (11/18 1323) SpO2:  [99 %] 99 % (11/18 1323)  General Appearance: Alert, cooperative, no distress, Head: Normocephalic, without obvious abnormality, atraumatic Eyes: PERRL, conjunctiva/corneas clear, EOM's intact,    Ears: Normal  Throat: Normal  Neck: Supple, symmetrical, trachea midline, no adenopathy; thyroid: No enlargement/tenderness/nodules; no carotid bruit or JVD Back: Symmetric, no curvature, ROM normal, no CVA tenderness Lungs: Clear to auscultation bilaterally, respirations unlabored Heart: Regular rate  and rhythm, no murmur, rub or gallop Abdomen: Soft, non-tender,, no masses, no organomegaly Extremities: Extremities normal, atraumatic, no cyanosis or edema Pulses: 2+ and symmetric all extremities Skin: Skin color, texture, turgor normal, no rashes or lesions  NEUROLOGIC:   Mental status: alert and oriented, no aphasia, good attention span, Fund of knowledge/ memory ok Motor Exam - grossly normal Sensory Exam - grossly normal Reflexes:  Coordination - grossly normal Gait - grossly normal Balance - grossly normal Cranial Nerves: I: smell Not tested  II: visual acuity  OS: Normal  OD: Normal  II: visual fields Full to confrontation  II: pupils Equal, round, reactive to light  III,VII: ptosis None  III,IV,VI: extraocular muscles  Full ROM  V: mastication Normal  V: facial light touch sensation  Normal  V,VII: corneal reflex  Present  VII: facial muscle function - upper  Normal  VII: facial muscle function - lower Normal  VIII: hearing Not tested  IX: soft palate elevation  Normal  IX,X: gag reflex Present  XI: trapezius strength  5/5  XI: sternocleidomastoid strength 5/5  XI: neck flexion strength  5/5  XII: tongue strength  Normal    Data Review Lab Results  Component Value Date   WBC 6.1 01/10/2014   HGB 13.0 01/10/2014   HCT 39.9 01/10/2014   MCV 91.9 01/10/2014   PLT 182 01/10/2014   Lab Results  Component Value Date   NA 140 01/10/2014   K 4.0 01/10/2014   CL 108 01/10/2014   CO2 21 01/10/2014   BUN 16 01/10/2014   CREATININE 0.91 01/10/2014   GLUCOSE 95 01/10/2014   No results found for: INR, PROTIME  Assessment/Plan: Left L2-3 herniated disc, lumbago, lumbar radiculopathy: I have discussed the situation with the patient. I have reviewed her MRI scan with her and pointed out the abnormalities. We have discussed the various treatment options including surgery. I have described the surgical treatment option of a left L2-3 discectomy. I have shown her  surgical models. We have discussed the risks, benefits, alternatives, and likelihood of achieving her goals with surgery. I have answered all the patient's questions. She has decided to proceed with surgery.   Demetruis Depaul D 01/11/2014 3:27 PM

## 2014-01-11 NOTE — Progress Notes (Signed)
ANTIBIOTIC CONSULT NOTE - INITIAL  Pharmacy Consult for Vancomycin Indication: Post op surgical prophylaxis  Allergies  Allergen Reactions  . Penicillins Hives and Swelling  . Topamax [Topiramate]     Gives her kidney stones  . Ambien [Zolpidem Tartrate] Rash and Other (See Comments)    Pt states she cannot walk after taking this medication  . Hydrocodone-Acetaminophen Itching    States only Vicodin makes her itch  . Zolpidem Tartrate Itching and Rash    Patient Measurements:  Wt 113.8kg Adjusted Body Weight:  Vital Signs: Temp: 97.9 F (36.6 C) (11/18 1915) Temp Source: Oral (11/18 1323) BP: 117/62 mmHg (11/18 1915) Pulse Rate: 67 (11/18 1915) Intake/Output from previous day:   Intake/Output from this shift:    Labs:  Recent Labs  01/10/14 1242  WBC 6.1  HGB 13.0  PLT 182  CREATININE 0.91   Estimated Creatinine Clearance: 108.1 mL/min (by C-G formula based on Cr of 0.91). No results for input(s): VANCOTROUGH, VANCOPEAK, VANCORANDOM, GENTTROUGH, GENTPEAK, GENTRANDOM, TOBRATROUGH, TOBRAPEAK, TOBRARND, AMIKACINPEAK, AMIKACINTROU, AMIKACIN in the last 72 hours.   Microbiology: Recent Results (from the past 720 hour(s))  Surgical pcr screen     Status: Abnormal   Collection Time: 01/10/14 12:41 PM  Result Value Ref Range Status   MRSA, PCR POSITIVE (A) NEGATIVE Final   Staphylococcus aureus POSITIVE (A) NEGATIVE Final    Comment:        The Xpert SA Assay (FDA approved for NASAL specimens in patients over 121 years of age), is one component of a comprehensive surveillance program.  Test performance has been validated by [facility] for patients greater than or equal to 39 year old. It is not intended to diagnose infection nor to guide or monitor treatment.     Medical History: Past Medical History  Diagnosis Date  . Fibromyalgia   . Depression   . Anxiety   . Hypertension   . Migraines   . Chest pain     RHC/LHC in 2007 with normal PCWP and  PA pressure, normal coronaries EF 55%  . Dyspnea   . POTS (postural orthostatic tachycardia syndrome)     possible, episodes of sinus tachycardis, holter in 2007 with sinus tachycardia with rates as high as 160  . Ulcer     duodenal in 1993  . GERD (gastroesophageal reflux disease)     with esophageal stricture  . Complication of anesthesia   . PONV (postoperative nausea and vomiting)     usually phenergan helps  . History of hiatal hernia     stomach ulcer ==  had one at age 38  . Esophageal dilatation     has had them 4-5 times already  . Endometriosis   . Vertigo   . S/P Botox injection     for migraines    Medications:  Prescriptions prior to admission  Medication Sig Dispense Refill Last Dose  . acetaZOLAMIDE (DIAMOX) 500 MG capsule Take 2 capsules (1,000 mg total) by mouth 2 (two) times daily. 1 tab (500mg ) in the morning and 2 tabs (1000mg ) nightly at bedtime for 1 week, then 2 tabs (1000mg ) twice daily thereafter. (Patient taking differently: Take 1,000 mg by mouth 2 (two) times daily. ) 120 capsule 5 01/10/2014 at 2100  . cyclobenzaprine (FLEXERIL) 10 MG tablet Take 1 tablet (10 mg total) by mouth at bedtime. (Patient taking differently: Take 10 mg by mouth at bedtime as needed for muscle spasms. ) 30 tablet 5 Past Week at Unknown time  .  doxepin (SINEQUAN) 10 MG capsule Take 30 mg by mouth at bedtime.   Past Month at Unknown time  . FLUoxetine (PROZAC) 40 MG capsule Take 40 mg by mouth daily.   Past Week at Unknown time  . ibuprofen (ADVIL,MOTRIN) 800 MG tablet Take 1 tablet (800 mg total) by mouth every 8 (eight) hours as needed. (Patient taking differently: Take 800 mg by mouth every 8 (eight) hours as needed for mild pain. ) 90 tablet 3 Past Month at Unknown time  . ketorolac (TORADOL) 10 MG tablet Take 10 mg by mouth daily as needed for moderate pain.   Past Month at Unknown time  . Linaclotide (LINZESS) 145 MCG CAPS capsule Take 145 mcg by mouth daily as needed (for  constipation).   Past Week at Unknown time  . methocarbamol (ROBAXIN) 500 MG tablet Take 500 mg by mouth 3 (three) times daily as needed for muscle spasms.   Past Month at Unknown time  . oxyCODONE-acetaminophen (PERCOCET) 10-325 MG per tablet Take 1 tablet by mouth every 6 (six) hours as needed for pain.   01/11/2014 at 0930  . pantoprazole (PROTONIX) 40 MG tablet Take 40 mg by mouth daily as needed (for reflux).    Past Week at Unknown time  . promethazine (PHENERGAN) 25 MG tablet Take 1 tablet (25 mg total) by mouth every 8 (eight) hours as needed for nausea or vomiting. (Patient taking differently: Take 25 mg by mouth every 6 (six) hours as needed for nausea or vomiting. ) 30 tablet 3 01/11/2014 at 0930  . rizatriptan (MAXALT-MLT) 5 MG disintegrating tablet Take 5 mg by mouth daily as needed for migraine. May repeat in 2 hours if needed   01/10/2014 at Unknown time  . acetaZOLAMIDE (DIAMOX) 500 MG capsule TAKE ONE CAPSULE BY MOUTH IN THE MORNING AND TWO AT BEDTIME FOR ONE WEEK, THEN TWO CAPSULES TWICE DAILY THEREAFTER 120 capsule 3   . meclizine (ANTIVERT) 25 MG tablet Take 25 mg by mouth daily as needed for dizziness.    More than a month at Unknown time   Assessment: 38yof s/p discectomy to receive Vancomycin for surgical prophylaxis due to PCN allergy. Per Op Note, no drains were placed - will give patient 1 dose of Vancomycin per protocol. Patient received Vancomycin 1g ~1630 as pre-op. - Wt 113kg - Crcl >100 ml/min  Goal of Therapy:  Vancomycin trough level 10-15 mcg/ml  Plan:  1. Vancomycin 1.5g IV x 1 tomorrow AM (~0400) 2. Pharmacy will sign-off. Please reconsult if additional assistance is needed.  Cleon DewDulaney, Rule Robert  [phone] 01/11/2014,7:46 PM

## 2014-01-11 NOTE — Anesthesia Postprocedure Evaluation (Signed)
Anesthesia Post Note  Patient: Sandra PomfretChristy T [name]  Procedure(s) Performed: Procedure(s) (LRB): LUMBAR LAMINECTOMY/DECOMPRESSION MICRODISCECTOMY 1 LEVEL LEFT LUMBAR TWO/THREE (Left)  Anesthesia type: General  Patient location: PACU  Post pain: Pain level controlled and Adequate analgesia  Post assessment: Post-op Vital signs reviewed, Patient's Cardiovascular Status Stable, Respiratory Function Stable, Patent Airway and Pain level controlled  Last Vitals:  Filed Vitals:   01/11/14 1843  BP:   Pulse: 60  Temp:   Resp: 14    Post vital signs: Reviewed and stable  Level of consciousness: awake, alert  and oriented  Complications: No apparent anesthesia complications

## 2014-01-11 NOTE — Anesthesia Preprocedure Evaluation (Signed)
Anesthesia Evaluation  Patient identified by MRN, date of birth, ID band Patient awake    Reviewed: Allergy & Precautions, H&P , NPO status , Patient's Chart, lab work & pertinent test results  Airway Mallampati: II  TM Distance: >3 FB Neck ROM: Full    Dental  (+) Teeth Intact, Dental Advisory Given   Pulmonary  breath sounds clear to auscultation        Cardiovascular hypertension, Rhythm:Regular Rate:Normal     Neuro/Psych    GI/Hepatic   Endo/Other    Renal/GU      Musculoskeletal   Abdominal (+) + obese,   Peds  Hematology   Anesthesia Other Findings   Reproductive/Obstetrics                             Anesthesia Physical Anesthesia Plan  ASA: II  Anesthesia Plan: General   Post-op Pain Management:    Induction: Intravenous  Airway Management Planned: Oral ETT  Additional Equipment:   Intra-op Plan:   Post-operative Plan: Extubation in OR  Informed Consent: I have reviewed the patients History and Physical, chart, labs and discussed the procedure including the risks, benefits and alternatives for the proposed anesthesia with the patient or authorized representative who has indicated his/her understanding and acceptance.   Dental advisory given  Plan Discussed with: CRNA  Anesthesia Plan Comments: (HNP L 4-5 Pseudotumor cerebri H/O Post-op N/V  Plan GA with oral ETT  Kipp Broodavid Nyzier Boivin)        Anesthesia Quick Evaluation

## 2014-01-11 NOTE — Transfer of Care (Signed)
Immediate Anesthesia Transfer of Care Note  Patient: Sandra PomfretChristy T [name]  Procedure(s) Performed: Procedure(s): LUMBAR LAMINECTOMY/DECOMPRESSION MICRODISCECTOMY 1 LEVEL LEFT LUMBAR TWO/THREE (Left)  Patient Location: PACU  Anesthesia Type:General  Level of Consciousness: awake, alert , oriented and patient cooperative  Airway & Oxygen Therapy: Patient Spontanous Breathing and Patient connected to nasal cannula oxygen  Post-op Assessment: Report given to PACU RN, Post -op Vital signs reviewed and stable, Patient moving all extremities and Patient moving all extremities X 4  Post vital signs: Reviewed and stable  Complications: No apparent anesthesia complications

## 2014-01-11 NOTE — Op Note (Signed)
Brief history:the patient is a 38 year old white female who has complained of back and left leg pain consistent with a lumbar radiculopathy. She has failed medical management and was worked up with a lumbar MRI. This demonstrated a herniated disc at L2-3 on the left. I discussed the various treatment options with the patient including surgery. She has weighed the risks, benefits, and alternative surgery and decided proceed with a left L2-3 discectomy.  Preoperative diagnosis:left L2-3 herniated disc, lumbago, lumbar radiculopathy  Postoperative diagnosis:the same  Procedure:left L2-3 Intervertebral discectomy using micro-dissection  Surgeon: Dr. Delma OfficerJeff Esme Freund  Asst.:Dr. Donalee CitrinGary Cram  Anesthesia: Gen. endotracheal  Estimated blood loss:minimal  Drains: None  Complications: None  Description of procedure: The patient was brought to the operating room by the anesthesia team. General endotracheal anesthesia was induced. The patient was turned to the prone position on the Wilson frame. The patient's lumbosacral region was then prepared with Betadine scrub and Betadine solution. Sterile drapes were applied.  I then injected the area to be incised with Marcaine with epinephrine solution. I then used a scalpel to make a linear midline incision over theL2-3intervertebral disc space. I then used electrocautery to perform aleftsided subperiosteal dissection exposing the spinous process and lamina ofL2 and L3. We obtained intraoperative radiograph to confirm our location. I then inserted the [facility] retractor for exposure.  We then brought the operative microscope into the field. Under its magnification and illumination we completed the microdissection. I used a high-speed drill to perform a laminotomy atL2 on the left. I then used a Kerrison punches to widen the laminotomy and removed the ligamentum flavum atL2-3. We then used microdissection to free up the thecal sac and the left L3nerve root from the  epidural tissue. I then used a Kerrison punch to perform a foraminotomy at about the left L3 nerve root. We then using the nerve root retractor to gently retract the thecal sac and the left L3nerve root medially. This exposed the intervertebral disc. We identified the ruptured disc and remove it with the pituitary forceps.I inspected the intervertebral disc. There was a small hole in the annulus. There was no impending herniations. I did not perform intervertebral discectomy.  I then palpated along the ventral surface of the thecal sac and along exit route of theleft L2 and L3 nerve root and noted that the neural structures were well decompressed. This completed the decompression.  We then obtained hemostasis using bipolar electrocautery. We irrigated the wound out with bacitracin solution. We then removed the retractor. We then reapproximated the patient's thoracolumbar fascia with interrupted #1 Vicryl suture. We then reapproximated the patient's subcutaneous tissue with interrupted 2-0 Vicryl suture. We then reapproximated patient's skin with Steri-Strips and benzoin. The was then coated with bacitracin ointment. The drapes were removed. The patient was subsequently returned to the supine position where they were extubated by the anesthesia team. The patient was then transported to the postanesthesia care unit in stable condition. All sponge instrument and needle counts were reportedly correct at the end of this case.

## 2014-01-12 MED ORDER — DIAZEPAM 5 MG PO TABS: 5.0000 mg | ORAL_TABLET | Freq: Four times a day (QID) | ORAL | Status: DC | PRN

## 2014-01-12 MED ORDER — DSS 100 MG PO CAPS: 100.0000 mg | ORAL_CAPSULE | Freq: Two times a day (BID) | ORAL | Status: DC

## 2014-01-12 MED ORDER — HYDROMORPHONE HCL 4 MG PO TABS: 4.0000 mg | ORAL_TABLET | ORAL | Status: DC | PRN

## 2014-01-12 NOTE — Evaluation (Signed)
Physical Therapy Evaluation Patient Details Name: Sandra [name] MRN: [id number] DOB: 1975-04-11 Today's Date: 01/12/2014   History of Present Illness   The patient is a 38 year old white female who has complained of back and left leg pain consistent with a lumbar radiculopathy s/p lumbar laminectomy.    Clinical Impression  Pt currently with functional limitations due to increased pain, decreased strength, decrease mobility and decreased balance. Pt will benefit from skilled PT to increase independence and safety with mobility to allow discharge to home with HHPT with transition to outpatient PT. Pt required moderate cuing during bed mobility and transfers to maintain back precautions. Pt did not want to further practice bed mobility due to fatigue and pain. Pt was educated to contact nurse and practice bed mobility later in the day for safety at discharge. Pt able to ambulate in hall with RW and stated her back felt better when ambulating with the walker compared to without. Pt able to negotiate 1 stair and was educated on options to address stairs so patient could feel comfortable when she goes home.  Pt continues to have pain in LLE with weightbearing.      Follow Up Recommendations Home health PT    Equipment Recommendations  Rolling walker with 5" wheels    Recommendations for Other Services       Precautions / Restrictions Precautions Precautions: Fall;Back Precaution Booklet Issued: Yes (comment) Precaution Comments: Reviewed back recautions at start of session and throughout session.  Restrictions Weight Bearing Restrictions: No      Mobility  Bed Mobility Overal bed mobility: Needs Assistance Bed Mobility: Rolling;Supine to Sit;Sit to Supine Rolling: Min assist   Supine to sit: Min assist Sit to supine: Min assist   General bed mobility comments: Pt required min physical assistance from PT but required moderate cuing for all bed mobility. PT taught patient bed  mobility and reviewed precautions as they apply to bed mobility. Pt had difficulty following precautions despite cuing from PT. Pt would benefit going over bed mobility again. Pt did not want to review bed mobility further due to significant pain. Pt told to contact nurse later to review bed mobility when she feel up to it.   Transfers Overall transfer level: Needs assistance Equipment used: Rolling walker (2 wheeled) Transfers: Sit to/from Stand Sit to Stand: Min assist         General transfer comment: Pt did not require physical assistance from PT but did require cuing to maintain back precautions when moving to stand and min assist for safety.    Ambulation/Gait Ambulation/Gait assistance: Min guard Ambulation Distance (Feet): 200 Feet Assistive device: Rolling walker (2 wheeled) Gait Pattern/deviations: Step-through pattern;Decreased stride length   Gait velocity interpretation: Below normal speed for age/gender General Gait Details: Pt able to ambulate safely in hall with use of RW. Pt stated her back felt better using the walker than it did without it. Pt had no loss of balance but did require 2 short rest breaks.   Stairs Stairs: Yes Stairs assistance: Min guard Stair Management: No rails Number of Stairs: 1 General stair comments: Pt educated on negotiating one stair using RW. Pt educated on going up one stair backwards and pushing up on walker. Pt also educated on having husband help her go up forward if she feels more comfortable that way. Pt stated that she felt comfortable going up one stair to get into home at discharge.   Wheelchair Mobility    Modified Rankin (Stroke Patients Only)  Balance Overall balance assessment: Needs assistance Sitting-balance support: No upper extremity supported;Feet supported Sitting balance-Leahy Scale: Good     Standing balance support: During functional activity;Bilateral upper extremity supported Standing balance-Leahy  Scale: Fair                               Pertinent Vitals/Pain Pain Assessment: 0-10 Pain Score: 9  Pain Location: Low back Pain Descriptors / Indicators: Constant Pain Intervention(s): Limited activity within patient's tolerance;Monitored during session;Premedicated before session;Repositioned    Home Living Family/patient expects to be discharged to:: Private residence Living Arrangements: Spouse/significant other;Children Available Help at Discharge: Family Type of Home: House Home Access: Stairs to enter Entrance Stairs-Rails: None Secretary/administratorntrance Stairs-Number of Steps: 1 Home Layout: One level Home Equipment: None      Prior Function Level of Independence: Independent               Hand Dominance        Extremity/Trunk Assessment               Lower Extremity Assessment: Overall WFL for tasks assessed         Communication   Communication: No difficulties  Cognition Arousal/Alertness: Awake/alert Behavior During Therapy: WFL for tasks assessed/performed Overall Cognitive Status: Within Functional Limits for tasks assessed                      General Comments      Exercises        Assessment/Plan    PT Assessment Patient needs continued PT services  PT Diagnosis Difficulty walking   PT Problem List Decreased strength;Decreased activity tolerance;Decreased balance;Decreased mobility;Decreased coordination;Pain;Decreased knowledge of use of DME  PT Treatment Interventions DME instruction;Gait training;Stair training;Functional mobility training;Therapeutic activities;Therapeutic exercise;Balance training;Patient/family education   PT Goals (Current goals can be found in the Care Plan section) Acute Rehab PT Goals Patient Stated Goal: Start to feel better PT Goal Formulation: With patient Time For Goal Achievement: 01/26/14 Potential to Achieve Goals: Good    Frequency Min 5X/week   Barriers to discharge         Co-evaluation               End of Session Equipment Utilized During Treatment: Gait belt Activity Tolerance: Patient tolerated treatment well;Patient limited by pain Patient left: in bed;with family/visitor present;with call bell/phone within reach Nurse Communication: Mobility status         Time: [id number] PT Time Calculation (min) (ACUTE ONLY): 32 min   Charges:   PT Evaluation $Initial PT Evaluation Tier I: 1 Procedure PT Treatments $Gait Training: 8-22 mins $Self Care/Home Management: 8-22   PT G CodesRenelda [name]:          [name], Sandra [name],Sandra [name] 01/12/2014, 11:16 AM  Sandra [name], Sandra [name]  Acute Rehabilitation [phone]

## 2014-01-12 NOTE — Discharge Summary (Signed)
Physician Discharge Summary  Patient ID: Sandra [name] MRN: [id number] DOB/AGE: 1975/10/04 38 y.o.  Admit date: 01/11/2014 Discharge date: 01/12/2014  Admission Diagnoses: Left L2-3 herniated disc, lumbago, lumbar radiculopathy  Discharge Diagnoses: The same Active Problems:   Lumbar herniated disc   Discharged Condition: good  Hospital Course: I performed a left L2-3 discectomy on the patient on 01/11/2014. The surgery went well.  The patient's postoperative course was unremarkable. On postoperative day #1 she requested discharge home. The patient was given oral and written discharge instructions. All her, and her husbands, questions were answered.  Consults: PT Significant Diagnostic Studies: None Treatments: Left L2-3 discectomy using microdissection. Discharge Exam: Blood pressure 97/49, pulse 82, temperature 98.6 F (37 C), temperature source Oral, resp. rate 18, SpO2 99 %. The patient is alert and pleasant. She looks well. She is in no apparent distress. Her dressing is clean and dry. Her lower extremity strength is normal.  Disposition: Home  Discharge Instructions    Call MD for:  difficulty breathing, headache or visual disturbances    Complete by:  As directed      Call MD for:  extreme fatigue    Complete by:  As directed      Call MD for:  hives    Complete by:  As directed      Call MD for:  persistant dizziness or light-headedness    Complete by:  As directed      Call MD for:  persistant nausea and vomiting    Complete by:  As directed      Call MD for:  redness, tenderness, or signs of infection (pain, swelling, redness, odor or green/yellow discharge around incision site)    Complete by:  As directed      Call MD for:  severe uncontrolled pain    Complete by:  As directed      Call MD for:  temperature >100.4    Complete by:  As directed      Diet - low sodium heart healthy    Complete by:  As directed      Discharge instructions    Complete by:   As directed   Call [phone] for a followup appointment. Take a stool softener while you are using pain medications.     Driving Restrictions    Complete by:  As directed   Do not drive for 2 weeks.     Increase activity slowly    Complete by:  As directed      Lifting restrictions    Complete by:  As directed   Do not lift more than 5 pounds. No excessive bending or twisting.     May shower / Bathe    Complete by:  As directed   He may shower after the pain she is removed 3 days after surgery. Leave the incision alone.     Remove dressing in 48 hours    Complete by:  As directed   Your stitches are under the scan and will dissolve by themselves. The Steri-Strips will fall off after you take a few showers. Do not rub back or pick at the wound, Leave the wound alone.            Medication List    STOP taking these medications        cyclobenzaprine 10 MG tablet  Commonly known as:  FLEXERIL     ketorolac 10 MG tablet  Commonly known as:  TORADOL  methocarbamol 500 MG tablet  Commonly known as:  ROBAXIN     oxyCODONE-acetaminophen 10-325 MG per tablet  Commonly known as:  PERCOCET      TAKE these medications        acetaZOLAMIDE 500 MG capsule  Commonly known as:  DIAMOX  Take 2 capsules (1,000 mg total) by mouth 2 (two) times daily. 1 tab (500mg ) in the morning and 2 tabs (1000mg ) nightly at bedtime for 1 week, then 2 tabs (1000mg ) twice daily thereafter.     acetaZOLAMIDE 500 MG capsule  Commonly known as:  DIAMOX  TAKE ONE CAPSULE BY MOUTH IN THE MORNING AND TWO AT BEDTIME FOR ONE WEEK, THEN TWO CAPSULES TWICE DAILY THEREAFTER     diazepam 5 MG tablet  Commonly known as:  VALIUM  Take 1 tablet (5 mg total) by mouth every 6 (six) hours as needed for muscle spasms.     doxepin 10 MG capsule  Commonly known as:  SINEQUAN  Take 30 mg by mouth at bedtime.     DSS 100 MG Caps  Take 100 mg by mouth 2 (two) times daily.     FLUoxetine 40 MG capsule  Commonly  known as:  PROZAC  Take 40 mg by mouth daily.     HYDROmorphone 4 MG tablet  Commonly known as:  DILAUDID  Take 1 tablet (4 mg total) by mouth every 4 (four) hours as needed for severe pain.     ibuprofen 800 MG tablet  Commonly known as:  ADVIL,MOTRIN  Take 1 tablet (800 mg total) by mouth every 8 (eight) hours as needed.     LINZESS 145 MCG Caps capsule  Generic drug:  Linaclotide  Take 145 mcg by mouth daily as needed (for constipation).     meclizine 25 MG tablet  Commonly known as:  ANTIVERT  Take 25 mg by mouth daily as needed for dizziness.     pantoprazole 40 MG tablet  Commonly known as:  PROTONIX  Take 40 mg by mouth daily as needed (for reflux).     promethazine 25 MG tablet  Commonly known as:  PHENERGAN  Take 1 tablet (25 mg total) by mouth every 8 (eight) hours as needed for nausea or vomiting.     rizatriptan 5 MG disintegrating tablet  Commonly known as:  MAXALT-MLT  Take 5 mg by mouth daily as needed for migraine. May repeat in 2 hours if needed         Signed: Cristi LoronJENKINS,Kyanne Rials D 01/12/2014, 5:36 PM

## 2014-01-12 NOTE — Progress Notes (Signed)
PT. TOLERATING FOOD AND  PO MEDICATIONS, VOIDING WITHOUT DIFFICULTIES, AMBULATING WITH STEADY GAIT WITH WALKER.  PT. AND SPOUSE VERBALIZED UNDERSTANDING OF DISCHARGE ORDERS. NO DISTRESS NOTED.  STAFF AND SPOUSE ASSISTING PT. TO CAR.  CHRIS Radley Barto RN

## 2014-01-13 ENCOUNTER — Encounter (HOSPITAL_COMMUNITY): Payer: Self-pay | Admitting: Neurosurgery

## 2014-02-03 ENCOUNTER — Emergency Department (HOSPITAL_COMMUNITY): Payer: BC Managed Care – PPO

## 2014-02-03 ENCOUNTER — Inpatient Hospital Stay (HOSPITAL_COMMUNITY)
Admission: EM | Admit: 2014-02-03 | Discharge: 2014-02-09 | DRG: 078 | Disposition: A | Discharge status: Home / Self Care | Payer: BC Managed Care – PPO | Attending: Internal Medicine | Admitting: Internal Medicine

## 2014-02-03 ENCOUNTER — Encounter (HOSPITAL_COMMUNITY): Payer: Self-pay | Admitting: *Deleted

## 2014-02-03 DIAGNOSIS — G43709 Chronic migraine without aura, not intractable, without status migrainosus: Secondary | ICD-10-CM | POA: Diagnosis not present

## 2014-02-03 DIAGNOSIS — I10 Essential (primary) hypertension: Secondary | ICD-10-CM | POA: Diagnosis present

## 2014-02-03 DIAGNOSIS — G009 Bacterial meningitis, unspecified: Secondary | ICD-10-CM | POA: Diagnosis not present

## 2014-02-03 DIAGNOSIS — R109 Unspecified abdominal pain: Secondary | ICD-10-CM | POA: Diagnosis present

## 2014-02-03 DIAGNOSIS — F419 Anxiety disorder, unspecified: Secondary | ICD-10-CM | POA: Diagnosis not present

## 2014-02-03 DIAGNOSIS — Z6841 Body Mass Index (BMI) 40.0 and over, adult: Secondary | ICD-10-CM | POA: Diagnosis not present

## 2014-02-03 DIAGNOSIS — F329 Major depressive disorder, single episode, unspecified: Secondary | ICD-10-CM | POA: Diagnosis not present

## 2014-02-03 DIAGNOSIS — F418 Other specified anxiety disorders: Secondary | ICD-10-CM

## 2014-02-03 DIAGNOSIS — M797 Fibromyalgia: Secondary | ICD-10-CM | POA: Diagnosis present

## 2014-02-03 DIAGNOSIS — K219 Gastro-esophageal reflux disease without esophagitis: Secondary | ICD-10-CM | POA: Diagnosis present

## 2014-02-03 DIAGNOSIS — M5126 Other intervertebral disc displacement, lumbar region: Secondary | ICD-10-CM | POA: Diagnosis present

## 2014-02-03 DIAGNOSIS — Z981 Arthrodesis status: Secondary | ICD-10-CM

## 2014-02-03 DIAGNOSIS — Y838 Other surgical procedures as the cause of abnormal reaction of the patient, or of later complication, without mention of misadventure at the time of the procedure: Secondary | ICD-10-CM | POA: Diagnosis not present

## 2014-02-03 DIAGNOSIS — G8918 Other acute postprocedural pain: Secondary | ICD-10-CM

## 2014-02-03 DIAGNOSIS — R51 Headache: Secondary | ICD-10-CM | POA: Diagnosis present

## 2014-02-03 DIAGNOSIS — G971 Other reaction to spinal and lumbar puncture: Secondary | ICD-10-CM | POA: Diagnosis present

## 2014-02-03 DIAGNOSIS — I674 Hypertensive encephalopathy: Principal | ICD-10-CM | POA: Diagnosis present

## 2014-02-03 DIAGNOSIS — G960 Cerebrospinal fluid leak: Secondary | ICD-10-CM | POA: Diagnosis not present

## 2014-02-03 DIAGNOSIS — G932 Benign intracranial hypertension: Secondary | ICD-10-CM

## 2014-02-03 DIAGNOSIS — G8929 Other chronic pain: Secondary | ICD-10-CM | POA: Diagnosis present

## 2014-02-03 DIAGNOSIS — R519 Headache, unspecified: Secondary | ICD-10-CM

## 2014-02-03 LAB — BASIC METABOLIC PANEL
Anion gap: 14 (ref 5–15)
BUN: 12 mg/dL (ref 6–23)
CO2: 19 mEq/L (ref 19–32)
CREATININE: 1.15 mg/dL — AB (ref 0.50–1.10)
Calcium: 8.8 mg/dL (ref 8.4–10.5)
Chloride: 104 mEq/L (ref 96–112)
GFR calc non Af Amer: 59 mL/min — ABNORMAL LOW (ref >90)
GFR, EST AFRICAN AMERICAN: 69 mL/min — AB (ref >90)
Glucose, Bld: 97 mg/dL (ref 70–99)
Potassium: 3.7 mEq/L (ref 3.7–5.3)
Sodium: 137 mEq/L (ref 137–147)

## 2014-02-03 LAB — URINALYSIS, ROUTINE W REFLEX MICROSCOPIC
Bilirubin Urine: NEGATIVE
Glucose, UA: NEGATIVE mg/dL
Hgb urine dipstick: NEGATIVE
Ketones, ur: NEGATIVE mg/dL
Leukocytes, UA: NEGATIVE
Nitrite: NEGATIVE
PROTEIN: NEGATIVE mg/dL
Specific Gravity, Urine: 1.013 (ref 1.005–1.030)
UROBILINOGEN UA: 0.2 mg/dL (ref 0.0–1.0)
pH: 6 (ref 5.0–8.0)

## 2014-02-03 LAB — CBC WITH DIFFERENTIAL/PLATELET
BASOS ABS: 0.1 10*3/uL (ref 0.0–0.1)
BASOS PCT: 1 % (ref 0–1)
EOS ABS: 0.4 10*3/uL (ref 0.0–0.7)
Eosinophils Relative: 5 % (ref 0–5)
HCT: 38.3 % (ref 36.0–46.0)
Hemoglobin: 12.4 g/dL (ref 12.0–15.0)
Lymphocytes Relative: 30 % (ref 12–46)
Lymphs Abs: 2.4 10*3/uL (ref 0.7–4.0)
MCH: 29.4 pg (ref 26.0–34.0)
MCHC: 32.4 g/dL (ref 30.0–36.0)
MCV: 90.8 fL (ref 78.0–100.0)
MONOS PCT: 5 % (ref 3–12)
Monocytes Absolute: 0.4 10*3/uL (ref 0.1–1.0)
Neutro Abs: 4.8 10*3/uL (ref 1.7–7.7)
Neutrophils Relative %: 59 % (ref 43–77)
Platelets: 198 10*3/uL (ref 150–400)
RBC: 4.22 MIL/uL (ref 3.87–5.11)
RDW: 12.7 % (ref 11.5–15.5)
WBC: 8 10*3/uL (ref 4.0–10.5)

## 2014-02-03 LAB — PROCALCITONIN: Procalcitonin: <0.1 ng/mL

## 2014-02-03 LAB — I-STAT CG4 LACTIC ACID, ED: LACTIC ACID, VENOUS: 0.81 mmol/L (ref 0.5–2.2)

## 2014-02-03 MED ORDER — ENOXAPARIN SODIUM 40 MG/0.4ML ~~LOC~~ SOLN
40.0000 mg | Freq: Every day | SUBCUTANEOUS | Status: DC
  Administered 2014-02-04 – 2014-02-05 (×2): 40 mg via SUBCUTANEOUS
  Filled 2014-02-03 (×4): qty 0.4

## 2014-02-03 MED ORDER — DEXTROSE 5 % IV SOLN
1.0000 g | Freq: Once | INTRAVENOUS | Status: AC
  Administered 2014-02-03: 1 g via INTRAVENOUS
  Filled 2014-02-03: qty 10

## 2014-02-03 MED ORDER — SODIUM CHLORIDE 0.9 % IV SOLN
INTRAVENOUS | Status: AC
  Administered 2014-02-03: via INTRAVENOUS

## 2014-02-03 MED ORDER — VANCOMYCIN HCL IN DEXTROSE 1-5 GM/200ML-% IV SOLN
1000.0000 mg | Freq: Two times a day (BID) | INTRAVENOUS | Status: DC
  Administered 2014-02-04 – 2014-02-06 (×6): 1000 mg via INTRAVENOUS
  Filled 2014-02-03 (×6): qty 200

## 2014-02-03 MED ORDER — PANTOPRAZOLE SODIUM 40 MG PO TBEC
40.0000 mg | DELAYED_RELEASE_TABLET | Freq: Every day | ORAL | Status: DC
  Administered 2014-02-04 – 2014-02-09 (×6): 40 mg via ORAL
  Filled 2014-02-03 (×5): qty 1

## 2014-02-03 MED ORDER — HYDROMORPHONE HCL 1 MG/ML IJ SOLN
1.0000 mg | Freq: Once | INTRAMUSCULAR | Status: AC
  Administered 2014-02-03: 1 mg via INTRAVENOUS
  Filled 2014-02-03: qty 1

## 2014-02-03 MED ORDER — ONDANSETRON HCL 4 MG/2ML IJ SOLN
4.0000 mg | Freq: Once | INTRAMUSCULAR | Status: AC
  Administered 2014-02-03: 4 mg via INTRAVENOUS
  Filled 2014-02-03: qty 2

## 2014-02-03 MED ORDER — ACETAZOLAMIDE ER 500 MG PO CP12
1000.0000 mg | ORAL_CAPSULE | Freq: Two times a day (BID) | ORAL | Status: DC
  Administered 2014-02-04 – 2014-02-09 (×11): 1000 mg via ORAL
  Filled 2014-02-03 (×14): qty 2

## 2014-02-03 MED ORDER — VANCOMYCIN HCL IN DEXTROSE 1-5 GM/200ML-% IV SOLN
1000.0000 mg | INTRAVENOUS | Status: AC
  Administered 2014-02-03: 1000 mg via INTRAVENOUS
  Filled 2014-02-03: qty 200

## 2014-02-03 MED ORDER — DIAZEPAM 5 MG PO TABS
5.0000 mg | ORAL_TABLET | Freq: Four times a day (QID) | ORAL | Status: DC | PRN
  Administered 2014-02-04 – 2014-02-08 (×6): 5 mg via ORAL
  Filled 2014-02-03 (×6): qty 1

## 2014-02-03 MED ORDER — HYDROMORPHONE HCL 2 MG PO TABS
4.0000 mg | ORAL_TABLET | ORAL | Status: DC | PRN
  Administered 2014-02-04 – 2014-02-07 (×16): 4 mg via ORAL
  Filled 2014-02-03 (×16): qty 2

## 2014-02-03 MED ORDER — SODIUM CHLORIDE 0.9 % IV BOLUS (SEPSIS)
1000.0000 mL | Freq: Once | INTRAVENOUS | Status: AC
  Administered 2014-02-03: 1000 mL via INTRAVENOUS

## 2014-02-03 MED ORDER — PROMETHAZINE HCL 25 MG PO TABS
25.0000 mg | ORAL_TABLET | Freq: Three times a day (TID) | ORAL | Status: DC | PRN
  Administered 2014-02-04 – 2014-02-06 (×6): 25 mg via ORAL
  Filled 2014-02-03 (×8): qty 1

## 2014-02-03 MED ORDER — VANCOMYCIN HCL IN DEXTROSE 1-5 GM/200ML-% IV SOLN
1000.0000 mg | Freq: Once | INTRAVENOUS | Status: AC
  Administered 2014-02-03: 1000 mg via INTRAVENOUS
  Filled 2014-02-03: qty 200

## 2014-02-03 MED ORDER — DEXTROSE 5 % IV SOLN
2.0000 g | Freq: Three times a day (TID) | INTRAVENOUS | Status: DC
  Administered 2014-02-04 – 2014-02-08 (×14): 2 g via INTRAVENOUS
  Filled 2014-02-03 (×17): qty 2

## 2014-02-03 MED ORDER — FLUOXETINE HCL 20 MG PO CAPS
40.0000 mg | ORAL_CAPSULE | Freq: Every day | ORAL | Status: DC
  Administered 2014-02-04 – 2014-02-06 (×3): 40 mg via ORAL
  Filled 2014-02-03 (×3): qty 2

## 2014-02-03 MED ORDER — GADOBENATE DIMEGLUMINE 529 MG/ML IV SOLN
20.0000 mL | Freq: Once | INTRAVENOUS | Status: AC | PRN
  Administered 2014-02-03: 20 mL via INTRAVENOUS

## 2014-02-03 MED ORDER — DOXEPIN HCL 10 MG PO CAPS
30.0000 mg | ORAL_CAPSULE | Freq: Every day | ORAL | Status: DC
  Administered 2014-02-04 – 2014-02-08 (×4): 30 mg via ORAL
  Filled 2014-02-03 (×7): qty 3

## 2014-02-03 NOTE — ED Notes (Signed)
Dr. Walden at bedside 

## 2014-02-03 NOTE — ED Provider Notes (Signed)
1700 - Care from Dr. Bebe ShaggyWickline. 3F here with headache. Seen by PCP earlier today, was having a fever and a headache. PCP gave her 1 g of Rocephin and toradol and sent her here. Of note, she's about 3 weeks post-op from a lumbar laminectomy by Dr. Lovell SheehanJenkins with Neurosurgery. Here, afebrile, labs ok, looks well, no meningeal signs. Dr. Bebe ShaggyWickline spoke with Dr. Danielle DessElsner who recommended MR of her L-spine. Dr. Danielle DessElsner also advised against LP at this time.   MR shows possible fluid collection. I spoke again to Dr. Danielle DessElsner, he thinks it's likely post-operative fluid, he recommended outpatient keflex for this and f/u with Dr. Lovell SheehanJenkins.   With her being partially treated for meningitis, will admit to see if blood cultures turn positive. Vanc and antoher gram of Rocephin given.   Admitted to IM teaching service.  Elwin MochaBlair Trayson Stitely, MD 02/03/14 [phone]

## 2014-02-03 NOTE — ED Notes (Addendum)
Pt reports recent lumbar surgery on 11/18. Now having severe headache, sensitivity to light, nausea, neck pain, fever x 2 days. Sent here r/o meningitis. Mask on pt at triage.

## 2014-02-03 NOTE — ED Provider Notes (Signed)
CSN: [id number]     Arrival date & time 02/03/14  1341 History   First MD Initiated Contact with Patient 02/03/14 1401     Chief Complaint  Patient presents with  . Back Pain  . Headache   Patient is a 38 y.o. female presenting with back pain and headaches. The history is provided by the patient.  Back Pain Location:  Lumbar spine Quality:  Aching Radiates to:  Does not radiate Pain severity:  Moderate Onset quality:  Gradual Duration:  1 day Timing:  Constant Progression:  Worsening Chronicity:  New Relieved by:  Nothing Worsened by:  Movement Associated symptoms: fever and headaches   Associated symptoms: no bladder incontinence, no bowel incontinence, no dysuria and no weakness   Risk factors: recent surgery   Headache Associated symptoms: back pain and fever   Associated symptoms: no cough and no sore throat   Patient is s/p lumbar laminectomy 01/11/2014 She reports she has done well during post op period She reports starting yesterday she began to have headache and also low back pain She reports HA similar to prior episodes but seems worse (pt has h/o pseudotumor per chart)   She was seen at PCP office today.  She was told she had fever and was given Toradol and also Rocephin and sent to the ER to r/o meningitis  Past Medical History  Diagnosis Date  . Fibromyalgia   . Depression   . Anxiety   . Hypertension   . Migraines   . Chest pain     RHC/LHC in 2007 with normal PCWP and PA pressure, normal coronaries EF 55%  . Dyspnea   . POTS (postural orthostatic tachycardia syndrome)     possible, episodes of sinus tachycardis, holter in 2007 with sinus tachycardia with rates as high as 160  . Ulcer     duodenal in 1993  . GERD (gastroesophageal reflux disease)     with esophageal stricture  . Complication of anesthesia   . PONV (postoperative nausea and vomiting)     usually phenergan helps  . History of hiatal hernia     stomach ulcer ==  had one at age 38  .  Esophageal dilatation     has had them 4-5 times already  . Endometriosis   . Vertigo   . S/P Botox injection     for migraines   Past Surgical History  Procedure Laterality Date  . Cholecystectomy    . Bladder surgery      bladder tact  . Tubal ligation    . Abdominal hysterectomy    . Cardiac catheterization    . Esophageal dilation    . Cystoscopy w/ ureteral stent removal      Dr. Nechama GuardBauer in [place]  . Fluid removal      from brain twice.  [facility] 8-9 mths [facility]  . Lumbar laminectomy/decompression microdiscectomy Left 01/11/2014    Procedure: LUMBAR LAMINECTOMY/DECOMPRESSION MICRODISCECTOMY 1 LEVEL LEFT LUMBAR TWO/THREE;  Surgeon: Tressie StalkerJeffrey Jenkins, MD;  Location: [facility];  Service: Neurosurgery;  Laterality: Left;   Family History  Problem Relation Age of Onset  . Kidney cancer Mother   . Diabetes Mother   . Prostate cancer Maternal Grandfather   . Ulcers Father   . Pancreatic cancer Maternal Grandmother   . Lung cancer Paternal Grandmother     metastatic  . Coronary artery disease Father     pci at age 39  . Aortic aneurysm  History  Substance Use Topics  . Smoking status: Never Smoker   . Smokeless tobacco: Not on file  . Alcohol Use: No   OB History    No data available     Review of Systems  Constitutional: Positive for fever.  HENT: Negative for sore throat.   Respiratory: Negative for cough.   Gastrointestinal: Negative for bowel incontinence.  Genitourinary: Negative for bladder incontinence and dysuria.  Musculoskeletal: Positive for back pain.  Neurological: Positive for headaches. Negative for weakness.  All other systems reviewed and are negative.     Allergies  Penicillins; Topamax; Versed; Ambien; Hydrocodone-acetaminophen; and Zolpidem tartrate  Home Medications   Prior to Admission medications   Medication Sig Start Date End Date Taking? Authorizing Provider  acetaZOLAMIDE (DIAMOX) 500 MG capsule Take 2  capsules (1,000 mg total) by mouth 2 (two) times daily. 1 tab (500mg ) in the morning and 2 tabs (1000mg ) nightly at bedtime for 1 week, then 2 tabs (1000mg ) twice daily thereafter. Patient taking differently: Take 1,000 mg by mouth 2 (two) times daily.  01/27/13  Yes Huston FoleySaima Athar, MD  diazepam (VALIUM) 5 MG tablet Take 1 tablet (5 mg total) by mouth every 6 (six) hours as needed for muscle spasms. 01/12/14  Yes Tressie StalkerJeffrey Jenkins, MD  FLUoxetine (PROZAC) 40 MG capsule Take 40 mg by mouth daily.   Yes Historical Provider, MD  HYDROmorphone (DILAUDID) 4 MG tablet Take 1 tablet (4 mg total) by mouth every 4 (four) hours as needed for severe pain. 01/12/14  Yes Tressie StalkerJeffrey Jenkins, MD  ibuprofen (ADVIL,MOTRIN) 800 MG tablet Take 1 tablet (800 mg total) by mouth every 8 (eight) hours as needed. Patient taking differently: Take 800 mg by mouth every 8 (eight) hours as needed for mild pain.  11/11/12  Yes Ronal FearLynn E Lam, NP  Linaclotide (LINZESS) 145 MCG CAPS capsule Take 145 mcg by mouth daily as needed (for constipation).   Yes Historical Provider, MD  meclizine (ANTIVERT) 25 MG tablet Take 25 mg by mouth daily as needed for dizziness.  05/21/12  Yes Historical Provider, MD  OVER THE COUNTER MEDICATION Take 1 tablet by mouth daily. Stool softener   Yes Historical Provider, MD  pantoprazole (PROTONIX) 40 MG tablet Take 40 mg by mouth daily.  08/13/12  Yes Historical Provider, MD  promethazine (PHENERGAN) 25 MG tablet Take 1 tablet (25 mg total) by mouth every 8 (eight) hours as needed for nausea or vomiting. Patient taking differently: Take 25 mg by mouth every 6 (six) hours as needed for nausea or vomiting.  06/07/13  Yes Huston FoleySaima Athar, MD  acetaZOLAMIDE (DIAMOX) 500 MG capsule TAKE ONE CAPSULE BY MOUTH IN THE MORNING AND TWO AT BEDTIME FOR ONE WEEK, THEN TWO CAPSULES TWICE DAILY THEREAFTER Patient not taking: Reported on 02/03/2014 10/12/13   Huston FoleySaima Athar, MD  docusate sodium 100 MG CAPS Take 100 mg by mouth 2 (two) times  daily. Patient not taking: Reported on 02/03/2014 01/12/14   Tressie StalkerJeffrey Jenkins, MD  doxepin (SINEQUAN) 10 MG capsule Take 30 mg by mouth at bedtime.    Historical Provider, MD  rizatriptan (MAXALT-MLT) 5 MG disintegrating tablet Take 5 mg by mouth daily as needed for migraine. May repeat in 2 hours if needed    Historical Provider, MD   BP 124/71 mmHg  Pulse 103  Temp(Src) 98.3 F (36.8 C) (Oral)  Resp 18  SpO2 95% Physical Exam CONSTITUTIONAL: Well developed/well nourished HEAD: Normocephalic/atraumatic EYES: EOMI/PERRL ENMT: Mucous membranes moist, uvula midline without erythema/exudates NECK:  supple no meningeal signs SPINE/BACK:well healed incision in lumbar spine with focal tenderness noted.  No fluctuance noted CV: S1/S2 noted, no murmurs/rubs/gallops noted LUNGS: Lungs are clear to auscultation bilaterally, no apparent distress ABDOMEN: soft, nontender, no rebound or guarding, bowel sounds noted throughout abdomen GU:no cva tenderness NEURO: Pt is awake/alert/appropriate, moves all extremitiesx4.  No facial droop. She has equal power in both LE with hip flex/extension and knee flex/extension and equal power with ankle plantar/dorsi/flexion EXTREMITIES: pulses normal/equal, full ROM SKIN: warm, color normal PSYCH: no abnormalities of mood noted, alert and oriented to situation  ED Course  Procedures  1455 Spoke to Dr Danielle Dess on call for NSGY He would not recommend any lumbar puncture even under fluoro given h/o recent back surgery He recommends MR lumbar spine with/without contrast He would recommend keflex otherwise for her "possible infection" 5:04 PM Pt stable at this time She is awaiting MR imaging Signed out to Dr Gwendolyn Grant MR imaging pending If negative, will likely need admit until blood cultures are negative and may need broad spectrum abx however she has already been given Rocephin by PCP  Labs Review Labs Reviewed  BASIC METABOLIC PANEL - Abnormal; Notable for the  following:    Creatinine, Ser 1.15 (*)    GFR calc non Af Amer 59 (*)    GFR calc Af Amer 69 (*)    All other components within normal limits  CULTURE, BLOOD (ROUTINE X 2)  CULTURE, BLOOD (ROUTINE X 2)  CBC WITH DIFFERENTIAL  URINALYSIS, ROUTINE W REFLEX MICROSCOPIC  I-STAT CG4 LACTIC ACID, ED    Imaging Review Ct Head Wo Contrast  02/03/2014   CLINICAL DATA:  Severe headache in sensitivity to light. Nausea and neck pain. Fever.  EXAM: CT HEAD WITHOUT CONTRAST  TECHNIQUE: Contiguous axial images were obtained from the base of the skull through the vertex without intravenous contrast.  COMPARISON:  MRI brain 06/23/2012.  FINDINGS: No acute cortical infarct, hemorrhage, or mass lesion is present. The ventricles are of normal size. No significant extra-axial fluid collection is evident. The paranasal sinuses and mastoid air cells are clear. The calvarium is intact.  IMPRESSION: Negative CT of the head   Electronically Signed   By: Gennette Pac M.D.   On: 02/03/2014 15:40    Medications  HYDROmorphone (DILAUDID) injection 1 mg (1 mg Intravenous Given 02/03/14 1437)  ondansetron (ZOFRAN) injection 4 mg (4 mg Intravenous Given 02/03/14 1437)    MDM   Final diagnoses:  Headache  Post-operative pain    Nursing notes including past medical history and social history reviewed and considered in documentation Labs/vital reviewed myself and considered during evaluation Previous records reviewed and considered     Joya Gaskins, MD 02/03/14 1712

## 2014-02-03 NOTE — Progress Notes (Signed)
ANTIBIOTIC CONSULT NOTE - INITIAL  Pharmacy Consult for vancomycin Indication: suspected meningitis  Allergies  Allergen Reactions  . Penicillins Hives and Swelling  . Topamax [Topiramate]     Gives her kidney stones  . Versed [Midazolam] Itching  . Ambien [Zolpidem Tartrate] Rash and Other (See Comments)    Pt states she cannot walk after taking this medication  . Hydrocodone-Acetaminophen Itching    States only Vicodin makes her itch  . Zolpidem Tartrate Itching and Rash    Patient Measurements: weight 114 kg, height 67 inches    Vital Signs: Temp: 98.3 F (36.8 C) (12/11 1346) Temp Source: Oral (12/11 1346) BP: 104/60 mmHg (12/11 2145) Pulse Rate: 76 (12/11 2145) Intake/Output from previous day:   Intake/Output from this shift:    Labs:  Recent Labs  02/03/14 1428  WBC 8.0  HGB 12.4  PLT 198  CREATININE 1.15*   CrCl cannot be calculated (Unknown ideal weight.). No results for input(s): VANCOTROUGH, VANCOPEAK, VANCORANDOM, GENTTROUGH, GENTPEAK, GENTRANDOM, TOBRATROUGH, TOBRAPEAK, TOBRARND, AMIKACINPEAK, AMIKACINTROU, AMIKACIN in the last 72 hours.   Microbiology: Recent Results (from the past 720 hour(s))  Surgical pcr screen     Status: Abnormal   Collection Time: 01/10/14 12:41 PM  Result Value Ref Range Status   MRSA, PCR POSITIVE (A) NEGATIVE Final   Staphylococcus aureus POSITIVE (A) NEGATIVE Final    Comment:        The Xpert SA Assay (FDA approved for NASAL specimens in patients over 39 years of age), is one component of a comprehensive surveillance program.  Test performance has been validated by [facility] for patients greater than or equal to 39 year old. It is not intended to diagnose infection nor to guide or monitor treatment.     Medical History: Past Medical History  Diagnosis Date  . Fibromyalgia   . Depression   . Anxiety   . Hypertension   . Migraines   . Chest pain     RHC/LHC in 2007 with normal PCWP and PA pressure,  normal coronaries EF 55%  . Dyspnea   . POTS (postural orthostatic tachycardia syndrome)     possible, episodes of sinus tachycardis, holter in 2007 with sinus tachycardia with rates as high as 160  . Ulcer     duodenal in 1993  . GERD (gastroesophageal reflux disease)     with esophageal stricture  . Complication of anesthesia   . PONV (postoperative nausea and vomiting)     usually phenergan helps  . History of hiatal hernia     stomach ulcer ==  had one at age 38  . Esophageal dilatation     has had them 4-5 times already  . Endometriosis   . Vertigo   . S/P Botox injection     for migraines    Assessment: Patient is a 38 y.o F s/p lumbar laminectomy/decompression on 11/18.  He presented to the ED today with c/o HA, neck pain, fever, and sensitivity to light.  To start abx for r/o meningitis. Vancomycin 1gm given in the Ed at 2123 and rocephin 1gm given at 2122.  PCN allergy- per RN, no rxn noted with rocephin dose.   Goal of Therapy:  Vancomycin trough level 15-20 mcg/ml  Plan:  1) give an additional vancomycin 1gm IV x1 now to get 2 gm total, then 1gm IV q12h 2) ceftazidime 2mg  IV q8h per MD 3) check vancomycin level at steady state  Monesha Monreal P 02/03/2014,9:57 PM

## 2014-02-03 NOTE — ED Notes (Signed)
Transporting patient to new room assignment. 

## 2014-02-03 NOTE — H&P (Signed)
Date: 02/03/2014               Patient Name:  Sandra [name] MRN: [id number]  DOB: 01-14-1976 Age / Sex: 38 y.o., female   PCP: Glenda Chroman, MD         Medical Service: Internal Medicine Teaching Service         Attending Physician: Dr. Evelina Bucy, MD    First Contact: Dr. Jacques Earthly Pager: [phone]  Second Contact: Dr. Duwaine Maxin Pager: [phone]       After Hours (After 5p/  First Contact Pager: [phone]  weekends / holidays): Second Contact Pager: [phone]   Chief Complaint: Headache and fever  History of Present Illness: Sandra [name] is a 38yo woman w/ PMHx of pseudotumor cerebri, chronic migraines, vertigo, postural orthostatic tachycardia syndrome, congenital lumbar stenosis, and recent back surgery (lumbar laminectomy/decompression) on 01/11/14 who presents to the ED with a 2-3 day history of headache and fever. Patient saw her PCP (Dr. Woody Seller in [place]) today and noted neck pain and photophobia in addition to her headache and fever so her PCP instructed her to come to [facility] ED because of concern for meningitis. She received Rocephin 1 g at her PCP office around 11 AM. She describes the headache as located in the back of her head and neck, throbbing/pounding, and not different from her usual headaches. She states her headache has eased up some since being in the ED. She reports subjective fevers, but does not know what her temperature was at her PCP's office or at home. She also notes worsening back pain, nausea without vomiting, sensitivity to light, and decreased appetite. She denies blurry vision, diplopia, dizziness, tingling/numbness in her extremities, rashes, changes in speech, abdominal pain, and confusion. Of note, she follows with Dr. Rexene Alberts from [facility]. She states she has fluid taken off from her brain for her pseudotumor cerebri and this was last done 2-3 months ago.   In the ED, patient was afebrile. Dr. Ellene Route from neurosurgery was curbsided and recommended  MRI of her lumbar spine and not to perform LP at this time given her recent surgery. She was given Rocephin and Vancomycin in the ED.   Meds: Current Facility-Administered Medications  Medication Dose Route Frequency Provider Last Rate Last Dose  . vancomycin (VANCOCIN) IVPB 1000 mg/200 mL premix  1,000 mg Intravenous Once Evelina Bucy, MD 200 mL/hr at 02/03/14 2123 1,000 mg at 02/03/14 2123   Current Outpatient Prescriptions  Medication Sig Dispense Refill  . acetaZOLAMIDE (DIAMOX) 500 MG capsule Take 2 capsules (1,000 mg total) by mouth 2 (two) times daily. 1 tab (583m) in the morning and 2 tabs (10082m nightly at bedtime for 1 week, then 2 tabs (100018mtwice daily thereafter. (Patient taking differently: Take 1,000 mg by mouth 2 (two) times daily. ) 120 capsule 5  . diazepam (VALIUM) 5 MG tablet Take 1 tablet (5 mg total) by mouth every 6 (six) hours as needed for muscle spasms. 50 tablet 1  . FLUoxetine (PROZAC) 40 MG capsule Take 40 mg by mouth daily.    . HMarland KitchenDROmorphone (DILAUDID) 4 MG tablet Take 1 tablet (4 mg total) by mouth every 4 (four) hours as needed for severe pain. 100 tablet 0  . ibuprofen (ADVIL,MOTRIN) 800 MG tablet Take 1 tablet (800 mg total) by mouth every 8 (eight) hours as needed. (Patient taking differently: Take 800 mg by mouth every 8 (eight) hours as needed for mild pain. ) 90 tablet 3  .  Linaclotide (LINZESS) 145 MCG CAPS capsule Take 145 mcg by mouth daily as needed (for constipation).    . meclizine (ANTIVERT) 25 MG tablet Take 25 mg by mouth daily as needed for dizziness.     Marland Kitchen OVER THE COUNTER MEDICATION Take 1 tablet by mouth daily. Stool softener    . pantoprazole (PROTONIX) 40 MG tablet Take 40 mg by mouth daily.     . promethazine (PHENERGAN) 25 MG tablet Take 1 tablet (25 mg total) by mouth every 8 (eight) hours as needed for nausea or vomiting. (Patient taking differently: Take 25 mg by mouth every 6 (six) hours as needed for nausea or vomiting. ) 30 tablet  3  . acetaZOLAMIDE (DIAMOX) 500 MG capsule TAKE ONE CAPSULE BY MOUTH IN THE MORNING AND TWO AT BEDTIME FOR ONE WEEK, THEN TWO CAPSULES TWICE DAILY THEREAFTER (Patient not taking: Reported on 02/03/2014) 120 capsule 3  . docusate sodium 100 MG CAPS Take 100 mg by mouth 2 (two) times daily. (Patient not taking: Reported on 02/03/2014) 60 capsule 0  . doxepin (SINEQUAN) 10 MG capsule Take 30 mg by mouth at bedtime.    . rizatriptan (MAXALT-MLT) 5 MG disintegrating tablet Take 5 mg by mouth daily as needed for migraine. May repeat in 2 hours if needed      Allergies: Allergies as of 02/03/2014 - Review Complete 02/03/2014  Allergen Reaction Noted  . Penicillins Hives and Swelling 01/05/2014  . Topamax [topiramate]  01/10/2014  . Versed [midazolam] Itching 02/03/2014  . Ambien [zolpidem tartrate] Rash and Other (See Comments) 01/27/2013  . Hydrocodone-acetaminophen Itching   . Zolpidem tartrate Itching and Rash    Past Medical History  Diagnosis Date  . Fibromyalgia   . Depression   . Anxiety   . Hypertension   . Migraines   . Chest pain     RHC/LHC in 2007 with normal PCWP and PA pressure, normal coronaries EF 55%  . Dyspnea   . POTS (postural orthostatic tachycardia syndrome)     possible, episodes of sinus tachycardis, holter in 2007 with sinus tachycardia with rates as high as 160  . Ulcer     duodenal in 1993  . GERD (gastroesophageal reflux disease)     with esophageal stricture  . Complication of anesthesia   . PONV (postoperative nausea and vomiting)     usually phenergan helps  . History of hiatal hernia     stomach ulcer ==  had one at age 43  . Esophageal dilatation     has had them 4-5 times already  . Endometriosis   . Vertigo   . S/P Botox injection     for migraines   Past Surgical History  Procedure Laterality Date  . Cholecystectomy    . Bladder surgery      bladder tact  . Tubal ligation    . Abdominal hysterectomy    . Cardiac catheterization    .  Esophageal dilation    . Cystoscopy w/ ureteral stent removal      Dr. Exie Parody in [place]  . Fluid removal      from brain twice.  last time 8-9 mths ago @ [facility] imaging  . Lumbar laminectomy/decompression microdiscectomy Left 01/11/2014    Procedure: LUMBAR LAMINECTOMY/DECOMPRESSION MICRODISCECTOMY 1 LEVEL LEFT LUMBAR TWO/THREE;  Surgeon: Newman Pies, MD;  Location: [place] [facility];  Service: Neurosurgery;  Laterality: Left;   Family History  Problem Relation Age of Onset  . Kidney cancer Mother   . Diabetes Mother   .  Prostate cancer Maternal Grandfather   . Ulcers Father   . Pancreatic cancer Maternal Grandmother   . Lung cancer Paternal Grandmother     metastatic  . Coronary artery disease Father     pci at age 47  . Aortic aneurysm     History   Social History  . Marital Status: Married    Spouse Name: N/A    Number of Children: N/A  . Years of Education: N/A   Occupational History  . Not on file.   Social History Main Topics  . Smoking status: Never Smoker   . Smokeless tobacco: Not on file  . Alcohol Use: No  . Drug Use: No  . Sexual Activity: Not on file   Other Topics Concern  . Not on file   Social History Narrative    Review of Systems: General: Denies night sweats, changes in weight HEENT: Denies ear pain, rhinorrhea, sore throat CV: Denies CP, palpitations, SOB, orthopnea Pulm: Denies SOB, cough, wheezing GI: Denies diarrhea, constipation, melena, hematochezia GU: Denies dysuria, hematuria, frequency Msk: Denies muscle cramps, joint pains Neuro: See HPI Skin: Denies bruising  Physical Exam: Blood pressure 105/55, pulse 78, temperature 98.3 F (36.8 C), temperature source Oral, resp. rate 14, SpO2 97 %. General: alert, sitting up in bed, NAD HEENT: [place]/AT, EOMI, sclera anicteric, pharynx nonerythematous, mucus membranes dry CV: RRR, normal S1/S2, no m/g/r Pulm: CTA bilaterally, breaths non-labored Abd: BS+, soft, non-tender Ext: warm, no  edema, moves all Neuro: alert and oriented x 3, CNs II-XII intact, poor effort given for strength assessment in all extremities, Kernig and Brudzinski's signs negative, gait normal Skin: No rashes present. Small surgical incision on lower back appears clean, dry, intact, no evidence of infection.   Lab results: Basic Metabolic Panel:  Recent Labs  02/03/14 1428  NA 137  K 3.7  CL 104  CO2 19  GLUCOSE 97  BUN 12  CREATININE 1.15*  CALCIUM 8.8   Liver Function Tests: No results for input(s): AST, ALT, ALKPHOS, BILITOT, PROT, ALBUMIN in the last 72 hours. No results for input(s): LIPASE, AMYLASE in the last 72 hours. No results for input(s): AMMONIA in the last 72 hours. CBC:  Recent Labs  02/03/14 1428  WBC 8.0  NEUTROABS 4.8  HGB 12.4  HCT 38.3  MCV 90.8  PLT 198   Cardiac Enzymes: No results for input(s): CKTOTAL, CKMB, CKMBINDEX, TROPONINI in the last 72 hours. BNP: No results for input(s): PROBNP in the last 72 hours. D-Dimer: No results for input(s): DDIMER in the last 72 hours. CBG: No results for input(s): GLUCAP in the last 72 hours. Hemoglobin A1C: No results for input(s): HGBA1C in the last 72 hours. Fasting Lipid Panel: No results for input(s): CHOL, HDL, LDLCALC, TRIG, CHOLHDL, LDLDIRECT in the last 72 hours. Thyroid Function Tests: No results for input(s): TSH, T4TOTAL, FREET4, T3FREE, THYROIDAB in the last 72 hours. Anemia Panel: No results for input(s): VITAMINB12, FOLATE, FERRITIN, TIBC, IRON, RETICCTPCT in the last 72 hours. Coagulation: No results for input(s): LABPROT, INR in the last 72 hours. Urine Drug Screen: Drugs of Abuse  No results found for: LABOPIA, COCAINSCRNUR, LABBENZ, AMPHETMU, THCU, LABBARB  Alcohol Level: No results for input(s): ETH in the last 72 hours. Urinalysis:  Recent Labs  02/03/14 1455  COLORURINE YELLOW  LABSPEC 1.013  PHURINE 6.0  GLUCOSEU NEGATIVE  HGBUR NEGATIVE  BILIRUBINUR NEGATIVE  KETONESUR NEGATIVE   PROTEINUR NEGATIVE  UROBILINOGEN 0.2  NITRITE NEGATIVE  LEUKOCYTESUR NEGATIVE  Imaging results:  Ct Head Wo Contrast  02/03/2014   CLINICAL DATA:  Severe headache in sensitivity to light. Nausea and neck pain. Fever.  EXAM: CT HEAD WITHOUT CONTRAST  TECHNIQUE: Contiguous axial images were obtained from the base of the skull through the vertex without intravenous contrast.  COMPARISON:  MRI brain 06/23/2012.  FINDINGS: No acute cortical infarct, hemorrhage, or mass lesion is present. The ventricles are of normal size. No significant extra-axial fluid collection is evident. The paranasal sinuses and mastoid air cells are clear. The calvarium is intact.  IMPRESSION: Negative CT of the head   Electronically Signed   By: Lawrence Santiago M.D.   On: 02/03/2014 15:40   Mr Lumbar Spine W Wo Contrast  02/03/2014   CLINICAL DATA:  Back surgery 01/11/2014. Now with severe headache with light sensitivity. Neck pain and fever. Rule out infection.  EXAM: MRI LUMBAR SPINE WITHOUT AND WITH CONTRAST  TECHNIQUE: Multiplanar and multiecho pulse sequences of the lumbar spine were obtained without and with intravenous contrast.  CONTRAST:  39m MULTIHANCE GADOBENATE DIMEGLUMINE 529 MG/ML IV SOLN  COMPARISON:  Lumbar MRI 11/19/2013  FINDINGS: Patient has congenital lumbar stenosis with a small canal throughout the lumbar spine. Conus medullaris is normal and terminates at L1. No evidence of discitis or osteomyelitis.  L1-2:  Negative  L2-3: Postop laminectomy on the left. There has been discectomy and removal of the disc protrusion on the left. There remains mild left-sided disc bulging and enhancement on the left side of the thecal sac related to recent surgery. There is a small fluid collection in the laminectomy defect. This small fluid collection extends posteriorly to the subcutaneous tissues and may be a small CSF leak. Infection not excluded.  L3-4:  Mild congenital stenosis.  Mild facet hypertrophy bilaterally   L4-5: Disc bulging and facet hypertrophy with mild to moderate spinal stenosis which is unchanged.  L5-S1:  Mild spinal stenosis unchanged.  IMPRESSION: Postop laminectomy and discectomy on the left at L2-3. No recurrent disc protrusion or stenosis. Small fluid collection extends from the laminectomy defect posteriorly to the subcutaneous tissues and may represent postop fluid however CSF leak or infection not excluded.  Congenital lumbar stenosis.   Electronically Signed   By: CFranchot GalloM.D.   On: 02/03/2014 18:52     Assessment & Plan by Problem: Principal Problem:   Possible Bacterial meningitis Active Problems:   Morbid obesity   Headache   Lumbar herniated disc   Pseudotumor cerebri Ms. GGordy Levanis a 89yowoman w/ PMHx of pseudotumor cerebri, chronic migraines, vertigo, postural orthostatic tachycardia syndrome, congenital lumbar stenosis, and recent back surgery (lumbar laminectomy/decompression) on 01/11/14 who presents to the ED with a 2-3 day history of headache and fever concerning for possible bacterial meningitis.  Possible Bacterial Meningitis: Patient presents with 2-3 day history of subjective fever, headache, neck pain, and photophobia in setting of recent neurosurgery (lumbar laminectomy/decompression) concerning for possible meningitis. Patient received Rocephin 1 g at her PCP's office at 11 AM and then another dose of Rocephin 1 g in the ED plus Vancomycin 1 g. She is currently afebrile and headache improving. She is alert and oriented x 3. Neurological exam is non-focal and Kernig and Brudzinski's signs negative. WBC count normal at 8.0. Lactic acid normal. CT head negative. MRI lumbar spine shows a small fluid collection that extends from the laminectomy defect to the subcutaneous tissues that could represent postop fluid, but CSF leak or infection cannot be excluded. This presentation makes  meningitis difficult to exclude given her symptoms and findings on MRI, and she had  already received antibiotics >6 hours ago upon presentation to the ED. Neurosurgery recommended to not perform lumbar puncture with her recent surgery, which also complicates diagnosis. Patient also has history of chronic migraines and vertigo. Possible that her headache is related to her chronic migraines since she stated her headache is not much different than her usual ones. However, in setting of fever this is concerning for infection. Will treat as a nosocomial bacterial meningitis resulting from postneurosurgical infection. Common pathogens for this type of infection include facultative and aerobic gram negative bacilli (including Pseudomonas aeruginosa), Staph aureus, and coagulase-negative staphylococci (S. epidermidis).  - Neurosurgery consulted, appreciate recommendations - Start Ceftazidime 2 g IV Q8H - Continue Vancomycin 1 g IV Q12H - ESR, CRP - Procalcitonin level - Repeat CBC and CMP  Pseudotumor Cerebri: Patient follows with Dr. Rexene Alberts at [facility]. She takes Diamox 1000 mg BID at home. She receives serial lumbar punctures as well with last one 2-3 months ago per patient. However, per records last documented LP on 02/04/13.  - Continue home Diamox  Depression/Anxiety: Patient takes Prozac 40 mg daily and Doxepin 30 mg QHS at home. - Continue home meds  Diet: Heart healthy DVT/PE PPx: Lovenox SQ Dispo: Disposition is deferred at this time, awaiting improvement of current medical problems. Anticipated discharge in approximately 2-3 day(s).   The patient does have a current PCP (Glenda Chroman, MD) and does need an [facility] hospital follow-up appointment after discharge.  The patient does not have transportation limitations that hinder transportation to clinic appointments.  Signed: Albin Felling, MD 02/03/2014, 9:33 PM

## 2014-02-04 DIAGNOSIS — M5126 Other intervertebral disc displacement, lumbar region: Secondary | ICD-10-CM

## 2014-02-04 DIAGNOSIS — F329 Major depressive disorder, single episode, unspecified: Secondary | ICD-10-CM

## 2014-02-04 DIAGNOSIS — F419 Anxiety disorder, unspecified: Secondary | ICD-10-CM

## 2014-02-04 LAB — SEDIMENTATION RATE: Sed Rate: 33 mm/hr — ABNORMAL HIGH (ref 0–22)

## 2014-02-04 LAB — CBC WITH DIFFERENTIAL/PLATELET
BASOS PCT: 0 % (ref 0–1)
Basophils Absolute: 0 10*3/uL (ref 0.0–0.1)
EOS ABS: 0.2 10*3/uL (ref 0.0–0.7)
Eosinophils Relative: 5 % (ref 0–5)
HCT: 34.5 % — ABNORMAL LOW (ref 36.0–46.0)
Hemoglobin: 11.1 g/dL — ABNORMAL LOW (ref 12.0–15.0)
Lymphocytes Relative: 31 % (ref 12–46)
Lymphs Abs: 1.4 10*3/uL (ref 0.7–4.0)
MCH: 30.3 pg (ref 26.0–34.0)
MCHC: 32.2 g/dL (ref 30.0–36.0)
MCV: 94.3 fL (ref 78.0–100.0)
Monocytes Absolute: 0.2 10*3/uL (ref 0.1–1.0)
Monocytes Relative: 5 % (ref 3–12)
NEUTROS ABS: 2.6 10*3/uL (ref 1.7–7.7)
NEUTROS PCT: 59 % (ref 43–77)
Platelets: 154 10*3/uL (ref 150–400)
RBC: 3.66 MIL/uL — AB (ref 3.87–5.11)
RDW: 12.7 % (ref 11.5–15.5)
WBC: 4.4 10*3/uL (ref 4.0–10.5)

## 2014-02-04 LAB — COMPREHENSIVE METABOLIC PANEL
ALK PHOS: 101 U/L (ref 39–117)
ALT: 15 U/L (ref 0–35)
AST: 17 U/L (ref 0–37)
Albumin: 2.7 g/dL — ABNORMAL LOW (ref 3.5–5.2)
Anion gap: 10 (ref 5–15)
BUN: 13 mg/dL (ref 6–23)
CALCIUM: 8.5 mg/dL (ref 8.4–10.5)
CO2: 23 mEq/L (ref 19–32)
Chloride: 108 mEq/L (ref 96–112)
Creatinine, Ser: 1.05 mg/dL (ref 0.50–1.10)
GFR, EST AFRICAN AMERICAN: 76 mL/min — AB (ref >90)
GFR, EST NON AFRICAN AMERICAN: 66 mL/min — AB (ref >90)
GLUCOSE: 85 mg/dL (ref 70–99)
Potassium: 3.8 mEq/L (ref 3.7–5.3)
Sodium: 141 mEq/L (ref 137–147)
TOTAL PROTEIN: 5.9 g/dL — AB (ref 6.0–8.3)
Total Bilirubin: <0.2 mg/dL — ABNORMAL LOW (ref 0.3–1.2)

## 2014-02-04 LAB — MRSA PCR SCREENING: MRSA BY PCR: NEGATIVE

## 2014-02-04 LAB — C-REACTIVE PROTEIN: CRP: <0.5 mg/dL — ABNORMAL LOW (ref <0.60)

## 2014-02-04 NOTE — Evaluation (Signed)
Physical Therapy Evaluation Patient Details Name: Sandra [name] MRN: [id number] DOB: 06-Jan-1976 Today's Date: 02/04/2014   History of Present Illness  : [name] is a 38yo woman w/ PMHx of pseudotumor cerebri, chronic migraines, vertigo, postural orthostatic tachycardia syndrome, congenital lumbar stenosis, and recent back surgery (lumbar laminectomy/decompression) on 01/11/14 who presents to the ED with a 2-3 day history of headache and fever.  Clinical Impression  Pt admitted with/for HA and fever.  Work up continues.  Pt currently limited functionally due to the problems listed below.  (see problems list.)  Pt will benefit from PT to maximize function and safety to be able to get home safely with available assist of family.     Follow Up Recommendations No PT follow up;Supervision/Assistance - 24 hour    Equipment Recommendations  None recommended by PT    Recommendations for Other Services       Precautions / Restrictions Precautions Precautions: Fall Restrictions Weight Bearing Restrictions: No      Mobility  Bed Mobility Overal bed mobility: Needs Assistance Bed Mobility: Sidelying to Sit;Sit to Sidelying   Sidelying to sit: Supervision     Sit to sidelying: Supervision General bed mobility comments: Reinforced log roll technique and getting in/out of bed via sidelying.  Transfers Overall transfer level: Needs assistance   Transfers: Sit to/from Stand Sit to Stand: Supervision         General transfer comment: good technique  Ambulation/Gait Ambulation/Gait assistance: Min guard Ambulation Distance (Feet): 160 Feet Assistive device: None Gait Pattern/deviations: Step-through pattern Gait velocity: moderate   General Gait Details: generally steady though guarded due to HA and eyes staying shut on occasion  Stairs            Wheelchair Mobility    Modified Rankin (Stroke Patients Only)       Balance Overall balance assessment: Needs  assistance Sitting-balance support: Feet supported;No upper extremity supported Sitting balance-Leahy Scale: Good     Standing balance support: No upper extremity supported Standing balance-Leahy Scale: Fair                               Pertinent Vitals/Pain Pain Assessment: 0-10 Pain Score: 10-Worst pain ever Pain Location: Headache Pain Descriptors / Indicators: Constant;Crushing Pain Intervention(s): Monitored during session    Home Living Family/patient expects to be discharged to:: Private residence Living Arrangements: Spouse/significant other Available Help at Discharge: Family Type of Home: House Home Access: Stairs to enter Entrance Stairs-Rails: None Secretary/administratorntrance Stairs-Number of Steps: 1 Home Layout: One level Home Equipment: None      Prior Function Level of Independence: Independent               Hand Dominance        Extremity/Trunk Assessment   Upper Extremity Assessment: Overall WFL for tasks assessed           Lower Extremity Assessment: Overall WFL for tasks assessed (generally feeling weak and not well)         Communication   Communication: No difficulties  Cognition Arousal/Alertness: Awake/alert Behavior During Therapy: WFL for tasks assessed/performed Overall Cognitive Status: Within Functional Limits for tasks assessed                      General Comments General comments (skin integrity, edema, etc.): Reinforced back prec, logroll, lifting restrictions; emphasis on getting OOB via sidelying.    Exercises  Assessment/Plan    PT Assessment Patient needs continued PT services  PT Diagnosis Acute pain   PT Problem List Decreased strength;Decreased activity tolerance;Decreased mobility;Decreased knowledge of precautions;Pain  PT Treatment Interventions DME instruction;Gait training;Functional mobility training;Therapeutic activities;Patient/family education   PT Goals (Current goals can be found  in the Care Plan section) Acute Rehab PT Goals Patient Stated Goal: not stated, but she and husband want to figure out what's happening PT Goal Formulation: With patient Time For Goal Achievement: 02/18/14 Potential to Achieve Goals: Good    Frequency Min 3X/week   Barriers to discharge        Co-evaluation               End of Session   Activity Tolerance: Patient tolerated treatment well;Patient limited by pain Patient left: in bed;with call bell/phone within reach Nurse Communication: Mobility status         Time: [id number] PT Time Calculation (min) (ACUTE ONLY): 20 min   Charges:   PT Evaluation $Initial PT Evaluation Tier I: 1 Procedure PT Treatments $Gait Training: 8-22 mins   PT G Codes:          Sandra [name], Sandra [name] 02/04/2014, 3:10 PM 02/04/2014  Sandra [name], PT [phone]  (pager)

## 2014-02-04 NOTE — Progress Notes (Signed)
Pt arrive to unit in no s/s of distress. Pt A&Ox4. Pt oriented to room and unit. Husband at bedside. VS stable. Whiteboard updated. Callbell within reach. Report received from ED nurse prior to pt's arrival. Will continue to monitor.

## 2014-02-04 NOTE — Progress Notes (Signed)
Subjective: Sandra [name]. She reports her head hurts this morning. Her back also hurts. Denies new weakness/paresthesias. Denies SOB, chest pain. She has nausea but no vomiting. Denies abdominal pain.  Objective: Vital signs in last 24 hours: Filed Vitals:   02/03/14 2306 02/03/14 2345 02/04/14 0013 02/04/14 0500  BP: 102/66  124/57 100/58  Pulse: 65  75 65  Temp:   97.6 F (36.4 C) 97.8 F (36.6 C)  TempSrc:   Oral Oral  Resp:   18 18  Height:  '5\' 6"'  (1.676 m)    Weight:  269 lb 14.4 oz (122.426 kg)    SpO2: 96%  99% 98%   Weight change:   Intake/Output Summary (Last 24 hours) at 02/04/14 1449 Last data filed at 02/04/14 1008  Gross per 24 hour  Intake 1471.25 ml  Output      0 ml  Net 1471.25 ml   General: alert, sitting up in bed, NAD HEENT: [place]/AT, EOMI, sclera anicteric, pharynx nonerythematous, mucus membranes dry CV: RRR, normal S1/S2, no m/g/r Pulm: CTA bilaterally, breaths non-labored Abd: BS+, soft, non-tender Ext: warm, no edema, moves all Neuro: alert and oriented x 3, CNs II-XII intact, 5/5 strength B UE and LE. Sensation intact bilaterally.  Skin: No rashes present. Small surgical incision on lower back appears clean, dry, intact, no erythema or drainage. Appropriately tender to palpation.  Lab Results: Basic Metabolic Panel:  Recent Labs Lab 02/03/14 1428 02/04/14 0447  NA 137 141  K 3.7 3.8  CL 104 108  CO2 19 23  GLUCOSE 97 85  BUN 12 13  CREATININE 1.15* 1.05  CALCIUM 8.8 8.5   Liver Function Tests:  Recent Labs Lab 02/04/14 0447  AST 17  ALT 15  ALKPHOS 101  BILITOT <0.2*  PROT 5.9*  ALBUMIN 2.7*   CBC:  Recent Labs Lab 02/03/14 1428 02/04/14 0447  WBC 8.0 4.4  NEUTROABS 4.8 2.6  HGB 12.4 11.1*  HCT 38.3 34.5*  MCV 90.8 94.3  PLT 198 154   Urinalysis:  Recent Labs Lab 02/03/14 1455  [place]  LABSPEC 1.013  PHURINE 6.0  GLUCOSEU NEGATIVE  HGBUR NEGATIVE  BILIRUBINUR NEGATIVE  KETONESUR NEGATIVE  PROTEINUR  NEGATIVE  UROBILINOGEN 0.2  NITRITE NEGATIVE  LEUKOCYTESUR NEGATIVE    Micro Results: Recent Results (from the past 240 hour(s))  Culture, blood (routine x 2)     Status: None (Preliminary result)   Collection Time: 02/03/14  2:40 PM  Result Value Ref Range Status   Specimen Description BLOOD ARM LEFT  Final   Special Requests BOTTLES DRAWN AEROBIC AND ANAEROBIC 5CC  Final   Culture  Setup Time   Final    02/03/2014 20:36 Performed at [facility]    Culture   Final           BLOOD CULTURE RECEIVED NO GROWTH TO DATE CULTURE WILL BE HELD FOR 5 DAYS BEFORE ISSUING A FINAL NEGATIVE REPORT Performed at [facility]    Report Status PENDING  Incomplete  Culture, blood (routine x 2)     Status: None (Preliminary result)   Collection Time: 02/03/14  2:45 PM  Result Value Ref Range Status   Specimen Description BLOOD HAND RIGHT  Final   Special Requests BOTTLES DRAWN AEROBIC AND ANAEROBIC 5CC  Final   Culture  Setup Time   Final    02/03/2014 20:39 Performed at [place]   Final  BLOOD CULTURE RECEIVED NO GROWTH TO DATE CULTURE WILL BE HELD FOR 5 DAYS BEFORE ISSUING A FINAL NEGATIVE REPORT Performed at [facility]    Report Status PENDING  Incomplete  MRSA PCR Screening     Status: None   Collection Time: 02/03/14 11:58 PM  Result Value Ref Range Status   MRSA by PCR NEGATIVE NEGATIVE Final    Comment:        The GeneXpert MRSA Assay (FDA approved for NASAL specimens only), is one component of a comprehensive MRSA colonization surveillance program. It is not intended to diagnose MRSA infection nor to guide or monitor treatment for MRSA infections.    Studies/Results: Ct Head Wo Contrast  02/03/2014   CLINICAL DATA:  Severe headache in sensitivity to light. Nausea and neck pain. Fever.  EXAM: CT HEAD WITHOUT CONTRAST  TECHNIQUE: Contiguous axial images were obtained from the base of the skull through the vertex  without intravenous contrast.  COMPARISON:  MRI brain 06/23/2012.  FINDINGS: No acute cortical infarct, hemorrhage, or mass lesion is present. The ventricles are of normal size. No significant extra-axial fluid collection is evident. The paranasal sinuses and mastoid air cells are clear. The calvarium is intact.  IMPRESSION: Negative CT of the head   Electronically Signed   By: Lawrence Santiago M.D.   On: 02/03/2014 15:40   Mr Lumbar Spine W Wo Contrast  02/03/2014   CLINICAL DATA:  Back surgery 01/11/2014. Now with severe headache with light sensitivity. Neck pain and fever. Rule out infection.  EXAM: MRI LUMBAR SPINE WITHOUT AND WITH CONTRAST  TECHNIQUE: Multiplanar and multiecho pulse sequences of the lumbar spine were obtained without and with intravenous contrast.  CONTRAST:  1m MULTIHANCE GADOBENATE DIMEGLUMINE 529 MG/ML IV SOLN  COMPARISON:  Lumbar MRI 11/19/2013  FINDINGS: Patient has congenital lumbar stenosis with a small canal throughout the lumbar spine. Conus medullaris is normal and terminates at L1. No evidence of discitis or osteomyelitis.  L1-2:  Negative  L2-3: Postop laminectomy on the left. There has been discectomy and removal of the disc protrusion on the left. There remains mild left-sided disc bulging and enhancement on the left side of the thecal sac related to recent surgery. There is a small fluid collection in the laminectomy defect. This small fluid collection extends posteriorly to the subcutaneous tissues and may be a small CSF leak. Infection not excluded.  L3-4:  Mild congenital stenosis.  Mild facet hypertrophy bilaterally  L4-5: Disc bulging and facet hypertrophy with mild to moderate spinal stenosis which is unchanged.  L5-S1:  Mild spinal stenosis unchanged.  IMPRESSION: Postop laminectomy and discectomy on the left at L2-3. No recurrent disc protrusion or stenosis. Small fluid collection extends from the laminectomy defect posteriorly to the subcutaneous tissues and may  represent postop fluid however CSF leak or infection not excluded.  Congenital lumbar stenosis.   Electronically Signed   By: CFranchot GalloM.D.   On: 02/03/2014 18:52   Medications: I have reviewed the patient's current medications. Scheduled Meds: . acetaZOLAMIDE  1,000 mg Oral BID  . cefTAZidime (FORTAZ)  IV  2 g Intravenous 3 times per day  . doxepin  30 mg Oral QHS  . enoxaparin (LOVENOX) injection  40 mg Subcutaneous Daily  . FLUoxetine  40 mg Oral Daily  . pantoprazole  40 mg Oral Daily  . vancomycin  1,000 mg Intravenous Q12H   Continuous Infusions:  PRN Meds:.diazepam, HYDROmorphone, promethazine Assessment/Plan: Principal Problem:   Possible Bacterial meningitis Active  Problems:   Morbid obesity   Headache   Lumbar herniated disc   Pseudotumor cerebri  Possible Bacterial Meningitis: ESR 33. Procalcitonin <0.1 - Neurosurgery consulted, appreciate recommendations - Continue Ceftazidime 2 g IV Q8H - Continue Vancomycin 1 g IV Q12H - CRP pending  Pseudotumor Cerebri: Patient follows with Dr. Rexene Alberts at [facility]. She takes Diamox 1000 mg BID at home. She receives serial lumbar punctures as well with last one 2-3 months ago per patient. However, per records last documented LP on 02/04/13.  - Continue home Diamox - Will consider consulting neurology if headache does not improve  Depression/Anxiety: Patient takes Prozac 40 mg daily and Doxepin 30 mg QHS at home. - Continue home meds  Diet: Heart healthy DVT/PE PPx: Lovenox SQ  Dispo: Disposition is deferred at this time, awaiting improvement of current medical problems.  Anticipated discharge in approximately 1-2 day(s).   The patient does have a current PCP (Glenda Chroman, MD) and does not need an [facility] hospital follow-up appointment after discharge.  The patient does not have transportation limitations that hinder transportation to clinic appointments.  .Services Needed at time of discharge: Y = Yes, Blank =  No PT:   OT:   RN:   Equipment:   Other:     LOS: 1 day   Jacques Earthly, MD 02/04/2014, 2:49 PM

## 2014-02-05 LAB — PROCALCITONIN: Procalcitonin: <0.1 ng/mL

## 2014-02-05 LAB — CBC
HEMATOCRIT: 33.1 % — AB (ref 36.0–46.0)
Hemoglobin: 10.5 g/dL — ABNORMAL LOW (ref 12.0–15.0)
MCH: 29.2 pg (ref 26.0–34.0)
MCHC: 31.7 g/dL (ref 30.0–36.0)
MCV: 92.2 fL (ref 78.0–100.0)
Platelets: 159 10*3/uL (ref 150–400)
RBC: 3.59 MIL/uL — ABNORMAL LOW (ref 3.87–5.11)
RDW: 12.5 % (ref 11.5–15.5)
WBC: 4.8 10*3/uL (ref 4.0–10.5)

## 2014-02-05 LAB — BASIC METABOLIC PANEL
Anion gap: 12 (ref 5–15)
BUN: 10 mg/dL (ref 6–23)
CALCIUM: 8.5 mg/dL (ref 8.4–10.5)
CO2: 18 mEq/L — ABNORMAL LOW (ref 19–32)
CREATININE: 0.99 mg/dL (ref 0.50–1.10)
Chloride: 107 mEq/L (ref 96–112)
GFR, EST AFRICAN AMERICAN: 82 mL/min — AB (ref >90)
GFR, EST NON AFRICAN AMERICAN: 71 mL/min — AB (ref >90)
Glucose, Bld: 96 mg/dL (ref 70–99)
Potassium: 3.8 mEq/L (ref 3.7–5.3)
Sodium: 137 mEq/L (ref 137–147)

## 2014-02-05 NOTE — Progress Notes (Addendum)
Subjective: Patient seen and examined this AM.  She reports worsening headache overnight, small improvement this AM.  Headache is still posterior and frontal.  Also with B/L thigh/leg pain.  She denies saddle/leg anesthesia or loss of bowel or bladder control.  She is able to ambulate to the bathroom.  Objective: Vital signs in last 24 hours: Filed Vitals:   02/04/14 0500 02/04/14 1457 02/04/14 2108 02/05/14 0557  BP: 100/58 121/58 118/48 105/52  Pulse: 65 98  82  Temp: 97.8 F (36.6 C) 99.9 F (37.7 C) 100.1 F (37.8 C) 98.9 F (37.2 C)  TempSrc: Oral Oral Oral Oral  Resp: _0 Height:      Weight:      SpO2: 98% 100%  96%   Weight change:   Intake/Output Summary (Last 24 hours) at 02/05/14 0749 Last data filed at 02/05/14 0558  Gross per 24 hour  Intake   1065 ml  Output    400 ml  Net    665 ml   General: alert in NAD HEENT: [place]/AT, EOMI CV: RRR, normal S1/S2, no m/g/r Pulm: CTA bilaterally, breaths non-labored Abd: BS+, soft, non-tender Ext: warm, no edema, moves all Neuro: alert and oriented x 3, CNs III-XII intact, 5/5 strength B/L UE and LE. Sensation intact bilaterally.  Skin: No rashes present. Small surgical incision on lower back appears clean, dry, intact, no erythema or drainage. Appropriately tender to palpation.  Lab Results: Basic Metabolic Panel:  Recent Labs Lab 02/04/14 0447 02/05/14 0514  NA 141 137  K 3.8 3.8  CL 108 107  CO2 23 18*  GLUCOSE 85 96  BUN 13 10  CREATININE 1.05 0.99  CALCIUM 8.5 8.5   Liver Function Tests:  Recent Labs Lab 02/04/14 0447  AST 17  ALT 15  ALKPHOS 101  BILITOT <0.2*  PROT 5.9*  ALBUMIN 2.7*   CBC:  Recent Labs Lab 02/03/14 1428 02/04/14 0447 02/05/14 0514  WBC 8.0 4.4 4.8  NEUTROABS 4.8 2.6  --   HGB 12.4 11.1* 10.5*  HCT 38.3 34.5* 33.1*  MCV 90.8 94.3 92.2  PLT 198 154 159   Urinalysis:  Recent Labs Lab 02/03/14 1455  COLORURINE YELLOW  LABSPEC 1.013  PHURINE 6.0    GLUCOSEU NEGATIVE  HGBUR NEGATIVE  BILIRUBINUR NEGATIVE  KETONESUR NEGATIVE  PROTEINUR NEGATIVE  UROBILINOGEN 0.2  NITRITE NEGATIVE  LEUKOCYTESUR NEGATIVE    Micro Results: Recent Results (from the past 240 hour(s))  Culture, blood (routine x 2)     Status: None (Preliminary result)   Collection Time: 02/03/14  2:40 PM  Result Value Ref Range Status   Specimen Description BLOOD ARM LEFT  Final   Special Requests BOTTLES DRAWN AEROBIC AND ANAEROBIC 5CC  Final   Culture  Setup Time   Final    02/03/2014 20:36 Performed at [facility]    Culture   Final           BLOOD CULTURE RECEIVED NO GROWTH TO DATE CULTURE WILL BE HELD FOR 5 DAYS BEFORE ISSUING A FINAL NEGATIVE REPORT Performed at [facility]    Report Status PENDING  Incomplete  Culture, blood (routine x 2)     Status: None (Preliminary result)   Collection Time: 02/03/14  2:45 PM  Result Value Ref Range Status   Specimen Description BLOOD HAND RIGHT  Final   Special Requests BOTTLES DRAWN AEROBIC AND ANAEROBIC 5CC  Final   Culture  Setup Time   Final  02/03/2014 20:39 Performed at [facility]    Culture   Final           BLOOD CULTURE RECEIVED NO GROWTH TO DATE CULTURE WILL BE HELD FOR 5 DAYS BEFORE ISSUING A FINAL NEGATIVE REPORT Performed at [facility]    Report Status PENDING  Incomplete  MRSA PCR Screening     Status: None   Collection Time: 02/03/14 11:58 PM  Result Value Ref Range Status   MRSA by PCR NEGATIVE NEGATIVE Final    Comment:        The GeneXpert MRSA Assay (FDA approved for NASAL specimens only), is one component of a comprehensive MRSA colonization surveillance program. It is not intended to diagnose MRSA infection nor to guide or monitor treatment for MRSA infections.    Studies/Results: Ct Head Wo Contrast  02/03/2014   CLINICAL DATA:  Severe headache in sensitivity to light. Nausea and neck pain. Fever.  EXAM: CT HEAD WITHOUT CONTRAST   TECHNIQUE: Contiguous axial images were obtained from the base of the skull through the vertex without intravenous contrast.  COMPARISON:  MRI brain 06/23/2012.  FINDINGS: No acute cortical infarct, hemorrhage, or mass lesion is present. The ventricles are of normal size. No significant extra-axial fluid collection is evident. The paranasal sinuses and mastoid air cells are clear. The calvarium is intact.  IMPRESSION: Negative CT of the head   Electronically Signed   By: Lawrence Santiago M.D.   On: 02/03/2014 15:40   Mr Lumbar Spine W Wo Contrast  02/03/2014   CLINICAL DATA:  Back surgery 01/11/2014. Now with severe headache with light sensitivity. Neck pain and fever. Rule out infection.  EXAM: MRI LUMBAR SPINE WITHOUT AND WITH CONTRAST  TECHNIQUE: Multiplanar and multiecho pulse sequences of the lumbar spine were obtained without and with intravenous contrast.  CONTRAST:  22m MULTIHANCE GADOBENATE DIMEGLUMINE 529 MG/ML IV SOLN  COMPARISON:  Lumbar MRI 11/19/2013  FINDINGS: Patient has congenital lumbar stenosis with a small canal throughout the lumbar spine. Conus medullaris is normal and terminates at L1. No evidence of discitis or osteomyelitis.  L1-2:  Negative  L2-3: Postop laminectomy on the left. There has been discectomy and removal of the disc protrusion on the left. There remains mild left-sided disc bulging and enhancement on the left side of the thecal sac related to recent surgery. There is a small fluid collection in the laminectomy defect. This small fluid collection extends posteriorly to the subcutaneous tissues and may be a small CSF leak. Infection not excluded.  L3-4:  Mild congenital stenosis.  Mild facet hypertrophy bilaterally  L4-5: Disc bulging and facet hypertrophy with mild to moderate spinal stenosis which is unchanged.  L5-S1:  Mild spinal stenosis unchanged.  IMPRESSION: Postop laminectomy and discectomy on the left at L2-3. No recurrent disc protrusion or stenosis. Small fluid  collection extends from the laminectomy defect posteriorly to the subcutaneous tissues and may represent postop fluid however CSF leak or infection not excluded.  Congenital lumbar stenosis.   Electronically Signed   By: CFranchot GalloM.D.   On: 02/03/2014 18:52   Medications: I have reviewed the patient's current medications. Scheduled Meds: . acetaZOLAMIDE  1,000 mg Oral BID  . cefTAZidime (FORTAZ)  IV  2 g Intravenous 3 times per day  . doxepin  30 mg Oral QHS  . enoxaparin (LOVENOX) injection  40 mg Subcutaneous Daily  . FLUoxetine  40 mg Oral Daily  . pantoprazole  40 mg Oral Daily  .  vancomycin  1,000 mg Intravenous Q12H   Continuous Infusions:  PRN Meds:.diazepam, HYDROmorphone, promethazine   Assessment/Plan:  Headache 2/2 to CSF leak vs less likely bacterial meningitis: ESR 33. Procalcitonin <0.1.  Neurosurgery consulted and recommendations appreciated. - Continue Ceftazidime 2 g IV Q8H and Vancomycin 1 g IV Q12H for not; can likely d/c soon since this does not appear to be infectious etiology  Pseudotumor Cerebri: Patient follows with Dr. Rexene Alberts at [facility]. She takes Diamox 1000 mg BID at home. She receives serial lumbar punctures as well with last one 2-3 months ago per patient. However, per records last documented LP on 02/04/13.  - Continue home Diamox - Will consider consulting neurology if headache does not improve  Depression/Anxiety: Patient takes Prozac 40 mg daily and Doxepin 30 mg QHS at home. - Continue home meds  Diet: Heart healthy DVT/PE PPx: Lovenox SQ  Dispo: Disposition is deferred at this time, awaiting improvement of current medical problems.  Anticipated discharge in approximately 1-2 day(s).   The patient does have a current PCP (Glenda Chroman, MD) and does not need an [facility] hospital follow-up appointment after discharge.  The patient does not have transportation limitations that hinder transportation to clinic appointments.  .Services  Needed at time of discharge: Y = Yes, Blank = No PT:   OT:   RN:   Equipment:   Other:     LOS: 2 days   Francesca Oman, DO 02/05/2014, 7:49 AM     Internal Medicine Attending Attestation  I saw and evaluated the patient.  I personally confirmed the key portions of the history and exam documented by Dr. Redmond Pulling and I reviewed pertinent patient test results.  The assessment, diagnosis, and plan were formulated together and I agree with the documentation in the resident's note.

## 2014-02-05 NOTE — Consult Note (Signed)
Reason for Consult: Back pain and headaches Referring Physician: Dr. Rayfield Sandra [name] is an 38 y.o. female.  HPI: Patient is a 38 year old individual who about 3 weeks ago underwent decompression at L2-L3 4 herniated nucleus pulposus. The patient states that she did well but she started develop significant headache some chills and discomfort. She was seen by her primary care physician and she was given a dose of IV Rocephin. She is then advised to come to the emergency department fearing that she had meningitis. She did not have a fever. She is not had a white count. In the emergency department an MRI of the lumbar spine was ordered and this revealed the presence of a small subcutaneous fluid collection in the surgical site. No significant abnormalities at the surgical site were noted to suggest an active infection.  The patient has nonetheless maintained persistent headache seems to be worse when she tries to stand up. She has been on IV antibiotics including Rocephin and vancomycin. There've been no documented fevers here. Because of the concerns of her persistent headache consultation is now sought for possibility of spinal fluid leak.  Past Medical History  Diagnosis Date  . Fibromyalgia   . Depression   . Anxiety   . Hypertension   . Migraines   . Chest pain     RHC/LHC in 2007 with normal PCWP and PA pressure, normal coronaries EF 55%  . Dyspnea   . POTS (postural orthostatic tachycardia syndrome)     possible, episodes of sinus tachycardis, holter in 2007 with sinus tachycardia with rates as high as 160  . Ulcer     duodenal in 1993  . GERD (gastroesophageal reflux disease)     with esophageal stricture  . Complication of anesthesia   . PONV (postoperative nausea and vomiting)     usually phenergan helps  . History of hiatal hernia     stomach ulcer ==  had one at age 62  . Esophageal dilatation     has had them 4-5 times already  . Endometriosis   . Vertigo   . S/P  Botox injection     for migraines    Past Surgical History  Procedure Laterality Date  . Cholecystectomy    . Bladder surgery      bladder tact  . Tubal ligation    . Abdominal hysterectomy    . Cardiac catheterization    . Esophageal dilation    . Cystoscopy w/ ureteral stent removal      Dr. Exie Parody in [place]  . Fluid removal      from brain twice.  last time 8-9 mths ago @ [facility] imaging  . Lumbar laminectomy/decompression microdiscectomy Left 01/11/2014    Procedure: LUMBAR LAMINECTOMY/DECOMPRESSION MICRODISCECTOMY 1 LEVEL LEFT LUMBAR TWO/THREE;  Surgeon: Newman Pies, MD;  Location: [place] [facility];  Service: Neurosurgery;  Laterality: Left;    Family History  Problem Relation Age of Onset  . Kidney cancer Mother   . Diabetes Mother   . Prostate cancer Maternal Grandfather   . Ulcers Father   . Pancreatic cancer Maternal Grandmother   . Lung cancer Paternal Grandmother     metastatic  . Coronary artery disease Father     pci at age 81  . Aortic aneurysm      Social History:  reports that she has never smoked. She does not have any smokeless tobacco history on file. She reports that she does not drink alcohol or use illicit drugs.  Allergies:  Allergies  Allergen Reactions  . Penicillins Hives and Swelling  . Topamax [Topiramate]     Gives her kidney stones  . Versed [Midazolam] Itching  . Ambien [Zolpidem Tartrate] Rash and Other (See Comments)    Pt states she cannot walk after taking this medication  . Hydrocodone-Acetaminophen Itching    States only Vicodin makes her itch  . Zolpidem Tartrate Itching and Rash    Medications: I have reviewed the patient's current medications.  Results for orders placed or performed during the hospital encounter of 02/03/14 (from the past 48 hour(s))  Culture, blood (routine x 2)     Status: None (Preliminary result)   Collection Time: 02/03/14  2:45 PM  Result Value Ref Range   Specimen Description BLOOD HAND RIGHT     Special Requests BOTTLES DRAWN AEROBIC AND ANAEROBIC 5CC    Culture  Setup Time      02/03/2014 20:39 Performed at [place] NO GROWTH TO DATE CULTURE WILL BE HELD FOR 5 DAYS BEFORE ISSUING A FINAL NEGATIVE REPORT Performed at [facility]    Report Status PENDING   Urinalysis, Routine w reflex microscopic     Status: None   Collection Time: 02/03/14  2:55 PM  Result Value Ref Range   Color, Urine YELLOW YELLOW   APPearance CLEAR CLEAR   Specific Gravity, Urine 1.013 1.005 - 1.030   pH 6.0 5.0 - 8.0   Glucose, UA NEGATIVE NEGATIVE mg/dL   Hgb urine dipstick NEGATIVE NEGATIVE   Bilirubin Urine NEGATIVE NEGATIVE   Ketones, ur NEGATIVE NEGATIVE mg/dL   Protein, ur NEGATIVE NEGATIVE mg/dL   Urobilinogen, UA 0.2 0.0 - 1.0 mg/dL   Nitrite NEGATIVE NEGATIVE   Leukocytes, UA NEGATIVE NEGATIVE    Comment: MICROSCOPIC NOT DONE ON URINES WITH NEGATIVE PROTEIN, BLOOD, LEUKOCYTES, NITRITE, OR GLUCOSE <1000 mg/dL.  MRSA PCR Screening     Status: None   Collection Time: 02/03/14 11:58 PM  Result Value Ref Range   MRSA by PCR NEGATIVE NEGATIVE    Comment:        The GeneXpert MRSA Assay (FDA approved for NASAL specimens only), is one component of a comprehensive MRSA colonization surveillance program. It is not intended to diagnose MRSA infection nor to guide or monitor treatment for MRSA infections.   Comprehensive metabolic panel     Status: Abnormal   Collection Time: 02/04/14  4:47 AM  Result Value Ref Range   Sodium 141 137 - 147 mEq/L   Potassium 3.8 3.7 - 5.3 mEq/L   Chloride 108 96 - 112 mEq/L   CO2 23 19 - 32 mEq/L   Glucose, Bld 85 70 - 99 mg/dL   BUN 13 6 - 23 mg/dL   Creatinine, Ser 1.05 0.50 - 1.10 mg/dL   Calcium 8.5 8.4 - 10.5 mg/dL   Total Protein 5.9 (L) 6.0 - 8.3 g/dL   Albumin 2.7 (L) 3.5 - 5.2 g/dL   AST 17 0 - 37 U/L   ALT 15 0 - 35 U/L   Alkaline Phosphatase 101 39 - 117 U/L   Total  Bilirubin <0.2 (L) 0.3 - 1.2 mg/dL   GFR calc non Af Amer 66 (L) >90 mL/min   GFR calc Af Amer 76 (L) >90 mL/min    Comment: (NOTE) The eGFR has been calculated using the CKD EPI equation. This calculation has not  been validated in all clinical situations. eGFR's persistently <90 mL/min signify possible Chronic Kidney Disease.    Anion gap 10 5 - 15  CBC with Differential     Status: Abnormal   Collection Time: 02/04/14  4:47 AM  Result Value Ref Range   WBC 4.4 4.0 - 10.5 K/uL   RBC 3.66 (L) 3.87 - 5.11 MIL/uL   Hemoglobin 11.1 (L) 12.0 - 15.0 g/dL   HCT 34.5 (L) 36.0 - 46.0 %   MCV 94.3 78.0 - 100.0 fL   MCH 30.3 26.0 - 34.0 pg   MCHC 32.2 30.0 - 36.0 g/dL   RDW 12.7 11.5 - 15.5 %   Platelets 154 150 - 400 K/uL   Neutrophils Relative % 59 43 - 77 %   Neutro Abs 2.6 1.7 - 7.7 K/uL   Lymphocytes Relative 31 12 - 46 %   Lymphs Abs 1.4 0.7 - 4.0 K/uL   Monocytes Relative 5 3 - 12 %   Monocytes Absolute 0.2 0.1 - 1.0 K/uL   Eosinophils Relative 5 0 - 5 %   Eosinophils Absolute 0.2 0.0 - 0.7 K/uL   Basophils Relative 0 0 - 1 %   Basophils Absolute 0.0 0.0 - 0.1 K/uL  C-reactive protein     Status: Abnormal   Collection Time: 02/04/14  4:47 AM  Result Value Ref Range   CRP <0.5 (L) <0.60 mg/dL    Comment: Performed at [facility]  Sedimentation rate     Status: Abnormal   Collection Time: 02/04/14  4:47 AM  Result Value Ref Range   Sed Rate 33 (H) 0 - 22 mm/hr  Procalcitonin     Status: None   Collection Time: 02/05/14  5:14 AM  Result Value Ref Range   Procalcitonin <0.10 ng/mL    Comment:        Interpretation: PCT (Procalcitonin) <= 0.5 ng/mL: Systemic infection (sepsis) is not likely. Local bacterial infection is possible. REPEATED TO VERIFY (NOTE)         ICU PCT Algorithm               Non ICU PCT Algorithm    ----------------------------     ------------------------------         PCT < 0.25 ng/mL                 PCT < 0.1 ng/mL     Stopping of  antibiotics            Stopping of antibiotics       strongly encouraged.               strongly encouraged.    ----------------------------     ------------------------------       PCT level decrease by               PCT < 0.25 ng/mL       >= 80% from peak PCT       OR PCT 0.25 - 0.5 ng/mL          Stopping of antibiotics                                             encouraged.     Stopping of antibiotics           encouraged.    ----------------------------     ------------------------------  PCT level decrease by              PCT >= 0.25 ng/mL       < 80% from peak PCT        AND PCT >= 0.5 ng/mL             Continuing antibiotics                                              encouraged.       Continuing antibiotics            encouraged.    ----------------------------     ------------------------------     PCT level increase compared          PCT > 0.5 ng/mL         with peak PCT AND          PCT >= 0.5 ng/mL             Escalation of antibiotics                                          strongly encouraged.      Escalation of antibiotics        strongly encouraged.   CBC     Status: Abnormal   Collection Time: 02/05/14  5:14 AM  Result Value Ref Range   WBC 4.8 4.0 - 10.5 K/uL   RBC 3.59 (L) 3.87 - 5.11 MIL/uL   Hemoglobin 10.5 (L) 12.0 - 15.0 g/dL   HCT 33.1 (L) 36.0 - 46.0 %   MCV 92.2 78.0 - 100.0 fL   MCH 29.2 26.0 - 34.0 pg   MCHC 31.7 30.0 - 36.0 g/dL   RDW 12.5 11.5 - 15.5 %   Platelets 159 150 - 400 K/uL  Basic metabolic panel     Status: Abnormal   Collection Time: 02/05/14  5:14 AM  Result Value Ref Range   Sodium 137 137 - 147 mEq/L   Potassium 3.8 3.7 - 5.3 mEq/L   Chloride 107 96 - 112 mEq/L   CO2 18 (L) 19 - 32 mEq/L   Glucose, Bld 96 70 - 99 mg/dL   BUN 10 6 - 23 mg/dL   Creatinine, Ser 0.99 0.50 - 1.10 mg/dL   Calcium 8.5 8.4 - 10.5 mg/dL   GFR calc non Af Amer 71 (L) >90 mL/min   GFR calc Af Amer 82 (L) >90 mL/min    Comment: (NOTE) The eGFR  has been calculated using the CKD EPI equation. This calculation has not been validated in all clinical situations. eGFR's persistently <90 mL/min signify possible Chronic Kidney Disease.    Anion gap 12 5 - 15    Ct Head Wo Contrast  02/03/2014   CLINICAL DATA:  Severe headache in sensitivity to light. Nausea and neck pain. Fever.  EXAM: CT HEAD WITHOUT CONTRAST  TECHNIQUE: Contiguous axial images were obtained from the base of the skull through the vertex without intravenous contrast.  COMPARISON:  MRI brain 06/23/2012.  FINDINGS: No acute cortical infarct, hemorrhage, or mass lesion is present. The ventricles are of normal size. No significant extra-axial fluid collection is evident. The paranasal sinuses and mastoid air cells are clear. The calvarium is intact.  IMPRESSION: Negative CT of the head   Electronically Signed   By: Lawrence Santiago M.D.   On: 02/03/2014 15:40   Mr Lumbar Spine W Wo Contrast  02/03/2014   CLINICAL DATA:  Back surgery 01/11/2014. Now with severe headache with light sensitivity. Neck pain and fever. Rule out infection.  EXAM: MRI LUMBAR SPINE WITHOUT AND WITH CONTRAST  TECHNIQUE: Multiplanar and multiecho pulse sequences of the lumbar spine were obtained without and with intravenous contrast.  CONTRAST:  45m MULTIHANCE GADOBENATE DIMEGLUMINE 529 MG/ML IV SOLN  COMPARISON:  Lumbar MRI 11/19/2013  FINDINGS: Patient has congenital lumbar stenosis with a small canal throughout the lumbar spine. Conus medullaris is normal and terminates at L1. No evidence of discitis or osteomyelitis.  L1-2:  Negative  L2-3: Postop laminectomy on the left. There has been discectomy and removal of the disc protrusion on the left. There remains mild left-sided disc bulging and enhancement on the left side of the thecal sac related to recent surgery. There is a small fluid collection in the laminectomy defect. This small fluid collection extends posteriorly to the subcutaneous tissues and may be a  small CSF leak. Infection not excluded.  L3-4:  Mild congenital stenosis.  Mild facet hypertrophy bilaterally  L4-5: Disc bulging and facet hypertrophy with mild to moderate spinal stenosis which is unchanged.  L5-S1:  Mild spinal stenosis unchanged.  IMPRESSION: Postop laminectomy and discectomy on the left at L2-3. No recurrent disc protrusion or stenosis. Small fluid collection extends from the laminectomy defect posteriorly to the subcutaneous tissues and may represent postop fluid however CSF leak or infection not excluded.  Congenital lumbar stenosis.   Electronically Signed   By: CFranchot GalloM.D.   On: 02/03/2014 18:52    Review of Systems  Constitutional: Positive for malaise/fatigue and diaphoresis.  Musculoskeletal: Positive for back pain.  Neurological: Positive for sensory change, focal weakness and weakness.   Blood pressure 105/52, pulse 82, temperature 98.9 F (37.2 C), temperature source Oral, resp. rate 17, height _0  (1.676 m), weight 122.426 kg (269 lb 14.4 oz), SpO2 96 %. Physical Exam  Constitutional: She is oriented to person, place, and time.  HENT:  Head: Normocephalic and atraumatic.  Eyes: Conjunctivae and EOM are normal. Pupils are equal, round, and reactive to light.  Respiratory: Effort normal and breath sounds normal.  GI: Soft. Bowel sounds are normal.  Musculoskeletal:  Well healed midline back incision  Neurological: She is alert and oriented to person, place, and time.  Complains of headache no significant meningismus. Motor function appears intact in lower extremities. Inial nerve examination is within limits of normal  Skin: Skin is warm and dry.  Psychiatric: She has a normal mood and affect. Her behavior is normal. Judgment and thought content normal.    Assessment/Plan: Status post lumbar laminectomy discectomy 3 weeks ago. Persistent headache with some features of a spinal headache.  Keep patient at bedrest, I will discuss the situation with  Dr. JArnoldo Moralewill see her tomorrow and allow him to decide whether surgical intervention is warranted or what other therapy might P appropriate for consideration of spinal leakage.  Ireene Ballowe J 02/05/2014, 2:44 PM

## 2014-02-06 ENCOUNTER — Inpatient Hospital Stay (HOSPITAL_COMMUNITY): Payer: BC Managed Care – PPO

## 2014-02-06 LAB — BASIC METABOLIC PANEL
Anion gap: 10 (ref 5–15)
BUN: 7 mg/dL (ref 6–23)
CO2: 21 mEq/L (ref 19–32)
CREATININE: 0.97 mg/dL (ref 0.50–1.10)
Calcium: 8.5 mg/dL (ref 8.4–10.5)
Chloride: 110 mEq/L (ref 96–112)
GFR, EST AFRICAN AMERICAN: 84 mL/min — AB (ref >90)
GFR, EST NON AFRICAN AMERICAN: 73 mL/min — AB (ref >90)
Glucose, Bld: 100 mg/dL — ABNORMAL HIGH (ref 70–99)
POTASSIUM: 3.6 meq/L — AB (ref 3.7–5.3)
Sodium: 141 mEq/L (ref 137–147)

## 2014-02-06 LAB — CBC WITH DIFFERENTIAL/PLATELET
BASOS PCT: 1 % (ref 0–1)
Basophils Absolute: 0 10*3/uL (ref 0.0–0.1)
EOS ABS: 0.3 10*3/uL (ref 0.0–0.7)
Eosinophils Relative: 7 % — ABNORMAL HIGH (ref 0–5)
HCT: 32 % — ABNORMAL LOW (ref 36.0–46.0)
Hemoglobin: 10.2 g/dL — ABNORMAL LOW (ref 12.0–15.0)
Lymphocytes Relative: 32 % (ref 12–46)
Lymphs Abs: 1.4 10*3/uL (ref 0.7–4.0)
MCH: 29.6 pg (ref 26.0–34.0)
MCHC: 31.9 g/dL (ref 30.0–36.0)
MCV: 92.8 fL (ref 78.0–100.0)
MONOS PCT: 8 % (ref 3–12)
Monocytes Absolute: 0.4 10*3/uL (ref 0.1–1.0)
NEUTROS PCT: 52 % (ref 43–77)
Neutro Abs: 2.3 10*3/uL (ref 1.7–7.7)
Platelets: 150 10*3/uL (ref 150–400)
RBC: 3.45 MIL/uL — ABNORMAL LOW (ref 3.87–5.11)
RDW: 12.6 % (ref 11.5–15.5)
WBC: 4.4 10*3/uL (ref 4.0–10.5)

## 2014-02-06 MED ORDER — SODIUM CHLORIDE 0.9 % IJ SOLN
10.0000 mL | INTRAMUSCULAR | Status: DC | PRN
  Administered 2014-02-06 – 2014-02-09 (×4): 10 mL
  Filled 2014-02-06 (×4): qty 40

## 2014-02-06 MED ORDER — POTASSIUM CHLORIDE CRYS ER 20 MEQ PO TBCR
40.0000 meq | EXTENDED_RELEASE_TABLET | Freq: Once | ORAL | Status: AC
  Administered 2014-02-06: 40 meq via ORAL
  Filled 2014-02-06: qty 2

## 2014-02-06 NOTE — Progress Notes (Signed)
Patient needing an IV restart. Patient has required IV restarts frequently this admission and on IV vanc. Md notified about possible need for PICC line

## 2014-02-06 NOTE — Progress Notes (Signed)
ANTIBIOTIC CONSULT NOTE - FOLLOW UP  Pharmacy Consult for vancomycin Indication: r/o meningitis  Allergies  Allergen Reactions  . Penicillins Hives and Swelling  . Topamax [Topiramate]     Gives her kidney stones  . Versed [Midazolam] Itching  . Ambien [Zolpidem Tartrate] Rash and Other (See Comments)    Pt states she cannot walk after taking this medication  . Hydrocodone-Acetaminophen Itching    States only Vicodin makes her itch  . Zolpidem Tartrate Itching and Rash    Patient Measurements: Height: 5\' 6"  (167.6 cm) Weight: 269 lb 14.4 oz (122.426 kg) IBW/kg (Calculated) : 59.3  Vital Signs: Temp: 98.9 F (37.2 C) (12/14 1321) Temp Source: Oral (12/14 1321) BP: 98/40 mmHg (12/14 1321) Pulse Rate: 79 (12/14 1321) Intake/Output from previous day: 12/13 0701 - 12/14 0700 In: 610 [P.O.:360; IV Piggyback:250] Out: 700 [Urine:700] Intake/Output from this shift: Total I/O In: 240 [P.O.:240] Out: -   Labs:  Recent Labs  02/04/14 0447 02/05/14 0514 02/06/14 0557  WBC 4.4 4.8 4.4  HGB 11.1* 10.5* 10.2*  PLT 154 159 150  CREATININE 1.05 0.99 0.97   Estimated Creatinine Clearance: 103.9 mL/min (by C-G formula based on Cr of 0.97). No results for input(s): VANCOTROUGH, VANCOPEAK, VANCORANDOM, GENTTROUGH, GENTPEAK, GENTRANDOM, TOBRATROUGH, TOBRAPEAK, TOBRARND, AMIKACINPEAK, AMIKACINTROU, AMIKACIN in the last 72 hours.   Microbiology: Recent Results (from the past 720 hour(s))  Surgical pcr screen     Status: Abnormal   Collection Time: 01/10/14 12:41 PM  Result Value Ref Range Status   MRSA, PCR POSITIVE (A) NEGATIVE Final   Staphylococcus aureus POSITIVE (A) NEGATIVE Final    Comment:        The Xpert SA Assay (FDA approved for NASAL specimens in patients over 39 years of age), is one component of a comprehensive surveillance program.  Test performance has been validated by [facility] for patients greater than or equal to 39 year old. It is not intended to  diagnose infection nor to guide or monitor treatment.   Culture, blood (routine x 2)     Status: None (Preliminary result)   Collection Time: 02/03/14  2:40 PM  Result Value Ref Range Status   Specimen Description BLOOD ARM LEFT  Final   Special Requests BOTTLES DRAWN AEROBIC AND ANAEROBIC 5CC  Final   Culture  Setup Time   Final    02/03/2014 20:36 Performed at [facility]    Culture   Final           BLOOD CULTURE RECEIVED NO GROWTH TO DATE CULTURE WILL BE HELD FOR 5 DAYS BEFORE ISSUING A FINAL NEGATIVE REPORT Performed at [facility]    Report Status PENDING  Incomplete  Culture, blood (routine x 2)     Status: None (Preliminary result)   Collection Time: 02/03/14  2:45 PM  Result Value Ref Range Status   Specimen Description BLOOD HAND RIGHT  Final   Special Requests BOTTLES DRAWN AEROBIC AND ANAEROBIC 5CC  Final   Culture  Setup Time   Final    02/03/2014 20:39 Performed at [facility]    Culture   Final           BLOOD CULTURE RECEIVED NO GROWTH TO DATE CULTURE WILL BE HELD FOR 5 DAYS BEFORE ISSUING A FINAL NEGATIVE REPORT Performed at [facility]    Report Status PENDING  Incomplete  MRSA PCR Screening     Status: None   Collection Time: 02/03/14 11:58 PM  Result  Value Ref Range Status   MRSA by PCR NEGATIVE NEGATIVE Final    Comment:        The GeneXpert MRSA Assay (FDA approved for NASAL specimens only), is one component of a comprehensive MRSA colonization surveillance program. It is not intended to diagnose MRSA infection nor to guide or monitor treatment for MRSA infections.     Anti-infectives    Start     Dose/Rate Route Frequency Ordered Stop   02/04/14 1000  vancomycin (VANCOCIN) IVPB 1000 mg/200 mL premix     1,000 mg200 mL/hr over 60 Minutes Intravenous Every 12 hours 02/03/14 2214     02/03/14 2215  cefTAZidime (FORTAZ) 2 g in dextrose 5 % 50 mL IVPB     2 g100 mL/hr over 30 Minutes Intravenous 3 times per day  02/03/14 2211     02/03/14 2215  vancomycin (VANCOCIN) IVPB 1000 mg/200 mL premix     1,000 mg200 mL/hr over 60 Minutes Intravenous NOW 02/03/14 2214 02/03/14 2345   02/03/14 2030  vancomycin (VANCOCIN) IVPB 1000 mg/200 mL premix     1,000 mg200 mL/hr over 60 Minutes Intravenous  Once 02/03/14 2026 02/03/14 2246   02/03/14 2030  cefTRIAXone (ROCEPHIN) 1 g in dextrose 5 % 50 mL IVPB     1 g100 mL/hr over 30 Minutes Intravenous  Once 02/03/14 2026 02/03/14 2125      Assessment: Patient is a 38 y.o. F currently on vancomycin and ceftazidime for broad coverage.  Neurosurgery has low suspicion for meningitis but will plan on performing myelogram in 2 days.  Goal of Therapy:  Vancomycin trough level 15-20 mcg/ml  Plan:  1) cont Vancomycin 1gm IV q12h 2) cont ceftazidime 2mg  IV q8h per MD 3) will check vancomycin trough level with dose tonight to ensure current regimen is appropriate.  Sandra [name] P 02/06/2014,1:28 PM

## 2014-02-06 NOTE — Progress Notes (Signed)
Patient ID: Sandra PomfretChristy T Gill, female   DOB: Jan 26, 1976, 38 y.o.   MRN: [id number] I spoke with Delice Bisonara in radiology who informed me that the patient is on 2 medications, i.e. Prozac and Phenergan, which must be discontinued 48 hours prior to a lumbar puncture/myelogram. I will discontinue these medications anticipation of a myelogram in 2 days.

## 2014-02-06 NOTE — Progress Notes (Signed)
Patient ID: Sandra PomfretChristy T Gill, female   DOB: 05/28/1975, 38 y.o.   MRN: [id number] Subjective:  The patient is alert and pleasant. She is in no apparent distress. She complains of a bilateral headache which is not postural. She has had no drainage from her wound. The patient has a history of migraines, pseudotumor cerebri, fibromyalgia, etc.  Objective: Vital signs in last 24 hours: Temp:  [98.2 F (36.8 C)-100 F (37.8 C)] 98.2 F (36.8 C) (12/14 0519) Pulse Rate:  [83-95] 83 (12/14 0519) Resp:  [17-20] 17 (12/14 0519) BP: (95-118)/(55-58) 95/55 mmHg (12/14 0519) SpO2:  [94 %-97 %] 94 % (12/14 0519)  Intake/Output from previous day: 12/13 0701 - 12/14 0700 In: 610 [P.O.:360; IV Piggyback:250] Out: 700 [Urine:700] Intake/Output this shift: Total I/O In: 240 [P.O.:240] Out: -   Physical exam the patient is alert and pleasant. Her lumbar incision is well-healed without drainage or signs of infection. Her strength is normal in her lower extremities.  I have reviewed the patient's [facility]. It demonstrates her L2-3 herniated disc has been resected. There is no significant neural compression. There is no findings to suggest discitis. She does have a soft tissue small fluid collection, likely a resolving seroma.  Lab Results:  Recent Labs  02/05/14 0514 02/06/14 0557  WBC 4.8 4.4  HGB 10.5* 10.2*  HCT 33.1* 32.0*  PLT 159 150   BMET  Recent Labs  02/05/14 0514 02/06/14 0557  NA 137 141  K 3.8 3.6*  CL 107 110  CO2 18* 21  GLUCOSE 96 100*  BUN 10 7  CREATININE 0.99 0.97  CALCIUM 8.5 8.5    Studies/Results: No results found.  Assessment/Plan: Headaches: The patient has a known history of migraine headaches and pseudotumor cerebri. There is nothing to suggest bacterial meningitis since her wound looks good, she is afebrile, has a normal white count, normal C-reactive protein, and a near-normal sedimentation rate. I.e. I  think bacterial meningitis is quite unlikely, but not impossible.  As far as the possibility of a CSF fistula, I also think this is very unlikely. I did not see any spinal fluid at surgery. Her postoperative MRI is not show any large fluid collections and likely shows a resolving seroma. I.e. I feel that a spinal fluid leak is quite unlikely, but again not impossible. In summary, I think her headaches are most likely a result of her migraines or pseudotumor cerebri.  I have discussed a lumbar myelo CT with the patient. The patient has had lumbar punctures in the past to treat her pseudotumor cerebri. Unfortunately they resulted in post LP headaches and necessitated blood patches in the past. I have explained the procedure to her and we discussed the risks, mainly of post LP headaches. A spinal tap would allow us to obtain spinal fluid to rule out meningitis, to measure her opening pressure to see if her headaches are coming from her pseudotumor cerebri and renal fluid if necessary, and to inject dye to rule out a spinal fluid leak at the operative site. The patient would like to proceed with that study. I discussed this with Dr. Isabella BowensKrall.  LOS: 3 days     Normon Pettijohn D 02/06/2014, 12:44 PM

## 2014-02-06 NOTE — Progress Notes (Signed)
Peripherally Inserted Central Catheter/Midline Placement  The IV Nurse has discussed with the patient and/or persons authorized to consent for the patient, the purpose of this procedure and the potential benefits and risks involved with this procedure.  The benefits include less needle sticks, lab draws from the catheter and patient may be discharged home with the catheter.  Risks include, but not limited to, infection, bleeding, blood clot (thrombus formation), and puncture of an artery; nerve damage and irregular heat beat.  Alternatives to this procedure were also discussed.  PICC/Midline Placement Documentation  PICC / Midline Single Lumen 02/06/14 PICC Right Basilic 42 cm 1 cm (Active)  Indication for Insertion or Continuance of Line Poor Vasculature-patient has had multiple peripheral attempts or PIVs lasting less than 24 hours 02/06/2014  4:00 PM  Exposed Catheter (cm) 1 cm 02/06/2014  4:00 PM  Dressing Change Due 02/13/14 02/06/2014  4:00 PM       Stacie GlazeJoyce, Ellis Koffler Horton 02/06/2014, 4:53 PM

## 2014-02-06 NOTE — Progress Notes (Signed)
PT Cancellation Note  Patient Details Name: Sandra PomfretChristy T [name] MRN: [id number] DOB: 08/01/1975   Cancelled Treatment:    Reason Eval/Treat Not Completed: Medical issues which prohibited therapy.  Pt  Has orders for bedrest with bathroom privileges and has been up with family and nursing within those parameters.  Will see as activity orders are upgraded.     02/06/2014  [place] BingKen Amaurie Wandel, PT [phone]  (pager)   [place] Folmar, Eliseo GumKenneth V 02/06/2014, 4:29 PM

## 2014-02-06 NOTE — Progress Notes (Signed)
Subjective: She reports her headache is unchanged.  Also with bilateral thigh/leg pain.  No paresthesias. She denies saddle/leg anesthesia or loss of bowel or bladder control.  Denies weakness.  Objective: Vital signs in last 24 hours: Filed Vitals:   02/05/14 0557 02/05/14 1503 02/05/14 2205 02/06/14 0519  BP: 105/52 101/58 118/57 95/55  Pulse: 82 95 86 83  Temp: 98.9 F (37.2 C) 100 F (37.8 C) 98.8 F (37.1 C) 98.2 F (36.8 C)  TempSrc: Oral Oral Axillary Oral  Resp: '17 18 20 17  ' Height:      Weight:      SpO2: 96% 95% 97% 94%   Weight change:   Intake/Output Summary (Last 24 hours) at 02/06/14 1136 Last data filed at 02/06/14 0960  Gross per 24 hour  Intake    290 ml  Output    700 ml  Net   -410 ml   General: alert in NAD HEENT: [place]/AT, EOMI CV: RRR, normal S1/S2, no m/g/r Pulm: CTA bilaterally, breaths non-labored Abd: BS+, soft, non-tender Ext: warm, no edema, moves all Neuro: alert and oriented x 3, CNs III-XII intact, 5/5 strength B/L UE and LE. Sensation intact bilaterally.  Skin: No rashes present. Small surgical incision on lower back appears clean, dry, intact, no erythema or drainage. Appropriately tender to palpation.  Lab Results: Basic Metabolic Panel:  Recent Labs Lab 02/05/14 0514 02/06/14 0557  NA 137 141  K 3.8 3.6*  CL 107 110  CO2 18* 21  GLUCOSE 96 100*  BUN 10 7  CREATININE 0.99 0.97  CALCIUM 8.5 8.5   Liver Function Tests:  Recent Labs Lab 02/04/14 0447  AST 17  ALT 15  ALKPHOS 101  BILITOT <0.2*  PROT 5.9*  ALBUMIN 2.7*   CBC:  Recent Labs Lab 02/04/14 0447 02/05/14 0514 02/06/14 0557  WBC 4.4 4.8 4.4  NEUTROABS 2.6  --  2.3  HGB 11.1* 10.5* 10.2*  HCT 34.5* 33.1* 32.0*  MCV 94.3 92.2 92.8  PLT 154 159 150   Urinalysis:  Recent Labs Lab 02/03/14 1455  [place]  LABSPEC 1.013  PHURINE 6.0  GLUCOSEU NEGATIVE  HGBUR NEGATIVE  BILIRUBINUR NEGATIVE  KETONESUR NEGATIVE  PROTEINUR NEGATIVE    UROBILINOGEN 0.2  NITRITE NEGATIVE  LEUKOCYTESUR NEGATIVE    Micro Results: Recent Results (from the past 240 hour(s))  Culture, blood (routine x 2)     Status: None (Preliminary result)   Collection Time: 02/03/14  2:40 PM  Result Value Ref Range Status   Specimen Description BLOOD ARM LEFT  Final   Special Requests BOTTLES DRAWN AEROBIC AND ANAEROBIC 5CC  Final   Culture  Setup Time   Final    02/03/2014 20:36 Performed at [facility]    Culture   Final           BLOOD CULTURE RECEIVED NO GROWTH TO DATE CULTURE WILL BE HELD FOR 5 DAYS BEFORE ISSUING A FINAL NEGATIVE REPORT Performed at [facility]    Report Status PENDING  Incomplete  Culture, blood (routine x 2)     Status: None (Preliminary result)   Collection Time: 02/03/14  2:45 PM  Result Value Ref Range Status   Specimen Description BLOOD HAND RIGHT  Final   Special Requests BOTTLES DRAWN AEROBIC AND ANAEROBIC 5CC  Final   Culture  Setup Time   Final    02/03/2014 20:39 Performed at [place]   Final  BLOOD CULTURE RECEIVED NO GROWTH TO DATE CULTURE WILL BE HELD FOR 5 DAYS BEFORE ISSUING A FINAL NEGATIVE REPORT Performed at [facility]    Report Status PENDING  Incomplete  MRSA PCR Screening     Status: None   Collection Time: 02/03/14 11:58 PM  Result Value Ref Range Status   MRSA by PCR NEGATIVE NEGATIVE Final    Comment:        The GeneXpert MRSA Assay (FDA approved for NASAL specimens only), is one component of a comprehensive MRSA colonization surveillance program. It is not intended to diagnose MRSA infection nor to guide or monitor treatment for MRSA infections.    Studies/Results: No results found. Medications: I have reviewed the patient's current medications. Scheduled Meds: . acetaZOLAMIDE  1,000 mg Oral BID  . cefTAZidime (FORTAZ)  IV  2 g Intravenous 3 times per day  . doxepin  30 mg Oral QHS  . enoxaparin (LOVENOX) injection   40 mg Subcutaneous Daily  . FLUoxetine  40 mg Oral Daily  . pantoprazole  40 mg Oral Daily  . potassium chloride  40 mEq Oral Once  . vancomycin  1,000 mg Intravenous Q12H   Continuous Infusions:  PRN Meds:.diazepam, HYDROmorphone, promethazine   Assessment/Plan:  Headache 2/2 to CSF leak vs less likely bacterial meningitis: ESR 33. Procalcitonin <0.1 x 2.  Neurosurgery consulted and recommendations appreciated. - Continue Ceftazidime 2 g IV Q8H and Vancomycin 1 g IV Q12H for not; awaiting recs from Dr. Arnoldo Morale - can likely d/c soon since this does not appear to be infectious etiology  Pseudotumor Cerebri: Patient follows with Dr. Rexene Alberts at [facility]. She takes Diamox 1000 mg BID at home. She receives serial lumbar punctures as well with last one 2-3 months ago per patient. However, per records last documented LP on 02/04/13.  - Continue home Diamox  Depression/Anxiety: Patient takes Prozac 40 mg daily and Doxepin 30 mg QHS at home. - Continue home meds  Diet: Heart healthy DVT/PE PPx: Lovenox SQ  Dispo: Disposition is deferred at this time, awaiting improvement of current medical problems.  Anticipated discharge in approximately 1-2 day(s).   The patient does have a current PCP (Glenda Chroman, MD) and does not need an [facility] hospital follow-up appointment after discharge.  The patient does not have transportation limitations that hinder transportation to clinic appointments.  .Services Needed at time of discharge: Y = Yes, Blank = No PT:   OT:   RN:   Equipment:   Other:     LOS: 3 days   Jacques Earthly, MD 02/06/2014, 11:36 AM

## 2014-02-06 NOTE — Progress Notes (Signed)
MD - patient had loss of IV access. Current IV is patient's 3rd peripheral IV this admission. IV team nurse is recommending PICC placement if patient will be on antibiotics for extended period of time this admission.

## 2014-02-07 LAB — VANCOMYCIN, TROUGH: Vancomycin Tr: 11.5 ug/mL (ref 10.0–20.0)

## 2014-02-07 LAB — BASIC METABOLIC PANEL
Anion gap: 12 (ref 5–15)
BUN: 8 mg/dL (ref 6–23)
CO2: 21 mEq/L (ref 19–32)
CREATININE: 0.89 mg/dL (ref 0.50–1.10)
Calcium: 8.6 mg/dL (ref 8.4–10.5)
Chloride: 106 mEq/L (ref 96–112)
GFR, EST NON AFRICAN AMERICAN: 81 mL/min — AB (ref >90)
Glucose, Bld: 98 mg/dL (ref 70–99)
POTASSIUM: 3.7 meq/L (ref 3.7–5.3)
Sodium: 139 mEq/L (ref 137–147)

## 2014-02-07 LAB — PROCALCITONIN

## 2014-02-07 LAB — CBC
HCT: 31.9 % — ABNORMAL LOW (ref 36.0–46.0)
Hemoglobin: 10.2 g/dL — ABNORMAL LOW (ref 12.0–15.0)
MCH: 29.3 pg (ref 26.0–34.0)
MCHC: 32 g/dL (ref 30.0–36.0)
MCV: 91.7 fL (ref 78.0–100.0)
Platelets: 143 10*3/uL — ABNORMAL LOW (ref 150–400)
RBC: 3.48 MIL/uL — ABNORMAL LOW (ref 3.87–5.11)
RDW: 12.8 % (ref 11.5–15.5)
WBC: 4.5 10*3/uL (ref 4.0–10.5)

## 2014-02-07 MED ORDER — VANCOMYCIN HCL 10 G IV SOLR
1250.0000 mg | Freq: Two times a day (BID) | INTRAVENOUS | Status: DC
  Administered 2014-02-07 – 2014-02-08 (×3): 1250 mg via INTRAVENOUS
  Filled 2014-02-07 (×5): qty 1250

## 2014-02-07 MED ORDER — KETOROLAC TROMETHAMINE 15 MG/ML IJ SOLN
15.0000 mg | Freq: Four times a day (QID) | INTRAMUSCULAR | Status: DC | PRN
  Administered 2014-02-07 – 2014-02-09 (×7): 15 mg via INTRAVENOUS
  Filled 2014-02-07 (×6): qty 1

## 2014-02-07 MED ORDER — DOCUSATE SODIUM 100 MG PO CAPS
100.0000 mg | ORAL_CAPSULE | Freq: Every day | ORAL | Status: DC
  Administered 2014-02-07 – 2014-02-09 (×3): 100 mg via ORAL
  Filled 2014-02-07 (×3): qty 1

## 2014-02-07 NOTE — Progress Notes (Signed)
ANTIBIOTIC CONSULT NOTE Pharmacy Consult for vancomycin Indication: possible meningitis  Allergies  Allergen Reactions  . Penicillins Hives and Swelling  . Topamax [Topiramate]     Gives her kidney stones  . Versed [Midazolam] Itching  . Ambien [Zolpidem Tartrate] Rash and Other (See Comments)    Pt states she cannot walk after taking this medication  . Hydrocodone-Acetaminophen Itching    States only Vicodin makes her itch  . Zolpidem Tartrate Itching and Rash   Height: 5\' 6"  (167.6 cm) Weight: 269 lb 14.4 oz (122.426 kg) IBW/kg (Calculated) : 59.3  Vital Signs: Temp: 98.9 F (37.2 C) (12/14 2202) Temp Source: Oral (12/14 2202) BP: 99/57 mmHg (12/14 2202) Pulse Rate: 82 (12/14 2202) Intake/Output from previous day: 12/14 0701 - 12/15 0700 In: 1048 [P.O.:838; IV Piggyback:200] Out: -  Intake/Output from this shift:    Labs:  Recent Labs  02/04/14 0447 02/05/14 0514 02/06/14 0557  WBC 4.4 4.8 4.4  HGB 11.1* 10.5* 10.2*  PLT 154 159 150  CREATININE 1.05 0.99 0.97   Estimated Creatinine Clearance: 103.9 mL/min (by C-G formula based on Cr of 0.97).  Recent Labs  02/06/14 2321  VANCOTROUGH 11.5     Microbiology: Recent Results (from the past 720 hour(s))  Surgical pcr screen     Status: Abnormal   Collection Time: 01/10/14 12:41 PM  Result Value Ref Range Status   MRSA, PCR POSITIVE (A) NEGATIVE Final   Staphylococcus aureus POSITIVE (A) NEGATIVE Final    Comment:        The Xpert SA Assay (FDA approved for NASAL specimens in patients over 39 years of age), is one component of a comprehensive surveillance program.  Test performance has been validated by [facility] for patients greater than or equal to 39 year old. It is not intended to diagnose infection nor to guide or monitor treatment.   Culture, blood (routine x 2)     Status: None (Preliminary result)   Collection Time: 02/03/14  2:40 PM  Result Value Ref Range Status   Specimen  Description BLOOD ARM LEFT  Final   Special Requests BOTTLES DRAWN AEROBIC AND ANAEROBIC 5CC  Final   Culture  Setup Time   Final    02/03/2014 20:36 Performed at [facility]    Culture   Final           BLOOD CULTURE RECEIVED NO GROWTH TO DATE CULTURE WILL BE HELD FOR 5 DAYS BEFORE ISSUING A FINAL NEGATIVE REPORT Performed at [facility]    Report Status PENDING  Incomplete  Culture, blood (routine x 2)     Status: None (Preliminary result)   Collection Time: 02/03/14  2:45 PM  Result Value Ref Range Status   Specimen Description BLOOD HAND RIGHT  Final   Special Requests BOTTLES DRAWN AEROBIC AND ANAEROBIC 5CC  Final   Culture  Setup Time   Final    02/03/2014 20:39 Performed at [facility]    Culture   Final           BLOOD CULTURE RECEIVED NO GROWTH TO DATE CULTURE WILL BE HELD FOR 5 DAYS BEFORE ISSUING A FINAL NEGATIVE REPORT Performed at [facility]    Report Status PENDING  Incomplete  MRSA PCR Screening     Status: None   Collection Time: 02/03/14 11:58 PM  Result Value Ref Range Status   MRSA by PCR NEGATIVE NEGATIVE Final    Comment:  The GeneXpert MRSA Assay (FDA approved for NASAL specimens only), is one component of a comprehensive MRSA colonization surveillance program. It is not intended to diagnose MRSA infection nor to guide or monitor treatment for MRSA infections.     Assessment: 38 y.o F s/p lumbar laminectomy/decompression, possible meningitis, for empiric antibiotics  Goal of Therapy:  Vancomycin trough level 15-20 mcg/ml  Plan:  Change vancomycin 1250 mg IV q12h  Eddie CandleAbbott, Iliyana Convey [place] 02/07/2014,12:45 AM

## 2014-02-07 NOTE — Progress Notes (Signed)
Subjective: She reports her headache is unchanged.  Unchanged bilateral leg pain. No paresthesias or weakness. Some nausea but no emesis. Denies SOB or CP.  Objective: Vital signs in last 24 hours: Filed Vitals:   02/06/14 0519 02/06/14 1321 02/06/14 2202 02/07/14 0640  BP: '95/55 98/40 99/57 ' 94/45  Pulse: 83 79 82 72  Temp: 98.2 F (36.8 C) 98.9 F (37.2 C) 98.9 F (37.2 C) 98 F (36.7 C)  TempSrc: Oral Oral Oral Oral  Resp: '17 18 13 16  ' Height:      Weight:      SpO2: 94% 96% 95% 94%   Weight change:   Intake/Output Summary (Last 24 hours) at 02/07/14 0859 Last data filed at 02/07/14 0700  Gross per 24 hour  Intake   1048 ml  Output    550 ml  Net    498 ml   General: alert in NAD HEENT: [place]/AT, EOMI CV: RRR, normal S1/S2, no m/g/r Pulm: CTA bilaterally, breaths non-labored Abd: BS+, soft, non-tender Ext: warm, no edema, moves all Neuro: alert and oriented x 3, CNs III-XII intact, 5/5 strength B/L UE and LE. Sensation intact bilaterally.  Skin: No rashes present. Small surgical incision on lower back appears clean, dry, intact, no erythema or drainage. Appropriately tender to palpation.  Lab Results: Basic Metabolic Panel:  Recent Labs Lab 02/06/14 0557 02/07/14 0531  NA 141 139  K 3.6* 3.7  CL 110 106  CO2 21 21  GLUCOSE 100* 98  BUN 7 8  CREATININE 0.97 0.89  CALCIUM 8.5 8.6   Liver Function Tests:  Recent Labs Lab 02/04/14 0447  AST 17  ALT 15  ALKPHOS 101  BILITOT <0.2*  PROT 5.9*  ALBUMIN 2.7*   CBC:  Recent Labs Lab 02/04/14 0447  02/06/14 0557 02/07/14 0531  WBC 4.4  < > 4.4 4.5  NEUTROABS 2.6  --  2.3  --   HGB 11.1*  < > 10.2* 10.2*  HCT 34.5*  < > 32.0* 31.9*  MCV 94.3  < > 92.8 91.7  PLT 154  < > 150 143*  < > = values in this interval not displayed. Urinalysis:  Recent Labs Lab 02/03/14 1455  COLORURINE YELLOW  LABSPEC 1.013  PHURINE 6.0  GLUCOSEU NEGATIVE  HGBUR NEGATIVE  BILIRUBINUR NEGATIVE  KETONESUR  NEGATIVE  PROTEINUR NEGATIVE  UROBILINOGEN 0.2  NITRITE NEGATIVE  LEUKOCYTESUR NEGATIVE    Micro Results: Recent Results (from the past 240 hour(s))  Culture, blood (routine x 2)     Status: None (Preliminary result)   Collection Time: 02/03/14  2:40 PM  Result Value Ref Range Status   Specimen Description BLOOD ARM LEFT  Final   Special Requests BOTTLES DRAWN AEROBIC AND ANAEROBIC 5CC  Final   Culture  Setup Time   Final    02/03/2014 20:36 Performed at [facility]    Culture   Final           BLOOD CULTURE RECEIVED NO GROWTH TO DATE CULTURE WILL BE HELD FOR 5 DAYS BEFORE ISSUING A FINAL NEGATIVE REPORT Performed at [facility]    Report Status PENDING  Incomplete  Culture, blood (routine x 2)     Status: None (Preliminary result)   Collection Time: 02/03/14  2:45 PM  Result Value Ref Range Status   Specimen Description BLOOD HAND RIGHT  Final   Special Requests BOTTLES DRAWN AEROBIC AND ANAEROBIC 5CC  Final   Culture  Setup Time   Final  02/03/2014 20:39 Performed at [facility]    Culture   Final           BLOOD CULTURE RECEIVED NO GROWTH TO DATE CULTURE WILL BE HELD FOR 5 DAYS BEFORE ISSUING A FINAL NEGATIVE REPORT Performed at [facility]    Report Status PENDING  Incomplete  MRSA PCR Screening     Status: None   Collection Time: 02/03/14 11:58 PM  Result Value Ref Range Status   MRSA by PCR NEGATIVE NEGATIVE Final    Comment:        The GeneXpert MRSA Assay (FDA approved for NASAL specimens only), is one component of a comprehensive MRSA colonization surveillance program. It is not intended to diagnose MRSA infection nor to guide or monitor treatment for MRSA infections.    Studies/Results: No results found. Medications: I have reviewed the patient's current medications. Scheduled Meds: . acetaZOLAMIDE  1,000 mg Oral BID  . cefTAZidime (FORTAZ)  IV  2 g Intravenous 3 times per day  . doxepin  30 mg Oral QHS  .  pantoprazole  40 mg Oral Daily  . vancomycin  1,250 mg Intravenous Q12H   Continuous Infusions:  PRN Meds:.diazepam, HYDROmorphone, sodium chloride   Assessment/Plan:  Headache 2/2 to Chronic Migraine vs CSF leak vs less likely bacterial meningitis: ESR 33. Procalcitonin <0.1 x 2.  Neurosurgery consulted and recommendations appreciated. - Continue Ceftazidime 2 g IV Q8H and Vancomycin 1 g IV Q12H - CT myleogram tomorrow - Hold Lovenox prior  Pseudotumor Cerebri: Patient follows with Dr. Rexene Alberts at [facility]. She takes Diamox 1000 mg BID at home. She receives serial lumbar punctures. - Continue home Diamox  Depression/Anxiety: Patient takes Prozac 40 mg daily and Doxepin 30 mg QHS at home. - Continue home meds. Prozac held for CT myelogram  Diet: Heart healthy DVT/PE PPx: Lovenox SQ. Hold tomorrow's dose.  Dispo: Disposition is deferred at this time, awaiting improvement of current medical problems.  Anticipated discharge in approximately 1-2 day(s).   The patient does have a current PCP (Glenda Chroman, MD) and does not need an [facility] hospital follow-up appointment after discharge.  The patient does not have transportation limitations that hinder transportation to clinic appointments.  .Services Needed at time of discharge: Y = Yes, Blank = No PT:   OT:   RN:   Equipment:   Other:     LOS: 4 days   Jacques Earthly, MD 02/07/2014, 8:59 AM

## 2014-02-07 NOTE — Progress Notes (Signed)
Patient ID: Sandra [name], female   DOB: 09/07/1975, 38 y.o.   MRN: [id number] Subjective:  The patient is alert and pleasant. She continues to complain of a headache. She is sitting up in bed, her headache is not postural.  Objective: Vital signs in last 24 hours: Temp:  [98 F (36.7 C)-98.9 F (37.2 C)] 98 F (36.7 C) (12/15 0640) Pulse Rate:  [72-82] 72 (12/15 0640) Resp:  [13-18] 16 (12/15 0640) BP: (94-99)/(40-57) 94/45 mmHg (12/15 0640) SpO2:  [94 %-96 %] 94 % (12/15 0640)  Intake/Output from previous day: 12/14 0701 - 12/15 0700 In: 1048 [P.O.:838; IV Piggyback:200] Out: 550 [Urine:550] Intake/Output this shift:    Physical exam the patient is alert and oriented. Strength is normal.  Lab Results:  Recent Labs  02/06/14 0557 02/07/14 0531  WBC 4.4 4.5  HGB 10.2* 10.2*  HCT 32.0* 31.9*  PLT 150 143*   BMET  Recent Labs  02/06/14 0557 02/07/14 0531  NA 141 139  K 3.6* 3.7  CL 110 106  CO2 21 21  GLUCOSE 100* 98  BUN 7 8  CREATININE 0.97 0.89  CALCIUM 8.5 8.6    Studies/Results: No results found.  Assessment/Plan: Headaches: I think it's much more likely that her headaches are from her migraines or her pseudotumor cerebri than from meningitis or CSF leak. In order to resolve this issue we will plan to do a myelo CT tomorrow. We will need to DC her Lovenox prior to the myelogram.  LOS: 4 days     Lavontay Kirk D 02/07/2014, 7:47 AM

## 2014-02-07 NOTE — Progress Notes (Signed)
PT Cancellation Note  Patient Details Name: Germaine PomfretChristy T Gill MRN: [id number] DOB: 1975-12-17   Cancelled Treatment:    Reason Eval/Treat Not Completed: Medical issues which prohibited therapy;Other (comment) (pt on modified bedrest, activities have not been upgraded).  Will see pt as soon as the activity orders are upgraded.  Sticky note left for MD's.  Unable to get through to neurosurgeon on phone. 02/07/2014  [place] BingKen Chandi Nicklin, PT [phone]  (pager)   Anabia Weatherwax, Eliseo GumKenneth V 02/07/2014, 2:58 PM

## 2014-02-08 ENCOUNTER — Inpatient Hospital Stay (HOSPITAL_COMMUNITY): Payer: BC Managed Care – PPO

## 2014-02-08 DIAGNOSIS — G960 Cerebrospinal fluid leak: Secondary | ICD-10-CM

## 2014-02-08 DIAGNOSIS — G43709 Chronic migraine without aura, not intractable, without status migrainosus: Secondary | ICD-10-CM

## 2014-02-08 LAB — BASIC METABOLIC PANEL
Anion gap: 9 (ref 5–15)
BUN: 11 mg/dL (ref 6–23)
CALCIUM: 8.6 mg/dL (ref 8.4–10.5)
CHLORIDE: 107 meq/L (ref 96–112)
CO2: 21 meq/L (ref 19–32)
Creatinine, Ser: 0.84 mg/dL (ref 0.50–1.10)
GFR calc Af Amer: >90 mL/min (ref >90)
GFR calc non Af Amer: 86 mL/min — ABNORMAL LOW (ref >90)
Glucose, Bld: 86 mg/dL (ref 70–99)
Potassium: 3.5 mEq/L — ABNORMAL LOW (ref 3.7–5.3)
SODIUM: 137 meq/L (ref 137–147)

## 2014-02-08 LAB — CBC
HCT: 31.5 % — ABNORMAL LOW (ref 36.0–46.0)
HEMOGLOBIN: 10.1 g/dL — AB (ref 12.0–15.0)
MCH: 29.1 pg (ref 26.0–34.0)
MCHC: 32.1 g/dL (ref 30.0–36.0)
MCV: 90.8 fL (ref 78.0–100.0)
Platelets: 146 10*3/uL — ABNORMAL LOW (ref 150–400)
RBC: 3.47 MIL/uL — AB (ref 3.87–5.11)
RDW: 12.7 % (ref 11.5–15.5)
WBC: 4.7 10*3/uL (ref 4.0–10.5)

## 2014-02-08 LAB — CSF CELL COUNT WITH DIFFERENTIAL
RBC Count, CSF: 4 /mm3 — ABNORMAL HIGH
TUBE #: 4
WBC, CSF: 3 /mm3 (ref 0–5)

## 2014-02-08 LAB — GLUCOSE, CSF: GLUCOSE CSF: 51 mg/dL (ref 43–76)

## 2014-02-08 LAB — GRAM STAIN

## 2014-02-08 LAB — PROTEIN, CSF: Total  Protein, CSF: 42 mg/dL (ref 15–45)

## 2014-02-08 MED ORDER — ONDANSETRON HCL 4 MG/2ML IJ SOLN: 4.0000 mg | Freq: Four times a day (QID) | INTRAMUSCULAR | Status: DC | PRN

## 2014-02-08 MED ORDER — LORAZEPAM 2 MG/ML IJ SOLN
0.5000 mg | Freq: Once | INTRAMUSCULAR | Status: AC | PRN
  Administered 2014-02-08: 0.5 mg via INTRAVENOUS
  Filled 2014-02-08: qty 1

## 2014-02-08 MED ORDER — KETOROLAC TROMETHAMINE 30 MG/ML IJ SOLN
INTRAMUSCULAR | Status: AC
  Filled 2014-02-08: qty 1

## 2014-02-08 MED ORDER — IOHEXOL 180 MG/ML  SOLN
20.0000 mL | Freq: Once | INTRAMUSCULAR | Status: AC | PRN
  Administered 2014-02-08: 20 mL via INTRATHECAL

## 2014-02-08 NOTE — Progress Notes (Signed)
Patient ID: Sandra PomfretChristy T Gill, female   DOB: 09-10-1975, 38 y.o.   MRN: [id number] Subjective:  The patient is alert and pleasant. She says her headache is "a little better". She wants to proceed with a myelo CT.  Objective: Vital signs in last 24 hours: Temp:  [98.2 F (36.8 C)-99.4 F (37.4 C)] 98.2 F (36.8 C) (12/16 0517) Pulse Rate:  [77-84] 77 (12/16 0517) Resp:  [12-18] 16 (12/16 0517) BP: (82-131)/(49-60) 131/56 mmHg (12/16 0517) SpO2:  [95 %-100 %] 100 % (12/16 0517)  Intake/Output from previous day: 12/15 0701 - 12/16 0700 In: 1070 [P.O.:720; IV Piggyback:350] Out: 1100 [Urine:1100] Intake/Output this shift: Total I/O In: 0  Out: 300 [Urine:300]  Physical exam the patient is alert and oriented 3. Her strength is normal. Her lumbar incision is healing well.  Lab Results:  Recent Labs  02/07/14 0531 02/08/14 0511  WBC 4.5 4.7  HGB 10.2* 10.1*  HCT 31.9* 31.5*  PLT 143* 146*   BMET  Recent Labs  02/07/14 0531 02/08/14 0511  NA 139 137  K 3.7 3.5*  CL 106 107  CO2 21 21  GLUCOSE 98 86  BUN 8 11  CREATININE 0.89 0.84  CALCIUM 8.6 8.6    Studies/Results: No results found.  Assessment/Plan: Headaches: The patient is going to get a lumbar mild CT in order to rule out CSF leak at the surgical site at L2-3, meningitis, intracranial hypertension from pseudotumor cerebri, etc. I have answered all her questions.  LOS: 5 days     Whalen Trompeter D 02/08/2014, 10:50 AM

## 2014-02-08 NOTE — Progress Notes (Signed)
Subjective: Patient reporting that her headache has improved with toradol. She states that dilaudid has made her feel loopy in the past. Her nausea has also remained stable but no emesis.   Objective: Vital signs in last 24 hours: Filed Vitals:   02/07/14 1307 02/07/14 1317 02/07/14 2226 02/08/14 0517  BP: 82/49 98/60 102/54 131/56  Pulse: 84  82 77  Temp: 98.4 F (36.9 C)  99.4 F (37.4 C) 98.2 F (36.8 C)  TempSrc: Oral  Oral Oral  Resp: '18  12 16  ' Height:      Weight:      SpO2: 95%  99% 100%   Weight change:   Intake/Output Summary (Last 24 hours) at 02/08/14 0955 Last data filed at 02/08/14 0925  Gross per 24 hour  Intake   1070 ml  Output   1400 ml  Net   -330 ml   General: alert in NAD HEENT: /AT, EOMI CV: RRR, normal S1/S2, no m/g/r Pulm: CTA bilaterally, breaths non-labored Abd: BS+, soft, non-tender Ext: warm, no edema, moves all Neuro: alert and oriented x 3, CNs III-XII intact, 5/5 strength B/L UE and LE. Sensation intact bilaterally.  Skin: No rashes present. Small surgical incision on lower back appears clean, dry, intact, no erythema or drainage. Appropriately tender to palpation.  Lab Results: Basic Metabolic Panel:  Recent Labs Lab 02/07/14 0531 02/08/14 0511  NA 139 137  K 3.7 3.5*  CL 106 107  CO2 21 21  GLUCOSE 98 86  BUN 8 11  CREATININE 0.89 0.84  CALCIUM 8.6 8.6   Liver Function Tests:  Recent Labs Lab 02/04/14 0447  AST 17  ALT 15  ALKPHOS 101  BILITOT <0.2*  PROT 5.9*  ALBUMIN 2.7*   CBC:  Recent Labs Lab 02/04/14 0447  02/06/14 0557 02/07/14 0531 02/08/14 0511  WBC 4.4  < > 4.4 4.5 4.7  NEUTROABS 2.6  --  2.3  --   --   HGB 11.1*  < > 10.2* 10.2* 10.1*  HCT 34.5*  < > 32.0* 31.9* 31.5*  MCV 94.3  < > 92.8 91.7 90.8  PLT 154  < > 150 143* 146*  < > = values in this interval not displayed. Urinalysis:  Recent Labs Lab 02/03/14 1455  COLORURINE YELLOW  LABSPEC 1.013  PHURINE 6.0  GLUCOSEU NEGATIVE    HGBUR NEGATIVE  BILIRUBINUR NEGATIVE  KETONESUR NEGATIVE  PROTEINUR NEGATIVE  UROBILINOGEN 0.2  NITRITE NEGATIVE  LEUKOCYTESUR NEGATIVE    Micro Results: Recent Results (from the past 240 hour(s))  Culture, blood (routine x 2)     Status: None (Preliminary result)   Collection Time: 02/03/14  2:40 PM  Result Value Ref Range Status   Specimen Description BLOOD ARM LEFT  Final   Special Requests BOTTLES DRAWN AEROBIC AND ANAEROBIC 5CC  Final   Culture  Setup Time   Final    02/03/2014 20:36 Performed at [facility]    Culture   Final           BLOOD CULTURE RECEIVED NO GROWTH TO DATE CULTURE WILL BE HELD FOR 5 DAYS BEFORE ISSUING A FINAL NEGATIVE REPORT Performed at [facility]    Report Status PENDING  Incomplete  Culture, blood (routine x 2)     Status: None (Preliminary result)   Collection Time: 02/03/14  2:45 PM  Result Value Ref Range Status   Specimen Description BLOOD HAND RIGHT  Final   Special Requests BOTTLES DRAWN AEROBIC AND  ANAEROBIC 5CC  Final   Culture  Setup Time   Final    02/03/2014 20:39 Performed at [facility]    Culture   Final           BLOOD CULTURE RECEIVED NO GROWTH TO DATE CULTURE WILL BE HELD FOR 5 DAYS BEFORE ISSUING A FINAL NEGATIVE REPORT Performed at [facility]    Report Status PENDING  Incomplete  MRSA PCR Screening     Status: None   Collection Time: 02/03/14 11:58 PM  Result Value Ref Range Status   MRSA by PCR NEGATIVE NEGATIVE Final    Comment:        The GeneXpert MRSA Assay (FDA approved for NASAL specimens only), is one component of a comprehensive MRSA colonization surveillance program. It is not intended to diagnose MRSA infection nor to guide or monitor treatment for MRSA infections.    Studies/Results: No results found. Medications: I have reviewed the patient's current medications. Scheduled Meds: . acetaZOLAMIDE  1,000 mg Oral BID  . cefTAZidime (FORTAZ)  IV  2 g Intravenous  3 times per day  . docusate sodium  100 mg Oral Daily  . doxepin  30 mg Oral QHS  . pantoprazole  40 mg Oral Daily  . vancomycin  1,250 mg Intravenous Q12H   Continuous Infusions:  PRN Meds:.diazepam, HYDROmorphone, ketorolac, LORazepam, sodium chloride   Assessment/Plan: Headache 2/2 to Chronic Migraine vs CSF leak vs less likely bacterial meningitis: ESR 33. Procalcitonin <0.1 x 2.  Neurosurgery consulted and recommendations appreciated. - Continue Ceftazidime 2 g IV Q8H and Vancomycin 1 g IV Q12H - continue with toradol - CT myleogram today - Hold Lovenox prior  Pseudotumor Cerebri: Patient follows with Dr. Rexene Alberts at [facility]. She takes Diamox 1000 mg BID at home. She receives serial lumbar punctures. - Continue home Diamox  Depression/Anxiety: Patient takes Prozac 40 mg daily and Doxepin 30 mg QHS at home. - Continue home meds. Prozac held for CT myelogram. - Continue diazepam  Diet: Heart healthy DVT/PE PPx: SCDs  Dispo: Disposition is deferred at this time, awaiting improvement of current medical problems.  Anticipated discharge in approximately 1-2 day(s).   The patient does have a current PCP (Glenda Chroman, MD) and does not need an [facility] hospital follow-up appointment after discharge.  The patient does not have transportation limitations that hinder transportation to clinic appointments.  .Services Needed at time of discharge: Y = Yes, Blank = No PT:   OT:   RN:   Equipment:   Other:     LOS: 5 days   Jacques Earthly, MD 02/08/2014, 9:55 AM

## 2014-02-08 NOTE — Progress Notes (Signed)
Patient concerned about anxiety and procedure today. Patient needs to be NPO for procedure and can not take PO prn meds. MD stated she will place order.

## 2014-02-09 DIAGNOSIS — G971 Other reaction to spinal and lumbar puncture: Secondary | ICD-10-CM

## 2014-02-09 DIAGNOSIS — Y838 Other surgical procedures as the cause of abnormal reaction of the patient, or of later complication, without mention of misadventure at the time of the procedure: Secondary | ICD-10-CM

## 2014-02-09 LAB — CBC
HEMATOCRIT: 31.5 % — AB (ref 36.0–46.0)
Hemoglobin: 10.1 g/dL — ABNORMAL LOW (ref 12.0–15.0)
MCH: 28.9 pg (ref 26.0–34.0)
MCHC: 32.1 g/dL (ref 30.0–36.0)
MCV: 90.3 fL (ref 78.0–100.0)
Platelets: 158 10*3/uL (ref 150–400)
RBC: 3.49 MIL/uL — AB (ref 3.87–5.11)
RDW: 12.8 % (ref 11.5–15.5)
WBC: 4.6 10*3/uL (ref 4.0–10.5)

## 2014-02-09 LAB — CULTURE, BLOOD (ROUTINE X 2)
CULTURE: NO GROWTH
Culture: NO GROWTH

## 2014-02-09 LAB — BASIC METABOLIC PANEL
Anion gap: 12 (ref 5–15)
BUN: 12 mg/dL (ref 6–23)
CHLORIDE: 110 meq/L (ref 96–112)
CO2: 19 meq/L (ref 19–32)
CREATININE: 0.85 mg/dL (ref 0.50–1.10)
Calcium: 8.5 mg/dL (ref 8.4–10.5)
GFR calc Af Amer: >90 mL/min (ref >90)
GFR calc non Af Amer: 85 mL/min — ABNORMAL LOW (ref >90)
GLUCOSE: 89 mg/dL (ref 70–99)
POTASSIUM: 3.5 meq/L — AB (ref 3.7–5.3)
Sodium: 141 mEq/L (ref 137–147)

## 2014-02-09 MED ORDER — POTASSIUM CHLORIDE CRYS ER 20 MEQ PO TBCR
40.0000 meq | EXTENDED_RELEASE_TABLET | Freq: Once | ORAL | Status: AC
Start: 2014-02-09 — End: 2014-02-09
  Administered 2014-02-09: 40 meq via ORAL
  Filled 2014-02-09: qty 2

## 2014-02-09 MED ORDER — IBUPROFEN 800 MG PO TABS
800.0000 mg | ORAL_TABLET | Freq: Three times a day (TID) | ORAL | Status: DC | PRN
  Filled 2014-02-09: qty 1

## 2014-02-09 NOTE — Progress Notes (Signed)
Patient ID: Sandra [name], female   DOB: [birth year], 38 y.o.   MRN: [id number] Subjective:  The patient is alert and pleasant. Her headaches are better.  Objective: Vital signs in last 24 hours: Temp:  [98.3 F (36.8 C)-98.6 F (37 C)] 98.6 F (37 C) (12/17 0606) Pulse Rate:  [80] 80 (12/16 1527) Resp:  [13-18] 13 (12/17 0606) BP: (101-115)/(58-74) 108/71 mmHg (12/17 0606) SpO2:  [97 %-99 %] 97 % (12/17 0606)  Intake/Output from previous day: 12/16 0701 - 12/17 0700 In: 336 [P.O.:236; IV Piggyback:100] Out: 300 [Urine:300] Intake/Output this shift:    Physical exam the patient is alert and oriented 3. Her strength is normal in her lower extremities.  I have reviewed the patient's lumbar myelo CT and the CSF results. The patient has intracranial hypertension consistent with her known pseudotumor cerebri. There is no evidence of CSF fistula at the surgical site or meningitis.  Lab Results:  Recent Labs  02/08/14 0511 02/09/14 0500  WBC 4.7 4.6  HGB 10.1* 10.1*  HCT 31.5* 31.5*  PLT 146* 158   BMET  Recent Labs  02/08/14 0511 02/09/14 0500  NA 137 141  K 3.5* 3.5*  CL 107 110  CO2 21 19  GLUCOSE 86 89  BUN 11 12  CREATININE 0.84 0.85  CALCIUM 8.6 8.5    Studies/Results: Ct Lumbar Spine W Contrast  02/08/2014   CLINICAL DATA:  Headache. Previous laminectomy at L2-3. Question of CSF leak.  EXAM: LUMBAR MYELOGRAM  FLUOROSCOPY TIME:  0 min 42 seconds  PROCEDURE: After thorough discussion of risks and benefits of the procedure including bleeding, infection, injury to nerves, blood vessels, adjacent structures as well as headache and CSF leak, written and oral informed consent was obtained. Consent was obtained by Dr. Paulina FusiMark Shogry. Time out form was completed.  Patient was positioned prone on the fluoroscopy table. Local anesthesia was provided with 1% lidocaine without epinephrine after prepped and draped in the usual sterile fashion. Puncture was performed at L5-S1  using a 5 inch 20 gauge via right para median approach. Using a single pass through the dura, the needle was placed within the thecal sac, with return of clear CSF. Opening pressure was measured at 30 cm water. 15 cc Of Omnipaque-180 was injected into the thecal sac, with normal opacification of the nerve roots and cauda equina consistent with free flow within the subarachnoid space.  I personally performed the lumbar puncture and administered the intrathecal contrast. I also personally performed acquisition of the myelogram images.  TECHNIQUE: Contiguous axial images were obtained through the Lumbar spine after the intrathecal infusion of infusion. Coronal and sagittal reconstructions were obtained of the axial image sets.  COMPARISON:  MRI 02/03/2014  FINDINGS: LUMBAR MYELOGRAM FINDINGS:  There is mild curvature convex to the right. There is no severe stenosis or evidence of focal neural compression. There is a small anterior extradural defect at L4-5, with mild narrowing of both lateral recesses. There is no evidence of contrast leak age at the L2-3 level. Even on early films, 1 can see some contrast leakage into the epidural space at the site of needle placement at L5-S1. This is clearly the site of the epidural contrast.  CT LUMBAR MYELOGRAM FINDINGS:  T10-11 through L1-2 are normal. No disc pathology. No stenosis. The distal cord and conus are normal with the conus tip at upper L1.  L2-3: Previous left hemilaminectomy. No residual or recurrent disc herniation centrally. There may be some continued bulging of disc  in the foraminal to extra foraminal region on the left. No contrast leak at this level.  Below the L3 level, there is epidural contrast, more to the right of midline, with most of this being present at the L5-S1 level. This contrast relates to leakage from the 20 gauge needle puncture site. This would not be a pattern of distribution from leakage at the surgical site on the left at L2-3.  L3-4 disc  level: Bulging of the disc. Mild facet and ligamentous hypertrophy. No compressive stenosis.  L4-5: Broad-based herniation of disc material indents the thecal sac. Mild narrowing of both lateral recesses and.  L5-S1:  Normal interspace.  Sacroiliac joints are unremarkable.  IMPRESSION: Opening pressure somewhat elevated at 30 cm water.  No evidence of contrast leakage at the surgical site on the left at L2-3.  No suspicion of recurrent disc herniation at L2-3. Left foraminal to extra foraminal region does appear abnormal, consistent with discectomy in that region. I cannot rule out possibility of some recurrent disc but I do not specifically discern that.  Shallow broad based disc herniation at L4-5 indents the thecal sac with mild narrowing of both lateral recesses.   Electronically Signed   By: Paulina FusiMark  Shogry M.D.   On: 02/08/2014 13:07   Dg Myelography Lumbar Inj Lumbosacral  02/08/2014   CLINICAL DATA:  Headache. Previous laminectomy at L2-3. Question of CSF leak.  EXAM: LUMBAR MYELOGRAM  FLUOROSCOPY TIME:  0 min 42 seconds  PROCEDURE: After thorough discussion of risks and benefits of the procedure including bleeding, infection, injury to nerves, blood vessels, adjacent structures as well as headache and CSF leak, written and oral informed consent was obtained. Consent was obtained by Dr. Paulina FusiMark Shogry. Time out form was completed.  Patient was positioned prone on the fluoroscopy table. Local anesthesia was provided with 1% lidocaine without epinephrine after prepped and draped in the usual sterile fashion. Puncture was performed at L5-S1 using a 5 inch 20 gauge via right para median approach. Using a single pass through the dura, the needle was placed within the thecal sac, with return of clear CSF. Opening pressure was measured at 30 cm water. 15 cc Of Omnipaque-180 was injected into the thecal sac, with normal opacification of the nerve roots and cauda equina consistent with free flow within the subarachnoid  space.  I personally performed the lumbar puncture and administered the intrathecal contrast. I also personally performed acquisition of the myelogram images.  TECHNIQUE: Contiguous axial images were obtained through the Lumbar spine after the intrathecal infusion of infusion. Coronal and sagittal reconstructions were obtained of the axial image sets.  COMPARISON:  MRI 02/03/2014  FINDINGS: LUMBAR MYELOGRAM FINDINGS:  There is mild curvature convex to the right. There is no severe stenosis or evidence of focal neural compression. There is a small anterior extradural defect at L4-5, with mild narrowing of both lateral recesses. There is no evidence of contrast leak age at the L2-3 level. Even on early films, 1 can see some contrast leakage into the epidural space at the site of needle placement at L5-S1. This is clearly the site of the epidural contrast.  CT LUMBAR MYELOGRAM FINDINGS:  T10-11 through L1-2 are normal. No disc pathology. No stenosis. The distal cord and conus are normal with the conus tip at upper L1.  L2-3: Previous left hemilaminectomy. No residual or recurrent disc herniation centrally. There may be some continued bulging of disc in the foraminal to extra foraminal region on the left. No contrast  leak at this level.  Below the L3 level, there is epidural contrast, more to the right of midline, with most of this being present at the L5-S1 level. This contrast relates to leakage from the 20 gauge needle puncture site. This would not be a pattern of distribution from leakage at the surgical site on the left at L2-3.  L3-4 disc level: Bulging of the disc. Mild facet and ligamentous hypertrophy. No compressive stenosis.  L4-5: Broad-based herniation of disc material indents the thecal sac. Mild narrowing of both lateral recesses and.  L5-S1:  Normal interspace.  Sacroiliac joints are unremarkable.  IMPRESSION: Opening pressure somewhat elevated at 30 cm water.  No evidence of contrast leakage at the  surgical site on the left at L2-3.  No suspicion of recurrent disc herniation at L2-3. Left foraminal to extra foraminal region does appear abnormal, consistent with discectomy in that region. I cannot rule out possibility of some recurrent disc but I do not specifically discern that.  Shallow broad based disc herniation at L4-5 indents the thecal sac with mild narrowing of both lateral recesses.   Electronically Signed   By: Paulina Fusi M.D.   On: 02/08/2014 13:07    Assessment/Plan: Headaches: It appears her headaches are likely secondary to her pseudotumor cerebri and/or migraines. The myelo CT and CSF studies demonstrate no evidence of CSF leak at the surgical site or meningitis. I have discussed the situation with the patient. I have recommended she follow-up with be in the office in a month or 2 or a routine postop check. I recommend she follow-up with her headache neurologist for her headaches. I have answered all her questions. She can be discharged from my point of view.  LOS: 6 days     Bryar Rennie D 02/09/2014, 3:05 PM

## 2014-02-09 NOTE — Progress Notes (Deleted)
Subjective: Headache continues to be improved. Complaining of back pain around the site of her procedure yesterday. Also some aching from left thigh down to the calf. Her nausea has also remained stable but no emesis. Reports some abdominal pain that is similar to her chronic abdominal pain associated with IBD. Denies CP, SOB. No BM yet  Objective: Vital signs in last 24 hours: Filed Vitals:   02/08/14 1527 02/08/14 1748 02/08/14 2210 02/09/14 0606  BP: 107/65 115/74 101/58 108/71  Pulse: 80     Temp: 98.3 F (36.8 C) 98.5 F (36.9 C) 98.6 F (37 C) 98.6 F (37 C)  TempSrc: Oral Oral Oral Oral  Resp: 14 18 17 13   Height:      Weight:      SpO2: 97% 99% 99% 97%   Weight change:   Intake/Output Summary (Last 24 hours) at 02/09/14 1013 Last data filed at 02/08/14 2233  Gross per 24 hour  Intake    336 ml  Output      0 ml  Net    336 ml   General: alert in NAD HEENT: [place]/AT, EOMI CV: RRR, normal S1/S2, no m/g/r Pulm: CTA bilaterally, breaths non-labored Abd: BS+, soft, non-tender Ext: warm, no edema, moves all Neuro: alert and oriented x 3, CNs III-XII intact, 5/5 strength B/L UE and LE. Sensation intact bilaterally.  Skin: No rashes present. Small surgical incision on lower back appears clean, dry, intact, no erythema or drainage. Appropriately tender to palpation.  Lab Results: Basic Metabolic Panel:  Recent Labs Lab 02/08/14 0511 02/09/14 0500  NA 137 141  K 3.5* 3.5*  CL 107 110  CO2 21 19  GLUCOSE 86 89  BUN 11 12  CREATININE 0.84 0.85  CALCIUM 8.6 8.5   Liver Function Tests:  Recent Labs Lab 02/04/14 0447  AST 17  ALT 15  ALKPHOS 101  BILITOT <0.2*  PROT 5.9*  ALBUMIN 2.7*   CBC:  Recent Labs Lab 02/04/14 0447  02/06/14 0557  02/08/14 0511 02/09/14 0500  WBC 4.4  < > 4.4  < > 4.7 4.6  NEUTROABS 2.6  --  2.3  --   --   --   HGB 11.1*  < > 10.2*  < > 10.1* 10.1*  HCT 34.5*  < > 32.0*  < > 31.5* 31.5*  MCV 94.3  < > 92.8  < > 90.8 90.3    PLT 154  < > 150  < > 146* 158  < > = values in this interval not displayed. Urinalysis:  Recent Labs Lab 02/03/14 1455  COLORURINE YELLOW  LABSPEC 1.013  PHURINE 6.0  GLUCOSEU NEGATIVE  HGBUR NEGATIVE  BILIRUBINUR NEGATIVE  KETONESUR NEGATIVE  PROTEINUR NEGATIVE  UROBILINOGEN 0.2  NITRITE NEGATIVE  LEUKOCYTESUR NEGATIVE    Micro Results: Recent Results (from the past 240 hour(s))  Culture, blood (routine x 2)     Status: None   Collection Time: 02/03/14  2:40 PM  Result Value Ref Range Status   Specimen Description BLOOD ARM LEFT  Final   Special Requests BOTTLES DRAWN AEROBIC AND ANAEROBIC 5CC  Final   Culture  Setup Time   Final    02/03/2014 20:36 Performed at [facility]    Culture   Final    NO GROWTH 5 DAYS Performed at [facility]    Report Status 02/09/2014 FINAL  Final  Culture, blood (routine x 2)     Status: None   Collection Time: 02/03/14  2:45 PM  Result Value Ref Range Status   Specimen Description BLOOD HAND RIGHT  Final   Special Requests BOTTLES DRAWN AEROBIC AND ANAEROBIC 5CC  Final   Culture  Setup Time   Final    02/03/2014 20:39 [facility] at [facility]    Culture   Final    NO GROWTH 5 DAYS [facility]formed at [facility]    Report Status 02/09/2014 FINAL  Final  MRSA PCR Screening     Status: None   Collection Time: 02/03/14 11:58 PM  Result Value Ref Range Status   MRSA by PCR NEGATIVE NEGATIVE Final    Comment:        The GeneXpert MRSA Assay (FDA approved for NASAL specimens only), is one component of a comprehensive MRSA colonization surveillance program. It is not intended to diagnose MRSA infection nor to guide or monitor treatment for MRSA infections.   CSF culture     Status: None (Preliminary result)   Collection Time: 02/08/14 12:15 PM  Result Value Ref Range Status   Specimen Description CSF  Final   Special Requests NONE  Final   Gram Stain   Final    WBC PRESENT,BOTH PMN AND  MONONUCLEAR NO ORGANISMS SEEN CYTOSPIN [facility] at [facility]    Culture PENDING  Incomplete   Report Status PENDING  Incomplete  Gram stain     Status: None   Collection Time: 02/08/14 12:15 PM  Result Value Ref Range Status   Specimen Description CSF  Final   Special Requests NONE  Final   Gram Stain   Final    CYTOSPIN PREP WBC PRESENT, PREDOMINANTLY MONONUCLEAR NO ORGANISMS SEEN    Report Status 02/08/2014 FINAL  Final   Studies/Results: Ct Lumbar Spine W Contrast  02/08/2014   CLINICAL DATA:  Headache. Previous laminectomy at L2-3. Question of CSF leak.  EXAM: LUMBAR MYELOGRAM  FLUOROSCOPY TIME:  0 min 42 seconds  PROCEDURE: After thorough discussion of risks and benefits of the procedure including bleeding, infection, injury to nerves, blood vessels, adjacent structures as well as headache and CSF leak, written and oral informed consent was obtained. Consent was obtained by Dr. Paulina FusiMark Shogry. Time out form was completed.  Patient was positioned prone on the fluoroscopy table. Local anesthesia was provided with 1% lidocaine without epinephrine after prepped and draped in the usual sterile fashion. Puncture was performed at L5-S1 using a 5 inch 20 gauge via right para median approach. Using a single pass through the dura, the needle was placed within the thecal sac, with return of clear CSF. Opening pressure was measured at 30 cm water. 15 cc Of Omnipaque-180 was injected into the thecal sac, with normal opacification of the nerve roots and cauda equina consistent with free flow within the subarachnoid space.  I personally performed the lumbar puncture and administered the intrathecal contrast. I also personally performed acquisition of the myelogram images.  TECHNIQUE: Contiguous axial images were obtained through the Lumbar spine after the intrathecal infusion of infusion. Coronal and sagittal reconstructions were obtained of the axial image sets.   COMPARISON:  MRI 02/03/2014  FINDINGS: LUMBAR MYELOGRAM FINDINGS:  There is mild curvature convex to the right. There is no severe stenosis or evidence of focal neural compression. There is a small anterior extradural defect at L4-5, with mild narrowing of both lateral recesses. There is no evidence of contrast leak age at the L2-3 level. Even on early films, 1 can see  some contrast leakage into the epidural space at the site of needle placement at L5-S1. This is clearly the site of the epidural contrast.  CT LUMBAR MYELOGRAM FINDINGS:  T10-11 through L1-2 are normal. No disc pathology. No stenosis. The distal cord and conus are normal with the conus tip at upper L1.  L2-3: Previous left hemilaminectomy. No residual or recurrent disc herniation centrally. There may be some continued bulging of disc in the foraminal to extra foraminal region on the left. No contrast leak at this level.  Below the L3 level, there is epidural contrast, more to the right of midline, with most of this being present at the L5-S1 level. This contrast relates to leakage from the 20 gauge needle puncture site. This would not be a pattern of distribution from leakage at the surgical site on the left at L2-3.  L3-4 disc level: Bulging of the disc. Mild facet and ligamentous hypertrophy. No compressive stenosis.  L4-5: Broad-based herniation of disc material indents the thecal sac. Mild narrowing of both lateral recesses and.  L5-S1:  Normal interspace.  Sacroiliac joints are unremarkable.  IMPRESSION: Opening pressure somewhat elevated at 30 cm water.  No evidence of contrast leakage at the surgical site on the left at L2-3.  No suspicion of recurrent disc herniation at L2-3. Left foraminal to extra foraminal region does appear abnormal, consistent with discectomy in that region. I cannot rule out possibility of some recurrent disc but I do not specifically discern that.  Shallow broad based disc herniation at L4-5 indents the thecal sac with  mild narrowing of both lateral recesses.   Electronically Signed   By: Paulina Fusi M.D.   On: 02/08/2014 13:07   Dg Myelography Lumbar Inj Lumbosacral  02/08/2014   CLINICAL DATA:  Headache. Previous laminectomy at L2-3. Question of CSF leak.  EXAM: LUMBAR MYELOGRAM  FLUOROSCOPY TIME:  0 min 42 seconds  PROCEDURE: After thorough discussion of risks and benefits of the procedure including bleeding, infection, injury to nerves, blood vessels, adjacent structures as well as headache and CSF leak, written and oral informed consent was obtained. Consent was obtained by Dr. Paulina Fusi. Time out form was completed.  Patient was positioned prone on the fluoroscopy table. Local anesthesia was provided with 1% lidocaine without epinephrine after prepped and draped in the usual sterile fashion. Puncture was performed at L5-S1 using a 5 inch 20 gauge via right para median approach. Using a single pass through the dura, the needle was placed within the thecal sac, with return of clear CSF. Opening pressure was measured at 30 cm water. 15 cc Of Omnipaque-180 was injected into the thecal sac, with normal opacification of the nerve roots and cauda equina consistent with free flow within the subarachnoid space.  I personally performed the lumbar puncture and administered the intrathecal contrast. I also personally performed acquisition of the myelogram images.  TECHNIQUE: Contiguous axial images were obtained through the Lumbar spine after the intrathecal infusion of infusion. Coronal and sagittal reconstructions were obtained of the axial image sets.  COMPARISON:  MRI 02/03/2014  FINDINGS: LUMBAR MYELOGRAM FINDINGS:  There is mild curvature convex to the right. There is no severe stenosis or evidence of focal neural compression. There is a small anterior extradural defect at L4-5, with mild narrowing of both lateral recesses. There is no evidence of contrast leak age at the L2-3 level. Even on early films, 1 can see some  contrast leakage into the epidural space at the site of needle  placement at L5-S1. This is clearly the site of the epidural contrast.  CT LUMBAR MYELOGRAM FINDINGS:  T10-11 through L1-2 are normal. No disc pathology. No stenosis. The distal cord and conus are normal with the conus tip at upper L1.  L2-3: Previous left hemilaminectomy. No residual or recurrent disc herniation centrally. There may be some continued bulging of disc in the foraminal to extra foraminal region on the left. No contrast leak at this level.  Below the L3 level, there is epidural contrast, more to the right of midline, with most of this being present at the L5-S1 level. This contrast relates to leakage from the 20 gauge needle puncture site. This would not be a pattern of distribution from leakage at the surgical site on the left at L2-3.  L3-4 disc level: Bulging of the disc. Mild facet and ligamentous hypertrophy. No compressive stenosis.  L4-5: Broad-based herniation of disc material indents the thecal sac. Mild narrowing of both lateral recesses and.  L5-S1:  Normal interspace.  Sacroiliac joints are unremarkable.  IMPRESSION: Opening pressure somewhat elevated at 30 cm water.  No evidence of contrast leakage at the surgical site on the left at L2-3.  No suspicion of recurrent disc herniation at L2-3. Left foraminal to extra foraminal region does appear abnormal, consistent with discectomy in that region. I cannot rule out possibility of some recurrent disc but I do not specifically discern that.  Shallow broad based disc herniation at L4-5 indents the thecal sac with mild narrowing of both lateral recesses.   Electronically Signed   By: Paulina FusiMark  Shogry M.D.   On: 02/08/2014 13:07   Medications: I have reviewed the patient's current medications. Scheduled Meds: . acetaZOLAMIDE  1,000 mg Oral BID  . docusate sodium  100 mg Oral Daily  . doxepin  30 mg Oral QHS  . pantoprazole  40 mg Oral Daily   Continuous Infusions:  PRN  Meds:.diazepam, HYDROmorphone, ketorolac, ondansetron (ZOFRAN) IV, sodium chloride   Assessment/Plan: Headache 2/2 to Chronic Migraine vs Pseudotumor cerebri: No leak noted on myelogram, unlikely to be meningitis as glucose, protein and WBC wnl and no organisms to date on cx. Mildly increased opening pressure. Neurosurgery consulted and recommendations appreciated. - Discontinued Ceftazidime 2 g IV Q8H and Vancomycin 1 g IV Q12H - Discharge pending recommendations from neurosurgery.  - d/c toradol and switch to home ibuprofen  Pseudotumor Cerebri: Patient [facility]. She takes Diamox 1000 mg BID at home. She receives serial lumbar punctures. Elevated opening pressure of 30 from yesterday.  - Continue home Diamox  Depression/Anxiety: Patient takes Prozac 40 mg daily and Doxepin 30 mg QHS at home. - Continue home meds. - Continue diazepam  Diet: Heart healthy DVT/PE PPx: SCDs  Dispo: Likely home today  The patient does have a current PCP (Ignatius Speckinghruv B. Vyas, MD) and [facility] not [facility] after discharge.  The patient does not have transportation limitations that hinder transportation to clinic appointments.  .Services Needed at time of discharge: Y = Yes, Blank = No PT:   OT:   RN:   Equipment:   Other:     LOS: 6 days   Griffin BasilJennifer Pernell Lenoir, MD 02/09/2014, 10:13 AM

## 2014-02-09 NOTE — Progress Notes (Signed)
Patient discharge teaching given, including activity, diet, follow-up appoints, and medications. Patient verbalized understanding of all discharge instructions. IV access was d/c'd. Vitals are stable. Skin is intact except as charted in most recent assessments. Pt to be escorted out by RN, to be driven home by family.  

## 2014-02-09 NOTE — Progress Notes (Signed)
Physical Therapy Treatment Patient Details Name: Sandra [name] MRN: [id number] DOB: 11-Jan-1976 Today's Date: 02/09/2014    History of Present Illness : [name] is a 38yo woman w/ PMHx of pseudotumor cerebri, chronic migraines, vertigo, postural orthostatic tachycardia syndrome, congenital lumbar stenosis, and recent back surgery (lumbar laminectomy/decompression) on 01/11/14 who presents to the ED with a 2-3 day history of headache and fever.    PT Comments    Approaching baseline functioning, no further PT needs.  Will sign off.  Follow Up Recommendations  No PT follow up;Supervision/Assistance - 24 hour     Equipment Recommendations  None recommended by PT    Recommendations for Other Services       Precautions / Restrictions      Mobility  Bed Mobility Overal bed mobility: Independent                Transfers Overall transfer level: Independent                  Ambulation/Gait Ambulation/Gait assistance: Independent;Modified independent (Device/Increase time) Ambulation Distance (Feet): 300 Feet Assistive device: None (iv pole) Gait Pattern/deviations: WFL(Within Functional Limits) Gait velocity: functional Gait velocity interpretation: at or above normal speed for age/gender General Gait Details: steady with or without iv pole, but preferred to push pole due to feeling weak.   Stairs Stairs: Yes Stairs assistance: Modified independent (Device/Increase time) Stair Management: One rail Right;Alternating pattern;Forwards Number of Stairs: 3 (limited by iv lines) General stair comments: safe with rail  Wheelchair Mobility    Modified Rankin (Stroke Patients Only)       Balance Overall balance assessment: No apparent balance deficits (not formally assessed)   Sitting balance-Leahy Scale: Good       Standing balance-Leahy Scale: Good                      Cognition Arousal/Alertness: Awake/alert Behavior During Therapy: WFL  for tasks assessed/performed Overall Cognitive Status: Within Functional Limits for tasks assessed                      Exercises      General Comments General comments (skin integrity, edema, etc.): reinforced back care/precautions, sitting up from sidelying, lifting restrictions, and progression of activtiy.      Pertinent Vitals/Pain Pain Assessment: Faces Faces Pain Scale: Hurts a little bit Pain Location: HA Pain Descriptors / Indicators: Aching Pain Intervention(s): Other (comment) (much improved)    Home Living                      Prior Function            PT Goals (current goals can now be found in the care plan section) Acute Rehab PT Goals PT Goal Formulation: With patient Time For Goal Achievement: 02/18/14 Potential to Achieve Goals: Good Progress towards PT goals: Progressing toward goals    Frequency  Min 3X/week    PT Plan Current plan remains appropriate    Co-evaluation             End of Session   Activity Tolerance: Patient tolerated treatment well Patient left: in bed;Other (comment);with call bell/phone within reach (sitting EOB)     Time: [id number] PT Time Calculation (min) (ACUTE ONLY): 14 min  Charges:  $Gait Training: 8-22 mins                    G Codes:  Dicie Edelen, Eliseo GumKenneth V 02/09/2014, 4:31 PM 02/09/2014  Benicia BingKen Zakyla Tonche, PT [phone]  (pager)

## 2014-02-09 NOTE — Progress Notes (Signed)
Per MD order, PICC line removed. Cath intact at 42cm. Vaseline pressure gauze to site, pressure held x 5min. No bleeding to site. Pt instructed to keep dressing CDI x 24 hours. Avoid heavy lifting, pushing or pulling x 24 hours,  If bleeding occurs hold pressure, if bleeding does not stop contact MD or go to the ED. Pt does not have any questions. Zeanna Sunde M 

## 2014-02-09 NOTE — Progress Notes (Signed)
Subjective: Headache continues to be improved. Complaining of back pain around the site of her procedure yesterday. Also some aching from left thigh down to the calf. Her nausea has also remained stable but no emesis. Reports some abdominal pain that is similar to her chronic abdominal pain associated with IBD. Denies CP, SOB. No BM yet  Objective: Vital signs in last 24 hours: Filed Vitals:   02/08/14 1527 02/08/14 1748 02/08/14 2210 02/09/14 0606  BP: 107/65 115/74 101/58 108/71  Pulse: 80     Temp: 98.3 F (36.8 C) 98.5 F (36.9 C) 98.6 F (37 C) 98.6 F (37 C)  TempSrc: Oral Oral Oral Oral  Resp: 14 18 17 13   Height:      Weight:      SpO2: 97% 99% 99% 97%   Weight change:   Intake/Output Summary (Last 24 hours) at 02/09/14 1241 Last data filed at 02/08/14 2233  Gross per 24 hour  Intake    336 ml  Output      0 ml  Net    336 ml   General: alert in NAD HEENT: [place]/AT, EOMI CV: RRR, normal S1/S2, no m/g/r Pulm: CTA bilaterally, breaths non-labored Abd: BS+, soft, non-tender Ext: warm, no edema, moves all Neuro: alert and oriented x 3, CNs III-XII intact, 5/5 strength B/L UE and LE. Sensation intact bilaterally.  Skin: No rashes present. Small surgical incision on lower back appears clean, dry, intact, no erythema or drainage. Appropriately tender to palpation.  Lab Results: Basic Metabolic Panel:  Recent Labs Lab 02/08/14 0511 02/09/14 0500  NA 137 141  K 3.5* 3.5*  CL 107 110  CO2 21 19  GLUCOSE 86 89  BUN 11 12  CREATININE 0.84 0.85  CALCIUM 8.6 8.5   Liver Function Tests:  Recent Labs Lab 02/04/14 0447  AST 17  ALT 15  ALKPHOS 101  BILITOT <0.2*  PROT 5.9*  ALBUMIN 2.7*   CBC:  Recent Labs Lab 02/04/14 0447  02/06/14 0557  02/08/14 0511 02/09/14 0500  WBC 4.4  < > 4.4  < > 4.7 4.6  NEUTROABS 2.6  --  2.3  --   --   --   HGB 11.1*  < > 10.2*  < > 10.1* 10.1*  HCT 34.5*  < > 32.0*  < > 31.5* 31.5*  MCV 94.3  < > 92.8  < > 90.8 90.3    PLT 154  < > 150  < > 146* 158  < > = values in this interval not displayed. Urinalysis:  Recent Labs Lab 02/03/14 1455  COLORURINE YELLOW  LABSPEC 1.013  PHURINE 6.0  GLUCOSEU NEGATIVE  HGBUR NEGATIVE  BILIRUBINUR NEGATIVE  KETONESUR NEGATIVE  PROTEINUR NEGATIVE  UROBILINOGEN 0.2  NITRITE NEGATIVE  LEUKOCYTESUR NEGATIVE    Micro Results: Recent Results (from the past 240 hour(s))  Culture, blood (routine x 2)     Status: None   Collection Time: 02/03/14  2:40 PM  Result Value Ref Range Status   Specimen Description BLOOD ARM LEFT  Final   Special Requests BOTTLES DRAWN AEROBIC AND ANAEROBIC 5CC  Final   Culture  Setup Time   Final    02/03/2014 20:36 Performed at [facility]    Culture   Final    NO GROWTH 5 DAYS Performed at [facility]    Report Status 02/09/2014 FINAL  Final  Culture, blood (routine x 2)     Status: None   Collection Time: 02/03/14  2:45 PM  Result Value Ref Range Status   Specimen Description BLOOD HAND RIGHT  Final   Special Requests BOTTLES DRAWN AEROBIC AND ANAEROBIC 5CC  Final   Culture  Setup Time   Final    02/03/2014 20:39 Performed at [facility]    Culture   Final    NO GROWTH 5 DAYS Performed at [facility]    Report Status 02/09/2014 FINAL  Final  MRSA PCR Screening     Status: None   Collection Time: 02/03/14 11:58 PM  Result Value Ref Range Status   MRSA by PCR NEGATIVE NEGATIVE Final    Comment:        The GeneXpert MRSA Assay (FDA approved for NASAL specimens only), is one component of a comprehensive MRSA colonization surveillance program. It is not intended to diagnose MRSA infection nor to guide or monitor treatment for MRSA infections.   CSF culture     Status: None (Preliminary result)   Collection Time: 02/08/14 12:15 PM  Result Value Ref Range Status   Specimen Description CSF  Final   Special Requests NONE  Final   Gram Stain   Final    WBC PRESENT,BOTH PMN AND  MONONUCLEAR NO ORGANISMS SEEN CYTOSPIN [facility]formed at [facility]    Culture PENDING  Incomplete   Report Status PENDING  Incomplete  Gram stain     Status: None   Collection Time: 02/08/14 12:15 PM  Result Value Ref Range Status   Specimen Description CSF  Final   Special Requests NONE  Final   Gram Stain   Final    CYTOSPIN PREP WBC PRESENT, PREDOMINANTLY MONONUCLEAR NO ORGANISMS SEEN    Report Status 02/08/2014 FINAL  Final   Studies/Results: Ct Lumbar Spine W Contrast  02/08/2014   CLINICAL DATA:  Headache. Previous laminectomy at L2-3. Question of CSF leak.  EXAM: LUMBAR MYELOGRAM  FLUOROSCOPY TIME:  0 min 42 seconds  PROCEDURE: After thorough discussion of risks and benefits of the procedure including bleeding, infection, injury to nerves, blood vessels, adjacent structures as well as headache and CSF leak, written and oral informed consent was obtained. Consent was obtained by Dr. Paulina FusiMark Shogry. Time out form was completed.  Patient was positioned prone on the fluoroscopy table. Local anesthesia was provided with 1% lidocaine without epinephrine after prepped and draped in the usual sterile fashion. Puncture was performed at L5-S1 using a 5 inch 20 gauge via right para median approach. Using a single pass through the dura, the needle was placed within the thecal sac, with return of clear CSF. Opening pressure was measured at 30 cm water. 15 cc Of Omnipaque-180 was injected into the thecal sac, with normal opacification of the nerve roots and cauda equina consistent with free flow within the subarachnoid space.  I personally performed the lumbar puncture and administered the intrathecal contrast. I also personally performed acquisition of the myelogram images.  TECHNIQUE: Contiguous axial images were obtained through the Lumbar spine after the intrathecal infusion of infusion. Coronal and sagittal reconstructions were obtained of the axial image sets.   COMPARISON:  MRI 02/03/2014  FINDINGS: LUMBAR MYELOGRAM FINDINGS:  There is mild curvature convex to the right. There is no severe stenosis or evidence of focal neural compression. There is a small anterior extradural defect at L4-5, with mild narrowing of both lateral recesses. There is no evidence of contrast leak age at the L2-3 level. Even on early films, 1 can see  some contrast leakage into the epidural space at the site of needle placement at L5-S1. This is clearly the site of the epidural contrast.  CT LUMBAR MYELOGRAM FINDINGS:  T10-11 through L1-2 are normal. No disc pathology. No stenosis. The distal cord and conus are normal with the conus tip at upper L1.  L2-3: Previous left hemilaminectomy. No residual or recurrent disc herniation centrally. There may be some continued bulging of disc in the foraminal to extra foraminal region on the left. No contrast leak at this level.  Below the L3 level, there is epidural contrast, more to the right of midline, with most of this being present at the L5-S1 level. This contrast relates to leakage from the 20 gauge needle puncture site. This would not be a pattern of distribution from leakage at the surgical site on the left at L2-3.  L3-4 disc level: Bulging of the disc. Mild facet and ligamentous hypertrophy. No compressive stenosis.  L4-5: Broad-based herniation of disc material indents the thecal sac. Mild narrowing of both lateral recesses and.  L5-S1:  Normal interspace.  Sacroiliac joints are unremarkable.  IMPRESSION: Opening pressure somewhat elevated at 30 cm water.  No evidence of contrast leakage at the surgical site on the left at L2-3.  No suspicion of recurrent disc herniation at L2-3. Left foraminal to extra foraminal region does appear abnormal, consistent with discectomy in that region. I cannot rule out possibility of some recurrent disc but I do not specifically discern that.  Shallow broad based disc herniation at L4-5 indents the thecal sac with  mild narrowing of both lateral recesses.   Electronically Signed   By: Paulina Fusi M.D.   On: 02/08/2014 13:07   Dg Myelography Lumbar Inj Lumbosacral  02/08/2014   CLINICAL DATA:  Headache. Previous laminectomy at L2-3. Question of CSF leak.  EXAM: LUMBAR MYELOGRAM  FLUOROSCOPY TIME:  0 min 42 seconds  PROCEDURE: After thorough discussion of risks and benefits of the procedure including bleeding, infection, injury to nerves, blood vessels, adjacent structures as well as headache and CSF leak, written and oral informed consent was obtained. Consent was obtained by Dr. Paulina Fusi. Time out form was completed.  Patient was positioned prone on the fluoroscopy table. Local anesthesia was provided with 1% lidocaine without epinephrine after prepped and draped in the usual sterile fashion. Puncture was performed at L5-S1 using a 5 inch 20 gauge via right para median approach. Using a single pass through the dura, the needle was placed within the thecal sac, with return of clear CSF. Opening pressure was measured at 30 cm water. 15 cc Of Omnipaque-180 was injected into the thecal sac, with normal opacification of the nerve roots and cauda equina consistent with free flow within the subarachnoid space.  I personally performed the lumbar puncture and administered the intrathecal contrast. I also personally performed acquisition of the myelogram images.  TECHNIQUE: Contiguous axial images were obtained through the Lumbar spine after the intrathecal infusion of infusion. Coronal and sagittal reconstructions were obtained of the axial image sets.  COMPARISON:  MRI 02/03/2014  FINDINGS: LUMBAR MYELOGRAM FINDINGS:  There is mild curvature convex to the right. There is no severe stenosis or evidence of focal neural compression. There is a small anterior extradural defect at L4-5, with mild narrowing of both lateral recesses. There is no evidence of contrast leak age at the L2-3 level. Even on early films, 1 can see some  contrast leakage into the epidural space at the site of needle  placement at L5-S1. This is clearly the site of the epidural contrast.  CT LUMBAR MYELOGRAM FINDINGS:  T10-11 through L1-2 are normal. No disc pathology. No stenosis. The distal cord and conus are normal with the conus tip at upper L1.  L2-3: Previous left hemilaminectomy. No residual or recurrent disc herniation centrally. There may be some continued bulging of disc in the foraminal to extra foraminal region on the left. No contrast leak at this level.  Below the L3 level, there is epidural contrast, more to the right of midline, with most of this being present at the L5-S1 level. This contrast relates to leakage from the 20 gauge needle puncture site. This would not be a pattern of distribution from leakage at the surgical site on the left at L2-3.  L3-4 disc level: Bulging of the disc. Mild facet and ligamentous hypertrophy. No compressive stenosis.  L4-5: Broad-based herniation of disc material indents the thecal sac. Mild narrowing of both lateral recesses and.  L5-S1:  Normal interspace.  Sacroiliac joints are unremarkable.  IMPRESSION: Opening pressure somewhat elevated at 30 cm water.  No evidence of contrast leakage at the surgical site on the left at L2-3.  No suspicion of recurrent disc herniation at L2-3. Left foraminal to extra foraminal region does appear abnormal, consistent with discectomy in that region. I cannot rule out possibility of some recurrent disc but I do not specifically discern that.  Shallow broad based disc herniation at L4-5 indents the thecal sac with mild narrowing of both lateral recesses.   Electronically Signed   By: Paulina FusiMark  Shogry M.D.   On: 02/08/2014 13:07   Medications: I have reviewed the patient's current medications. Scheduled Meds: . acetaZOLAMIDE  1,000 mg Oral BID  . docusate sodium  100 mg Oral Daily  . doxepin  30 mg Oral QHS  . pantoprazole  40 mg Oral Daily   Continuous Infusions:  PRN  Meds:.diazepam, HYDROmorphone, ibuprofen, ondansetron (ZOFRAN) IV, sodium chloride   Assessment/Plan: Headache 2/2 to Chronic Migraine vs Pseudotumor cerebri: No leak noted on myelogram, unlikely to be meningitis as glucose, protein and WBC wnl and no organisms to date on cx. Mildly increased opening pressure. Neurosurgery consulted and recommendations appreciated. - Discontinued Ceftazidime 2 g IV Q8H and Vancomycin 1 g IV Q12H - Discharge pending recommendations from neurosurgery.  - d/c toradol and switch to home ibuprofen  Pseudotumor Cerebri: [facility]. She takes Diamox 1000 mg BID at home. She receives serial lumbar punctures. Elevated opening pressure of 30 from yesterday.  - Continue home Diamox  Depression/Anxiety: Patient takes Prozac 40 mg daily and Doxepin 30 mg QHS at home. - Continue home meds. - Continue diazepam  Diet: Heart healthy DVT/PE PPx: SCDs  Dispo: Likely home today  The patient does have a current PCP (Ignatius Speckinghruv B. Vyas, MD) and [facility] after discharge.  The patient does not have transportation limitations that hinder transportation to clinic appointments.  .Services Needed at time of discharge: Y = Yes, Blank = No PT:   OT:   RN:   Equipment:   Other:     LOS: 6 days   Griffin BasilJennifer Daziya Redmond, MD 02/09/2014, 12:41 PM

## 2014-02-11 NOTE — Discharge Summary (Signed)
Name: Sandra [name] MRN: [id number] DOB: Jul 20, 1975 38 y.o. PCP: Glenda Chroman, MD  Date of Admission: 02/03/2014  1:52 PM Date of Discharge: 02/09/2014 Attending Physician: Aldine Contes, MD  Discharge Diagnosis: Principal Problem:   Headache, spinal, postoperative Active Problems:   Morbid obesity   Lumbar herniated disc   Pseudotumor cerebri   Possible Bacterial meningitis  Discharge Medications:   Medication List    TAKE these medications        acetaZOLAMIDE 500 MG capsule  Commonly known as:  DIAMOX  Take 2 capsules (1,000 mg total) by mouth 2 (two) times daily. 1 tab (583m) in the morning and 2 tabs (100102m nightly at bedtime for 1 week, then 2 tabs (100061mtwice daily thereafter.     diazepam 5 MG tablet  Commonly known as:  VALIUM  Take 1 tablet (5 mg total) by mouth every 6 (six) hours as needed for muscle spasms.     doxepin 10 MG capsule  Commonly known as:  SINEQUAN  Take 30 mg by mouth at bedtime.     DSS 100 MG Caps  Take 100 mg by mouth 2 (two) times daily.     FLUoxetine 40 MG capsule  Commonly known as:  PROZAC  Take 40 mg by mouth daily.     HYDROmorphone 4 MG tablet  Commonly known as:  DILAUDID  Take 1 tablet (4 mg total) by mouth every 4 (four) hours as needed for severe pain.     ibuprofen 800 MG tablet  Commonly known as:  ADVIL,MOTRIN  Take 1 tablet (800 mg total) by mouth every 8 (eight) hours as needed.     LINZESS 145 MCG Caps capsule  Generic drug:  Linaclotide  Take 145 mcg by mouth daily as needed (for constipation).     meclizine 25 MG tablet  Commonly known as:  ANTIVERT  Take 25 mg by mouth daily as needed for dizziness.     OVER THE COUNTER MEDICATION  Take 1 tablet by mouth daily. Stool softener     pantoprazole 40 MG tablet  Commonly known as:  PROTONIX  Take 40 mg by mouth daily.     promethazine 25 MG tablet  Commonly known as:  PHENERGAN  Take 1 tablet (25 mg total) by mouth every 8 (eight) hours as  needed for nausea or vomiting.     rizatriptan 5 MG disintegrating tablet  Commonly known as:  MAXALT-MLT  Take 5 mg by mouth daily as needed for migraine. May repeat in 2 hours if needed        Disposition and follow-up:   Sandra [name] discharged from [facility] Stable condition.  At the hospital follow up visit please address:  1.  Headache: chronic migraine vs pseudotumor cerebri. She was set up with follow up with her neurologist.  2.  Labs / imaging needed at time of follow-up: None  3.  Pending labs/ test needing follow-up: CSF culture  Follow-up Appointments:     Follow-up Information    Follow up with JENOphelia CharterD.   Specialty:  Neurosurgery   Why:  for post op follow up. Please call the office to schedule this appointment.   Contact information:   [address] [place] [phone]    Follow up with ATHStar AgeD On 02/13/2014.   Specialties:  Neurology, Radiology   Why:  3:30PM for follow up of pseudotumor cerebri  Contact information:   [address] [place] [address] [phone]       Follow up with VYAS,DHRUV B., MD On 02/15/2014.   Specialty:  Internal Medicine   Why:  3PM for hospital follow up   Contact information:   [place] [address] [phone]       Discharge Instructions: Discharge Instructions    Call MD for:  difficulty breathing, headache or visual disturbances    Complete by:  As directed      Call MD for:  persistant dizziness or light-headedness    Complete by:  As directed      Call MD for:  persistant nausea and vomiting    Complete by:  As directed      Call MD for:  severe uncontrolled pain    Complete by:  As directed      Call MD for:  temperature >100.4    Complete by:  As directed      Diet - low sodium heart healthy    Complete by:  As directed      Increase activity slowly    Complete by:  As directed             Consultations: Neurosurgery  Procedures Performed:  Ct Head Wo Contrast  02/03/2014   CLINICAL DATA:  Severe headache in sensitivity to light. Nausea and neck pain. Fever.  EXAM: CT HEAD WITHOUT CONTRAST  TECHNIQUE: Contiguous axial images were obtained from the base of the skull through the vertex without intravenous contrast.  COMPARISON:  MRI brain 06/23/2012.  FINDINGS: No acute cortical infarct, hemorrhage, or mass lesion is present. The ventricles are of normal size. No significant extra-axial fluid collection is evident. The paranasal sinuses and mastoid air cells are clear. The calvarium is intact.  IMPRESSION: Negative CT of the head   Electronically Signed   By: Lawrence Santiago M.D.   On: 02/03/2014 15:40   Ct Lumbar Spine W Contrast  02/08/2014   CLINICAL DATA:  Headache. Previous laminectomy at L2-3. Question of CSF leak.  EXAM: LUMBAR MYELOGRAM  FLUOROSCOPY TIME:  0 min 42 seconds  PROCEDURE: After thorough discussion of risks and benefits of the procedure including bleeding, infection, injury to nerves, blood vessels, adjacent structures as well as headache and CSF leak, written and oral informed consent was obtained. Consent was obtained by Dr. Nelson Chimes. Time out form was completed.  Patient was positioned prone on the fluoroscopy table. Local anesthesia was provided with 1% lidocaine without epinephrine after prepped and draped in the usual sterile fashion. Puncture was performed at L5-S1 using a 5 inch 20 gauge via right para median approach. Using a single pass through the dura, the needle was placed within the thecal sac, with return of clear CSF. Opening pressure was measured at 30 cm water. 15 cc Of Omnipaque-180 was injected into the thecal sac, with normal opacification of the nerve roots and cauda equina consistent with free flow within the subarachnoid space.  I personally performed the lumbar puncture and administered the intrathecal contrast. I also personally  performed acquisition of the myelogram images.  TECHNIQUE: Contiguous axial images were obtained through the Lumbar spine after the intrathecal infusion of infusion. Coronal and sagittal reconstructions were obtained of the axial image sets.  COMPARISON:  MRI 02/03/2014  FINDINGS: LUMBAR MYELOGRAM FINDINGS:  There is mild curvature convex to the right. There is no severe stenosis or evidence of focal neural compression. There is a  small anterior extradural defect at L4-5, with mild narrowing of both lateral recesses. There is no evidence of contrast leak age at the L2-3 level. Even on early films, 1 can see some contrast leakage into the epidural space at the site of needle placement at L5-S1. This is clearly the site of the epidural contrast.  CT LUMBAR MYELOGRAM FINDINGS:  T10-11 through L1-2 are normal. No disc pathology. No stenosis. The distal cord and conus are normal with the conus tip at upper L1.  L2-3: Previous left hemilaminectomy. No residual or recurrent disc herniation centrally. There may be some continued bulging of disc in the foraminal to extra foraminal region on the left. No contrast leak at this level.  Below the L3 level, there is epidural contrast, more to the right of midline, with most of this being present at the L5-S1 level. This contrast relates to leakage from the 20 gauge needle puncture site. This would not be a pattern of distribution from leakage at the surgical site on the left at L2-3.  L3-4 disc level: Bulging of the disc. Mild facet and ligamentous hypertrophy. No compressive stenosis.  L4-5: Broad-based herniation of disc material indents the thecal sac. Mild narrowing of both lateral recesses and.  L5-S1:  Normal interspace.  Sacroiliac joints are unremarkable.  IMPRESSION: Opening pressure somewhat elevated at 30 cm water.  No evidence of contrast leakage at the surgical site on the left at L2-3.  No suspicion of recurrent disc herniation at L2-3. Left foraminal to extra  foraminal region does appear abnormal, consistent with discectomy in that region. I cannot rule out possibility of some recurrent disc but I do not specifically discern that.  Shallow broad based disc herniation at L4-5 indents the thecal sac with mild narrowing of both lateral recesses.   Electronically Signed   By: Nelson Chimes M.D.   On: 02/08/2014 13:07   Mr Lumbar Spine W Wo Contrast  02/03/2014   CLINICAL DATA:  Back surgery 01/11/2014. Now with severe headache with light sensitivity. Neck pain and fever. Rule out infection.  EXAM: MRI LUMBAR SPINE WITHOUT AND WITH CONTRAST  TECHNIQUE: Multiplanar and multiecho pulse sequences of the lumbar spine were obtained without and with intravenous contrast.  CONTRAST:  70m MULTIHANCE GADOBENATE DIMEGLUMINE 529 MG/ML IV SOLN  COMPARISON:  Lumbar MRI 11/19/2013  FINDINGS: Patient has congenital lumbar stenosis with a small canal throughout the lumbar spine. Conus medullaris is normal and terminates at L1. No evidence of discitis or osteomyelitis.  L1-2:  Negative  L2-3: Postop laminectomy on the left. There has been discectomy and removal of the disc protrusion on the left. There remains mild left-sided disc bulging and enhancement on the left side of the thecal sac related to recent surgery. There is a small fluid collection in the laminectomy defect. This small fluid collection extends posteriorly to the subcutaneous tissues and may be a small CSF leak. Infection not excluded.  L3-4:  Mild congenital stenosis.  Mild facet hypertrophy bilaterally  L4-5: Disc bulging and facet hypertrophy with mild to moderate spinal stenosis which is unchanged.  L5-S1:  Mild spinal stenosis unchanged.  IMPRESSION: Postop laminectomy and discectomy on the left at L2-3. No recurrent disc protrusion or stenosis. Small fluid collection extends from the laminectomy defect posteriorly to the subcutaneous tissues and may represent postop fluid however CSF leak or infection not excluded.   Congenital lumbar stenosis.   Electronically Signed   By: CFranchot GalloM.D.   On: 02/03/2014 18:52  Dg Myelography Lumbar Inj Lumbosacral  02/08/2014   CLINICAL DATA:  Headache. Previous laminectomy at L2-3. Question of CSF leak.  EXAM: LUMBAR MYELOGRAM  FLUOROSCOPY TIME:  0 min 42 seconds  PROCEDURE: After thorough discussion of risks and benefits of the procedure including bleeding, infection, injury to nerves, blood vessels, adjacent structures as well as headache and CSF leak, written and oral informed consent was obtained. Consent was obtained by Dr. Nelson Chimes. Time out form was completed.  Patient was positioned prone on the fluoroscopy table. Local anesthesia was provided with 1% lidocaine without epinephrine after prepped and draped in the usual sterile fashion. Puncture was performed at L5-S1 using a 5 inch 20 gauge via right para median approach. Using a single pass through the dura, the needle was placed within the thecal sac, with return of clear CSF. Opening pressure was measured at 30 cm water. 15 cc Of Omnipaque-180 was injected into the thecal sac, with normal opacification of the nerve roots and cauda equina consistent with free flow within the subarachnoid space.  I personally performed the lumbar puncture and administered the intrathecal contrast. I also personally performed acquisition of the myelogram images.  TECHNIQUE: Contiguous axial images were obtained through the Lumbar spine after the intrathecal infusion of infusion. Coronal and sagittal reconstructions were obtained of the axial image sets.  COMPARISON:  MRI 02/03/2014  FINDINGS: LUMBAR MYELOGRAM FINDINGS:  There is mild curvature convex to the right. There is no severe stenosis or evidence of focal neural compression. There is a small anterior extradural defect at L4-5, with mild narrowing of both lateral recesses. There is no evidence of contrast leak age at the L2-3 level. Even on early films, 1 can see some contrast  leakage into the epidural space at the site of needle placement at L5-S1. This is clearly the site of the epidural contrast.  CT LUMBAR MYELOGRAM FINDINGS:  T10-11 through L1-2 are normal. No disc pathology. No stenosis. The distal cord and conus are normal with the conus tip at upper L1.  L2-3: Previous left hemilaminectomy. No residual or recurrent disc herniation centrally. There may be some continued bulging of disc in the foraminal to extra foraminal region on the left. No contrast leak at this level.  Below the L3 level, there is epidural contrast, more to the right of midline, with most of this being present at the L5-S1 level. This contrast relates to leakage from the 20 gauge needle puncture site. This would not be a pattern of distribution from leakage at the surgical site on the left at L2-3.  L3-4 disc level: Bulging of the disc. Mild facet and ligamentous hypertrophy. No compressive stenosis.  L4-5: Broad-based herniation of disc material indents the thecal sac. Mild narrowing of both lateral recesses and.  L5-S1:  Normal interspace.  Sacroiliac joints are unremarkable.  IMPRESSION: Opening pressure somewhat elevated at 30 cm water.  No evidence of contrast leakage at the surgical site on the left at L2-3.  No suspicion of recurrent disc herniation at L2-3. Left foraminal to extra foraminal region does appear abnormal, consistent with discectomy in that region. I cannot rule out possibility of some recurrent disc but I do not specifically discern that.  Shallow broad based disc herniation at L4-5 indents the thecal sac with mild narrowing of both lateral recesses.   Electronically Signed   By: Nelson Chimes M.D.   On: 02/08/2014 13:07    Admission HPI: Sandra [name] is a 38yo woman w/ PMHx of pseudotumor  cerebri, chronic migraines, vertigo, postural orthostatic tachycardia syndrome, congenital lumbar stenosis, and recent back surgery (lumbar laminectomy/decompression) on 01/11/14 who presents to the ED  with a 2-3 day history of headache and fever. Patient saw her PCP (Dr. Woody Seller in [place]) today and noted neck pain and photophobia in addition to her headache and fever so her PCP instructed her to come to [facility] ED because of concern for meningitis. She received Rocephin 1 g at her PCP office around 11 AM. She describes the headache as located in the back of her head and neck, throbbing/pounding, and not different from her usual headaches. She states her headache has eased up some since being in the ED. She reports subjective fevers, but does not know what her temperature was at her PCP's office or at home. She also notes worsening back pain, nausea without vomiting, sensitivity to light, and decreased appetite. She denies blurry vision, diplopia, dizziness, tingling/numbness in her extremities, rashes, changes in speech, abdominal pain, and confusion. Of note, she follows with Dr. Rexene Alberts from [facility]. She states she has fluid taken off from her brain for her pseudotumor cerebri and this was last done 2-3 months ago.   In the ED, patient was afebrile. Dr. Ellene Route from neurosurgery was curbsided and recommended MRI of her lumbar spine and not to perform LP at this time given her recent surgery. She was given Rocephin and Vancomycin in the ED.   Hospital Course by problem list: Principal Problem:   Headache, spinal, postoperative Active Problems:   Morbid obesity   Lumbar herniated disc   Pseudotumor cerebri   Possible Bacterial meningitis   1. Postoperative headache: Patient presented with 2-3 day history of subjective fever, headache, neck pain, and photophobia in setting of recent neurosurgery (lumbar laminectomy/decompression) concerning for possible meningitis. Patient received Rocephin 1 g at her PCP's office at 11 AM and then another dose of Rocephin 1 g in the ED plus Vancomycin 1 g. She was afebrile and headache improving. She was alert and oriented x 3. Neurological exam was non-focal  and Kernig and Brudzinski's signs negative. WBC count normal at 8.0. Lactic acid normal. CT head negative. MRI lumbar spine showed a small fluid collection that extends from the laminectomy defect to the subcutaneous tissues that could represent postop fluid, but CSF leak or infection cannot be excluded. Neurosurgery was consulted and recommended to not perform lumbar puncture with her recent surgery. Patient also has history of chronic migraines and vertigo. Possible that her headache is related to her chronic migraines since she stated her headache is not much different than her usual ones. She was admitted and placed on IV vancomycin and ceftazidime for presumed nosocomial bacterial meningitis resulting from postneurosurgical infection. CRP was negative. ESR was 33. Procalcitonin level was wnl. She was evaluated by her neurosurgeon, Dr. Arnoldo Morale. She underwent CT myelogram - No leak noted. Her LP suggested no meningitis infection. She had a mildly increased opening pressure of 21mhg. Her pain was controlled on toradol and she was transitioned to her home ibuprofen. She will follow up with her neurosurgeon in 1-2 months for a routine post op check. She will also follow up with her neurologist, Dr. ARexene Alberts  2. Pseudotumor Cerebri: [facility] She takes Diamox 1000 mg BID at home. She receives serial lumbar punctures as well with last one 2-3 months ago per patient. However, per records last documented LP on 02/04/13. Continued home Diamox.   3. Depression/Anxiety: Patient takes  Prozac 40 mg daily and Doxepin 30 mg QHS at home. Continued home meds during hospitalization.   Discharge Vitals:   BP 108/71 mmHg  Pulse 80  Temp(Src) 98.6 F (37 C) (Oral)  Resp 13  Ht '5\' 6"'  (1.676 m)  Wt 269 lb 14.4 oz (122.426 kg)  BMI 43.58 kg/m2  SpO2 97%  Discharge Labs:  Recent Results (from the past 2160 hour(s))  Surgical pcr screen     Status: Abnormal   Collection Time:  01/10/14 12:41 PM  Result Value Ref Range   MRSA, PCR POSITIVE (A) NEGATIVE   Staphylococcus aureus POSITIVE (A) NEGATIVE    Comment:        The Xpert SA Assay (FDA approved for NASAL specimens in patients over 35 years of age), is one component of a comprehensive surveillance program.  Test performance has been validated by [employer] for patients greater than or equal to 40 year old. It is not intended to diagnose infection nor to guide or monitor treatment.   CBC     Status: None   Collection Time: 01/10/14 12:42 PM  Result Value Ref Range   WBC 6.1 4.0 - 10.5 K/uL   RBC 4.34 3.87 - 5.11 MIL/uL   Hemoglobin 13.0 12.0 - 15.0 g/dL   HCT 39.9 36.0 - 46.0 %   MCV 91.9 78.0 - 100.0 fL   MCH 30.0 26.0 - 34.0 pg   MCHC 32.6 30.0 - 36.0 g/dL   RDW 13.0 11.5 - 15.5 %   Platelets 182 150 - 400 K/uL  Comprehensive metabolic panel     Status: Abnormal   Collection Time: 01/10/14 12:42 PM  Result Value Ref Range   Sodium 140 137 - 147 mEq/L   Potassium 4.0 3.7 - 5.3 mEq/L   Chloride 108 96 - 112 mEq/L   CO2 21 19 - 32 mEq/L   Glucose, Bld 95 70 - 99 mg/dL   BUN 16 6 - 23 mg/dL   Creatinine, Ser 0.91 0.50 - 1.10 mg/dL   Calcium 9.0 8.4 - 10.5 mg/dL   Total Protein 7.1 6.0 - 8.3 g/dL   Albumin 3.3 (L) 3.5 - 5.2 g/dL   AST 13 0 - 37 U/L   ALT 15 0 - 35 U/L   Alkaline Phosphatase 99 39 - 117 U/L   Total Bilirubin 0.2 (L) 0.3 - 1.2 mg/dL   GFR calc non Af Amer 78 (L) >90 mL/min   GFR calc Af Amer >90 >90 mL/min    Comment: (NOTE) The eGFR has been calculated using the CKD EPI equation. This calculation has not been validated in all clinical situations. eGFR's persistently <90 mL/min signify possible Chronic Kidney Disease.    Anion gap 11 5 - 15  Basic metabolic panel     Status: Abnormal   Collection Time: 02/03/14  2:28 PM  Result Value Ref Range   Sodium 137 137 - 147 mEq/L   Potassium 3.7 3.7 - 5.3 mEq/L   Chloride 104 96 - 112 mEq/L   CO2 19 19 - 32 mEq/L    Glucose, Bld 97 70 - 99 mg/dL   BUN 12 6 - 23 mg/dL   Creatinine, Ser 1.15 (H) 0.50 - 1.10 mg/dL   Calcium 8.8 8.4 - 10.5 mg/dL   GFR calc non Af Amer 59 (L) >90 mL/min   GFR calc Af Amer 69 (L) >90 mL/min    Comment: (NOTE) The eGFR has been calculated using the CKD EPI equation. This calculation  has not been validated in all clinical situations. eGFR's persistently <90 mL/min signify possible Chronic Kidney Disease.    Anion gap 14 5 - 15  CBC with Differential     Status: None   Collection Time: 02/03/14  2:28 PM  Result Value Ref Range   WBC 8.0 4.0 - 10.5 K/uL   RBC 4.22 3.87 - 5.11 MIL/uL   Hemoglobin 12.4 12.0 - 15.0 g/dL   HCT 38.3 36.0 - 46.0 %   MCV 90.8 78.0 - 100.0 fL   MCH 29.4 26.0 - 34.0 pg   MCHC 32.4 30.0 - 36.0 g/dL   RDW 12.7 11.5 - 15.5 %   Platelets 198 150 - 400 K/uL   Neutrophils Relative % 59 43 - 77 %   Neutro Abs 4.8 1.7 - 7.7 K/uL   Lymphocytes Relative 30 12 - 46 %   Lymphs Abs 2.4 0.7 - 4.0 K/uL   Monocytes Relative 5 3 - 12 %   Monocytes Absolute 0.4 0.1 - 1.0 K/uL   Eosinophils Relative 5 0 - 5 %   Eosinophils Absolute 0.4 0.0 - 0.7 K/uL   Basophils Relative 1 0 - 1 %   Basophils Absolute 0.1 0.0 - 0.1 K/uL  Procalcitonin - Baseline     Status: None   Collection Time: 02/03/14  2:28 PM  Result Value Ref Range   Procalcitonin <0.10 ng/mL    Comment:        Interpretation: PCT (Procalcitonin) <= 0.5 ng/mL: Systemic infection (sepsis) is not likely. Local bacterial infection is possible. (NOTE)         ICU PCT Algorithm               Non ICU PCT Algorithm    ----------------------------     ------------------------------         PCT < 0.25 ng/mL                 PCT < 0.1 ng/mL     Stopping of antibiotics            Stopping of antibiotics       strongly encouraged.               strongly encouraged.    ----------------------------     ------------------------------       PCT level decrease by               PCT < 0.25 ng/mL       >= 80%  from peak PCT       OR PCT 0.25 - 0.5 ng/mL          Stopping of antibiotics                                             encouraged.     Stopping of antibiotics           encouraged.    ----------------------------     ------------------------------       PCT level decrease by              PCT >= 0.25 ng/mL       < 80% from peak PCT        AND PCT >= 0.5 ng/mL            Continuin g antibiotics  encouraged.       Continuing antibiotics            encouraged.    ----------------------------     ------------------------------     PCT level increase compared          PCT > 0.5 ng/mL         with peak PCT AND          PCT >= 0.5 ng/mL             Escalation of antibiotics                                          strongly encouraged.      Escalation of antibiotics        strongly encouraged.   I-Stat CG4 Lactic Acid, ED     Status: None   Collection Time: 02/03/14  2:38 PM  Result Value Ref Range   Lactic Acid, Venous 0.81 0.5 - 2.2 mmol/L  Culture, blood (routine x 2)     Status: None   Collection Time: 02/03/14  2:40 PM  Result Value Ref Range   Specimen Description BLOOD ARM LEFT    Special Requests BOTTLES DRAWN AEROBIC AND ANAEROBIC 5CC    Culture  Setup Time      02/03/2014 20:36 Performed at [place] 5 DAYS Performed at [facility]    Report Status 02/09/2014 FINAL   Culture, blood (routine x 2)     Status: None   Collection Time: 02/03/14  2:45 PM  Result Value Ref Range   Specimen Description BLOOD HAND RIGHT    Special Requests BOTTLES DRAWN AEROBIC AND ANAEROBIC 5CC    Culture  Setup Time      02/03/2014 20:39 Performed at [place] 5 DAYS Performed at [facility]    Report Status 02/09/2014 FINAL   Urinalysis, Routine w reflex microscopic     Status: None   Collection Time: 02/03/14  2:55 PM  Result Value Ref Range    Color, Urine YELLOW YELLOW   APPearance CLEAR CLEAR   Specific Gravity, Urine 1.013 1.005 - 1.030   pH 6.0 5.0 - 8.0   Glucose, UA NEGATIVE NEGATIVE mg/dL   Hgb urine dipstick NEGATIVE NEGATIVE   Bilirubin Urine NEGATIVE NEGATIVE   Ketones, ur NEGATIVE NEGATIVE mg/dL   Protein, ur NEGATIVE NEGATIVE mg/dL   Urobilinogen, UA 0.2 0.0 - 1.0 mg/dL   Nitrite NEGATIVE NEGATIVE   Leukocytes, UA NEGATIVE NEGATIVE    Comment: MICROSCOPIC NOT DONE ON URINES WITH NEGATIVE PROTEIN, BLOOD, LEUKOCYTES, NITRITE, OR GLUCOSE <1000 mg/dL.  MRSA PCR Screening     Status: None   Collection Time: 02/03/14 11:58 PM  Result Value Ref Range   MRSA by PCR NEGATIVE NEGATIVE    Comment:        The GeneXpert MRSA Assay (FDA approved for NASAL specimens only), is one component of a comprehensive MRSA colonization surveillance program. It is not intended to diagnose MRSA infection nor to guide or monitor treatment for MRSA infections.   Comprehensive metabolic panel     Status: Abnormal   Collection Time: 02/04/14  4:47 AM  Result Value Ref Range   Sodium 141 137 - 147 mEq/L  Potassium 3.8 3.7 - 5.3 mEq/L   Chloride 108 96 - 112 mEq/L   CO2 23 19 - 32 mEq/L   Glucose, Bld 85 70 - 99 mg/dL   BUN 13 6 - 23 mg/dL   Creatinine, Ser 1.05 0.50 - 1.10 mg/dL   Calcium 8.5 8.4 - 10.5 mg/dL   Total Protein 5.9 (L) 6.0 - 8.3 g/dL   Albumin 2.7 (L) 3.5 - 5.2 g/dL   AST 17 0 - 37 U/L   ALT 15 0 - 35 U/L   Alkaline Phosphatase 101 39 - 117 U/L   Total Bilirubin <0.2 (L) 0.3 - 1.2 mg/dL   GFR calc non Af Amer 66 (L) >90 mL/min   GFR calc Af Amer 76 (L) >90 mL/min    Comment: (NOTE) The eGFR has been calculated using the CKD EPI equation. This calculation has not been validated in all clinical situations. eGFR's persistently <90 mL/min signify possible Chronic Kidney Disease.    Anion gap 10 5 - 15  CBC with Differential     Status: Abnormal   Collection Time: 02/04/14  4:47 AM  Result Value Ref Range    WBC 4.4 4.0 - 10.5 K/uL   RBC 3.66 (L) 3.87 - 5.11 MIL/uL   Hemoglobin 11.1 (L) 12.0 - 15.0 g/dL   HCT 34.5 (L) 36.0 - 46.0 %   MCV 94.3 78.0 - 100.0 fL   MCH 30.3 26.0 - 34.0 pg   MCHC 32.2 30.0 - 36.0 g/dL   RDW 12.7 11.5 - 15.5 %   Platelets 154 150 - 400 K/uL   Neutrophils Relative % 59 43 - 77 %   Neutro Abs 2.6 1.7 - 7.7 K/uL   Lymphocytes Relative 31 12 - 46 %   Lymphs Abs 1.4 0.7 - 4.0 K/uL   Monocytes Relative 5 3 - 12 %   Monocytes Absolute 0.2 0.1 - 1.0 K/uL   Eosinophils Relative 5 0 - 5 %   Eosinophils Absolute 0.2 0.0 - 0.7 K/uL   Basophils Relative 0 0 - 1 %   Basophils Absolute 0.0 0.0 - 0.1 K/uL  C-reactive protein     Status: Abnormal   Collection Time: 02/04/14  4:47 AM  Result Value Ref Range   CRP <0.5 (L) <0.60 mg/dL    Comment: Performed at [facility]  Sedimentation rate     Status: Abnormal   Collection Time: 02/04/14  4:47 AM  Result Value Ref Range   Sed Rate 33 (H) 0 - 22 mm/hr  Procalcitonin     Status: None   Collection Time: 02/05/14  5:14 AM  Result Value Ref Range   Procalcitonin <0.10 ng/mL    Comment:        Interpretation: PCT (Procalcitonin) <= 0.5 ng/mL: Systemic infection (sepsis) is not likely. Local bacterial infection is possible. REPEATED TO VERIFY (NOTE)         ICU PCT Algorithm               Non ICU PCT Algorithm    ----------------------------     ------------------------------         PCT < 0.25 ng/mL                 PCT < 0.1 ng/mL     Stopping of antibiotics            Stopping of antibiotics       strongly encouraged.  strongly encouraged.    ----------------------------     ------------------------------       PCT level decrease by               PCT < 0.25 ng/mL       >= 80% from peak PCT       OR PCT 0.25 - 0.5 ng/mL          Stopping of antibiotics                                             encouraged.     Stopping of antibiotics           encouraged.    ----------------------------      ------------------------------       PCT level decrease by              PCT >= 0.25 ng/mL       < 80% from peak PCT        AND PCT >= 0.5 ng/mL             Continuing antibiotics                                              encouraged.       Continuing antibiotics            encouraged.    ----------------------------     ------------------------------     PCT level increase compared          PCT > 0.5 ng/mL         with peak PCT AND          PCT >= 0.5 ng/mL             Escalation of antibiotics                                          strongly encouraged.      Escalation of antibiotics        strongly encouraged.   CBC     Status: Abnormal   Collection Time: 02/05/14  5:14 AM  Result Value Ref Range   WBC 4.8 4.0 - 10.5 K/uL   RBC 3.59 (L) 3.87 - 5.11 MIL/uL   Hemoglobin 10.5 (L) 12.0 - 15.0 g/dL   HCT 33.1 (L) 36.0 - 46.0 %   MCV 92.2 78.0 - 100.0 fL   MCH 29.2 26.0 - 34.0 pg   MCHC 31.7 30.0 - 36.0 g/dL   RDW 12.5 11.5 - 15.5 %   Platelets 159 150 - 400 K/uL  Basic metabolic panel     Status: Abnormal   Collection Time: 02/05/14  5:14 AM  Result Value Ref Range   Sodium 137 137 - 147 mEq/L   Potassium 3.8 3.7 - 5.3 mEq/L   Chloride 107 96 - 112 mEq/L   CO2 18 (L) 19 - 32 mEq/L   Glucose, Bld 96 70 - 99 mg/dL   BUN 10 6 - 23 mg/dL   Creatinine, Ser 0.99 0.50 - 1.10 mg/dL   Calcium 8.5 8.4 - 10.5 mg/dL   GFR calc non Af  Amer 71 (L) >90 mL/min   GFR calc Af Amer 82 (L) >90 mL/min    Comment: (NOTE) The eGFR has been calculated using the CKD EPI equation. This calculation has not been validated in all clinical situations. eGFR's persistently <90 mL/min signify possible Chronic Kidney Disease.    Anion gap 12 5 - 15  CBC with Differential     Status: Abnormal   Collection Time: 02/06/14  5:57 AM  Result Value Ref Range   WBC 4.4 4.0 - 10.5 K/uL   RBC 3.45 (L) 3.87 - 5.11 MIL/uL   Hemoglobin 10.2 (L) 12.0 - 15.0 g/dL   HCT 32.0 (L) 36.0 - 46.0 %   MCV 92.8 78.0 -  100.0 fL   MCH 29.6 26.0 - 34.0 pg   MCHC 31.9 30.0 - 36.0 g/dL   RDW 12.6 11.5 - 15.5 %   Platelets 150 150 - 400 K/uL   Neutrophils Relative % 52 43 - 77 %   Neutro Abs 2.3 1.7 - 7.7 K/uL   Lymphocytes Relative 32 12 - 46 %   Lymphs Abs 1.4 0.7 - 4.0 K/uL   Monocytes Relative 8 3 - 12 %   Monocytes Absolute 0.4 0.1 - 1.0 K/uL   Eosinophils Relative 7 (H) 0 - 5 %   Eosinophils Absolute 0.3 0.0 - 0.7 K/uL   Basophils Relative 1 0 - 1 %   Basophils Absolute 0.0 0.0 - 0.1 K/uL  Basic metabolic panel     Status: Abnormal   Collection Time: 02/06/14  5:57 AM  Result Value Ref Range   Sodium 141 137 - 147 mEq/L   Potassium 3.6 (L) 3.7 - 5.3 mEq/L   Chloride 110 96 - 112 mEq/L   CO2 21 19 - 32 mEq/L   Glucose, Bld 100 (H) 70 - 99 mg/dL   BUN 7 6 - 23 mg/dL   Creatinine, Ser 0.97 0.50 - 1.10 mg/dL   Calcium 8.5 8.4 - 10.5 mg/dL   GFR calc non Af Amer 73 (L) >90 mL/min   GFR calc Af Amer 84 (L) >90 mL/min    Comment: (NOTE) The eGFR has been calculated using the CKD EPI equation. This calculation has not been validated in all clinical situations. eGFR's persistently <90 mL/min signify possible Chronic Kidney Disease.    Anion gap 10 5 - 15  Vancomycin, trough     Status: None   Collection Time: 02/06/14 11:21 PM  Result Value Ref Range   Vancomycin Tr 11.5 10.0 - 20.0 ug/mL  Basic metabolic panel     Status: Abnormal   Collection Time: 02/07/14  5:31 AM  Result Value Ref Range   Sodium 139 137 - 147 mEq/L   Potassium 3.7 3.7 - 5.3 mEq/L   Chloride 106 96 - 112 mEq/L   CO2 21 19 - 32 mEq/L   Glucose, Bld 98 70 - 99 mg/dL   BUN 8 6 - 23 mg/dL   Creatinine, Ser 0.89 0.50 - 1.10 mg/dL   Calcium 8.6 8.4 - 10.5 mg/dL   GFR calc non Af Amer 81 (L) >90 mL/min   GFR calc Af Amer >90 >90 mL/min    Comment: (NOTE) The eGFR has been calculated using the CKD EPI equation. This calculation has not been validated in all clinical situations. eGFR's persistently <90 mL/min signify  possible Chronic Kidney Disease.    Anion gap 12 5 - 15  CBC     Status: Abnormal   Collection Time: 02/07/14  5:31 AM  Result Value Ref Range   WBC 4.5 4.0 - 10.5 K/uL   RBC 3.48 (L) 3.87 - 5.11 MIL/uL   Hemoglobin 10.2 (L) 12.0 - 15.0 g/dL   HCT 31.9 (L) 36.0 - 46.0 %   MCV 91.7 78.0 - 100.0 fL   MCH 29.3 26.0 - 34.0 pg   MCHC 32.0 30.0 - 36.0 g/dL   RDW 12.8 11.5 - 15.5 %   Platelets 143 (L) 150 - 400 K/uL  Procalcitonin     Status: None   Collection Time: 02/07/14  5:31 AM  Result Value Ref Range   Procalcitonin <0.10 ng/mL    Comment:        Interpretation: PCT (Procalcitonin) <= 0.5 ng/mL: Systemic infection (sepsis) is not likely. Local bacterial infection is possible. REPEATED TO VERIFY (NOTE)         ICU PCT Algorithm               Non ICU PCT Algorithm    ----------------------------     ------------------------------         PCT < 0.25 ng/mL                 PCT < 0.1 ng/mL     Stopping of antibiotics            Stopping of antibiotics       strongly encouraged.               strongly encouraged.    ----------------------------     ------------------------------       PCT level decrease by               PCT < 0.25 ng/mL       >= 80% from peak PCT       OR PCT 0.25 - 0.5 ng/mL          Stopping of antibiotics                                             encouraged.     Stopping of antibiotics           encouraged.    ----------------------------     ------------------------------       PCT level decrease by              PCT >= 0.25 ng/mL       < 80% from peak PCT        AND PCT >= 0.5 ng/mL             Continuing antibiotics                                              encouraged.       Continuing antibiotics            encouraged.    ----------------------------     ------------------------------     PCT level increase compared          PCT > 0.5 ng/mL         with peak PCT AND          PCT >= 0.5 ng/mL             Escalation of antibiotics  strongly encouraged.      Escalation of antibiotics        strongly encouraged.   CBC     Status: Abnormal   Collection Time: 02/08/14  5:11 AM  Result Value Ref Range   WBC 4.7 4.0 - 10.5 K/uL   RBC 3.47 (L) 3.87 - 5.11 MIL/uL   Hemoglobin 10.1 (L) 12.0 - 15.0 g/dL   HCT 31.5 (L) 36.0 - 46.0 %   MCV 90.8 78.0 - 100.0 fL   MCH 29.1 26.0 - 34.0 pg   MCHC 32.1 30.0 - 36.0 g/dL   RDW 12.7 11.5 - 15.5 %   Platelets 146 (L) 150 - 400 K/uL  Basic metabolic panel     Status: Abnormal   Collection Time: 02/08/14  5:11 AM  Result Value Ref Range   Sodium 137 137 - 147 mEq/L   Potassium 3.5 (L) 3.7 - 5.3 mEq/L   Chloride 107 96 - 112 mEq/L   CO2 21 19 - 32 mEq/L   Glucose, Bld 86 70 - 99 mg/dL   BUN 11 6 - 23 mg/dL   Creatinine, Ser 0.84 0.50 - 1.10 mg/dL   Calcium 8.6 8.4 - 10.5 mg/dL   GFR calc non Af Amer 86 (L) >90 mL/min   GFR calc Af Amer >90 >90 mL/min    Comment: (NOTE) The eGFR has been calculated using the CKD EPI equation. This calculation has not been validated in all clinical situations. eGFR's persistently <90 mL/min signify possible Chronic Kidney Disease.    Anion gap 9 5 - 15  Glucose, CSF     Status: None   Collection Time: 02/08/14 12:15 PM  Result Value Ref Range   Glucose, CSF 51 43 - 76 mg/dL  Protein, CSF     Status: None   Collection Time: 02/08/14 12:15 PM  Result Value Ref Range   Total  Protein, CSF 42 15 - 45 mg/dL  CSF cell count with differential     Status: Abnormal   Collection Time: 02/08/14 12:15 PM  Result Value Ref Range   Tube # 4    Color, CSF COLORLESS COLORLESS   Appearance, CSF CLEAR CLEAR   Supernatant NOT INDICATED    RBC Count, CSF 4 (H) 0 /cu mm   WBC, CSF 3 0 - 5 /cu mm   Segmented Neutrophils-CSF TOO FEW TO COUNT, SMEAR AVAILABLE FOR REVIEW 0 - 6 %    Comment: RARE   Lymphs, CSF FEW 40 - 80 %   Monocyte-Macrophage-Spinal Fluid FEW 15 - 45 %  CSF culture     Status: None   Collection Time: 02/08/14  12:15 PM  Result Value Ref Range   Specimen Description CSF    Special Requests NONE    Gram Stain      WBC PRESENT,BOTH PMN AND MONONUCLEAR NO ORGANISMS SEEN CYTOSPIN Performed at [facility] Performed at [place] [facility] DAYS Performed at [facility]    Report Status 02/12/2014 FINAL   Gram stain     Status: None   Collection Time: 02/08/14 12:15 PM  Result Value Ref Range   Specimen Description CSF    Special Requests NONE    Gram Stain      CYTOSPIN PREP WBC PRESENT, PREDOMINANTLY MONONUCLEAR NO ORGANISMS SEEN    Report Status 02/08/2014 FINAL   Basic metabolic panel     Status: Abnormal   Collection Time:  02/09/14  5:00 AM  Result Value Ref Range   Sodium 141 137 - 147 mEq/L   Potassium 3.5 (L) 3.7 - 5.3 mEq/L   Chloride 110 96 - 112 mEq/L   CO2 19 19 - 32 mEq/L   Glucose, Bld 89 70 - 99 mg/dL   BUN 12 6 - 23 mg/dL   Creatinine, Ser 0.85 0.50 - 1.10 mg/dL   Calcium 8.5 8.4 - 10.5 mg/dL   GFR calc non Af Amer 85 (L) >90 mL/min   GFR calc Af Amer >90 >90 mL/min    Comment: (NOTE) The eGFR has been calculated using the CKD EPI equation. This calculation has not been validated in all clinical situations. eGFR's persistently <90 mL/min signify possible Chronic Kidney Disease.    Anion gap 12 5 - 15  CBC     Status: Abnormal   Collection Time: 02/09/14  5:00 AM  Result Value Ref Range   WBC 4.6 4.0 - 10.5 K/uL   RBC 3.49 (L) 3.87 - 5.11 MIL/uL   Hemoglobin 10.1 (L) 12.0 - 15.0 g/dL   HCT 31.5 (L) 36.0 - 46.0 %   MCV 90.3 78.0 - 100.0 fL   MCH 28.9 26.0 - 34.0 pg   MCHC 32.1 30.0 - 36.0 g/dL   RDW 12.8 11.5 - 15.5 %   Platelets 158 150 - 400 K/uL    Signed: Jacques Earthly, MD 02/11/2014, 6:43 PM    Services Ordered on Discharge: None Equipment Ordered on Discharge: None

## 2014-02-12 LAB — CSF CULTURE: Culture: NO GROWTH

## 2014-02-13 ENCOUNTER — Ambulatory Visit (INDEPENDENT_AMBULATORY_CARE_PROVIDER_SITE_OTHER): Payer: BC Managed Care – PPO | Admitting: Neurology

## 2014-02-13 ENCOUNTER — Encounter: Payer: Self-pay | Admitting: Neurology

## 2014-02-13 VITALS — BP 125/77 | HR 94 | Temp 97.1°F | Ht 66.0 in | Wt 255.0 lb

## 2014-02-13 DIAGNOSIS — N200 Calculus of kidney: Secondary | ICD-10-CM

## 2014-02-13 DIAGNOSIS — Z9889 Other specified postprocedural states: Secondary | ICD-10-CM

## 2014-02-13 DIAGNOSIS — G932 Benign intracranial hypertension: Secondary | ICD-10-CM

## 2014-02-13 DIAGNOSIS — G43119 Migraine with aura, intractable, without status migrainosus: Secondary | ICD-10-CM

## 2014-02-13 DIAGNOSIS — R635 Abnormal weight gain: Secondary | ICD-10-CM

## 2014-02-13 DIAGNOSIS — T50905A Adverse effect of unspecified drugs, medicaments and biological substances, initial encounter: Secondary | ICD-10-CM

## 2014-02-13 MED ORDER — ACETAZOLAMIDE ER 500 MG PO CP12: 1000.0000 mg | ORAL_CAPSULE | Freq: Two times a day (BID) | ORAL | Status: DC

## 2014-02-13 NOTE — Patient Instructions (Signed)
We will keep using Diamox 1000 mg twice daily.  We will schedule you for your next botox injection for migraines.  You have to work on your weight loss.  You will need to check with Dr. Lovell SheehanJenkins about your post-op check.
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
## 2014-04-03 ENCOUNTER — Telehealth: Payer: Self-pay | Admitting: Neurology

## 2014-04-03 NOTE — Telephone Encounter (Signed)
Called and confirmed earlier time change per physician's request.

## 2014-04-04 ENCOUNTER — Ambulatory Visit (INDEPENDENT_AMBULATORY_CARE_PROVIDER_SITE_OTHER): Payer: BLUE CROSS/BLUE SHIELD | Admitting: Neurology

## 2014-04-04 ENCOUNTER — Ambulatory Visit (INDEPENDENT_AMBULATORY_CARE_PROVIDER_SITE_OTHER): Payer: BLUE CROSS/BLUE SHIELD | Admitting: *Deleted

## 2014-04-04 ENCOUNTER — Encounter: Payer: Self-pay | Admitting: Neurology

## 2014-04-04 VITALS — BP 136/83 | HR 90 | Temp 98.1°F | Ht 66.0 in | Wt 245.0 lb

## 2014-04-04 VITALS — BP 103/65 | HR 73

## 2014-04-04 DIAGNOSIS — G932 Benign intracranial hypertension: Secondary | ICD-10-CM

## 2014-04-04 DIAGNOSIS — G43011 Migraine without aura, intractable, with status migrainosus: Secondary | ICD-10-CM

## 2014-04-04 DIAGNOSIS — M5442 Lumbago with sciatica, left side: Secondary | ICD-10-CM

## 2014-04-04 DIAGNOSIS — G43909 Migraine, unspecified, not intractable, without status migrainosus: Secondary | ICD-10-CM

## 2014-04-04 MED ORDER — KETOROLAC TROMETHAMINE 30 MG/ML IJ SOLN
30.0000 mg | Freq: Once | INTRAMUSCULAR | Status: AC
  Administered 2014-04-04: 30 mg via INTRAVENOUS

## 2014-04-04 MED ORDER — SODIUM CHLORIDE 0.9 % IV SOLN
250.0000 mg | INTRAVENOUS | Status: DC
  Administered 2014-04-04: 250 mg via INTRAVENOUS

## 2014-04-04 MED ORDER — PROCHLORPERAZINE EDISYLATE 5 MG/ML IJ SOLN
10.0000 mg | Freq: Once | INTRAMUSCULAR | Status: AC
  Administered 2014-04-04: 10 mg via INTRAVENOUS

## 2014-04-04 MED ORDER — METHYLPREDNISOLONE 4 MG PO KIT: PACK | ORAL | Status: DC

## 2014-04-04 MED ORDER — VALPROATE SODIUM 500 MG/5ML IV SOLN
500.0000 mg | INTRAVENOUS | Status: DC
  Administered 2014-04-04: 500 mg via INTRAVENOUS

## 2014-04-04 NOTE — Patient Instructions (Signed)
To call back as needed. 

## 2014-04-04 NOTE — Patient Instructions (Addendum)
We will do an infusion with steroid (for inflammation), compazine (nausea med), depacon (for headache) and toradol (non-steroidal) today and I will prescribe a Medrol dose pask.  We will schedule you for another botox injection.   Please schedule a follow up with your ophthalmologist, Dr. Burgess Estelleanner to make sure, your eye exam is fine and there is no pressure build up (papilledema).

## 2014-04-04 NOTE — Progress Notes (Signed)
Order for Migraine cocktail ( Toradol 30 mg IV, Depacon 500mg  IV, Solumedrol 250mg  IV, Compazine 10mg  IV).  Level 10 occipital pain beginning of infusion, to level 6 post infusion.  Pt drowsy.   Husband driving her home.  NAD.  To check out accompanied by myself.
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
## 2014-04-19 ENCOUNTER — Other Ambulatory Visit: Payer: Self-pay | Admitting: Nurse Practitioner

## 2014-04-20 ENCOUNTER — Other Ambulatory Visit: Payer: Self-pay | Admitting: Neurology

## 2014-04-27 ENCOUNTER — Telehealth: Payer: Self-pay

## 2014-04-27 NOTE — Telephone Encounter (Signed)
[employer]otox replied to Ins Verification stating the patient's BCBS policy [phone]([id number]) ended on 04/24/2014.  I called the patient to obtain new ins info.  Got no answer.  Left message.

## 2014-08-09 ENCOUNTER — Other Ambulatory Visit (HOSPITAL_COMMUNITY): Payer: Self-pay | Admitting: Internal Medicine

## 2014-08-09 DIAGNOSIS — Z1231 Encounter for screening mammogram for malignant neoplasm of breast: Secondary | ICD-10-CM

## 2014-08-10 ENCOUNTER — Ambulatory Visit (INDEPENDENT_AMBULATORY_CARE_PROVIDER_SITE_OTHER): Payer: Medicaid Other | Admitting: Neurology

## 2014-08-10 ENCOUNTER — Telehealth: Payer: Self-pay | Admitting: Neurology

## 2014-08-10 ENCOUNTER — Ambulatory Visit (INDEPENDENT_AMBULATORY_CARE_PROVIDER_SITE_OTHER): Payer: Medicaid Other | Admitting: *Deleted

## 2014-08-10 ENCOUNTER — Encounter: Payer: Self-pay | Admitting: Neurology

## 2014-08-10 ENCOUNTER — Ambulatory Visit: Payer: BLUE CROSS/BLUE SHIELD | Admitting: Neurology

## 2014-08-10 VITALS — BP 150/80 | HR 72 | Ht 66.0 in | Wt 243.0 lb

## 2014-08-10 DIAGNOSIS — G43909 Migraine, unspecified, not intractable, without status migrainosus: Secondary | ICD-10-CM

## 2014-08-10 DIAGNOSIS — R51 Headache: Secondary | ICD-10-CM | POA: Diagnosis not present

## 2014-08-10 DIAGNOSIS — G932 Benign intracranial hypertension: Secondary | ICD-10-CM

## 2014-08-10 DIAGNOSIS — G43119 Migraine with aura, intractable, without status migrainosus: Secondary | ICD-10-CM | POA: Diagnosis not present

## 2014-08-10 DIAGNOSIS — T50905A Adverse effect of unspecified drugs, medicaments and biological substances, initial encounter: Secondary | ICD-10-CM

## 2014-08-10 DIAGNOSIS — R519 Headache, unspecified: Secondary | ICD-10-CM

## 2014-08-10 DIAGNOSIS — R635 Abnormal weight gain: Secondary | ICD-10-CM

## 2014-08-10 DIAGNOSIS — Z9889 Other specified postprocedural states: Secondary | ICD-10-CM

## 2014-08-10 MED ORDER — KETOROLAC TROMETHAMINE 30 MG/ML IJ SOLN
30.0000 mg | Freq: Once | INTRAMUSCULAR | Status: AC
  Administered 2014-08-10: 30 mg via INTRAMUSCULAR

## 2014-08-10 MED ORDER — PROMETHAZINE HCL 25 MG/ML IJ SOLN
25.0000 mg | Freq: Once | INTRAMUSCULAR | Status: AC
  Administered 2014-08-10: 25 mg via INTRAMUSCULAR

## 2014-08-10 NOTE — Telephone Encounter (Signed)
No restrictions or limitations from my end of things.

## 2014-08-10 NOTE — Patient Instructions (Addendum)
We will give you Toradol 30 mg and Phenergan 25 mg intramuscularly today. If you don't feel any better by tomorrow early morning, we may be able to bring you went for IV infusion with Solu-Medrol and Depacon as well as Compazine for nausea. As discussed, you have an appointment with Dr. Lucia Gaskins next week Friday at 8 AM. Please schedule an eye appointment with Dr. Burgess Estelle soon as possible

## 2014-08-10 NOTE — Progress Notes (Signed)
Pt here for appt.   IM injections given:   R upper outer quad 25mg  IM phenergan, and L upper outer quad 30mg  IM toradol.  Pt tolerated well.

## 2014-08-10 NOTE — Telephone Encounter (Signed)
Sandra [name] with [facility] is calling and need to know if paitent has been given any restrictions or limitations.  Please call [phone].  Thanks!

## 2014-08-10 NOTE — Patient Instructions (Signed)
Pt to come tomorrow for infusion if this did not help.

## 2014-08-10 NOTE — Telephone Encounter (Signed)
I called and LMVM for pt that unfortunately due to her insurance we are unable to provide out pt infusions to her at this time for Depacon.   I LMVM for her that she may need to go to ED and get treatment, may be able to get appt sooner with Dr. Lucia Gaskins. I will cancel the 0900 appt that was made previously.
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
## 2014-08-11 ENCOUNTER — Telehealth: Payer: Self-pay | Admitting: Neurology

## 2014-08-11 DIAGNOSIS — G43719 Chronic migraine without aura, intractable, without status migrainosus: Secondary | ICD-10-CM

## 2014-08-11 DIAGNOSIS — R51 Headache: Principal | ICD-10-CM

## 2014-08-11 DIAGNOSIS — G43011 Migraine without aura, intractable, with status migrainosus: Secondary | ICD-10-CM

## 2014-08-11 DIAGNOSIS — R519 Headache, unspecified: Secondary | ICD-10-CM

## 2014-08-11 DIAGNOSIS — G43001 Migraine without aura, not intractable, with status migrainosus: Secondary | ICD-10-CM

## 2014-08-11 MED ORDER — MECLIZINE HCL 25 MG PO TABS: 25.0000 mg | ORAL_TABLET | Freq: Three times a day (TID) | ORAL | Status: AC | PRN

## 2014-08-11 MED ORDER — CYCLOBENZAPRINE HCL 10 MG PO TABS: 10.0000 mg | ORAL_TABLET | Freq: Every day | ORAL | Status: DC

## 2014-08-11 MED ORDER — PROMETHAZINE HCL 25 MG PO TABS: 25.0000 mg | ORAL_TABLET | Freq: Three times a day (TID) | ORAL | Status: DC | PRN

## 2014-08-11 MED ORDER — METHYLPREDNISOLONE 4 MG PO TBPK: ORAL_TABLET | ORAL | Status: DC

## 2014-08-11 NOTE — Telephone Encounter (Signed)
Okay to refill Flexeril and Phenergan.

## 2014-08-11 NOTE — Telephone Encounter (Signed)
Called and spoke to [place] relayed Dr. Teofilo Pod advise Verlon Au understood process.

## 2014-08-11 NOTE — Telephone Encounter (Signed)
Patient called stating Medicaid for the infusion today. She would like to discuss another option. She is also requesting a refill for promethazine (PHENERGAN) 25 MG tablet and cyclobenzaprine (FLEXERIL) 10 MG tablet . Please call and advise. Patient can be reached at [phone].

## 2014-08-11 NOTE — Telephone Encounter (Signed)
Patient is requesting a Rx for Flexeril (Last prescribed 04/24/14; Lelon Mast noted only 3 refills would be provided, and Botox appt ws needed) and Phenergan (last prescribed April 2015).  Okay to send refills?  She has an appt next week with Dr Lucia Gaskins.    I tried to call the patient back to clarify first portion of this message regarding infusion.  (Got no answer).  There is a previous note from yesterday regarding out pt infusion restrictions through Medicaid.

## 2014-08-11 NOTE — Telephone Encounter (Signed)
Rx's have been sent.  I called back to advise.  Got no answer.  Left message.

## 2014-08-11 NOTE — Telephone Encounter (Signed)
She called due to worsening headache going on for4 weeks straight. Getting worse and she is getting sick with nausea. Was seen in the office this week but not improving. She is getting dizzy. Headache is pounding, worsening, she is in tears.   She asked for meclizine for the dizziness. I will prescribe meclizine and will also try a medrol dosepak. She has taken prednisone in the past and tolerated it.   I highly encouraged her to go to the emergency room. I advised her that in rare cases, migraines can be associated with stroke and other severe and deadly conditions. Therefore severe, persistent or worsening headaches or migraines should always be evaluated emergently. I asked her to go asap.

## 2014-08-14 NOTE — Telephone Encounter (Signed)
Thank you and agree.

## 2014-08-18 ENCOUNTER — Encounter: Payer: Self-pay | Admitting: Neurology

## 2014-08-18 ENCOUNTER — Ambulatory Visit (INDEPENDENT_AMBULATORY_CARE_PROVIDER_SITE_OTHER): Payer: Medicaid Other | Admitting: Neurology

## 2014-08-18 VITALS — BP 123/80 | HR 94 | Ht 67.0 in | Wt 251.4 lb

## 2014-08-18 DIAGNOSIS — H9313 Tinnitus, bilateral: Secondary | ICD-10-CM | POA: Diagnosis not present

## 2014-08-18 DIAGNOSIS — H538 Other visual disturbances: Secondary | ICD-10-CM

## 2014-08-18 DIAGNOSIS — R51 Headache: Secondary | ICD-10-CM | POA: Diagnosis not present

## 2014-08-18 DIAGNOSIS — H532 Diplopia: Secondary | ICD-10-CM

## 2014-08-18 DIAGNOSIS — H93A3 Pulsatile tinnitus, bilateral: Secondary | ICD-10-CM

## 2014-08-18 DIAGNOSIS — R519 Headache, unspecified: Secondary | ICD-10-CM

## 2014-08-18 DIAGNOSIS — G932 Benign intracranial hypertension: Secondary | ICD-10-CM | POA: Diagnosis not present

## 2014-08-18 NOTE — Progress Notes (Addendum)
[facility]    Provider:  Dr Lucia Gaskins Referring Provider: Ignatius Specking., MD Primary Care Physician:  Ignatius Specking., MD   HPI:  Sandra [name] is a 39 y.o. female here as a referral from Dr. Sherril Croon for IIH and migraines.   Addendum 09/01/2014: MRI of the brain unchanged from 2014, still c/w IIH. She is non-compliant with her Acetazolamide. She has not picked up a prescription since March and acetazolamide levels today confirm she is not taking it. Will defer to Dr. Frances Furbish when she gets back to the office to discuss non-compliance with patient. I have called multiple times, keep get answering machine. Patient is putting herself at risk for permanent vision loss. Patient has not been forthcoming with letting us know she is not taking her medication, she reported taking her medication multiple times even when questioned. And she keeps reporting non-intractable headaches. I have canceled her lumbar puncture on Monday. Her opening pressure will likely be elevated given MRI results and her medication non compliance. will defer to Dr. Frances Furbish for future management. .  Sandra [name] is a 39 year old right-handed woman with an underlying medical history of reflux disease, Fibromyalgia, anxiety, depression, hypertension, duodenal ulcer, and migraine headaches, who presents for followup consultation of her recurrent headaches including migraine headaches and history of pseudotumor cerebri. She is taking Diamox. She takes percocet as needed. She takes flexeril and Valium for LBP.   She endorses compliance with Diamox, never missing it and not having side effects.  She took the medrol dosepak, didn't help, she got blisters and she has a rash. The last day was 2 days ago. She had thrush and "blew up"   Her head hurts bilaterally with pressure. All over the head but most times the pressure is in the occipital area. It is tender to the touch with pressure, pushing on it hurts. Sometimes it feels better with  pressure on it, like if something was wrapped around it. She has light sensitivity, she gets nauseated and sick and vomiting. Headache level today [place] 8-9/10. She is having blurry vision, she is seeing dots. It is pulsating and throbbing. She can't tell the difference between the migraines and the IIH headache. She has had a headache for the last 4 weeks continuous, nothing helping it. The headache is severe right now and she says she could cry (but she is in no acute distress and vitals are normal). Laying down makes it worse. It has been a year since seeing the eye doctor. She is taking the diamox  twice daily. No side effects, she has been taking it awhile. Migraine cocktails have not helped. She hears pulsing in her ears. Her whole heads like it is going to explode. Pulsating, throbbing. More on the left side than the right but spreads to both. She is getting dizzy and nauseated. She is having transient blurry vision.   Botox in the past helped. Also discussed sphenocath.   Reviewed notes, labs and imaging from outside physicians, which showed: LP in 2014 showed openig pressure of 42.   MRi cervical spine: 03/2013  Unremarkable MRI cervical spine (with and without). Minimal disc bulging at C4-5, C5-6, C6-7. No spinal stenosis or foraminal narrowing. No intrinsic or compressive spinal cord lesions.  CT head:01/2014 Ct showed No acute intracranial abnormalities including mass lesion or mass effect, hydrocephalus, extra-axial fluid collection, midline shift, hemorrhage, or acute infarction, large ischemic events (personally reviewed images)  MRA head 03/2013: normal MRV head 03/2013: Equivocal MRV head (without)  demonstrating: 1. Mild decreased flow signal near the transitions of the bilateral transverse sinuses into the bilateral sigmoid sinuses. May represent venous sinus stenoses, which have been described in association with idiopathic intracranial hypertension. (Review of an article from  2003: "Substantial bilateral sinovenous stenoses were seen in 27 of 29 patients with IIH and in only 4 of 59 control patients." Idiopathic intracranial hypertension: The prevalence and morphology of sinovenous stenosis. Neurology 2003;60;1418-1424. Metro Kung, ISharlene Motts, et al.) 2. No evidence of venous sinus thrombosis.  Personally reviewed all images  Review of Systems: Patient complains of symptoms per HPI as well as the following symptoms: Fatigue, blurred vision, easy bruising, joint pain, joint swelling, aching muscles, IBS, allergies, headache, dizziness, depression, anxiety, insomnia. Pertinent negatives per HPI. All others negative.   History   Social History  . Marital Status: Married    Spouse Name: Jobey  . Number of Children: 2  . Years of Education: 12   Occupational History  .      [employer]   Social History Main Topics  . Smoking status: Never Smoker   . Smokeless tobacco: Never Used  . Alcohol Use: No  . Drug Use: No  . Sexual Activity: Not on file   Other Topics Concern  . Not on file   Social History Narrative   Patient lives at home with her children and she is se prated.   Patient is out of work at this time.   Education one year of college.   Caffeine one cup of sweet daily.       Family History  Problem Relation Age of Onset  . Kidney cancer Mother   . Diabetes Mother   . Prostate cancer Maternal Grandfather   . Ulcers Father   . Coronary artery disease Father     pci at age 63  . Pancreatic cancer Maternal Grandmother   . Lung cancer Paternal Grandmother     metastatic  . Aortic aneurysm      Past Medical History  Diagnosis Date  . Fibromyalgia   . Depression   . Anxiety   . Hypertension   . Migraines   . Chest pain     RHC/LHC in 2007 with normal PCWP and PA pressure, normal coronaries EF 55%  . Dyspnea   . POTS (postural orthostatic tachycardia syndrome)     possible, episodes of sinus tachycardis, holter in  2007 with sinus tachycardia with rates as high as 160  . Ulcer     duodenal in 1993  . GERD (gastroesophageal reflux disease)     with esophageal stricture  . Complication of anesthesia   . PONV (postoperative nausea and vomiting)     usually phenergan helps  . History of hiatal hernia     stomach ulcer ==  had one at age 31  . Esophageal dilatation     has had them 4-5 times already  . Endometriosis   . Vertigo   . S/P Botox injection     for migraines    Past Surgical History  Procedure Laterality Date  . Cholecystectomy    . Bladder surgery      bladder tact  . Tubal ligation    . Abdominal hysterectomy    . Cardiac catheterization    . Esophageal dilation    . Cystoscopy w/ ureteral stent removal      Dr. Nechama Guard in [place]  . Fluid removal      from  brain twice.  last time 8-9 mths ago @ [facility]  . Lumbar laminectomy/decompression microdiscectomy Left 01/11/2014    Procedure: LUMBAR LAMINECTOMY/DECOMPRESSION MICRODISCECTOMY 1 LEVEL LEFT LUMBAR TWO/THREE;  Surgeon: Tressie Stalker, MD;  Location: [facility];  Service: Neurosurgery;  Laterality: Left;    Current Outpatient Prescriptions  Medication Sig Dispense Refill  . acetaZOLAMIDE (DIAMOX) 500 MG capsule Take 2 capsules (1,000 mg total) by mouth 2 (two) times daily. 120 capsule 5  . baclofen (LIORESAL) 10 MG tablet Take 10 mg by mouth 3 (three) times daily. As needed    . cyclobenzaprine (FLEXERIL) 10 MG tablet Take 1 tablet (10 mg total) by mouth at bedtime. 30 tablet 0  . diazepam (VALIUM) 5 MG tablet Take 1 tablet (5 mg total) by mouth every 6 (six) hours as needed for muscle spasms. 50 tablet 1  . doxepin (SINEQUAN) 10 MG capsule Take 30 mg by mouth at bedtime.    Marland Kitchen FLECTOR 1.3 % PTCH As needed  0  . fluconazole (DIFLUCAN) 100 MG tablet   0  . FLUoxetine (PROZAC) 40 MG capsule Take 40 mg by mouth daily.    Marland Kitchen HYDROmorphone (DILAUDID) 4 MG tablet Take 1 tablet (4 mg total) by mouth every 4 (four) hours as  needed for severe pain. 100 tablet 0  . ibuprofen (ADVIL,MOTRIN) 800 MG tablet TAKE ONE TABLET BY MOUTH EVERY 8 HOURS AS NEEDED 90 tablet 3  . Linaclotide (LINZESS) 145 MCG CAPS capsule Take 145 mcg by mouth daily as needed (for constipation).    . meclizine (ANTIVERT) 25 MG tablet Take 1 tablet (25 mg total) by mouth 3 (three) times daily as needed for dizziness. 30 tablet 1  . methylPREDNISolone (MEDROL DOSEPAK) 4 MG TBPK tablet follow package directions 21 tablet 0  . OVER THE COUNTER MEDICATION Take 1 tablet by mouth daily. Stool softener    . pantoprazole (PROTONIX) 40 MG tablet Take 40 mg by mouth daily.     . promethazine (PHENERGAN) 25 MG tablet Take 1 tablet (25 mg total) by mouth every 8 (eight) hours as needed for nausea or vomiting. 30 tablet 0  . rizatriptan (MAXALT-MLT) 5 MG disintegrating tablet Take 5 mg by mouth daily as needed for migraine. May repeat in 2 hours if needed     Current Facility-Administered Medications  Medication Dose Route Frequency Provider Last Rate Last Dose  . methylPREDNISolone sodium succinate (SOLU-MEDROL) 250 mg in sodium chloride 0.9 % 100 mL IVPB  250 mg Intravenous Continuous Huston Foley, MD   Stopped at 04/04/14 1116  . valproate (DEPACON) 500 mg in sodium chloride 0.9 % 100 mL IVPB  500 mg Intravenous Continuous Huston Foley, MD 105 mL/hr at 04/04/14 1046 500 mg at 04/04/14 1046    Allergies as of 08/18/2014 - Review Complete 08/10/2014  Allergen Reaction Noted  . Penicillins Hives and Swelling 01/05/2014  . Topamax [topiramate]  01/10/2014  . Versed [midazolam] Itching 02/03/2014  . Ambien [zolpidem tartrate] Rash and Other (See Comments) 01/27/2013  . Hydrocodone-acetaminophen Itching   . Zolpidem tartrate Itching and Rash     Vitals: There were no vitals taken for this visit. Last Weight:  Wt Readings from Last 1 Encounters:  08/10/14 243 lb (110.224 kg)   Last Height:   Ht Readings from Last 1 Encounters:  08/10/14 5\' 6"  (1.676 m)      Physical exam: Exam: Gen: NAD, conversant, well nourised, obese, well groomed  Eyes: Conjunctivae clear without exudates or hemorrhage  Speech:    Speech is normal; fluent and spontaneous with normal comprehension.  Cognition:    The patient is oriented to person, place, and time;     recent and remote memory intact;     language fluent;     normal attention, concentration,     fund of knowledge Cranial Nerves:    The pupils are equal, round, and reactive to light. The fundi with mild bilateral fundoscopic edema that could be chronic in nature. Visual fields are full to finger confrontation. Extraocular movements are intact. Trigeminal sensation is intact and the muscles of mastication are normal. The face is symmetric. The palate elevates in the midline. Hearing intact. Voice is normal. Shoulder shrug is normal. The tongue has normal motion without fasciculations.   Motor Observation:    No asymmetry, no atrophy, and no involuntary movements noted. Tone:    Normal muscle tone.    Posture:    Posture is normal. normal erect    Strength:    Strength is V/V in the upper and lower limbs.         Assessment/Plan:  Sandra [name] is a 39 year old right-handed woman with an underlying medical history of reflux disease, Fibromyalgia, anxiety, depression, hypertension, duodenal ulcer, and migraine headaches, who presents for followup consultation of her recurrent refractor severe (per patient report) headaches including migraine headaches and history of pseudotumor cerebri. She describes 8 out of 10 refractory headache today with severe photophobia however she appears very comfortable, in no acute distress, and easily tolerates funduscopic exam. I feel there may be a level of exaggeration.   I called her pharmacy and she last filled her diamox 3 months ago 05/23/2014. She does not have any refills left and has not asked for refills. Unclear if she is as compliant as she says.  Will order diamox levels. Noncompliance can cause permanent vision loss in IIH. Patient may be placing herself at risk. Will order MRI of the brain and repeat LP for opening pressure. Will refer patient back to Dr. Frances Furbish.  Addendum 09/01/2014: MRI of the brain unchanged from 2014, still c/w IIH. She is non-compliant with her Acetazolamide. She has not picked up a prescription since March and acetazolamide levels today confirm she is not taking it. Will defer to Dr. Frances Furbish when she gets back to the office to discuss non-compliance with patient. I have called multiple times, keep get answering machine. Patient is putting herself at risk for permanent vision loss. Patient has not been forthcoming with letting us know she is not taking her medication, she reported taking her medication multiple times even when questioned. And she keeps reporting non-intractable headaches. I have canceled her lumbar puncture on Monday. Her opening pressure will likely be elevated given MRI results and her medication non compliance. will defer to Dr. Frances Furbish for future management. Naomie Dean, MD  [facility] [address] [place], [place] [address]  Phone [phone] Fax [phone]  A total of 45 minutes was spent face-to-face with this patient. Over half this time was spent on counseling patient on the migraine and IIH diagnosis and different diagnostic and therapeutic options available.

## 2014-08-18 NOTE — Patient Instructions (Signed)
Overall you are doing fairly well but I do want to suggest a few things today:   Remember to drink plenty of fluid, eat healthy meals and do not skip any meals. Try to eat protein with a every meal and eat a healthy snack such as fruit or nuts in between meals. Try to keep a regular sleep-wake schedule and try to exercise daily, particularly in the form of walking, 20-30 minutes a day, if you can.   As far as your medications are concerned, I would like to suggest: continue current meds, will request botox  As far as diagnostic testing: MRI of the brain and eyes, labs today, consider repeat lumbar puncture  I would like to see you back after testing, sooner if we need to. Please call us with any interim questions, concerns, problems, updates or refill requests.   Please also call us for any test results so we can go over those with you on the phone.  My clinical assistant and will answer any of your questions and relay your messages to me and also relay most of my messages to you.   Our phone number is [phone]. We also have an after hours call service for urgent matters and there is a physician on-call for urgent questions. For any emergencies you know to call 911 or go to the nearest emergency room

## 2014-08-23 ENCOUNTER — Ambulatory Visit (HOSPITAL_COMMUNITY)
Admission: RE | Admit: 2014-08-23 | Discharge: 2014-08-23 | Disposition: A | Discharge status: Home / Self Care | Payer: Medicaid Other | Source: Ambulatory Visit | Attending: Internal Medicine | Admitting: Internal Medicine

## 2014-08-23 DIAGNOSIS — Z1231 Encounter for screening mammogram for malignant neoplasm of breast: Secondary | ICD-10-CM | POA: Diagnosis not present

## 2014-08-29 ENCOUNTER — Other Ambulatory Visit: Payer: Self-pay

## 2014-08-29 ENCOUNTER — Other Ambulatory Visit: Payer: Self-pay | Admitting: Internal Medicine

## 2014-08-29 ENCOUNTER — Telehealth: Payer: Self-pay | Admitting: Neurology

## 2014-08-29 ENCOUNTER — Encounter (INDEPENDENT_AMBULATORY_CARE_PROVIDER_SITE_OTHER): Payer: Medicaid Other | Admitting: Diagnostic Neuroimaging

## 2014-08-29 ENCOUNTER — Ambulatory Visit
Admission: RE | Admit: 2014-08-29 | Discharge: 2014-08-29 | Disposition: A | Discharge status: Home / Self Care | Payer: Medicaid Other | Source: Ambulatory Visit | Attending: Neurology | Admitting: Neurology

## 2014-08-29 DIAGNOSIS — R928 Other abnormal and inconclusive findings on diagnostic imaging of breast: Secondary | ICD-10-CM

## 2014-08-29 DIAGNOSIS — G932 Benign intracranial hypertension: Secondary | ICD-10-CM

## 2014-08-29 DIAGNOSIS — H538 Other visual disturbances: Secondary | ICD-10-CM

## 2014-08-29 DIAGNOSIS — R51 Headache: Secondary | ICD-10-CM

## 2014-08-29 DIAGNOSIS — H93A3 Pulsatile tinnitus, bilateral: Secondary | ICD-10-CM

## 2014-08-29 DIAGNOSIS — H532 Diplopia: Secondary | ICD-10-CM

## 2014-08-29 DIAGNOSIS — R519 Headache, unspecified: Secondary | ICD-10-CM

## 2014-08-29 MED ORDER — GADOBENATE DIMEGLUMINE 529 MG/ML IV SOLN
19.0000 mL | Freq: Once | INTRAVENOUS | Status: AC | PRN
Start: 2014-08-29 — End: 2014-08-29
  Administered 2014-08-29: 19 mL via INTRAVENOUS

## 2014-08-29 NOTE — Telephone Encounter (Signed)
Patient is calling to see if a referral for a lumbar puncture referral was sent out. Please call.

## 2014-08-29 NOTE — Telephone Encounter (Signed)
Left VM for pt to call back. Was returning pt call. Gave [facility] phone number and told her we are open until 5pm.

## 2014-08-30 ENCOUNTER — Other Ambulatory Visit: Payer: Self-pay | Admitting: Neurology

## 2014-08-30 ENCOUNTER — Telehealth: Payer: Self-pay | Admitting: Neurology

## 2014-08-30 DIAGNOSIS — G932 Benign intracranial hypertension: Secondary | ICD-10-CM

## 2014-08-30 NOTE — Telephone Encounter (Signed)
Dr. Lucia GaskinsAhern- [facility] and [facility] is going to [facility] and get pt scheduled. Thank you!

## 2014-08-30 NOTE — Telephone Encounter (Signed)
Spoke with pt to let her know [facility] Victorino Dike(Jennifer) will be calling to schedule LP. Pt verbalized understanding. She said she has been throwing up half the day and her head hurts really bad. Advised her to go to ED or urgent care because our office is currently closed. She stated she "probably will go". Told her I would let Dr. Lucia GaskinsAhern know.

## 2014-08-30 NOTE — Telephone Encounter (Signed)
Sandra [name], can you get her scheduled for LP? Order has been placed thanks.

## 2014-08-30 NOTE — Telephone Encounter (Signed)
Tried calling [facility]. Line busy and will try again later.

## 2014-08-30 NOTE — Telephone Encounter (Signed)
Thank you. I will cc Dr. Frances FurbishAthar. Patient states she has been taking Acetazolamide 500mg  twice daily instead of 1000mg  twice daily as prescribed which is why she has not needed to pick up a prescription since March. Still, she would have run out by now even at the lower dose. She may be noncompliant with her acetazolamide but still awaiting serum drug levels. If levels are low, will discuss compliance again with patient but maybe she does have extra pills from previous prescriptions that she is taking. Will follow until Dr. Frances FurbishAthar returns to the office thanks.

## 2014-08-30 NOTE — Telephone Encounter (Signed)
She is taking acetazolamide 500mg  twice daily. She is still having head pain. She hasn't filled her acetazolamide since March but says this is because she had a lot of extra pills. Went to the ophthalmologist and says she still has pressure behind the eyes and is

## 2014-08-30 NOTE — Telephone Encounter (Signed)
Spoke with Sandra [name] earlier today and told her pt needs to be scheduled for LP. Sandra [name] stated she wall call pt.

## 2014-08-30 NOTE — Telephone Encounter (Signed)
Getting visual field testing completed soon. She was prescribed 1000mg  bid acetazolamide but reports Dr. Frances FurbishAthar decreased it to 500mg  bid and she has a lot of extra pills so hasn;t needed a refill. Acetazolamide level pending. Will order lumbar puncture for opening pressure. MRi of the brain completed and pending. Dr. Frances FurbishAthar is not in the office, will manage patient until she returns.

## 2014-08-31 ENCOUNTER — Other Ambulatory Visit: Payer: Self-pay | Admitting: Neurology

## 2014-08-31 NOTE — Telephone Encounter (Signed)
Patient has been calling reporting headache again. Tried to call patient to discuss the MRI of the brain which c/w IIH. Spoke to the pharmacy. The last 3 times she has filled her acetazolamide prescriptions have been: on 3/29 and previous to that on 2/25 and before that 01/17/2014. I will discuss compliance with her and risk she is putting herself in if she is not following treatment plans for her IIH. Still pending acetazolamide level.

## 2014-09-01 ENCOUNTER — Telehealth: Payer: Self-pay | Admitting: Neurology

## 2014-09-01 LAB — COMPREHENSIVE METABOLIC PANEL
A/G RATIO: 1.3 (ref 1.1–2.5)
ALK PHOS: 117 IU/L (ref 39–117)
ALT: 35 IU/L — ABNORMAL HIGH (ref 0–32)
AST: 31 IU/L (ref 0–40)
Albumin: 3.7 g/dL (ref 3.5–5.5)
BUN / CREAT RATIO: 16 (ref 8–20)
BUN: 17 mg/dL (ref 6–20)
Bilirubin Total: 0.3 mg/dL (ref 0.0–1.2)
CHLORIDE: 100 mmol/L (ref 97–108)
CO2: 22 mmol/L (ref 18–29)
CREATININE: 1.07 mg/dL — AB (ref 0.57–1.00)
Calcium: 9 mg/dL (ref 8.7–10.2)
GFR calc non Af Amer: 66 mL/min/{1.73_m2} (ref >59)
GFR, EST AFRICAN AMERICAN: 76 mL/min/{1.73_m2} (ref >59)
GLOBULIN, TOTAL: 2.8 g/dL (ref 1.5–4.5)
Glucose: 89 mg/dL (ref 65–99)
Potassium: 4.6 mmol/L (ref 3.5–5.2)
Sodium: 139 mmol/L (ref 134–144)
Total Protein: 6.5 g/dL (ref 6.0–8.5)

## 2014-09-01 LAB — ACETAZOLAMIDE, (DIAMOX), S/P

## 2014-09-01 LAB — CBC
Hematocrit: 42.1 % (ref 34.0–46.6)
Hemoglobin: 13.8 g/dL (ref 11.1–15.9)
MCH: 30.5 pg (ref 26.6–33.0)
MCHC: 32.8 g/dL (ref 31.5–35.7)
MCV: 93 fL (ref 79–97)
Platelets: 208 10*3/uL (ref 150–379)
RBC: 4.52 x10E6/uL (ref 3.77–5.28)
RDW: 13.6 % (ref 12.3–15.4)
WBC: 6.8 10*3/uL (ref 3.4–10.8)

## 2014-09-01 NOTE — Telephone Encounter (Signed)
Called patient to discuss MRI of the brain results. Again, she did not pick up. MRI of the brain unchanged from 2014, still c/w IIH. She is non-compliant with her Acetazolamide. She has not picked up a prescription since March and acetazolamide levels today confirm she is not taking it. Will defer to Dr. Frances FurbishAthar when she gets back to the office to discuss non-compliance with patient. I have called multiple times, keep get answering machine. Patient is putting herself at risk for permanent vision loss. Patient has not been forthcoming with letting us know she is not taking her medication, she reported taking her medication multiple times even when questioned. And she keeps reporting non-intractable headaches.   I have canceled her lumbar puncture on Monday. Her opening pressure will likely be elevated given MRI results and her medication non compliance. will defer to Dr. Frances FurbishAthar for future management. .Marland Kitchen

## 2014-09-04 ENCOUNTER — Other Ambulatory Visit: Payer: Self-pay | Admitting: Neurology

## 2014-09-04 ENCOUNTER — Other Ambulatory Visit: Payer: Medicaid Other

## 2014-09-04 DIAGNOSIS — G932 Benign intracranial hypertension: Secondary | ICD-10-CM

## 2014-09-04 DIAGNOSIS — R51 Headache: Principal | ICD-10-CM

## 2014-09-04 DIAGNOSIS — G43001 Migraine without aura, not intractable, with status migrainosus: Secondary | ICD-10-CM

## 2014-09-04 DIAGNOSIS — G43719 Chronic migraine without aura, intractable, without status migrainosus: Secondary | ICD-10-CM

## 2014-09-04 DIAGNOSIS — R519 Headache, unspecified: Secondary | ICD-10-CM

## 2014-09-04 MED ORDER — PROMETHAZINE HCL 25 MG PO TABS: 25.0000 mg | ORAL_TABLET | Freq: Three times a day (TID) | ORAL | Status: DC | PRN

## 2014-09-04 MED ORDER — ACETAZOLAMIDE ER 500 MG PO CP12
1000.0000 mg | ORAL_CAPSULE | Freq: Two times a day (BID) | ORAL | Status: DC
Start: 2014-09-04 — End: 2017-07-15

## 2014-09-04 NOTE — Telephone Encounter (Signed)
Patient called c/o headaches again. I refilled her medications for her. She insists she was taking her medications despite  acetazolamide levels that show noncompliance.   I refilled her Diamox. I counseled patient on compliance, IIH can cause permanent vision loss. She could follow up with Dr. Frances FurbishAthar.

## 2014-09-04 NOTE — Telephone Encounter (Signed)
Patient is calling to discuss MRI results. Please call.

## 2014-09-05 ENCOUNTER — Ambulatory Visit (HOSPITAL_COMMUNITY)
Admission: RE | Admit: 2014-09-05 | Discharge: 2014-09-05 | Disposition: A | Discharge status: Home / Self Care | Payer: Medicaid Other | Source: Ambulatory Visit | Attending: Internal Medicine | Admitting: Internal Medicine

## 2014-09-05 DIAGNOSIS — R928 Other abnormal and inconclusive findings on diagnostic imaging of breast: Secondary | ICD-10-CM | POA: Diagnosis present

## 2014-09-06 ENCOUNTER — Other Ambulatory Visit: Payer: Medicaid Other

## 2014-10-24 ENCOUNTER — Telehealth: Payer: Self-pay | Admitting: Neurology

## 2014-10-24 NOTE — Telephone Encounter (Signed)
I spoke to Dr. Frances Furbish. She does not feel that Sandra [name]'s condition requires longer term disability, therefore is uncomfortable in filling out disability ppw. Dr. Frances Furbish is more than happy to see patient in office visit to discuss her health. I will call patient to let her know and schedule appt.

## 2014-10-24 NOTE — Telephone Encounter (Signed)
I spoke to patient to advise her of message that I received below. Patient stated that it was ok for [place] to discuss her health with [employer]. Sandra [name] reports that she is having frequent headaches and dizziness. She asked if she should come in to discuss with Dr. Frances Furbish about long term disability. I said that I would ask to see how Dr Frances Furbish would like to proceed and call her back.

## 2014-10-24 NOTE — Telephone Encounter (Signed)
I spoke to patient and gave her below information. We were also able to make her an appt for tomorrow.

## 2014-10-24 NOTE — Telephone Encounter (Signed)
Leslie/[employer] [phone] ext [phone] called regarding long term disability, limitations and restrictions. Also inquiring as to whether or not patient had MRI or not around the end of June or 1st part of July.

## 2014-10-24 NOTE — Telephone Encounter (Signed)
I tried calling [employer] but Verlon Au was out of the office.

## 2014-10-25 ENCOUNTER — Ambulatory Visit (INDEPENDENT_AMBULATORY_CARE_PROVIDER_SITE_OTHER): Payer: Medicaid Other | Admitting: Neurology

## 2014-10-25 ENCOUNTER — Encounter: Payer: Self-pay | Admitting: Neurology

## 2014-10-25 VITALS — BP 130/88 | HR 72 | Resp 16 | Ht 67.0 in | Wt 244.0 lb

## 2014-10-25 DIAGNOSIS — R51 Headache: Secondary | ICD-10-CM | POA: Diagnosis not present

## 2014-10-25 DIAGNOSIS — R519 Headache, unspecified: Secondary | ICD-10-CM

## 2014-10-25 DIAGNOSIS — G932 Benign intracranial hypertension: Secondary | ICD-10-CM

## 2014-10-25 DIAGNOSIS — E669 Obesity, unspecified: Secondary | ICD-10-CM | POA: Diagnosis not present
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
## 2014-10-25 NOTE — Patient Instructions (Signed)
We will schedule you for another lumbar puncture for concern for flare up of your pseudotumor cerebri.  We will call you with the results.  Continue with the Diamox, do not skip it.  We may consider a referral to [facility] or [facility] next, potentially for shunt placement if needed.  Please have Dr. Burgess Estelle send me her recent records.

## 2014-10-26 ENCOUNTER — Telehealth: Payer: Self-pay

## 2014-10-26 NOTE — Telephone Encounter (Signed)
I called [facility] to get patient scheduled for LP under [facility]. I left a message for spinal dept to call back. I faxed order and patient information to [facility] (this information is on my desk currently). I will wait for call back or faxed confirmation of appt.

## 2014-10-26 NOTE — Telephone Encounter (Signed)
[facility] called back and patient is scheduled 11/03/14 at 8 am

## 2014-11-03 ENCOUNTER — Other Ambulatory Visit: Payer: Medicaid Other

## 2014-11-03 ENCOUNTER — Other Ambulatory Visit: Payer: Self-pay | Admitting: Neurology

## 2014-11-03 ENCOUNTER — Ambulatory Visit
Admission: RE | Admit: 2014-11-03 | Discharge: 2014-11-03 | Disposition: A | Discharge status: Home / Self Care | Payer: Medicaid Other | Source: Ambulatory Visit | Attending: Neurology | Admitting: Neurology

## 2014-11-03 DIAGNOSIS — R51 Headache: Secondary | ICD-10-CM

## 2014-11-03 DIAGNOSIS — E669 Obesity, unspecified: Secondary | ICD-10-CM

## 2014-11-03 DIAGNOSIS — R519 Headache, unspecified: Secondary | ICD-10-CM

## 2014-11-03 DIAGNOSIS — G932 Benign intracranial hypertension: Secondary | ICD-10-CM

## 2014-11-03 LAB — CSF CELL COUNT WITH DIFFERENTIAL
RBC COUNT CSF: 40 uL — AB
TUBE #: 3
WBC, CSF: 2 cu mm (ref 0–5)

## 2014-11-03 LAB — GRAM STAIN: GRAM STAIN: NONE SEEN

## 2014-11-03 LAB — PROTEIN, CSF: Total Protein, CSF: 43 mg/dL (ref 15–45)

## 2014-11-03 LAB — GLUCOSE, CSF: GLUCOSE CSF: 61 mg/dL (ref 43–76)

## 2014-11-03 NOTE — Discharge Instructions (Signed)

## 2014-11-03 NOTE — Progress Notes (Signed)
Quick Note:  Please call patient: Lumbar puncture from 11/03/2014 showed a very mildly elevated opening pressure of 21 cm. After fluid was removed, pressure was normalized. Routine test results on the spinal fluid are pending and we will update her. Please remind her to continue her Diamox as previously prescribed.  Huston Foley, MD, PhD [facility] ([facility]  ______

## 2014-11-06 ENCOUNTER — Telehealth: Payer: Self-pay

## 2014-11-06 DIAGNOSIS — Z0271 Encounter for disability determination: Secondary | ICD-10-CM

## 2014-11-06 NOTE — Telephone Encounter (Signed)
-----   Message from Huston Foley, MD sent at 11/03/2014 11:47 AM EDT ----- Please call patient: Lumbar puncture from 11/03/2014 showed a very mildly elevated opening pressure of 21 cm. After fluid was removed, pressure was normalized. Routine test results on the spinal fluid are pending and we will update her. Please remind her to continue her Diamox as previously prescribed.  Huston Foley, MD, PhD [facility])

## 2014-11-06 NOTE — Telephone Encounter (Signed)
I spoke to patient and she is aware of results and will wait to hear about further testing.

## 2015-01-25 DIAGNOSIS — Z0271 Encounter for disability determination: Secondary | ICD-10-CM

## 2015-03-01 ENCOUNTER — Ambulatory Visit: Payer: Medicaid Other | Admitting: Neurology

## 2015-03-08 ENCOUNTER — Telehealth: Payer: Self-pay

## 2015-03-08 ENCOUNTER — Ambulatory Visit: Payer: Medicaid Other | Admitting: Neurology

## 2015-03-08 NOTE — Telephone Encounter (Signed)
Patient did not show to appt today  

## 2015-03-12 ENCOUNTER — Encounter: Payer: Self-pay | Admitting: Neurology

## 2015-03-16 ENCOUNTER — Other Ambulatory Visit: Payer: Self-pay | Admitting: Neurology

## 2015-03-18 ENCOUNTER — Other Ambulatory Visit: Payer: Self-pay

## 2015-03-18 DIAGNOSIS — G43011 Migraine without aura, intractable, with status migrainosus: Secondary | ICD-10-CM

## 2015-04-03 ENCOUNTER — Other Ambulatory Visit: Payer: Self-pay | Admitting: Neurology

## 2015-04-03 DIAGNOSIS — A084 Viral intestinal infection, unspecified: Principal | ICD-10-CM | POA: Diagnosis present

## 2015-04-03 DIAGNOSIS — Z833 Family history of diabetes mellitus: Secondary | ICD-10-CM

## 2015-04-03 DIAGNOSIS — Z801 Family history of malignant neoplasm of trachea, bronchus and lung: Secondary | ICD-10-CM

## 2015-04-03 DIAGNOSIS — K219 Gastro-esophageal reflux disease without esophagitis: Secondary | ICD-10-CM | POA: Diagnosis present

## 2015-04-03 DIAGNOSIS — F329 Major depressive disorder, single episode, unspecified: Secondary | ICD-10-CM | POA: Diagnosis present

## 2015-04-03 DIAGNOSIS — G8929 Other chronic pain: Secondary | ICD-10-CM | POA: Diagnosis present

## 2015-04-03 DIAGNOSIS — G932 Benign intracranial hypertension: Secondary | ICD-10-CM | POA: Diagnosis present

## 2015-04-03 DIAGNOSIS — E876 Hypokalemia: Secondary | ICD-10-CM | POA: Diagnosis present

## 2015-04-03 DIAGNOSIS — F419 Anxiety disorder, unspecified: Secondary | ICD-10-CM | POA: Diagnosis present

## 2015-04-03 DIAGNOSIS — M797 Fibromyalgia: Secondary | ICD-10-CM | POA: Diagnosis present

## 2015-04-03 DIAGNOSIS — I498 Other specified cardiac arrhythmias: Secondary | ICD-10-CM | POA: Diagnosis present

## 2015-04-03 DIAGNOSIS — E860 Dehydration: Secondary | ICD-10-CM | POA: Diagnosis present

## 2015-04-03 DIAGNOSIS — Z8249 Family history of ischemic heart disease and other diseases of the circulatory system: Secondary | ICD-10-CM

## 2015-04-03 DIAGNOSIS — R6510 Systemic inflammatory response syndrome (SIRS) of non-infectious origin without acute organ dysfunction: Secondary | ICD-10-CM | POA: Diagnosis present

## 2015-04-03 DIAGNOSIS — N179 Acute kidney failure, unspecified: Secondary | ICD-10-CM | POA: Diagnosis present

## 2015-04-03 DIAGNOSIS — I959 Hypotension, unspecified: Secondary | ICD-10-CM | POA: Diagnosis present

## 2015-04-03 DIAGNOSIS — I10 Essential (primary) hypertension: Secondary | ICD-10-CM | POA: Diagnosis present

## 2015-04-03 DIAGNOSIS — Z8051 Family history of malignant neoplasm of kidney: Secondary | ICD-10-CM

## 2015-04-03 DIAGNOSIS — Z800 Family history of malignant neoplasm of digestive organs: Secondary | ICD-10-CM

## 2015-04-03 NOTE — Telephone Encounter (Signed)
Did you want to refill this?

## 2015-04-04 ENCOUNTER — Inpatient Hospital Stay (HOSPITAL_COMMUNITY)
Admission: EM | Admit: 2015-04-04 | Discharge: 2015-04-06 | DRG: 392 | Disposition: A | Discharge status: Home / Self Care | Payer: Medicaid Other | Attending: Internal Medicine | Admitting: Internal Medicine

## 2015-04-04 ENCOUNTER — Emergency Department (HOSPITAL_COMMUNITY): Payer: Medicaid Other

## 2015-04-04 ENCOUNTER — Encounter (HOSPITAL_COMMUNITY): Payer: Self-pay | Admitting: Emergency Medicine

## 2015-04-04 ENCOUNTER — Observation Stay (HOSPITAL_COMMUNITY): Payer: Medicaid Other

## 2015-04-04 DIAGNOSIS — Z800 Family history of malignant neoplasm of digestive organs: Secondary | ICD-10-CM | POA: Diagnosis not present

## 2015-04-04 DIAGNOSIS — F419 Anxiety disorder, unspecified: Secondary | ICD-10-CM | POA: Diagnosis present

## 2015-04-04 DIAGNOSIS — R55 Syncope and collapse: Secondary | ICD-10-CM

## 2015-04-04 DIAGNOSIS — I959 Hypotension, unspecified: Secondary | ICD-10-CM | POA: Diagnosis present

## 2015-04-04 DIAGNOSIS — I10 Essential (primary) hypertension: Secondary | ICD-10-CM | POA: Diagnosis present

## 2015-04-04 DIAGNOSIS — I498 Other specified cardiac arrhythmias: Secondary | ICD-10-CM | POA: Diagnosis present

## 2015-04-04 DIAGNOSIS — R42 Dizziness and giddiness: Secondary | ICD-10-CM | POA: Diagnosis present

## 2015-04-04 DIAGNOSIS — Z833 Family history of diabetes mellitus: Secondary | ICD-10-CM | POA: Diagnosis not present

## 2015-04-04 DIAGNOSIS — R6510 Systemic inflammatory response syndrome (SIRS) of non-infectious origin without acute organ dysfunction: Secondary | ICD-10-CM | POA: Diagnosis present

## 2015-04-04 DIAGNOSIS — R079 Chest pain, unspecified: Secondary | ICD-10-CM

## 2015-04-04 DIAGNOSIS — Z8051 Family history of malignant neoplasm of kidney: Secondary | ICD-10-CM | POA: Diagnosis not present

## 2015-04-04 DIAGNOSIS — E860 Dehydration: Secondary | ICD-10-CM | POA: Diagnosis present

## 2015-04-04 DIAGNOSIS — A084 Viral intestinal infection, unspecified: Secondary | ICD-10-CM | POA: Diagnosis present

## 2015-04-04 DIAGNOSIS — G932 Benign intracranial hypertension: Secondary | ICD-10-CM | POA: Diagnosis present

## 2015-04-04 DIAGNOSIS — G43909 Migraine, unspecified, not intractable, without status migrainosus: Secondary | ICD-10-CM | POA: Insufficient documentation

## 2015-04-04 DIAGNOSIS — F329 Major depressive disorder, single episode, unspecified: Secondary | ICD-10-CM | POA: Diagnosis present

## 2015-04-04 DIAGNOSIS — N179 Acute kidney failure, unspecified: Secondary | ICD-10-CM | POA: Diagnosis present

## 2015-04-04 DIAGNOSIS — K219 Gastro-esophageal reflux disease without esophagitis: Secondary | ICD-10-CM | POA: Diagnosis present

## 2015-04-04 DIAGNOSIS — G8929 Other chronic pain: Secondary | ICD-10-CM | POA: Diagnosis present

## 2015-04-04 DIAGNOSIS — I9589 Other hypotension: Secondary | ICD-10-CM | POA: Diagnosis not present

## 2015-04-04 DIAGNOSIS — R509 Fever, unspecified: Secondary | ICD-10-CM | POA: Diagnosis not present

## 2015-04-04 DIAGNOSIS — I951 Orthostatic hypotension: Secondary | ICD-10-CM

## 2015-04-04 DIAGNOSIS — M797 Fibromyalgia: Secondary | ICD-10-CM | POA: Diagnosis present

## 2015-04-04 DIAGNOSIS — Z801 Family history of malignant neoplasm of trachea, bronchus and lung: Secondary | ICD-10-CM | POA: Diagnosis not present

## 2015-04-04 DIAGNOSIS — R000 Tachycardia, unspecified: Secondary | ICD-10-CM | POA: Diagnosis not present

## 2015-04-04 DIAGNOSIS — E876 Hypokalemia: Secondary | ICD-10-CM | POA: Diagnosis present

## 2015-04-04 DIAGNOSIS — Z8249 Family history of ischemic heart disease and other diseases of the circulatory system: Secondary | ICD-10-CM | POA: Diagnosis not present

## 2015-04-04 DIAGNOSIS — G90A Postural orthostatic tachycardia syndrome (POTS): Secondary | ICD-10-CM | POA: Diagnosis present

## 2015-04-04 LAB — CBC WITH DIFFERENTIAL/PLATELET
BASOS ABS: 0 10*3/uL (ref 0.0–0.1)
Basophils Relative: 1 %
EOS ABS: 0.2 10*3/uL (ref 0.0–0.7)
EOS PCT: 4 %
HCT: 39.1 % (ref 36.0–46.0)
HEMOGLOBIN: 12.9 g/dL (ref 12.0–15.0)
LYMPHS ABS: 1.5 10*3/uL (ref 0.7–4.0)
LYMPHS PCT: 29 %
MCH: 31.5 pg (ref 26.0–34.0)
MCHC: 33 g/dL (ref 30.0–36.0)
MCV: 95.6 fL (ref 78.0–100.0)
Monocytes Absolute: 0.2 10*3/uL (ref 0.1–1.0)
Monocytes Relative: 5 %
NEUTROS PCT: 61 %
Neutro Abs: 3.1 10*3/uL (ref 1.7–7.7)
PLATELETS: 199 10*3/uL (ref 150–400)
RBC: 4.09 MIL/uL (ref 3.87–5.11)
RDW: 12.8 % (ref 11.5–15.5)
WBC: 5 10*3/uL (ref 4.0–10.5)

## 2015-04-04 LAB — URINALYSIS, ROUTINE W REFLEX MICROSCOPIC
Glucose, UA: NEGATIVE mg/dL
LEUKOCYTES UA: NEGATIVE
NITRITE: NEGATIVE
PH: 5.5 (ref 5.0–8.0)
Protein, ur: 100 mg/dL — AB

## 2015-04-04 LAB — URINE MICROSCOPIC-ADD ON

## 2015-04-04 LAB — COMPREHENSIVE METABOLIC PANEL
ALBUMIN: 3.6 g/dL (ref 3.5–5.0)
ALT: 42 U/L (ref 14–54)
ANION GAP: 8 (ref 5–15)
AST: 66 U/L — ABNORMAL HIGH (ref 15–41)
Alkaline Phosphatase: 92 U/L (ref 38–126)
BILIRUBIN TOTAL: 0.4 mg/dL (ref 0.3–1.2)
BUN: 8 mg/dL (ref 6–20)
CHLORIDE: 107 mmol/L (ref 101–111)
CO2: 24 mmol/L (ref 22–32)
Calcium: 8.9 mg/dL (ref 8.9–10.3)
Creatinine, Ser: 1.47 mg/dL — ABNORMAL HIGH (ref 0.44–1.00)
GFR calc Af Amer: 51 mL/min — ABNORMAL LOW (ref >60)
GFR calc non Af Amer: 44 mL/min — ABNORMAL LOW (ref >60)
GLUCOSE: 146 mg/dL — AB (ref 65–99)
POTASSIUM: 3.6 mmol/L (ref 3.5–5.1)
SODIUM: 139 mmol/L (ref 135–145)
TOTAL PROTEIN: 6.8 g/dL (ref 6.5–8.1)

## 2015-04-04 LAB — INFLUENZA PANEL BY PCR (TYPE A & B)
H1N1 flu by pcr: NOT DETECTED
INFLAPCR: NEGATIVE
Influenza B By PCR: NEGATIVE

## 2015-04-04 LAB — TSH: TSH: 2.275 u[IU]/mL (ref 0.350–4.500)

## 2015-04-04 LAB — TROPONIN I: Troponin I: <0.03 ng/mL (ref <0.031)

## 2015-04-04 LAB — I-STAT CG4 LACTIC ACID, ED: Lactic Acid, Venous: 1.44 mmol/L (ref 0.5–2.0)

## 2015-04-04 LAB — VALPROIC ACID LEVEL

## 2015-04-04 LAB — MAGNESIUM: Magnesium: 2.1 mg/dL (ref 1.7–2.4)

## 2015-04-04 MED ORDER — ONDANSETRON HCL 4 MG/2ML IJ SOLN
4.0000 mg | Freq: Four times a day (QID) | INTRAMUSCULAR | Status: DC | PRN
  Administered 2015-04-05 (×2): 4 mg via INTRAVENOUS
  Filled 2015-04-04 (×3): qty 2

## 2015-04-04 MED ORDER — ACETAMINOPHEN 500 MG PO TABS
1000.0000 mg | ORAL_TABLET | Freq: Once | ORAL | Status: AC
  Administered 2015-04-04: 1000 mg via ORAL
  Filled 2015-04-04: qty 2

## 2015-04-04 MED ORDER — LEVOFLOXACIN IN D5W 750 MG/150ML IV SOLN
750.0000 mg | Freq: Once | INTRAVENOUS | Status: AC
  Administered 2015-04-04: 750 mg via INTRAVENOUS
  Filled 2015-04-04: qty 150

## 2015-04-04 MED ORDER — ACETAZOLAMIDE ER 500 MG PO CP12: 1000.0000 mg | ORAL_CAPSULE | Freq: Two times a day (BID) | ORAL | Status: DC

## 2015-04-04 MED ORDER — ENOXAPARIN SODIUM 40 MG/0.4ML ~~LOC~~ SOLN
40.0000 mg | SUBCUTANEOUS | Status: DC
  Administered 2015-04-04 – 2015-04-06 (×3): 40 mg via SUBCUTANEOUS
  Filled 2015-04-04 (×3): qty 0.4

## 2015-04-04 MED ORDER — FLUOXETINE HCL 20 MG PO CAPS
40.0000 mg | ORAL_CAPSULE | Freq: Every day | ORAL | Status: DC
  Administered 2015-04-04 – 2015-04-06 (×3): 40 mg via ORAL
  Filled 2015-04-04 (×8): qty 2

## 2015-04-04 MED ORDER — KETOROLAC TROMETHAMINE 30 MG/ML IJ SOLN
30.0000 mg | Freq: Four times a day (QID) | INTRAMUSCULAR | Status: DC | PRN
  Administered 2015-04-04 – 2015-04-06 (×4): 30 mg via INTRAVENOUS
  Filled 2015-04-04 (×5): qty 1

## 2015-04-04 MED ORDER — SODIUM CHLORIDE 0.9 % IV SOLN
INTRAVENOUS | Status: DC
  Administered 2015-04-04: 12:00:00 via INTRAVENOUS
  Administered 2015-04-05: 150 mL/h via INTRAVENOUS
  Administered 2015-04-05: 13:00:00 via INTRAVENOUS

## 2015-04-04 MED ORDER — ACETAZOLAMIDE 250 MG PO TABS
250.0000 mg | ORAL_TABLET | Freq: Four times a day (QID) | ORAL | Status: DC
  Administered 2015-04-04 – 2015-04-06 (×6): 250 mg via ORAL
  Filled 2015-04-04 (×22): qty 1

## 2015-04-04 MED ORDER — GLUCAGON HCL RDNA (DIAGNOSTIC) 1 MG IJ SOLR
1.0000 mg | Freq: Once | INTRAMUSCULAR | Status: AC
Start: 2015-04-04 — End: 2015-04-04
  Administered 2015-04-04: 1 mg via INTRAVENOUS
  Filled 2015-04-04: qty 1

## 2015-04-04 MED ORDER — SODIUM CHLORIDE 0.9 % IV SOLN
1000.0000 mL | Freq: Once | INTRAVENOUS | Status: AC
  Administered 2015-04-04: 1000 mL via INTRAVENOUS

## 2015-04-04 MED ORDER — PANTOPRAZOLE SODIUM 40 MG PO TBEC
40.0000 mg | DELAYED_RELEASE_TABLET | Freq: Every day | ORAL | Status: DC
  Administered 2015-04-04 – 2015-04-06 (×3): 40 mg via ORAL
  Filled 2015-04-04 (×3): qty 1

## 2015-04-04 MED ORDER — DIAZEPAM 5 MG PO TABS
5.0000 mg | ORAL_TABLET | Freq: Four times a day (QID) | ORAL | Status: DC | PRN
  Administered 2015-04-04 – 2015-04-05 (×2): 5 mg via ORAL
  Filled 2015-04-04 (×2): qty 1

## 2015-04-04 MED ORDER — DEXTROSE 5 % IV SOLN
2.0000 g | Freq: Once | INTRAVENOUS | Status: AC
  Administered 2015-04-04: 2 g via INTRAVENOUS

## 2015-04-04 MED ORDER — DEXTROSE 5 % IV SOLN
INTRAVENOUS | Status: AC
  Filled 2015-04-04: qty 2

## 2015-04-04 MED ORDER — SODIUM CHLORIDE 0.9% FLUSH
3.0000 mL | Freq: Two times a day (BID) | INTRAVENOUS | Status: DC
  Administered 2015-04-04 – 2015-04-05 (×3): 3 mL via INTRAVENOUS

## 2015-04-04 MED ORDER — VANCOMYCIN HCL 1000 MG IV SOLR
INTRAVENOUS | Status: AC
Start: 2015-04-04 — End: 2015-04-04
  Filled 2015-04-04: qty 2000

## 2015-04-04 MED ORDER — VANCOMYCIN HCL 10 G IV SOLR
2000.0000 mg | Freq: Once | INTRAVENOUS | Status: AC
  Administered 2015-04-04: 2000 mg via INTRAVENOUS
  Filled 2015-04-04: qty 2000

## 2015-04-04 MED ORDER — SODIUM CHLORIDE 0.9 % IV SOLN
1000.0000 mL | INTRAVENOUS | Status: DC
Start: 2015-04-04 — End: 2015-04-04

## 2015-04-04 MED ORDER — DEXTROSE 5 % IV SOLN
1.0000 g | Freq: Three times a day (TID) | INTRAVENOUS | Status: DC
  Administered 2015-04-04 – 2015-04-05 (×4): 1 g via INTRAVENOUS
  Filled 2015-04-04 (×7): qty 1

## 2015-04-04 MED ORDER — HYDROCORTISONE NA SUCCINATE PF 100 MG IJ SOLR
100.0000 mg | Freq: Three times a day (TID) | INTRAMUSCULAR | Status: AC
  Administered 2015-04-04 (×3): 100 mg via INTRAVENOUS
  Filled 2015-04-04 (×3): qty 2

## 2015-04-04 MED ORDER — VANCOMYCIN HCL IN DEXTROSE 1-5 GM/200ML-% IV SOLN
1000.0000 mg | Freq: Two times a day (BID) | INTRAVENOUS | Status: DC
  Administered 2015-04-04 – 2015-04-05 (×2): 1000 mg via INTRAVENOUS
  Filled 2015-04-04 (×2): qty 200

## 2015-04-04 NOTE — ED Provider Notes (Addendum)
CHIEF COMPLAINT: Generalized weakness, lightheadedness, chills, nausea  HPI: Pt is a 40 y.o. female with history of pseudotumor cerebri POTS, fibromyalgia, migraines, chronic pain who presents the emergency department complaints of feeling lightheaded for the past several days, weak and having chills and nausea. States that today she felt like she was "jumping" all over and having muscle spasms. Also reports chest heaviness and shortness of breath with palpitations. States that she is followed by Dr. Sherril Croon in [place] who recently started on atenolol to take intermittently for palpitations. She states that she took 25 mg at 10 PM. Previous to taking this medication her blood pressure was 195/108. In the emergency department her blood pressure is 85/52. She reports she has had one episode of vomiting. No diarrhea. No bloody stools or melena. She is status post hysterectomy and denies any vaginal bleeding. No cough. No abdominal pain. No headache currently despite nursing notes. No neck pain or neck stiffness. Denies known fever.  Last LP for pseudotumor was September 2016.    ROS: See HPI Constitutional: no fever  Eyes: no drainage  ENT: no runny nose   Cardiovascular:   chest pain  Resp:  SOB  GI:  vomiting GU: no dysuria Integumentary: no rash  Allergy: no hives  Musculoskeletal: no leg swelling  Neurological: no slurred speech ROS otherwise negative  PAST MEDICAL HISTORY/PAST SURGICAL HISTORY:  Past Medical History  Diagnosis Date  . Fibromyalgia   . Depression   . Anxiety   . Hypertension   . Migraines   . Chest pain     RHC/LHC in 2007 with normal PCWP and PA pressure, normal coronaries EF 55%  . Dyspnea   . POTS (postural orthostatic tachycardia syndrome)     possible, episodes of sinus tachycardis, holter in 2007 with sinus tachycardia with rates as high as 160  . Ulcer     duodenal in 1993  . GERD (gastroesophageal reflux disease)     with esophageal stricture  . Complication  of anesthesia   . PONV (postoperative nausea and vomiting)     usually phenergan helps  . History of hiatal hernia     stomach ulcer ==  had one at age 55  . Esophageal dilatation     has had them 4-5 times already  . Endometriosis   . Vertigo   . S/P Botox injection     for migraines    MEDICATIONS:  Prior to Admission medications   Medication Sig Start Date End Date Taking? Authorizing Provider  acetaZOLAMIDE (DIAMOX) 500 MG capsule Take 2 capsules (1,000 mg total) by mouth 2 (two) times daily. Patient taking differently: Take 2,500 mg by mouth 2 (two) times daily.  09/04/14  Yes Anson Fret, MD  FLECTOR 1.3 % [facility] As needed 11/14/13  Yes Historical Provider, MD  FLUoxetine (PROZAC) 40 MG capsule Take 40 mg by mouth daily.   Yes Historical Provider, MD  HYDROmorphone (DILAUDID) 4 MG tablet Take 1 tablet (4 mg total) by mouth every 4 (four) hours as needed for severe pain. 01/12/14  Yes Tressie Stalker, MD  ibuprofen (ADVIL,MOTRIN) 800 MG tablet TAKE ONE TABLET BY MOUTH EVERY 8 HOURS AS NEEDED 04/24/14  Yes Huston Foley, MD  meclizine (ANTIVERT) 25 MG tablet Take 1 tablet (25 mg total) by mouth 3 (three) times daily as needed for dizziness. 08/11/14  Yes Anson Fret, MD  OVER THE COUNTER MEDICATION Take 1 tablet by mouth daily. Stool softener   Yes Historical Provider, MD  oxyCODONE-acetaminophen (PERCOCET) 10-325 MG per tablet Take 1 tablet by mouth every 4 (four) hours as needed for pain.   Yes Historical Provider, MD  pantoprazole (PROTONIX) 40 MG tablet Take 40 mg by mouth daily.  08/13/12  Yes Historical Provider, MD  promethazine (PHENERGAN) 25 MG tablet Take 1 tablet (25 mg total) by mouth every 8 (eight) hours as needed for nausea or vomiting. 09/04/14  Yes Anson Fret, MD  rizatriptan (MAXALT-MLT) 5 MG disintegrating tablet Take 5 mg by mouth daily as needed for migraine. May repeat in 2 hours if needed   Yes Historical Provider, MD  cyclobenzaprine (FLEXERIL) 10 MG tablet  Take 1 tablet (10 mg total) by mouth at bedtime. 08/11/14   Huston Foley, MD  diazepam (VALIUM) 5 MG tablet Take 1 tablet (5 mg total) by mouth every 6 (six) hours as needed for muscle spasms. 01/12/14   Tressie Stalker, MD  doxepin (SINEQUAN) 10 MG capsule Take 30 mg by mouth at bedtime.    Historical Provider, MD  Linaclotide Karlene Einstein) 145 MCG CAPS capsule Take 145 mcg by mouth daily as needed (for constipation).    Historical Provider, MD  tiZANidine (ZANAFLEX) 4 MG tablet Take 4 mg by mouth every 6 (six) hours as needed for muscle spasms.    Historical Provider, MD    ALLERGIES:  Allergies  Allergen Reactions  . Ambien [Zolpidem Tartrate] Rash and Other (See Comments)    Pt states she cannot walk after taking this medication  . Penicillins Hives and Swelling  . Topamax [Topiramate] Other (See Comments)    Gives her kidney stones  . Hydrocodone-Acetaminophen Itching    States only Vicodin makes her itch  . Versed [Midazolam] Itching  . Zolpidem Tartrate Itching and Rash    SOCIAL HISTORY:  Social History  Substance Use Topics  . Smoking status: Never Smoker   . Smokeless tobacco: Never Used  . Alcohol Use: No    FAMILY HISTORY: Family History  Problem Relation Age of Onset  . Kidney cancer Mother   . Diabetes Mother   . Prostate cancer Maternal Grandfather   . Ulcers Father   . Coronary artery disease Father     pci at age 71  . Pancreatic cancer Maternal Grandmother   . Lung cancer Paternal Grandmother     metastatic  . Aortic aneurysm      EXAM: BP 85/52 mmHg  Pulse 96  Temp(Src) 98.6 F (37 C) (Oral)  Resp 20  Ht 5\' 6"  (1.676 m)  Wt 230 lb (104.327 kg)  BMI 37.14 kg/m2  SpO2 98% CONSTITUTIONAL: Alert and oriented and responds appropriately to questions. Chronically ill-appearing, febrile, nontoxic HEAD: Normocephalic EYES: Conjunctivae clear, PERRL ENT: normal nose; no rhinorrhea; moist mucous membranes; pharynx without lesions noted; patient has a  vesicular lesion to the upper left lip consistent with previous history of herpes lesions in this area NECK: Supple, no meningismus, no LAD; no nuchal rigidity  CARD: RRR; S1 and S2 appreciated; no murmurs, no clicks, no rubs, no gallops RESP: Normal chest excursion without splinting or tachypnea; breath sounds clear and equal bilaterally; no wheezes, no rhonchi, no rales, no hypoxia or respiratory distress, speaking full sentences ABD/GI: Normal bowel sounds; non-distended; soft, non-tender, no rebound, no guarding, no peritoneal signs BACK:  The back appears normal and is non-tender to palpation, there is no CVA tenderness EXT: Normal ROM in all joints; non-tender to palpation; no edema; normal capillary refill; no cyanosis, no calf tenderness or swelling  SKIN: Normal color for age and race; warm, no rash NEURO: Moves all extremities equally, sensation to light touch intact diffusely, cranial nerves II through XII intact PSYCH: The patient's mood and manner are appropriate. Grooming and personal hygiene are appropriate.  MEDICAL DECISION MAKING: Patient here with hypotension. This may be secondary to sepsis but this may also be secondary to her taking atenolol with h/o POTS.  We'll give IV fluids and broad-spectrum antibiotics. We'll obtain labs, chest x-ray, urine, cultures. EKG shows no ischemic changes. We'll obtain troponin given she is having some mild chest pressure. She will need admission.  ED PROGRESS: Patient's labs showed no leukocytosis. Lactate is normal. Reticulocyte is mildly elevated at 1.47. Urine shows trace hemoglobin in few bacteria but no other sign of infection. Chest x-ray is clear. Flu swab pending. She is fluid responsive. Blood pressure improving and IV hydration. She has received Azactam, Levaquin and become ice in. Discussed with Dr. Conley Rolls with hospitalist service who will see the patient in the emergency department and admit. He will place holding orders. Care transferred  to hospitalist service.   CRITICAL CARE Performed by: Raelyn Number   Total critical care time: 45 minutes  Critical care time was exclusive of separately billable procedures and treating other patients.  Critical care was necessary to treat or prevent imminent or life-threatening deterioration.  Critical care was time spent personally by me on the following activities: development of treatment plan with patient and/or surrogate as well as nursing, discussions with consultants, evaluation of patient's response to treatment, examination of patient, obtaining history from patient or surrogate, ordering and performing treatments and interventions, ordering and review of laboratory studies, ordering and review of radiographic studies, pulse oximetry and re-evaluation of patient's condition.     EKG Interpretation  Date/Time:  Wednesday April 04 2015 00:30:37 EST Ventricular Rate:  68 PR Interval:  152 QRS Duration: 78 QT Interval:  405 QTC Calculation: 431 R Axis:   41 Text Interpretation:  Sinus rhythm Low voltage, precordial leads Abnormal R-wave progression, early transition No significant change since last tracing Confirmed by Nitza Schmid,  DO, Buzz Axel [phone]) on 04/04/2015 2:08:37 AM        Layla Maw Gurman [place], DO 04/04/15 0540  Layla Maw Brailon Don, DO 04/04/15 6045

## 2015-04-04 NOTE — ED Notes (Signed)
Pt c/o headache with dizziness x 2 days.  

## 2015-04-04 NOTE — Progress Notes (Signed)
Pharmacy Antibiotic Note  Sandra [name] is a 40 y.o. female admitted on 04/04/2015 with sepsis.  Pharmacy has been consulted for Vancomycin and Azactam dosing.  Plan: Aztreonam 1gm IV q8h Vancomycin  IV q12hrs (goal trough 15-20) Monitor labs, renal fxn, progress and c/s Deescalate ABX when improved / appropriate  Height:  (167.6 cm) Weight: 230 lb (104.327 kg) IBW/kg (Calculated) : 59.3  Temp (24hrs), Avg:99.1 F (37.3 C), Min:98.6 F (37 C), Max:100.6 F (38.1 C)   Recent Labs Lab 04/04/15 0102 04/04/15 0200  WBC  --  5.0  CREATININE  --  1.47*  LATICACIDVEN 1.44  --     Estimated Creatinine Clearance: 62.1 mL/min (by C-G formula based on Cr of 1.47).    Allergies  Allergen Reactions  . Ambien [Zolpidem Tartrate] Rash and Other (See Comments)    Pt states she cannot walk after taking this medication  . Penicillins Hives and Swelling    Has patient had a PCN reaction causing immediate rash, facial/tongue/throat swelling, SOB or lightheadedness with hypotension: Yes Has patient had a PCN reaction causing severe rash involving mucus membranes or skin necrosis: Yes Has patient had a PCN reaction that required hospitalization Yes Has patient had a PCN reaction occurring within the last 10 years: Yes If all of the above answers are "NO", then may proceed with Cephalosporin use.   . Topamax [Topiramate] Other (See Comments)    Gives her kidney stones  . Hydrocodone-Acetaminophen Itching    States only Vicodin makes her itch  . Latex Rash  . Versed [Midazolam] Itching   Anti-infectives    Start     Dose/Rate Route Frequency Ordered Stop   04/04/15 1600  vancomycin (VANCOCIN) IVPB 1000 mg/200 mL premix     1,000 mg 200 mL/hr over 60 Minutes Intravenous Every 12 hours 04/04/15 1057     04/04/15 1400  aztreonam (AZACTAM) 1 g in dextrose 5 % 50 mL IVPB     1 g 100 mL/hr over 30 Minutes Intravenous Every 8 hours 04/04/15 1051     04/04/15 0230  vancomycin  (VANCOCIN) 2,000 mg in sodium chloride 0.9 % 500 mL IVPB     2,000 mg 250 mL/hr over 120 Minutes Intravenous  Once 04/04/15 0216 04/04/15 0521   04/04/15 0230  aztreonam (AZACTAM) 2 g in dextrose 5 % 50 mL IVPB     2 g 100 mL/hr over 30 Minutes Intravenous  Once 04/04/15 0216 04/04/15 0306   04/04/15 0200  levofloxacin (LEVAQUIN) IVPB 750 mg     750 mg 100 mL/hr over 90 Minutes Intravenous  Once 04/04/15 0157 04/04/15 0358     Thank you for allowing pharmacy to be a part of this patient's care.  Valrie Hart A 04/04/2015 10:59 AM

## 2015-04-04 NOTE — Progress Notes (Signed)
Patient seen and examined. Admitted after midnight secondary to not feeling good, headaches and dizziness. Patient symptoms present since 2/5 and worsening; associated with nausea, vomiting and decrease PO intake. Patient was found with low grade temp and hypotensive in ED and was admitted for further evaluation and treatment. Please refer to H&P written by Dr. Conley Rolls for further info/details about admission.  Sandra [name] [phone]

## 2015-04-04 NOTE — H&P (Signed)
[facility] History and Physical  Sandra [name] WUJ:[id number] DOB: Apr 28, 1975    PCP:   Ignatius Specking., MD   Chief Complaint: Headache and dizziness.   HPI: Sandra [name] is an 40 y.o. female  With PMH of GERD, pseudotumor cerebri, and vertigo presents with complaints of headache and dizziness onset 2/5 and has been increasingly worsening with associated nausea, vomiting, lightheadedness, and vertigo. The symptoms are noted to be different from baseline. She also admits tingling, weakness, near syncope episode, dry mouth, shaking, diaphoresis and palpations. Recent syncope episodes one week ago. She also reports starting a beta blocker one week ago, but it was only Atenelol at 25mg  and Diamox of 500mg  daily x4. She hadn't not seen neurosurgery for evaluation of a shunt.  Her last LP showed opening pressure of 21.  She never had prednisone use, diarrhea, lasix use. Workup in ED reveal CBC that was unremarkable, CXR with no acute disease, BP noted to be low on admission with low grade fever, but has improved with IVF.  Her lactic acid was normal.   Hospitalist was asked to admit her due to her hypotension and low grade fever.  Despite her hypotension, she mentated quite well, and has no diaphoresis.    Rewiew of Systems:  Constitutional: Negative for chills. No significant weight loss or weight gain. Positive for subjective fever, malaise Eyes: Negative for eye pain, redness and discharge, diplopia, visual changes, or flashes of light. ENMT: Negative for ear pain, hoarseness, nasal congestion, sinus pressure and sore throat. No tinnitus, drooling, or problem swallowing. Positive for headache Cardiovascular: Negative for chest pain,  dyspnea and peripheral edema. ; No orthopnea, PND. Positive for palpations, diaphoresis Respiratory: Negative for cough, hemoptysis, wheezing and stridor. No pleuritic chestpain. Gastrointestinal: Negative for diarrhea, constipation, abdominal pain, melena, blood  in stool, hematemesis, jaundice and rectal bleeding.  Positive for nausea and vomiting.  Genitourinary: Negative for frequency, dysuria, incontinence,flank pain and hematuria; Musculoskeletal: Negative for back pain and neck pain. Negative for swelling and trauma.;  Skin: . Negative for pruritus, rash, abrasions, bruising and skin lesion.; ulcerations Neuro: Negative for neck stiffness. Negative for weakness, altered level of consciousness , altered mental status, extremity weakness, burning feet, involuntary movement, seizure and syncope. Positive for headache, lightheadedness Psych: negative for anxiety, depression, insomnia, tearfulness, panic attacks, hallucinations, paranoia, suicidal or homicidal ideation    Past Medical History  Diagnosis Date  . Fibromyalgia   . Depression   . Anxiety   . Hypertension   . Migraines   . Chest pain     RHC/LHC in 2007 with normal PCWP and PA pressure, normal coronaries EF 55%  . Dyspnea   . POTS (postural orthostatic tachycardia syndrome)     possible, episodes of sinus tachycardis, holter in 2007 with sinus tachycardia with rates as high as 160  . Ulcer     duodenal in 1993  . GERD (gastroesophageal reflux disease)     with esophageal stricture  . Complication of anesthesia   . PONV (postoperative nausea and vomiting)     usually phenergan helps  . History of hiatal hernia     stomach ulcer ==  had one at age 5  . Esophageal dilatation     has had them 4-5 times already  . Endometriosis   . Vertigo   . S/P Botox injection     for migraines    Past Surgical History  Procedure Laterality Date  . Cholecystectomy    . Bladder surgery  bladder tact  . Tubal ligation    . Abdominal hysterectomy    . Cardiac catheterization    . Esophageal dilation    . Cystoscopy w/ ureteral stent removal      Dr. Nechama Guard in [place]  . Fluid removal      from brain twice.  last time 8-9 mths ago @ [facility] imaging  . Lumbar  laminectomy/decompression microdiscectomy Left 01/11/2014    Procedure: LUMBAR LAMINECTOMY/DECOMPRESSION MICRODISCECTOMY 1 LEVEL LEFT LUMBAR TWO/THREE;  Surgeon: Tressie Stalker, MD;  Location: [facility];  Service: Neurosurgery;  Laterality: Left;  . Back surgery      Medications:  HOME MEDS: Prior to Admission medications   Medication Sig Start Date End Date Taking? Authorizing Provider  acetaZOLAMIDE (DIAMOX) 500 MG capsule Take 2 capsules (1,000 mg total) by mouth 2 (two) times daily. Patient taking differently: Take 2,500 mg by mouth 2 (two) times daily.  09/04/14  Yes Anson Fret, MD  FLECTOR 1.3 % [facility] As needed 11/14/13  Yes Historical Provider, MD  FLUoxetine (PROZAC) 40 MG capsule Take 40 mg by mouth daily.   Yes Historical Provider, MD  HYDROmorphone (DILAUDID) 4 MG tablet Take 1 tablet (4 mg total) by mouth every 4 (four) hours as needed for severe pain. 01/12/14  Yes Tressie Stalker, MD  ibuprofen (ADVIL,MOTRIN) 800 MG tablet TAKE ONE TABLET BY MOUTH EVERY 8 HOURS AS NEEDED 04/24/14  Yes Huston Foley, MD  meclizine (ANTIVERT) 25 MG tablet Take 1 tablet (25 mg total) by mouth 3 (three) times daily as needed for dizziness. 08/11/14  Yes Anson Fret, MD  OVER THE COUNTER MEDICATION Take 1 tablet by mouth daily. Stool softener   Yes Historical Provider, MD  oxyCODONE-acetaminophen (PERCOCET) 10-325 MG per tablet Take 1 tablet by mouth every 4 (four) hours as needed for pain.   Yes Historical Provider, MD  pantoprazole (PROTONIX) 40 MG tablet Take 40 mg by mouth daily.  08/13/12  Yes Historical Provider, MD  promethazine (PHENERGAN) 25 MG tablet Take 1 tablet (25 mg total) by mouth every 8 (eight) hours as needed for nausea or vomiting. 09/04/14  Yes Anson Fret, MD  rizatriptan (MAXALT-MLT) 5 MG disintegrating tablet Take 5 mg by mouth daily as needed for migraine. May repeat in 2 hours if needed   Yes Historical Provider, MD  cyclobenzaprine (FLEXERIL) 10 MG tablet Take 1  tablet (10 mg total) by mouth at bedtime. 08/11/14   Huston Foley, MD  diazepam (VALIUM) 5 MG tablet Take 1 tablet (5 mg total) by mouth every 6 (six) hours as needed for muscle spasms. 01/12/14   Tressie Stalker, MD  doxepin (SINEQUAN) 10 MG capsule Take 30 mg by mouth at bedtime.    Historical Provider, MD  Linaclotide Karlene Einstein) 145 MCG CAPS capsule Take 145 mcg by mouth daily as needed (for constipation).    Historical Provider, MD  tiZANidine (ZANAFLEX) 4 MG tablet Take 4 mg by mouth every 6 (six) hours as needed for muscle spasms.    Historical Provider, MD     Allergies:  Allergies  Allergen Reactions  . Ambien [Zolpidem Tartrate] Rash and Other (See Comments)    Pt states she cannot walk after taking this medication  . Penicillins Hives and Swelling  . Topamax [Topiramate] Other (See Comments)    Gives her kidney stones  . Hydrocodone-Acetaminophen Itching    States only Vicodin makes her itch  . Versed [Midazolam] Itching  . Zolpidem Tartrate Itching and Rash  Social History:   reports that she has never smoked. She has never used smokeless tobacco. She reports that she does not drink alcohol or use illicit drugs.  Family History: Family History  Problem Relation Age of Onset  . Kidney cancer Mother   . Diabetes Mother   . Prostate cancer Maternal Grandfather   . Ulcers Father   . Coronary artery disease Father     pci at age 37  . Pancreatic cancer Maternal Grandmother   . Lung cancer Paternal Grandmother     metastatic  . Aortic aneurysm       Physical Exam: Filed Vitals:   04/04/15 0013 04/04/15 0100 04/04/15 0200  BP: 85/52 67/42 69/50   Pulse: 96 65 71  Temp: 98.6 F (37 C)    TempSrc: Oral    Resp: 20 13 11   Height: 5\' 6"  (1.676 m)    Weight: 104.327 kg (230 lb)    SpO2: 98% 95% 98%   Blood pressure 69/50, pulse 71, temperature 98.6 F (37 C), temperature source Oral, resp. rate 11, height 5\' 6"  (1.676 m), weight 104.327 kg (230 lb), SpO2 98  %.  GEN:  Pleasant \ patient lying in the stretcher in no acute distress; cooperative with exam. PSYCH:  alert and oriented x4; does not appear anxious or depressed; affect is appropriate. HEENT: Mucous membranes pink and anicteric; PERRLA; EOM intact; no cervical lymphadenopathy nor thyromegaly or carotid bruit; no JVD; There were no stridor. Neck is very supple. Breasts:: Not examined CHEST WALL: No tenderness CHEST: Normal respiration, clear to auscultation bilaterally.  HEART: Regular rate and rhythm.  There are no murmur, rub, or gallops.   BACK: No kyphosis or scoliosis; no CVA tenderness ABDOMEN: soft and non-tender; no masses, no organomegaly, normal abdominal bowel sounds; no pannus; no intertriginous candida. There is no rebound and no distention. Rectal Exam: Not done EXTREMITIES: No bone or joint deformity; age-appropriate arthropathy of the hands and knees; no edema; no ulcerations.  There is no calf tenderness. Genitalia: not examined PULSES: 2+ and symmetric SKIN: Normal hydration no rash or ulceration CNS: Cranial nerves 2-12 grossly intact no focal lateralizing neurologic deficit.  Speech is fluent; uvula elevated with phonation, facial symmetry and tongue midline. DTR are normal bilaterally, cerebella exam is intact, barbinski is negative and strengths are equaled bilaterally.  No sensory loss.    EKG: Independently reviewed.    Assessment/Plan 1. Headache and dizziness, patient reports associated nausea and diaphoresis. We will continue with Diamox, though it doesn't appear that it is working for her, and she may eventually need to have a shunt.  2. Possible infection. BP soft on admission and low grade fever. Patient started on empiric abx. UC in process. I do not believe this is a true infection. She does not appear to be septic, with normal WBC and normal lactic acid.  3. Hypotension, improving with IVF.  It is possible she has adrenal insufficiency.  She has empty  tursella seen by prior imaging.  4. Pseudotumor cerebri, symptoms are different from baseline.  5. Chronic pain, pain management as needed.  6. Morbid obesity. 7. Anxiety.   PLAN:  Continue Vancomycin and Aztreonam pending cultures.  IVF  Start cortisol stress dose.    Check thyroid, testosterone, and cortisol.  Follow-up urine culture.   Other plans as per orders.  Request neurology consult.   Code Status: Full Family Communications: Daughter bedside.  Disposition: Admit to medical bed.   I, Dr. Houston Siren, personally performed  the services described in this documentation. All medical record entries made by the scribe were at my discretion and in my presence. Houston Siren, MD 04/04/2015 3:00am  Houston Siren, MD. FACP [facility] Pager [phone] 7pm to 7am.  04/04/2015, 2:16 AM  By signing my name below, I, Zadie Cleverly, attest that this documentation has been prepared under the direction and in the presence of Houston Siren, MD. Electronically signed: Zadie Cleverly, Scribe. 04/04/2015 2.16am

## 2015-04-05 DIAGNOSIS — E876 Hypokalemia: Secondary | ICD-10-CM

## 2015-04-05 DIAGNOSIS — K219 Gastro-esophageal reflux disease without esophagitis: Secondary | ICD-10-CM

## 2015-04-05 DIAGNOSIS — R6510 Systemic inflammatory response syndrome (SIRS) of non-infectious origin without acute organ dysfunction: Secondary | ICD-10-CM

## 2015-04-05 DIAGNOSIS — I9589 Other hypotension: Secondary | ICD-10-CM

## 2015-04-05 DIAGNOSIS — G932 Benign intracranial hypertension: Secondary | ICD-10-CM

## 2015-04-05 LAB — BASIC METABOLIC PANEL
ANION GAP: 5 (ref 5–15)
BUN: 11 mg/dL (ref 6–20)
CALCIUM: 8.4 mg/dL — AB (ref 8.9–10.3)
CHLORIDE: 114 mmol/L — AB (ref 101–111)
CO2: 22 mmol/L (ref 22–32)
Creatinine, Ser: 0.99 mg/dL (ref 0.44–1.00)
GFR calc non Af Amer: >60 mL/min (ref >60)
Glucose, Bld: 94 mg/dL (ref 65–99)
Potassium: 3.4 mmol/L — ABNORMAL LOW (ref 3.5–5.1)
SODIUM: 141 mmol/L (ref 135–145)

## 2015-04-05 LAB — URINE CULTURE

## 2015-04-05 LAB — CORTISOL: CORTISOL PLASMA: 3.7 ug/dL

## 2015-04-05 LAB — TSH: TSH: 0.24 u[IU]/mL — AB (ref 0.350–4.500)

## 2015-04-05 MED ORDER — POTASSIUM CHLORIDE CRYS ER 20 MEQ PO TBCR
40.0000 meq | EXTENDED_RELEASE_TABLET | ORAL | Status: AC
  Administered 2015-04-05 (×3): 40 meq via ORAL
  Filled 2015-04-05 (×3): qty 2

## 2015-04-05 MED ORDER — BUTALBITAL-APAP-CAFFEINE 50-325-40 MG PO TABS
1.0000 | ORAL_TABLET | Freq: Four times a day (QID) | ORAL | Status: DC | PRN
  Administered 2015-04-06: 1 via ORAL
  Filled 2015-04-05: qty 1

## 2015-04-05 MED ORDER — PREDNISONE 20 MG PO TABS
50.0000 mg | ORAL_TABLET | Freq: Every day | ORAL | Status: DC
  Administered 2015-04-06: 50 mg via ORAL
  Filled 2015-04-05: qty 2

## 2015-04-05 NOTE — Plan of Care (Signed)
Problem: Safety: Goal: Ability to remain free from injury will improve Outcome: Progressing Pt remained free of injury this shift.  Problem: Pain Managment: Goal: General experience of comfort will improve Outcome: Progressing Pt stated she had some pain, but did not want to take her Valium and pain medication too close together. She chose to hold off on pain medication and take the Valium for anxiety and rest.   Problem: Physical Regulation: Goal: Ability to maintain clinical measurements within normal limits will improve Outcome: Progressing Vitals within defined limits this shift. See flowsheet.   Problem: Tissue Perfusion: Goal: Risk factors for ineffective tissue perfusion will decrease Outcome: Progressing Lovenox is the VTE.

## 2015-04-05 NOTE — Progress Notes (Signed)
[facility] PROGRESS NOTE  Sandra [name] ZOX:[id number] DOB: 09-05-1975 DOA: 04/04/2015 PCP: Ignatius Specking., MD  Assessment/Plan: 1-hypotension: -initially presumed to be secondary to viral infection  -influenza neg -no fever -BP is now up -will discontinue abx's and observe off treatment   2-HA's/migraine  -will start fioricet PRN for pain -PRN toradol  3-pseudotumor cerebri -continue prednisone -outpatient follow up for further decision about shunting  -no meningeal signs on exam -continue diamox  4-GERD: -PPI  5-depression/anxiety: -continue prozac and valium   6-hypokalemia: -will replete as needed   Code Status: Full Family Communication: dad at bedside  Disposition Plan: will discontinue antibiotics, start adjusting steroids and follow clinical response    Consultants:  None   Procedures:  See below for x-ray reports   Antibiotics:  Vanc/aztreonam 04/03/15>>04/05/15  HPI/Subjective: Afebrile, currently, no further vomiting or diarrhea.   Objective: Filed Vitals:   04/05/15 0526 04/05/15 1412  BP: 132/62 155/65  Pulse: 70 74  Temp: 98.2 F (36.8 C) 98.4 F (36.9 C)  Resp: 18 18    Intake/Output Summary (Last 24 hours) at 04/05/15 1519 Last data filed at 04/05/15 1413  Gross per 24 hour  Intake    480 ml  Output    200 ml  Net    280 ml   Filed Weights   04/04/15 0013  Weight: 104.327 kg (230 lb)    Exam:   General:  Afebrile, no CP, still slightly nauseous; no further vomiting and able to keep food down. No diarrhea.   Cardiovascular: S1 and S2, no rubs, no gallops  Respiratory: CTA, no use of accessory muscles   Abdomen: soft, no guarding, positive BS  Musculoskeletal: no Le edema, no cyanosis   Data Reviewed: Basic Metabolic Panel:  Recent Labs Lab 04/04/15 0200 04/05/15 0658  NA 139 141  K 3.6 3.4*  CL 107 114*  CO2 24 22  GLUCOSE 146* 94  BUN 8 11  CREATININE 1.47* 0.99  CALCIUM 8.9 8.4*  MG 2.1  --     Liver Function Tests:  Recent Labs Lab 04/04/15 0200  AST 66*  ALT 42  ALKPHOS 92  BILITOT 0.4  PROT 6.8  ALBUMIN 3.6   CBC:  Recent Labs Lab 04/04/15 0200  WBC 5.0  NEUTROABS 3.1  HGB 12.9  HCT 39.1  MCV 95.6  PLT 199   Cardiac Enzymes:  Recent Labs Lab 04/04/15 0200  TROPONINI <0.03    Recent Results (from the past 240 hour(s))  Blood culture (routine x 2)     Status: None (Preliminary result)   Collection Time: 04/04/15  1:30 AM  Result Value Ref Range Status   Specimen Description RIGHT ANTECUBITAL  Final   Special Requests BOTTLES DRAWN AEROBIC AND ANAEROBIC 8CC  Final   Culture NO GROWTH 1 DAY  Final   Report Status PENDING  Incomplete  Blood culture (routine x 2)     Status: None (Preliminary result)   Collection Time: 04/04/15  1:40 AM  Result Value Ref Range Status   Specimen Description BLOOD LEFT HAND  Final   Special Requests BOTTLES DRAWN AEROBIC AND ANAEROBIC 8CC  Final   Culture NO GROWTH 1 DAY  Final   Report Status PENDING  Incomplete  Urine culture     Status: None   Collection Time: 04/04/15  2:03 AM  Result Value Ref Range Status   Specimen Description URINE, CLEAN CATCH  Final   Special Requests NONE  Final   Culture  Final    1,000 COLONIES/mL INSIGNIFICANT GROWTH Performed at [facility]    Report Status 04/05/2015 FINAL  Final     Studies: Dg Chest 2 View  04/04/2015  CLINICAL DATA:  40 year old female with chest pain EXAM: CHEST  2 VIEW COMPARISON:  Radiograph dated 01/10/2014 FINDINGS: The heart size and mediastinal contours are within normal limits. Both lungs are clear. The visualized skeletal structures are unremarkable. IMPRESSION: No active cardiopulmonary disease. Electronically Signed   By: Elgie Collard M.D.   On: 04/04/2015 02:24    Scheduled Meds: . acetaZOLAMIDE  250 mg Oral QID  . aztreonam  1 g Intravenous Q8H  . enoxaparin (LOVENOX) injection  40 mg Subcutaneous Q24H  . FLUoxetine  40 mg Oral  Daily  . pantoprazole  40 mg Oral Daily  . potassium chloride  40 mEq Oral Q4H  . sodium chloride flush  3 mL Intravenous Q12H  . vancomycin  1,000 mg Intravenous Q12H   Continuous Infusions: . sodium chloride 150 mL/hr at 04/05/15 1301    Principal Problem:   Hypotension Active Problems:   Morbid obesity (HCC)   Pseudotumor cerebri   POTS (postural orthostatic tachycardia syndrome)   SIRS (systemic inflammatory response syndrome) (HCC)   Pyrexia    Time spent: 30 minutes    Vassie Loll  [facility] Pager [phone]. If 7PM-7AM, please contact night-coverage at [email or web address], password [facility] 04/05/2015, 3:19 PM  LOS: 1 day

## 2015-04-06 DIAGNOSIS — I959 Hypotension, unspecified: Secondary | ICD-10-CM

## 2015-04-06 DIAGNOSIS — G43909 Migraine, unspecified, not intractable, without status migrainosus: Secondary | ICD-10-CM | POA: Insufficient documentation

## 2015-04-06 DIAGNOSIS — N179 Acute kidney failure, unspecified: Secondary | ICD-10-CM

## 2015-04-06 LAB — TESTOSTERONE, FREE: Testosterone, Free: 0.3 pg/mL (ref 0.0–4.2)

## 2015-04-06 LAB — BASIC METABOLIC PANEL
ANION GAP: 5 (ref 5–15)
BUN: 10 mg/dL (ref 6–20)
CALCIUM: 8 mg/dL — AB (ref 8.9–10.3)
CO2: 20 mmol/L — AB (ref 22–32)
Chloride: 117 mmol/L — ABNORMAL HIGH (ref 101–111)
Creatinine, Ser: 1.17 mg/dL — ABNORMAL HIGH (ref 0.44–1.00)
GFR calc Af Amer: >60 mL/min (ref >60)
GFR calc non Af Amer: 57 mL/min — ABNORMAL LOW (ref >60)
GLUCOSE: 105 mg/dL — AB (ref 65–99)
Potassium: 4 mmol/L (ref 3.5–5.1)
Sodium: 142 mmol/L (ref 135–145)

## 2015-04-06 MED ORDER — BUTALBITAL-APAP-CAFFEINE 50-325-40 MG PO TABS: 1.0000 | ORAL_TABLET | Freq: Four times a day (QID) | ORAL | Status: AC | PRN

## 2015-04-06 MED ORDER — PREDNISONE 20 MG PO TABS: ORAL_TABLET | ORAL | Status: DC

## 2015-04-06 NOTE — Progress Notes (Signed)
Patient was discharged home with instructions given on medications,and follow up visits,patient verbalized understanding. Prescriptions sent with patient.. Accompanied by staff to an awaiting vehicle. 

## 2015-04-06 NOTE — Discharge Summary (Signed)
Physician Discharge Summary  NATALYAH CUMMISKEY [id number] DOB: 04-11-1975 DOA: 04/04/2015  PCP: Ignatius Specking., MD  Admit date: 04/04/2015 Discharge date: 04/06/2015  Time spent: 35 minutes  Recommendations for Outpatient Follow-up:  1. Repeat BMET to check renal function and electrolytes   Discharge Diagnoses:  Principal Problem:   Hypotension Active Problems:   Morbid obesity (HCC)   Pseudotumor cerebri   POTS (postural orthostatic tachycardia syndrome)   SIRS (systemic inflammatory response syndrome) (HCC)   Pyrexia Obesity  GERD Depression   Discharge Condition: stable and improved. Will follow up with PCP and neurologist as an outpatient.  Diet recommendation: low calorie diet  Filed Weights   04/04/15 0013  Weight: 104.327 kg (230 lb)    History of present illness:  40 y.o. female With PMH of GERD, pseudotumor cerebri, and vertigo presents with complaints of headache and dizziness onset 2/5 and has been increasingly worsening with associated nausea, vomiting, lightheadedness, and vertigo. The symptoms are noted to be different from baseline. She also admits tingling, weakness, near syncope episode, dry mouth, shaking, diaphoresis and palpations. Recent syncope episodes one week ago. She also reports starting a beta blocker one week ago, but it was only Atenelol at  and Diamox of  daily x4. She hadn't not seen neurosurgery for evaluation of a shunt. Her last LP showed opening pressure of 21. She never had prednisone use, diarrhea, lasix use. Workup in ED reveal CBC that was unremarkable, CXR with no acute disease, BP noted to be low on admission with low grade fever, but has improved with IVF. Her lactic acid was normal. Hospitalist was asked to admit her due to her hypotension and low grade fever. Despite her hypotension, she mentated quite well, and has no diaphoresis.   Hospital Course:  1-SIRS/hypotension: -Presumed to be secondary to viral infection (most  likely viral gastroenteritis) -influenza neg -no further fever -BP is now up and WNL -patient observe off antibiotics for 30 hours and no signs or features of infections appreciated -at discharge SIRS has resolved   2-AKI -due to pre-renal azotemia from dehydration -resolved with IVF's and resolution of hypotension  3-HA's/migraine  -will start fioricet PRN for pain -continue home medication and follow up with neurology  4-pseudotumor cerebri -continue prednisone -outpatient follow up for further decision about need for shunting  -no meningeal signs on exam -continue diamox  5-GERD: -PPI  6-depression/anxiety: -continue prozac and valium   7-hypokalemia: -repleted and WNL at discharge -secondary to GI loses  8-obesity -low calorie diet and exercise discussed with patient -Body mass index is 37.14 kg/(m^2).   Procedures:  See below for x-ray reports   Consultations:  None   Discharge Exam: Filed Vitals:   04/05/15 2242 04/06/15 0554  BP: 128/61 150/84  Pulse: 59 65  Temp: 97.8 F (36.6 C) 98.2 F (36.8 C)  Resp: 20 20    General: Obese, Afebrile, no CP, no further nausea or vomiting and able to keep food down. No more diarrhea and denies abd pain. Still with some HA's. BP WNL at discharge.  Cardiovascular: S1 and S2, no rubs, no gallops  Respiratory: CTA, no use of accessory muscles   Abdomen: soft, no guarding, positive BS, no tenderness   Musculoskeletal: no Le edema, no cyanosis   Discharge Instructions   Discharge Instructions    Diet - low sodium heart healthy    Complete by:  As directed      Discharge instructions    Complete by:  As directed  Take medications as prescribed Arrange follow up with PCP in 10 days  Follow up with neurologist as an outpatient Keep yourself well hydrated          Current Discharge Medication List    START taking these medications   Details  butalbital-acetaminophen-caffeine (FIORICET, ESGIC)  50-325-40 MG tablet Take 1 tablet by mouth every 6 (six) hours as needed for headache. Qty: 20 tablet, Refills: 0    predniSONE (DELTASONE) 20 MG tablet Take two tablets by mouth daily X 3 days; then 1 tablet by mouth daily X 3 days; then 1/2 tablet by mouth daily X 3 days and stop prednisone. Qty: 12 tablet, Refills: 0      CONTINUE these medications which have NOT CHANGED   Details  acetaZOLAMIDE (DIAMOX) 500 MG capsule Take 2 capsules (1,000 mg total) by mouth 2 (two) times daily. Qty: 120 capsule, Refills: 5   Associated Diagnoses: Pseudotumor cerebri syndrome    diphenhydrAMINE (BENADRYL) 25 mg capsule Take 25 mg by mouth every 6 (six) hours as needed for itching.    FLECTOR 1.3 % PTCH As needed Refills: 0    FLUoxetine (PROZAC) 40 MG capsule Take 40 mg by mouth daily.    ibuprofen (ADVIL,MOTRIN) 800 MG tablet TAKE ONE TABLET BY MOUTH EVERY 8 HOURS AS NEEDED Qty: 90 tablet, Refills: 3    meclizine (ANTIVERT) 25 MG tablet Take 1 tablet (25 mg total) by mouth 3 (three) times daily as needed for dizziness. Qty: 30 tablet, Refills: 1   Associated Diagnoses: Intractable migraine without aura and with status migrainosus    oxyCODONE-acetaminophen (PERCOCET) 10-325 MG per tablet Take 1 tablet by mouth every 4 (four) hours as needed for pain.    pantoprazole (PROTONIX) 40 MG tablet Take 40 mg by mouth daily.     promethazine (PHENERGAN) 25 MG tablet Take 1 tablet (25 mg total) by mouth every 8 (eight) hours as needed for nausea or vomiting. Qty: 30 tablet, Refills: 3   Associated Diagnoses: Severe headache; Migraine without aura and with status migrainosus, not intractable; Intractable chronic migraine without aura and without status migrainosus    rizatriptan (MAXALT-MLT) 5 MG disintegrating tablet Take 5 mg by mouth daily as needed for migraine. May repeat in 2 hours if needed    tiZANidine (ZANAFLEX) 4 MG tablet Take 4 mg by mouth every 6 (six) hours as needed for muscle spasms.     cyclobenzaprine (FLEXERIL) 10 MG tablet Take 1 tablet (10 mg total) by mouth at bedtime. Qty: 30 tablet, Refills: 0    diazepam (VALIUM) 5 MG tablet Take 1 tablet (5 mg total) by mouth every 6 (six) hours as needed for muscle spasms. Qty: 50 tablet, Refills: 1    Linaclotide (LINZESS) 145 MCG CAPS capsule Take 145 mcg by mouth daily as needed (for constipation).       Allergies  Allergen Reactions  . Ambien [Zolpidem Tartrate] Rash and Other (See Comments)    Pt states she cannot walk after taking this medication  . Penicillins Hives and Swelling    Has patient had a PCN reaction causing immediate rash, facial/tongue/throat swelling, SOB or lightheadedness with hypotension: Yes Has patient had a PCN reaction causing severe rash involving mucus membranes or skin necrosis: Yes Has patient had a PCN reaction that required hospitalization Yes Has patient had a PCN reaction occurring within the last 10 years: Yes If all of the above answers are "NO", then may proceed with Cephalosporin use.   . Topamax [Topiramate]  Other (See Comments)    Gives her kidney stones  . Hydrocodone-Acetaminophen Itching    States only Vicodin makes her itch  . Latex Rash  . Versed [Midazolam] Itching   Follow-up Information    Follow up with VYAS,DHRUV B., MD. Schedule an appointment as soon as possible for a visit in 10 days.   Specialty:  Internal Medicine   Contact information:   [address] [place] [address] [phone]        The results of significant diagnostics from this hospitalization (including imaging, microbiology, ancillary and laboratory) are listed below for reference.    Significant Diagnostic Studies: Dg Chest 2 View  04/04/2015  CLINICAL DATA:  40 year old female with chest pain EXAM: CHEST  2 VIEW COMPARISON:  Radiograph dated 01/10/2014 FINDINGS: The heart size and mediastinal contours are within normal limits. Both lungs are clear. The visualized skeletal structures are  unremarkable. IMPRESSION: No active cardiopulmonary disease. Electronically Signed   By: Elgie Collard M.D.   On: 04/04/2015 02:24    Microbiology: Recent Results (from the past 240 hour(s))  Blood culture (routine x 2)     Status: None (Preliminary result)   Collection Time: 04/04/15  1:30 AM  Result Value Ref Range Status   Specimen Description RIGHT ANTECUBITAL  Final   Special Requests BOTTLES DRAWN AEROBIC AND ANAEROBIC 8CC  Final   Culture NO GROWTH 2 DAYS  Final   Report Status PENDING  Incomplete  Blood culture (routine x 2)     Status: None (Preliminary result)   Collection Time: 04/04/15  1:40 AM  Result Value Ref Range Status   Specimen Description BLOOD LEFT HAND  Final   Special Requests BOTTLES DRAWN AEROBIC AND ANAEROBIC 8CC  Final   Culture NO GROWTH 2 DAYS  Final   Report Status PENDING  Incomplete  Urine culture     Status: None   Collection Time: 04/04/15  2:03 AM  Result Value Ref Range Status   Specimen Description URINE, CLEAN CATCH  Final   Special Requests NONE  Final   Culture   Final    1,000 COLONIES/mL INSIGNIFICANT GROWTH Performed at [facility]    Report Status 04/05/2015 FINAL  Final     Labs: Basic Metabolic Panel:  Recent Labs Lab 04/04/15 0200 04/05/15 0658 04/06/15 0629  NA 139 141 142  K 3.6 3.4* 4.0  CL 107 114* 117*  CO2 24 22 20*  GLUCOSE 146* 94 105*  BUN CREATININE 1.47* 0.99 1.17*  CALCIUM 8.9 8.4* 8.0*  MG 2.1  --   --    Liver Function Tests:  Recent Labs Lab 04/04/15 0200  AST 66*  ALT 42  ALKPHOS 92  BILITOT 0.4  PROT 6.8  ALBUMIN 3.6   CBC:  Recent Labs Lab 04/04/15 0200  WBC 5.0  NEUTROABS 3.1  HGB 12.9  HCT 39.1  MCV 95.6  PLT 199   Cardiac Enzymes:  Recent Labs Lab 04/04/15 0200  TROPONINI <0.03   Signed:  Vassie Loll MD.  [facility] 04/06/2015, 10:36 AM

## 2015-04-09 LAB — CULTURE, BLOOD (ROUTINE X 2)
CULTURE: NO GROWTH
Culture: NO GROWTH

## 2015-10-23 ENCOUNTER — Other Ambulatory Visit: Payer: Self-pay | Admitting: Neurosurgery

## 2015-10-23 DIAGNOSIS — G932 Benign intracranial hypertension: Secondary | ICD-10-CM

## 2015-11-01 ENCOUNTER — Ambulatory Visit
Admission: RE | Admit: 2015-11-01 | Discharge: 2015-11-01 | Disposition: A | Discharge status: Home / Self Care | Payer: Medicaid Other | Source: Ambulatory Visit | Attending: Neurosurgery | Admitting: Neurosurgery

## 2015-11-01 DIAGNOSIS — G932 Benign intracranial hypertension: Secondary | ICD-10-CM

## 2015-11-01 NOTE — Discharge Instructions (Signed)

## 2015-11-05 ENCOUNTER — Telehealth: Payer: Self-pay

## 2015-11-05 NOTE — Telephone Encounter (Signed)
Called patient to see how she is doing after LP here 11/01/15.  She denies positional headache but states her legs feel heavy and that she gets shooting pains down them when she is walking or standing.  She states she's had back surgery and has ongoing back problems.  I encouraged her to take NSAIDs around the clock for the next few days to see if that didn't settle down her sypmtoms; otherwise, give her doctor a call.  Donell SievertJeanne Sharad Vaneaton, RN

## 2015-11-15 ENCOUNTER — Other Ambulatory Visit: Payer: Self-pay | Admitting: Neurosurgery

## 2015-11-15 ENCOUNTER — Encounter (HOSPITAL_COMMUNITY): Payer: Self-pay | Admitting: *Deleted

## 2015-11-19 ENCOUNTER — Inpatient Hospital Stay (HOSPITAL_COMMUNITY)
Admission: RE | Admit: 2015-11-19 | Discharge: 2015-11-21 | DRG: 033 | Disposition: A | Discharge status: Home / Self Care | Payer: Medicaid Other | Source: Ambulatory Visit | Attending: Neurosurgery | Admitting: Neurosurgery

## 2015-11-19 ENCOUNTER — Inpatient Hospital Stay (HOSPITAL_COMMUNITY): Payer: Medicaid Other | Admitting: Anesthesiology

## 2015-11-19 ENCOUNTER — Encounter (HOSPITAL_COMMUNITY): Payer: Self-pay | Admitting: Surgery

## 2015-11-19 ENCOUNTER — Encounter (HOSPITAL_COMMUNITY)
Admission: RE | Disposition: A | Discharge status: Home / Self Care | Payer: Self-pay | Source: Ambulatory Visit | Attending: Neurosurgery

## 2015-11-19 DIAGNOSIS — R51 Headache: Secondary | ICD-10-CM | POA: Diagnosis present

## 2015-11-19 DIAGNOSIS — I10 Essential (primary) hypertension: Secondary | ICD-10-CM | POA: Diagnosis present

## 2015-11-19 DIAGNOSIS — Z79899 Other long term (current) drug therapy: Secondary | ICD-10-CM | POA: Diagnosis not present

## 2015-11-19 DIAGNOSIS — F329 Major depressive disorder, single episode, unspecified: Secondary | ICD-10-CM | POA: Diagnosis present

## 2015-11-19 DIAGNOSIS — K219 Gastro-esophageal reflux disease without esophagitis: Secondary | ICD-10-CM | POA: Diagnosis present

## 2015-11-19 DIAGNOSIS — G932 Benign intracranial hypertension: Secondary | ICD-10-CM | POA: Diagnosis present

## 2015-11-19 DIAGNOSIS — M797 Fibromyalgia: Secondary | ICD-10-CM | POA: Diagnosis present

## 2015-11-19 DIAGNOSIS — T85618A Breakdown (mechanical) of other specified internal prosthetic devices, implants and grafts, initial encounter: Secondary | ICD-10-CM | POA: Diagnosis present

## 2015-11-19 HISTORY — DX: Dorsalgia, unspecified: M54.9

## 2015-11-19 HISTORY — DX: Other reaction to spinal and lumbar puncture: G97.1

## 2015-11-19 HISTORY — DX: Personal history of other diseases of the respiratory system: Z87.09

## 2015-11-19 HISTORY — DX: Pneumonia, unspecified organism: J18.9

## 2015-11-19 HISTORY — DX: Allergy, unspecified, initial encounter: T78.40XA

## 2015-11-19 HISTORY — DX: Pain in unspecified joint: M25.50

## 2015-11-19 HISTORY — PX: VENTRICULOPERITONEAL SHUNT: SHX204

## 2015-11-19 HISTORY — DX: Other chronic pain: G89.29

## 2015-11-19 HISTORY — DX: Effusion, unspecified joint: M25.40

## 2015-11-19 HISTORY — DX: Constipation, unspecified: K59.00

## 2015-11-19 HISTORY — DX: Unspecified osteoarthritis, unspecified site: M19.90

## 2015-11-19 HISTORY — DX: Personal history of peptic ulcer disease: Z87.11

## 2015-11-19 HISTORY — DX: Weakness: R53.1

## 2015-11-19 HISTORY — DX: Other muscle spasm: M62.838

## 2015-11-19 HISTORY — DX: Personal history of other diseases of the digestive system: Z87.19

## 2015-11-19 HISTORY — DX: Nocturia: R35.1

## 2015-11-19 LAB — CBC
HCT: 37.7 % (ref 36.0–46.0)
HEMOGLOBIN: 12.2 g/dL (ref 12.0–15.0)
MCH: 29.3 pg (ref 26.0–34.0)
MCHC: 32.4 g/dL (ref 30.0–36.0)
MCV: 90.6 fL (ref 78.0–100.0)
PLATELETS: 192 10*3/uL (ref 150–400)
RBC: 4.16 MIL/uL (ref 3.87–5.11)
RDW: 12.2 % (ref 11.5–15.5)
WBC: 6.2 10*3/uL (ref 4.0–10.5)

## 2015-11-19 LAB — BASIC METABOLIC PANEL
Anion gap: 7 (ref 5–15)
BUN: 11 mg/dL (ref 6–20)
CHLORIDE: 106 mmol/L (ref 101–111)
CO2: 24 mmol/L (ref 22–32)
CREATININE: 0.96 mg/dL (ref 0.44–1.00)
Calcium: 8.9 mg/dL (ref 8.9–10.3)
Glucose, Bld: 96 mg/dL (ref 65–99)
POTASSIUM: 3.4 mmol/L — AB (ref 3.5–5.1)
SODIUM: 137 mmol/L (ref 135–145)

## 2015-11-19 LAB — MRSA PCR SCREENING: MRSA BY PCR: NEGATIVE

## 2015-11-19 SURGERY — SHUNT INSERTION VENTRICULAR-PERITONEAL
Anesthesia: General | Site: Head | Laterality: Right

## 2015-11-19 MED ORDER — LIDOCAINE 2% (20 MG/ML) 5 ML SYRINGE
INTRAMUSCULAR | Status: AC
  Filled 2015-11-19: qty 5

## 2015-11-19 MED ORDER — POTASSIUM CHLORIDE IN NACL 20-0.9 MEQ/L-% IV SOLN
INTRAVENOUS | Status: DC
  Administered 2015-11-19: 16:00:00 via INTRAVENOUS
  Filled 2015-11-19 (×2): qty 1000

## 2015-11-19 MED ORDER — PROMETHAZINE HCL 25 MG PO TABS
12.5000 mg | ORAL_TABLET | ORAL | Status: DC | PRN
  Administered 2015-11-20: 12.5 mg via ORAL
  Filled 2015-11-19: qty 1

## 2015-11-19 MED ORDER — ACETAMINOPHEN 650 MG RE SUPP: 650.0000 mg | RECTAL | Status: DC | PRN

## 2015-11-19 MED ORDER — SUMATRIPTAN SUCCINATE 50 MG PO TABS
50.0000 mg | ORAL_TABLET | ORAL | Status: DC | PRN
  Filled 2015-11-19: qty 1

## 2015-11-19 MED ORDER — ACETAMINOPHEN 10 MG/ML IV SOLN
INTRAVENOUS | Status: AC
  Filled 2015-11-19: qty 100

## 2015-11-19 MED ORDER — DIPHENHYDRAMINE HCL 25 MG PO CAPS
25.0000 mg | ORAL_CAPSULE | Freq: Four times a day (QID) | ORAL | Status: DC | PRN
Start: 2015-11-19 — End: 2015-11-21
  Administered 2015-11-19 – 2015-11-21 (×5): 25 mg via ORAL
  Filled 2015-11-19 (×5): qty 1

## 2015-11-19 MED ORDER — DEXAMETHASONE SODIUM PHOSPHATE 10 MG/ML IJ SOLN
INTRAMUSCULAR | Status: DC | PRN
  Administered 2015-11-19: 10 mg via INTRAVENOUS

## 2015-11-19 MED ORDER — VANCOMYCIN HCL 1000 MG IV SOLR
INTRAVENOUS | Status: DC | PRN
  Administered 2015-11-19: 1000 mg via INTRAVENOUS

## 2015-11-19 MED ORDER — DEXAMETHASONE SODIUM PHOSPHATE 10 MG/ML IJ SOLN
INTRAMUSCULAR | Status: AC
  Filled 2015-11-19: qty 1

## 2015-11-19 MED ORDER — SCOPOLAMINE 1 MG/3DAYS TD PT72
MEDICATED_PATCH | TRANSDERMAL | Status: AC
  Filled 2015-11-19: qty 1

## 2015-11-19 MED ORDER — ONDANSETRON HCL 4 MG/2ML IJ SOLN: 4.0000 mg | Freq: Once | INTRAMUSCULAR | Status: DC | PRN

## 2015-11-19 MED ORDER — GLYCOPYRROLATE 0.2 MG/ML IV SOSY
PREFILLED_SYRINGE | INTRAVENOUS | Status: AC
  Filled 2015-11-19: qty 3

## 2015-11-19 MED ORDER — ONDANSETRON HCL 4 MG PO TABS: 4.0000 mg | ORAL_TABLET | ORAL | Status: DC | PRN

## 2015-11-19 MED ORDER — PANTOPRAZOLE SODIUM 40 MG PO TBEC
40.0000 mg | DELAYED_RELEASE_TABLET | Freq: Every day | ORAL | Status: DC
  Administered 2015-11-19 – 2015-11-20 (×2): 40 mg via ORAL
  Filled 2015-11-19 (×2): qty 1

## 2015-11-19 MED ORDER — MORPHINE SULFATE (PF) 2 MG/ML IV SOLN
1.0000 mg | INTRAVENOUS | Status: DC | PRN
  Administered 2015-11-19 – 2015-11-20 (×4): 2 mg via INTRAVENOUS
  Filled 2015-11-19 (×4): qty 1

## 2015-11-19 MED ORDER — OXYCODONE HCL 5 MG PO TABS: 5.0000 mg | ORAL_TABLET | Freq: Once | ORAL | Status: DC | PRN

## 2015-11-19 MED ORDER — FENTANYL CITRATE (PF) 100 MCG/2ML IJ SOLN
INTRAMUSCULAR | Status: AC
  Filled 2015-11-19: qty 2

## 2015-11-19 MED ORDER — ROCURONIUM BROMIDE 100 MG/10ML IV SOLN
INTRAVENOUS | Status: DC | PRN
  Administered 2015-11-19: 10 mg via INTRAVENOUS
  Administered 2015-11-19: 50 mg via INTRAVENOUS

## 2015-11-19 MED ORDER — PROMETHAZINE HCL 25 MG PO TABS: 25.0000 mg | ORAL_TABLET | Freq: Three times a day (TID) | ORAL | Status: DC | PRN

## 2015-11-19 MED ORDER — LORATADINE 10 MG PO TABS
10.0000 mg | ORAL_TABLET | Freq: Every day | ORAL | Status: DC
  Administered 2015-11-19 – 2015-11-20 (×2): 10 mg via ORAL
  Filled 2015-11-19 (×2): qty 1

## 2015-11-19 MED ORDER — ONDANSETRON HCL 4 MG/2ML IJ SOLN
4.0000 mg | INTRAMUSCULAR | Status: DC | PRN
  Administered 2015-11-19: 4 mg via INTRAVENOUS
  Filled 2015-11-19: qty 2

## 2015-11-19 MED ORDER — PREGABALIN 75 MG PO CAPS
75.0000 mg | ORAL_CAPSULE | Freq: Three times a day (TID) | ORAL | Status: DC
  Administered 2015-11-19 – 2015-11-20 (×5): 75 mg via ORAL
  Filled 2015-11-19 (×5): qty 1

## 2015-11-19 MED ORDER — VANCOMYCIN HCL IN DEXTROSE 1-5 GM/200ML-% IV SOLN
INTRAVENOUS | Status: AC
  Filled 2015-11-19: qty 200

## 2015-11-19 MED ORDER — ACETAZOLAMIDE ER 500 MG PO CP12
1000.0000 mg | ORAL_CAPSULE | Freq: Two times a day (BID) | ORAL | Status: DC
  Administered 2015-11-19 – 2015-11-20 (×3): 1000 mg via ORAL
  Filled 2015-11-19 (×4): qty 2

## 2015-11-19 MED ORDER — FLUOXETINE HCL 20 MG PO CAPS
40.0000 mg | ORAL_CAPSULE | Freq: Every day | ORAL | Status: DC
  Administered 2015-11-20: 40 mg via ORAL
  Filled 2015-11-19 (×2): qty 2

## 2015-11-19 MED ORDER — IBUPROFEN 200 MG PO TABS: 800.0000 mg | ORAL_TABLET | Freq: Three times a day (TID) | ORAL | Status: DC | PRN

## 2015-11-19 MED ORDER — LACTATED RINGERS IV SOLN
INTRAVENOUS | Status: DC
  Administered 2015-11-19: 09:00:00 via INTRAVENOUS

## 2015-11-19 MED ORDER — LINACLOTIDE 145 MCG PO CAPS
145.0000 ug | ORAL_CAPSULE | Freq: Every day | ORAL | Status: DC | PRN
  Filled 2015-11-19: qty 1

## 2015-11-19 MED ORDER — OXYCODONE HCL 5 MG/5ML PO SOLN: 5.0000 mg | Freq: Once | ORAL | Status: DC | PRN

## 2015-11-19 MED ORDER — RIZATRIPTAN BENZOATE 5 MG PO TBDP: 5.0000 mg | ORAL_TABLET | Freq: Every day | ORAL | Status: DC | PRN

## 2015-11-19 MED ORDER — DEXAMETHASONE SODIUM PHOSPHATE 10 MG/ML IJ SOLN: INTRAMUSCULAR | Status: DC | PRN

## 2015-11-19 MED ORDER — OXYCODONE-ACETAMINOPHEN 5-325 MG PO TABS
1.0000 | ORAL_TABLET | ORAL | Status: DC | PRN
  Administered 2015-11-19 – 2015-11-21 (×6): 1 via ORAL
  Filled 2015-11-19 (×6): qty 1

## 2015-11-19 MED ORDER — OXYCODONE-ACETAMINOPHEN 10-325 MG PO TABS: 1.0000 | ORAL_TABLET | ORAL | Status: DC | PRN

## 2015-11-19 MED ORDER — BISACODYL 10 MG RE SUPP: 10.0000 mg | Freq: Every day | RECTAL | Status: DC | PRN

## 2015-11-19 MED ORDER — LACTATED RINGERS IV SOLN
INTRAVENOUS | Status: DC | PRN
  Administered 2015-11-19 (×2): via INTRAVENOUS

## 2015-11-19 MED ORDER — HEMOSTATIC AGENTS (NO CHARGE) OPTIME
TOPICAL | Status: DC | PRN
  Administered 2015-11-19: 1 via TOPICAL

## 2015-11-19 MED ORDER — THROMBIN 5000 UNITS EX SOLR
CUTANEOUS | Status: DC | PRN
Start: 2015-11-19 — End: 2015-11-19
  Administered 2015-11-19: 5000 [IU] via TOPICAL

## 2015-11-19 MED ORDER — ONDANSETRON HCL 4 MG/2ML IJ SOLN
INTRAMUSCULAR | Status: AC
  Filled 2015-11-19: qty 2

## 2015-11-19 MED ORDER — WHITE PETROLATUM GEL
Status: AC
  Administered 2015-11-19: 1
  Filled 2015-11-19: qty 1

## 2015-11-19 MED ORDER — FENTANYL CITRATE (PF) 100 MCG/2ML IJ SOLN
25.0000 ug | INTRAMUSCULAR | Status: DC | PRN
  Administered 2015-11-19: 50 ug via INTRAVENOUS

## 2015-11-19 MED ORDER — DIPHENHYDRAMINE HCL 50 MG/ML IJ SOLN
INTRAMUSCULAR | Status: AC
  Filled 2015-11-19: qty 1

## 2015-11-19 MED ORDER — MECLIZINE HCL 25 MG PO TABS
25.0000 mg | ORAL_TABLET | Freq: Three times a day (TID) | ORAL | Status: DC | PRN
  Filled 2015-11-19: qty 1

## 2015-11-19 MED ORDER — SUGAMMADEX SODIUM 200 MG/2ML IV SOLN
INTRAVENOUS | Status: DC | PRN
  Administered 2015-11-19: 208.6 mg via INTRAVENOUS

## 2015-11-19 MED ORDER — MIDAZOLAM HCL 2 MG/2ML IJ SOLN
INTRAMUSCULAR | Status: DC | PRN
  Administered 2015-11-19: 2 mg via INTRAVENOUS

## 2015-11-19 MED ORDER — FENTANYL CITRATE (PF) 100 MCG/2ML IJ SOLN
INTRAMUSCULAR | Status: DC | PRN
  Administered 2015-11-19: 100 ug via INTRAVENOUS

## 2015-11-19 MED ORDER — FENTANYL CITRATE (PF) 100 MCG/2ML IJ SOLN
INTRAMUSCULAR | Status: AC
  Filled 2015-11-19: qty 4

## 2015-11-19 MED ORDER — OXYCODONE HCL 5 MG PO TABS
5.0000 mg | ORAL_TABLET | ORAL | Status: DC | PRN
  Administered 2015-11-19 – 2015-11-21 (×6): 5 mg via ORAL
  Filled 2015-11-19 (×6): qty 1

## 2015-11-19 MED ORDER — VANCOMYCIN HCL 1000 MG IV SOLR
INTRAVENOUS | Status: AC
  Filled 2015-11-19: qty 1000

## 2015-11-19 MED ORDER — ROCURONIUM BROMIDE 10 MG/ML (PF) SYRINGE
PREFILLED_SYRINGE | INTRAVENOUS | Status: AC
  Filled 2015-11-19: qty 10

## 2015-11-19 MED ORDER — BUPIVACAINE-EPINEPHRINE 0.5% -1:200000 IJ SOLN
INTRAMUSCULAR | Status: DC | PRN
  Administered 2015-11-19: 20 mL

## 2015-11-19 MED ORDER — VANCOMYCIN HCL 10 G IV SOLR
1250.0000 mg | Freq: Two times a day (BID) | INTRAVENOUS | Status: AC
  Administered 2015-11-19 – 2015-11-20 (×2): 1250 mg via INTRAVENOUS
  Filled 2015-11-19 (×4): qty 1250

## 2015-11-19 MED ORDER — SUGAMMADEX SODIUM 200 MG/2ML IV SOLN
INTRAVENOUS | Status: AC
  Filled 2015-11-19: qty 2

## 2015-11-19 MED ORDER — ACETAMINOPHEN 10 MG/ML IV SOLN
INTRAVENOUS | Status: DC | PRN
  Administered 2015-11-19: 1000 mg via INTRAVENOUS

## 2015-11-19 MED ORDER — SCOPOLAMINE 1 MG/3DAYS TD PT72
MEDICATED_PATCH | TRANSDERMAL | Status: DC | PRN
  Administered 2015-11-19: 1 via TRANSDERMAL

## 2015-11-19 MED ORDER — 0.9 % SODIUM CHLORIDE (POUR BTL) OPTIME
TOPICAL | Status: DC | PRN
  Administered 2015-11-19: 1000 mL

## 2015-11-19 MED ORDER — LABETALOL HCL 5 MG/ML IV SOLN: 10.0000 mg | INTRAVENOUS | Status: DC | PRN

## 2015-11-19 MED ORDER — EPHEDRINE SULFATE 50 MG/ML IJ SOLN
INTRAMUSCULAR | Status: DC | PRN
  Administered 2015-11-19: 10 mg via INTRAVENOUS

## 2015-11-19 MED ORDER — BUTALBITAL-APAP-CAFFEINE 50-325-40 MG PO TABS: 1.0000 | ORAL_TABLET | Freq: Four times a day (QID) | ORAL | Status: DC | PRN

## 2015-11-19 MED ORDER — MIDAZOLAM HCL 2 MG/2ML IJ SOLN
INTRAMUSCULAR | Status: AC
  Filled 2015-11-19: qty 2

## 2015-11-19 MED ORDER — FENTANYL CITRATE (PF) 100 MCG/2ML IJ SOLN
25.0000 ug | INTRAMUSCULAR | Status: DC | PRN
  Administered 2015-11-19 (×2): 50 ug via INTRAVENOUS

## 2015-11-19 MED ORDER — ACETAMINOPHEN 325 MG PO TABS: 650.0000 mg | ORAL_TABLET | ORAL | Status: DC | PRN

## 2015-11-19 MED ORDER — DIPHENHYDRAMINE HCL 50 MG/ML IJ SOLN
INTRAMUSCULAR | Status: DC | PRN
  Administered 2015-11-19: 50 mg via INTRAVENOUS

## 2015-11-19 MED ORDER — TIZANIDINE HCL 4 MG PO TABS
4.0000 mg | ORAL_TABLET | Freq: Four times a day (QID) | ORAL | Status: DC | PRN
Start: 2015-11-19 — End: 2015-11-21
  Administered 2015-11-20: 4 mg via ORAL
  Filled 2015-11-19: qty 1

## 2015-11-19 MED ORDER — THROMBIN 5000 UNITS EX SOLR: CUTANEOUS | Status: DC | PRN

## 2015-11-19 MED ORDER — PROPOFOL 10 MG/ML IV BOLUS
INTRAVENOUS | Status: AC
  Filled 2015-11-19: qty 40

## 2015-11-19 MED ORDER — PROPOFOL 10 MG/ML IV BOLUS
INTRAVENOUS | Status: DC | PRN
  Administered 2015-11-19: 180 mg via INTRAVENOUS

## 2015-11-19 MED ORDER — SODIUM CHLORIDE 0.9 % IR SOLN
Status: DC | PRN
  Administered 2015-11-19: 500 mL

## 2015-11-19 MED ORDER — EPHEDRINE 5 MG/ML INJ
INTRAVENOUS | Status: AC
  Filled 2015-11-19: qty 10

## 2015-11-19 MED ORDER — DOCUSATE SODIUM 100 MG PO CAPS
100.0000 mg | ORAL_CAPSULE | Freq: Two times a day (BID) | ORAL | Status: DC
  Administered 2015-11-19 – 2015-11-20 (×4): 100 mg via ORAL
  Filled 2015-11-19 (×4): qty 1

## 2015-11-19 SURGICAL SUPPLY — 64 items
BAG DECANTER FOR FLEXI CONT (MISCELLANEOUS) ×3 IMPLANT
BENZOIN TINCTURE PRP APPL 2/3 (GAUZE/BANDAGES/DRESSINGS) ×3 IMPLANT
BLADE CLIPPER SURG (BLADE) ×3 IMPLANT
BOOT SUTURE AID YELLOW STND (SUTURE) ×3 IMPLANT
BRUSH SCRUB EZ PLAIN DRY (MISCELLANEOUS) ×6 IMPLANT
BUR ACORN 6.0 PRECISION (BURR) ×2 IMPLANT
BUR ACORN 6.0MM PRECISION (BURR) ×1
CANISTER SUCT 3000ML PPV (MISCELLANEOUS) ×3 IMPLANT
CLIP RANEY DISP (INSTRUMENTS) IMPLANT
CLOSURE WOUND 1/2 X4 (GAUZE/BANDAGES/DRESSINGS) ×1
DRAPE INCISE IOBAN 85X60 (DRAPES) ×3 IMPLANT
DRAPE ORTHO SPLIT 77X108 STRL (DRAPES) ×4
DRAPE POUCH INSTRU U-SHP 10X18 (DRAPES) ×3 IMPLANT
DRAPE SURG 17X23 STRL (DRAPES) ×18 IMPLANT
DRAPE SURG ORHT 6 SPLT 77X108 (DRAPES) ×2 IMPLANT
ELECT REM PT RETURN 9FT ADLT (ELECTROSURGICAL) ×3
ELECTRODE REM PT RTRN 9FT ADLT (ELECTROSURGICAL) ×1 IMPLANT
GAUZE SPONGE 4X4 12PLY STRL (GAUZE/BANDAGES/DRESSINGS) ×6 IMPLANT
GAUZE SPONGE 4X4 16PLY XRAY LF (GAUZE/BANDAGES/DRESSINGS) ×3 IMPLANT
GLOVE BIO SURGEON STRL SZ8 (GLOVE) IMPLANT
GLOVE BIO SURGEON STRL SZ8.5 (GLOVE) IMPLANT
GLOVE BIOGEL PI IND STRL 7.0 (GLOVE) ×2 IMPLANT
GLOVE BIOGEL PI IND STRL 7.5 (GLOVE) ×3 IMPLANT
GLOVE BIOGEL PI IND STRL 8.5 (GLOVE) ×1 IMPLANT
GLOVE BIOGEL PI INDICATOR 7.0 (GLOVE) ×4
GLOVE BIOGEL PI INDICATOR 7.5 (GLOVE) ×6
GLOVE BIOGEL PI INDICATOR 8.5 (GLOVE) ×2
GLOVE EXAM NITRILE LRG STRL (GLOVE) IMPLANT
GLOVE EXAM NITRILE XL STR (GLOVE) IMPLANT
GLOVE EXAM NITRILE XS STR PU (GLOVE) IMPLANT
GLOVE SURG SS PI 7.0 STRL IVOR (GLOVE) ×3 IMPLANT
GLOVE SURG SS PI 8.0 STRL IVOR (GLOVE) ×3 IMPLANT
GOWN STRL REUS W/ TWL LRG LVL3 (GOWN DISPOSABLE) ×2 IMPLANT
GOWN STRL REUS W/ TWL XL LVL3 (GOWN DISPOSABLE) ×2 IMPLANT
GOWN STRL REUS W/TWL LRG LVL3 (GOWN DISPOSABLE) ×4
GOWN STRL REUS W/TWL XL LVL3 (GOWN DISPOSABLE) ×4
KIT BASIN OR (CUSTOM PROCEDURE TRAY) ×3 IMPLANT
KIT ROOM TURNOVER OR (KITS) ×3 IMPLANT
NEEDLE HYPO 22GX1.5 SAFETY (NEEDLE) ×3 IMPLANT
NS IRRIG 1000ML POUR BTL (IV SOLUTION) ×3 IMPLANT
PACK LAMINECTOMY NEURO (CUSTOM PROCEDURE TRAY) ×3 IMPLANT
PAD ARMBOARD 7.5X6 YLW CONV (MISCELLANEOUS) ×9 IMPLANT
SHEATH PERITONEAL INTRO 46 (MISCELLANEOUS) ×3 IMPLANT
SHEATH PERITONEAL INTRO 61 (MISCELLANEOUS) IMPLANT
SPONGE LAP 4X18 X RAY DECT (DISPOSABLE) IMPLANT
SPONGE SURGIFOAM ABS GEL SZ50 (HEMOSTASIS) ×3 IMPLANT
STAPLER SKIN PROX WIDE 3.9 (STAPLE) ×3 IMPLANT
STRIP CLOSURE SKIN 1/2X4 (GAUZE/BANDAGES/DRESSINGS) ×2 IMPLANT
SUT ETHILON 3 0 PS 1 (SUTURE) IMPLANT
SUT NURALON 4 0 TR CR/8 (SUTURE) IMPLANT
SUT SILK 0 TIES 10X30 (SUTURE) ×3 IMPLANT
SUT SILK 3 0 SH 30 (SUTURE) ×3 IMPLANT
SUT VIC AB 1 CT1 18XBRD ANBCTR (SUTURE) ×1 IMPLANT
SUT VIC AB 1 CT1 8-18 (SUTURE) ×2
SUT VIC AB 2-0 CP2 18 (SUTURE) ×3 IMPLANT
SUT VIC AB 3-0 SH 8-18 (SUTURE) ×6 IMPLANT
SYR CONTROL 10ML LL (SYRINGE) ×3 IMPLANT
TAPE CLOTH SURG 4X10 WHT LF (GAUZE/BANDAGES/DRESSINGS) ×6 IMPLANT
TOWEL OR 17X24 6PK STRL BLUE (TOWEL DISPOSABLE) ×6 IMPLANT
TOWEL OR 17X26 10 PK STRL BLUE (TOWEL DISPOSABLE) ×3 IMPLANT
TRAY FOLEY W/METER SILVER 16FR (SET/KITS/TRAYS/PACK) IMPLANT
UNDERPAD 30X30 (UNDERPADS AND DIAPERS) IMPLANT
VALVE RT ANGLE UNITIZE DIST (Valve) ×3 IMPLANT
WATER STERILE IRR 1000ML POUR (IV SOLUTION) ×3 IMPLANT

## 2015-11-19 NOTE — Progress Notes (Signed)
Subjective:  The patient is somnolent but easily arousable. She is in no apparent distress. She looks well.  Objective: Vital signs in last 24 hours: Temp:  [97.1 F (36.2 C)-98.2 F (36.8 C)] 97.1 F (36.2 C) (09/25 1319) Pulse Rate:  [72] 72 (09/25 0744) Resp:  [20] 20 (09/25 0744) BP: (139)/(64) 139/64 (09/25 0744) SpO2:  [99 %] 99 % (09/25 0744) Weight:  [104.3 kg (230 lb)] 104.3 kg (230 lb) (09/25 0744)  Intake/Output from previous day: No intake/output data recorded. Intake/Output this shift: Total I/O In: 1100 [I.V.:1100] Out: 20 [Blood:20]  Physical exam the patient is, but easily arousable. She moves all 4 extremities well. Her pupils are equal.  Lab Results:  Recent Labs  11/19/15 0836  WBC 6.2  HGB 12.2  HCT 37.7  PLT 192   BMET  Recent Labs  11/19/15 0836  NA 137  K 3.4*  CL 106  CO2 24  GLUCOSE 96  BUN 11  CREATININE 0.96  CALCIUM 8.9    Studies/Results: No results found.  Assessment/Plan: The patient is doing well.  LOS: 0 days     Lizabeth Fellner D 11/19/2015, 1:32 PM

## 2015-11-19 NOTE — Progress Notes (Signed)
Pt epeats questions over and over, will answer and she repeats self

## 2015-11-19 NOTE — Anesthesia Preprocedure Evaluation (Addendum)
Anesthesia Evaluation  Patient identified by MRN, date of birth, ID band Patient awake    Reviewed: Allergy & Precautions, NPO status , Patient's Chart, lab work & pertinent test results  Airway Mallampati: II  TM Distance: >3 FB Neck ROM: Full    Dental  (+) Teeth Intact, Dental Advisory Given   Pulmonary    breath sounds clear to auscultation       Cardiovascular hypertension,  Rhythm:Regular Rate:Normal     Neuro/Psych    GI/Hepatic   Endo/Other    Renal/GU      Musculoskeletal   Abdominal   Peds  Hematology   Anesthesia Other Findings   Reproductive/Obstetrics                            Anesthesia Physical Anesthesia Plan  ASA: III  Anesthesia Plan:    Post-op Pain Management:    Induction: Intravenous  Airway Management Planned: Oral ETT  Additional Equipment:   Intra-op Plan:   Post-operative Plan: Extubation in OR  Informed Consent: I have reviewed the patients History and Physical, chart, labs and discussed the procedure including the risks, benefits and alternatives for the proposed anesthesia with the patient or authorized representative who has indicated his/her understanding and acceptance.   Dental advisory given  Plan Discussed with: CRNA and Anesthesiologist  Anesthesia Plan Comments:         Anesthesia Quick Evaluation

## 2015-11-19 NOTE — Op Note (Signed)
Brief history: The patient is a 40 year old white female who has complained of chronic headaches. She has seen multiple physicians and has been diagnosed with pseudotumor cerebri. She's had multiple lumbar punctures with elevated intracranial pressures. She had a fairly unremarkable brain MRI. I discussed the situation with the patient. We discussed the various treatment options including surgery. She has weighed the risks, benefits, and alternatives to surgery and decided to proceed with the placement of a right ventriculoperitoneal shunt.  Preop diagnosis: Pseudotumor cerebri; benign intracranial hypertension  Postop diagnosis: The same  Procedure: Placement of right retroauricular Codman programmable ventriculoperitoneal shunt with initial setting at 100 mm mercury  Surgeon: Dr. Delma OfficerJeff Olajuwon Fosdick  Assistant: Dr. Bevely Palmeritty  Anesthesia: Gen. endotracheal  Estimated blood loss: 100 mL  Specimens: None  Drains: None  Complications: None  Description of procedure: The patient was brought to the operating room by the anesthesia team. General endotracheal anesthesia was induced. The patient remained in the supine position. A roll was placed under her right shoulder. Her head was turned to the left exposing her right scalp. The patient's right retroauricular region was then shaved with clippers. This region as well as the neck thorax and abdomen was prepared with Betadine scrub, and Betadine solution. Sterile drapes were applied. I then injected the area to be incised with Marcaine with epinephrine solution. I used a scalpel to make an incision in the patient's right upper quadrant of her abdomen. We dissected down to the anterior rectus sheath. We divided the anterior rectus sheath. We did second through the rectus muscle and identified the posterior rectus sheath. We divided it with scissors and then identified the peritoneum. We used scissors to crit on the peritoneum and enter into the peritoneal  cavity.  We then made an incision in the patient's right retroauricular region. I used the cerebellar retractor for exposure. We created a subcutaneous pocket for the shunt valve with the uterine packing forceps. I then used a high-speed drill to create a burr hole. We widened the burr hole with the Kerrison punches. I coagulated the underlying dura. We passed the peritoneal catheter from the scalp incision to the abdominal incision using the shunt passer. I then inserted the ventricular catheter into the patient's ventricular system. We got CSF on the second pass but then quickly there was bloody drainage from the catheter. We had to remove the catheter. After several passes were able to get spinal fluid. We connected the ventricular catheter to the shunt valve. We secured the ventricular catheter to the shunt valve with Vicryl suture. We seated the valve into the subcutaneous pocket. We removed the slack from the peritoneal catheter. There was good flow of CSF through the distal end of the peritoneal catheter. We then inserted the distal end of the peritoneal catheter into the peritoneal cavity. We removed the retractors and reapproximated patient's anterior and posterior rectus sheath with interrupted 0 Vicryl suture. We reapproximated the subcutaneous tissue with interrupted 2-0 Vicryl suture. We reapproximated the skin with stainless steel staples. We then closed the scalp wound with 2-0 Vicryl interrupted sutures in his galea. We reapproximated the skin with stainless steel staples. The wounds were then coated with bacitracin ointment. Sterile dressings were applied. The drapes were removed. The patient was subsequently extubated by the anesthesia team and transported to the post anesthesia care unit in stable condition. By report all sponge, and stomach, and needle counts were correct at the end of this case.

## 2015-11-19 NOTE — Transfer of Care (Signed)
Immediate Anesthesia Transfer of Care Note  Patient: Sandra [name]  Procedure(s) Performed: Procedure(s) with comments: VENTRICULAR-PERITONEAL SHUNT RIGHT (Right) - Right  Patient Location: PACU  Anesthesia Type:General  Level of Consciousness: awake, alert  and oriented  Airway & Oxygen Therapy: Patient Spontanous Breathing and Patient connected to nasal cannula oxygen  Post-op Assessment: Report given to RN and Post -op Vital signs reviewed and stable  Post vital signs: Reviewed and stable  Last Vitals:  Vitals:   11/19/15 0744 11/19/15 1319  BP: 139/64   Pulse: 72   Resp: 20   Temp: 36.8 C 36.2 C    Last Pain:  Vitals:   11/19/15 0833  TempSrc:   PainSc: 7       Patients Stated Pain Goal: 3 (11/15/15 1128)  Complications: No apparent anesthesia complications

## 2015-11-19 NOTE — Anesthesia Postprocedure Evaluation (Signed)
Anesthesia Post Note  Patient: Sandra [name]  Procedure(s) Performed: Procedure(s) (LRB): VENTRICULAR-PERITONEAL SHUNT RIGHT (Right)  Patient location during evaluation: PACU Anesthesia Type: General Level of consciousness: awake, awake and alert and oriented Pain management: pain level controlled Vital Signs Assessment: post-procedure vital signs reviewed and stable Respiratory status: spontaneous breathing, nonlabored ventilation and respiratory function stable Cardiovascular status: blood pressure returned to baseline Anesthetic complications: no    Last Vitals:  Vitals:   11/19/15 1510 11/19/15 1600  BP: 126/82 125/65  Pulse: (!) 105 66  Resp: 16 10  Temp:  36.8 C    Last Pain:  Vitals:   11/19/15 1600  TempSrc: Oral  PainSc:                  [name]

## 2015-11-19 NOTE — Progress Notes (Signed)
eLink Physician-Brief Progress Note Patient Name: Sandra PomfretChristy T Gill DOB: 1975-07-27 MRN: [id number]   Date of Service  11/19/2015  HPI/Events of Note  40 yo woman, hx chronic HA and elevated ICP. S/p VP shunt placement. To ICU extubated and on RA. VS reviewed and normal. No intervention at this time.   eICU Interventions       Intervention Category Evaluation Type: New Patient Evaluation  Takeira Yanes S. 11/19/2015, 4:55 PM

## 2015-11-19 NOTE — Progress Notes (Signed)
Vancomycin per Pharmacy for surgical prophylaxis  40 YO F s/p VP shunt, to start vancomycin for surgical prophylaxis for 24 hrs. Scr 0.96, est. crcl ~ 90 ml/min. Received pre-op dose 1g vancomycin at ~ 1100. No drain per chart.  Plan: Vancomycin 1250mg  12 hrs x 2 doses at 2000. Pharmacy sign off.  Thanks.   Bayard HuggerMei Katrinna Travieso, PharmD, BCPS  Clinical Pharmacist  Pager: [phone]

## 2015-11-19 NOTE — Anesthesia Procedure Notes (Signed)
Procedure Name: Intubation Date/Time: 11/19/2015 11:15 AM Performed by: Eligha Bridegroom Pre-anesthesia Checklist: Patient identified, Emergency Drugs available, Suction available, Patient being monitored and Timeout performed Patient Re-evaluated:Patient Re-evaluated prior to inductionOxygen Delivery Method: Circle system utilized Preoxygenation: Pre-oxygenation with 100% oxygen Intubation Type: IV induction Ventilation: Mask ventilation without difficulty and Oral airway inserted - appropriate to patient size Laryngoscope Size: Mac and 4 Grade View: Grade I Tube type: Oral Tube size: 7.0 mm Number of attempts: 1 Airway Equipment and Method: Stylet and LTA kit utilized Placement Confirmation: ETT inserted through vocal cords under direct vision,  positive ETCO2 and breath sounds checked- equal and bilateral Secured at: 21 cm Tube secured with: Tape Dental Injury: Teeth and Oropharynx as per pre-operative assessment

## 2015-11-19 NOTE — Progress Notes (Signed)
Subjective:  The patient is alert and pleasant. She looks well. She is in no apparent distress.  Objective: Vital signs in last 24 hours: Temp:  [96.8 F (36 C)-98.3 F (36.8 C)] 98.3 F (36.8 C) (09/25 1600) Pulse Rate:  [66-105] 73 (09/25 1800) Resp:  [10-22] 14 (09/25 1800) BP: (125-142)/(53-83) 130/67 (09/25 1800) SpO2:  [96 %-100 %] 98 % (09/25 1800) Weight:  [104.3 kg (230 lb)] 104.3 kg (230 lb) (09/25 0744)  Intake/Output from previous day: No intake/output data recorded. Intake/Output this shift: Total I/O In: 1385 [P.O.:60; I.V.:1325] Out: 1220 [Urine:1200; Blood:20]  Physical exam the patient is alert and oriented 3. Her speech is normal. She is moving all 4 extremities well. She looks well.  Lab Results:  Recent Labs  11/19/15 0836  WBC 6.2  HGB 12.2  HCT 37.7  PLT 192   BMET  Recent Labs  11/19/15 0836  NA 137  K 3.4*  CL 106  CO2 24  GLUCOSE 96  BUN 11  CREATININE 0.96  CALCIUM 8.9    Studies/Results: No results found.  Assessment/Plan: The patient is doing well. She will probably go home tomorrow.  LOS: 0 days     Kellina Dreese D 11/19/2015, 6:47 PM

## 2015-11-19 NOTE — H&P (Signed)
Subjective: The patient is a 40 year old white female who has complained of chronic headaches. A brain MRI was unremarkable. She's had several lumbar punctures which have demonstrated increased intracranial pressure. I discussed the various treatment options with the patient including surgery. She has decided to proceed with placement of a ventriculoperitoneal shunt after weighing the risks, benefits, and alternatives.  Past Medical History:  Diagnosis Date  . Allergy    takes Claritin daily  . Anxiety   . Arthritis   . Chronic back pain    buldging/herniated disc,stenosis,mild scoliosis/arthritis  . Complication of anesthesia    itching  . Constipation    takes Linzess as needed  . Depression    takes Fluoxetine daily  . Dyspnea   . Fibromyalgia    takes Lyrica daily  . GERD (gastroesophageal reflux disease)    with esophageal stricture.Takes Protonix daily  . History of bronchitis    1-2 yrs ago  . History of gastric ulcer   . History of hiatal hernia    stomach ulcer ==  had one at age 40  . Hypertension    not on any meds  . Joint pain   . Joint swelling   . Migraines    takes Maxalt as needed.Last migraine a wk ago  . Muscle spasm    takes Zanaflex as needed  . Nocturia   . Pneumonia    hx of  . PONV (postoperative nausea and vomiting)    usually phenergan helps  . POTS (postural orthostatic tachycardia syndrome)    possible, episodes of sinus tachycardis, holter in 2007 with sinus tachycardia with rates as high as 160  . S/P Botox injection    for migraines  . Spinal headache 2016   blood patch   . Vertigo    takes Anitvert as needed  . Weakness    numbness and tingling in left leg    Past Surgical History:  Procedure Laterality Date  . ABDOMINAL HYSTERECTOMY    . BACK SURGERY    . BLADDER SURGERY     bladder tact  . BREAST SURGERY Left    Lumpectomy  . CARDIAC CATHETERIZATION  2010  . CHOLECYSTECTOMY    . COLONOSCOPY    . CYSTOSCOPY W/ URETERAL  STENT REMOVAL      stent placed and removed  . ESOPHAGEAL DILATION     4-5 times  . fluid removal     from brain 4-5 times.  last time 8-9 mths ago @ [facility]  . LUMBAR LAMINECTOMY/DECOMPRESSION MICRODISCECTOMY Left 01/11/2014   Procedure: LUMBAR LAMINECTOMY/DECOMPRESSION MICRODISCECTOMY 1 LEVEL LEFT LUMBAR TWO/THREE;  Surgeon: Tressie Stalker, MD;  Location: [facility];  Service: Neurosurgery;  Laterality: Left;  . TUBAL LIGATION      Allergies  Allergen Reactions  . Ambien [Zolpidem Tartrate] Rash and Other (See Comments)    Pt states she cannot walk after taking this medication  . Penicillins Hives and Swelling    Has patient had a PCN reaction causing immediate rash, facial/tongue/throat swelling, SOB or lightheadedness with hypotension: Yes Has patient had a PCN reaction causing severe rash involving mucus membranes or skin necrosis: Yes Has patient had a PCN reaction that required hospitalization Yes Has patient had a PCN reaction occurring within the last 10 years: Yes If all of the above answers are "NO", then may proceed with Cephalosporin use.   . Topamax [Topiramate] Other (See Comments)    Gives her kidney stones  . Chlorhexidine Gluconate Itching and Rash    "  It felt bees were stinging me"  . Hydrocodone-Acetaminophen Itching    States only Vicodin makes her itch  . Latex Rash  . Versed [Midazolam] Itching    Social History  Substance Use Topics  . Smoking status: Never Smoker  . Smokeless tobacco: Never Used  . Alcohol use No    Family History  Problem Relation Age of Onset  . Kidney cancer Mother   . Diabetes Mother   . Ulcers Father   . Coronary artery disease Father     pci at age 41  . Prostate cancer Maternal Grandfather   . Pancreatic cancer Maternal Grandmother   . Lung cancer Paternal Grandmother     metastatic  . Aortic aneurysm     Prior to Admission medications   Medication Sig Start Date End Date Taking? Authorizing Provider   acetaZOLAMIDE (DIAMOX) 500 MG capsule Take 2 capsules (1,000 mg total) by mouth 2 (two) times daily. 09/04/14  Yes Anson FretAntonia B Ahern, MD  butalbital-acetaminophen-caffeine (FIORICET, ESGIC) [phone] MG tablet Take 1 tablet by mouth every 6 (six) hours as needed for headache. 04/06/15  Yes Vassie Lollarlos Madera, MD  diazepam (VALIUM) 5 MG tablet Take 1 tablet (5 mg total) by mouth every 6 (six) hours as needed for muscle spasms. 01/12/14  Yes Tressie StalkerJeffrey Kharlie Bring, MD  diphenhydrAMINE (BENADRYL) 25 mg capsule Take 25 mg by mouth every 6 (six) hours as needed for itching.   Yes Historical Provider, MD  FLUoxetine (PROZAC) 40 MG capsule Take 40 mg by mouth daily.   Yes Historical Provider, MD  ibuprofen (ADVIL,MOTRIN) 800 MG tablet TAKE ONE TABLET BY MOUTH EVERY 8 HOURS AS NEEDED 04/24/14  Yes Huston FoleySaima Athar, MD  loratadine (CLARITIN) 10 MG tablet Take 10 mg by mouth daily.   Yes Historical Provider, MD  meclizine (ANTIVERT) 25 MG tablet Take 1 tablet (25 mg total) by mouth 3 (three) times daily as needed for dizziness. 08/11/14  Yes Anson FretAntonia B Ahern, MD  oxyCODONE-acetaminophen (PERCOCET) 10-325 MG per tablet Take 1 tablet by mouth every 4 (four) hours as needed for pain.   Yes Historical Provider, MD  pantoprazole (PROTONIX) 40 MG tablet Take 40 mg by mouth daily.  08/13/12  Yes Historical Provider, MD  pregabalin (LYRICA) 75 MG capsule Take 75 mg by mouth 3 (three) times daily.   Yes Historical Provider, MD  promethazine (PHENERGAN) 25 MG tablet Take 1 tablet (25 mg total) by mouth every 8 (eight) hours as needed for nausea or vomiting. 09/04/14  Yes Anson FretAntonia B Ahern, MD  tiZANidine (ZANAFLEX) 4 MG tablet Take 4 mg by mouth every 6 (six) hours as needed for muscle spasms.   Yes Historical Provider, MD  cyclobenzaprine (FLEXERIL) 10 MG tablet Take 1 tablet (10 mg total) by mouth at bedtime. Patient not taking: Reported on 11/14/2015 08/11/14   Huston FoleySaima Athar, MD  [facility] 1 patch onto the skin daily as needed. pain  11/14/13   Historical Provider, MD  Linaclotide Karlene Einstein(LINZESS) 145 MCG CAPS capsule Take 145 mcg by mouth daily as needed (for constipation).    Historical Provider, MD  predniSONE (DELTASONE) 20 MG tablet Take two tablets by mouth daily X 3 days; then 1 tablet by mouth daily X 3 days; then 1/2 tablet by mouth daily X 3 days and stop prednisone. Patient not taking: Reported on 11/14/2015 04/06/15   Vassie Lollarlos Madera, MD  rizatriptan (MAXALT-MLT) 5 MG disintegrating tablet Take 5 mg by mouth daily as needed for migraine. May repeat in  2 hours if needed    Historical Provider, MD     Review of Systems  Positive ROS: As above  All other systems have been reviewed and were otherwise negative with the exception of those mentioned in the HPI and as above.  Objective: Vital signs in last 24 hours: Temp:  [98.2 F (36.8 C)] 98.2 F (36.8 C) (09/25 0744) Pulse Rate:  [72] 72 (09/25 0744) Resp:  [20] 20 (09/25 0744) BP: (139)/(64) 139/64 (09/25 0744) SpO2:  [99 %] 99 % (09/25 0744) Weight:  [104.3 kg (230 lb)] 104.3 kg (230 lb) (09/25 0744)  General Appearance: Alert, cooperative, no distress, Head: Normocephalic, without obvious abnormality, atraumatic Eyes: PERRL, conjunctiva/corneas clear, EOM's intact,    Ears: Normal  Throat: Normal  Neck: Supple, symmetrical, trachea midline, no adenopathy; thyroid: No enlargement/tenderness/nodules; no carotid bruit or JVD Back: Symmetric, no curvature, ROM normal, no CVA tenderness Lungs: Clear to auscultation bilaterally, respirations unlabored Heart: Regular rate and rhythm, no murmur, rub or gallop Abdomen: Soft, non-tender,, no masses, no organomegaly Extremities: Extremities normal, atraumatic, no cyanosis or edema Pulses: 2+ and symmetric all extremities Skin: Skin color, texture, turgor normal, no rashes or lesions  NEUROLOGIC:   Mental status: alert and oriented, no aphasia, good attention span, Fund of knowledge/ memory ok Motor Exam - grossly  normal Sensory Exam - grossly normal Reflexes:  Coordination - grossly normal Gait - grossly normal Balance - grossly normal Cranial Nerves: I: smell Not tested  II: visual acuity  OS: Normal  OD: Normal   II: visual fields Full to confrontation  II: pupils Equal, round, reactive to light  III,VII: ptosis None  III,IV,VI: extraocular muscles  Full ROM  V: mastication Normal  V: facial light touch sensation  Normal  V,VII: corneal reflex  Present  VII: facial muscle function - upper  Normal  VII: facial muscle function - lower Normal  VIII: hearing Not tested  IX: soft palate elevation  Normal  IX,X: gag reflex Present  XI: trapezius strength  5/5  XI: sternocleidomastoid strength 5/5  XI: neck flexion strength  5/5  XII: tongue strength  Normal    Data Review Lab Results  Component Value Date   WBC 6.2 11/19/2015   HGB 12.2 11/19/2015   HCT 37.7 11/19/2015   MCV 90.6 11/19/2015   PLT 192 11/19/2015   Lab Results  Component Value Date   NA 137 11/19/2015   K 3.4 (L) 11/19/2015   CL 106 11/19/2015   CO2 24 11/19/2015   BUN 11 11/19/2015   CREATININE 0.96 11/19/2015   GLUCOSE 96 11/19/2015   No results found for: INR, PROTIME  Assessment/Plan: Pseudotumor cerebri, intracranial hypertension: I have discussed situation with the patient. We have discussed the various treatment options including surgery. I have described the surgical treatment option of a placement of a right ventriculoperitoneal shunt. We have discussed the risks, benefits, alternatives, expected postoperative course, and likelihood of achieving her goals with surgery. I have answered all her questions. She has decided to proceed with surgery.   Auburn Hert D 11/19/2015 10:57 AM

## 2015-11-20 ENCOUNTER — Encounter (HOSPITAL_COMMUNITY): Payer: Self-pay | Admitting: Neurosurgery

## 2015-11-20 ENCOUNTER — Inpatient Hospital Stay (HOSPITAL_COMMUNITY): Payer: Medicaid Other

## 2015-11-20 MED ORDER — DOCUSATE SODIUM 100 MG PO CAPS: 100.0000 mg | ORAL_CAPSULE | Freq: Two times a day (BID) | ORAL | 0 refills | Status: DC

## 2015-11-20 MED ORDER — OXYCODONE-ACETAMINOPHEN 10-325 MG PO TABS: 1.0000 | ORAL_TABLET | ORAL | 0 refills | Status: DC | PRN

## 2015-11-20 NOTE — Discharge Summary (Signed)
Physician Discharge Summary  Patient ID: Sandra PomfretChristy T [name] MRN: [id number] DOB/AGE: 1975-07-20 40 y.o.  Admit date: 11/19/2015 Discharge date: 11/20/2015  Admission Diagnoses:Pseudotumor cerebri, benign intracranial hypertension  Discharge Diagnoses: The same Active Problems:   Pseudotumor cerebri   Shunt malfunction   Discharged Condition: good  Hospital Course: I placed a right ventriculoperitoneal shunt on the patient on 11/19/2015.  The patient's postoperative course was unremarkable. On postoperative day #1 the patient looked and felt well. She thinks she wants to go home later on today. The  patient, and her husband, were given written and oral discharge instructions.  The patient's postoperative head CT demonstrates a small amount of blood in the ventricular system. The ventricular catheter is in the lateral ventricle and the quadrigeminal plate cistern.  Consults: None Significant Diagnostic Studies: None Treatments: Placement of a right ventriculoperitoneal shunt, Codman programmable valve set at 100 mmHg Discharge Exam: Blood pressure (!) 89/49, pulse 60, temperature 98 F (36.7 C), temperature source Oral, resp. rate 18, height 5\' 6"  (1.676 m), weight 104.3 kg (230 lb), SpO2 100 %. The patient is alert and pleasant. Her dressings are clean and dry. Her speech, strength and cranial nerve exam is normal. There is no Parinaud syndrome  Disposition: Home later today     Medication List    STOP taking these medications   cyclobenzaprine 10 MG tablet Commonly known as:  FLEXERIL   diazepam 5 MG tablet Commonly known as:  VALIUM   predniSONE 20 MG tablet Commonly known as:  DELTASONE     TAKE these medications   acetaZOLAMIDE 500 MG capsule Commonly known as:  DIAMOX Take 2 capsules (1,000 mg total) by mouth 2 (two) times daily.   BENADRYL 25 mg capsule Generic drug:  diphenhydrAMINE Take 25 mg by mouth every 6 (six) hours as needed for itching.    butalbital-acetaminophen-caffeine 50-325-40 MG tablet Commonly known as:  FIORICET, ESGIC Take 1 tablet by mouth every 6 (six) hours as needed for headache.   docusate sodium 100 MG capsule Commonly known as:  COLACE Take 1 capsule (100 mg total) by mouth 2 (two) times daily.   FLECTOR 1.3 % [facility] Generic drug:  diclofenac Place 1 patch onto the skin daily as needed. pain   FLUoxetine 40 MG capsule Commonly known as:  PROZAC Take 40 mg by mouth daily.   ibuprofen 800 MG tablet Commonly known as:  ADVIL,MOTRIN TAKE ONE TABLET BY MOUTH EVERY 8 HOURS AS NEEDED   LINZESS 145 MCG Caps capsule Generic drug:  linaclotide Take 145 mcg by mouth daily as needed (for constipation).   loratadine 10 MG tablet Commonly known as:  CLARITIN Take 10 mg by mouth daily.   meclizine 25 MG tablet Commonly known as:  ANTIVERT Take 1 tablet (25 mg total) by mouth 3 (three) times daily as needed for dizziness.   oxyCODONE-acetaminophen 10-325 MG tablet Commonly known as:  PERCOCET Take 1 tablet by mouth every 4 (four) hours as needed for pain.   pantoprazole 40 MG tablet Commonly known as:  PROTONIX Take 40 mg by mouth daily.   pregabalin 75 MG capsule Commonly known as:  LYRICA Take 75 mg by mouth 3 (three) times daily.   promethazine 25 MG tablet Commonly known as:  PHENERGAN Take 1 tablet (25 mg total) by mouth every 8 (eight) hours as needed for nausea or vomiting.   rizatriptan 5 MG disintegrating tablet Commonly known as:  MAXALT-MLT Take 5 mg by mouth daily as needed for migraine.  May repeat in 2 hours if needed   tiZANidine 4 MG tablet Commonly known as:  ZANAFLEX Take 4 mg by mouth every 6 (six) hours as needed for muscle spasms.        SignedTressie Stalker D 11/20/2015, 8:04 AM

## 2015-11-20 NOTE — Care Management Note (Signed)
Case Management Note  Patient Details  Name: Sandra [name] MRN: [id number] Date of Birth: 1975-06-21  Subjective/Objective:   Pt admitted on 11/19/15 s/p VP shunt placement.  PTA, pt independent of ADLS.                 Action/Plan: Will follow for discharge planning as pt progresses.    Expected Discharge Date:                  Expected Discharge Plan:  Home/Self Care  In-House Referral:     Discharge planning Services  CM Consult  Post Acute Care Choice:    Choice offered to:     DME Arranged:    DME Agency:     HH Arranged:    HH Agency:     Status of Service:  In process, will continue to follow  If discussed at Long Length of Stay Meetings, dates discussed:    Additional Comments:  Quintella BatonJulie W. Briar Sword, RN, BSN  Trauma/Neuro ICU Case Manager [phone]

## 2017-07-07 ENCOUNTER — Other Ambulatory Visit: Payer: Self-pay

## 2017-07-07 ENCOUNTER — Encounter (HOSPITAL_COMMUNITY): Payer: Self-pay | Admitting: *Deleted

## 2017-07-07 ENCOUNTER — Emergency Department (HOSPITAL_COMMUNITY)
Admission: EM | Admit: 2017-07-07 | Discharge: 2017-07-07 | Disposition: A | Discharge status: Home / Self Care | Payer: Medicaid Other | Attending: Emergency Medicine | Admitting: Emergency Medicine

## 2017-07-07 ENCOUNTER — Emergency Department (HOSPITAL_COMMUNITY): Payer: Medicaid Other

## 2017-07-07 DIAGNOSIS — Z982 Presence of cerebrospinal fluid drainage device: Secondary | ICD-10-CM | POA: Insufficient documentation

## 2017-07-07 DIAGNOSIS — Z91040 Latex allergy status: Secondary | ICD-10-CM | POA: Insufficient documentation

## 2017-07-07 DIAGNOSIS — R51 Headache: Secondary | ICD-10-CM | POA: Diagnosis not present

## 2017-07-07 DIAGNOSIS — Z79899 Other long term (current) drug therapy: Secondary | ICD-10-CM | POA: Insufficient documentation

## 2017-07-07 DIAGNOSIS — I10 Essential (primary) hypertension: Secondary | ICD-10-CM | POA: Diagnosis not present

## 2017-07-07 DIAGNOSIS — R1031 Right lower quadrant pain: Secondary | ICD-10-CM | POA: Diagnosis not present

## 2017-07-07 DIAGNOSIS — R1030 Lower abdominal pain, unspecified: Secondary | ICD-10-CM

## 2017-07-07 LAB — CBC
HEMATOCRIT: 38.9 % (ref 36.0–46.0)
HEMOGLOBIN: 12.9 g/dL (ref 12.0–15.0)
MCH: 29.5 pg (ref 26.0–34.0)
MCHC: 33.2 g/dL (ref 30.0–36.0)
MCV: 88.8 fL (ref 78.0–100.0)
PLATELETS: 193 10*3/uL (ref 150–400)
RBC: 4.38 MIL/uL (ref 3.87–5.11)
RDW: 12.9 % (ref 11.5–15.5)
WBC: 12.6 10*3/uL — ABNORMAL HIGH (ref 4.0–10.5)

## 2017-07-07 LAB — COMPREHENSIVE METABOLIC PANEL
ALBUMIN: 3.7 g/dL (ref 3.5–5.0)
ALT: 17 U/L (ref 14–54)
ANION GAP: 9 (ref 5–15)
AST: 20 U/L (ref 15–41)
Alkaline Phosphatase: 88 U/L (ref 38–126)
BUN: 13 mg/dL (ref 6–20)
CO2: 23 mmol/L (ref 22–32)
Calcium: 8.9 mg/dL (ref 8.9–10.3)
Chloride: 104 mmol/L (ref 101–111)
Creatinine, Ser: 1.1 mg/dL — ABNORMAL HIGH (ref 0.44–1.00)
GFR calc Af Amer: >60 mL/min (ref >60)
GFR calc non Af Amer: >60 mL/min (ref >60)
GLUCOSE: 126 mg/dL — AB (ref 65–99)
POTASSIUM: 3.9 mmol/L (ref 3.5–5.1)
SODIUM: 136 mmol/L (ref 135–145)
Total Bilirubin: 1.3 mg/dL — ABNORMAL HIGH (ref 0.3–1.2)
Total Protein: 7.8 g/dL (ref 6.5–8.1)

## 2017-07-07 LAB — URINALYSIS, ROUTINE W REFLEX MICROSCOPIC
BILIRUBIN URINE: NEGATIVE
Glucose, UA: NEGATIVE mg/dL
HGB URINE DIPSTICK: NEGATIVE
Ketones, ur: NEGATIVE mg/dL
LEUKOCYTES UA: NEGATIVE
NITRITE: NEGATIVE
PH: 7 (ref 5.0–8.0)
Protein, ur: 30 mg/dL — AB
SPECIFIC GRAVITY, URINE: 1.024 (ref 1.005–1.030)

## 2017-07-07 LAB — LIPASE, BLOOD: Lipase: 20 U/L (ref 11–51)

## 2017-07-07 MED ORDER — TRAMADOL HCL 50 MG PO TABS: 50.0000 mg | ORAL_TABLET | Freq: Four times a day (QID) | ORAL | 0 refills | Status: DC | PRN

## 2017-07-07 MED ORDER — IOPAMIDOL (ISOVUE-300) INJECTION 61%
100.0000 mL | Freq: Once | INTRAVENOUS | Status: AC | PRN
  Administered 2017-07-07: 100 mL via INTRAVENOUS

## 2017-07-07 MED ORDER — ONDANSETRON HCL 4 MG/2ML IJ SOLN
4.0000 mg | Freq: Once | INTRAMUSCULAR | Status: AC
  Administered 2017-07-07: 4 mg via INTRAVENOUS
  Filled 2017-07-07: qty 2

## 2017-07-07 MED ORDER — SODIUM CHLORIDE 0.9 % IV BOLUS
1000.0000 mL | Freq: Once | INTRAVENOUS | Status: AC
  Administered 2017-07-07: 1000 mL via INTRAVENOUS

## 2017-07-07 MED ORDER — HYDROMORPHONE HCL 1 MG/ML IJ SOLN
0.5000 mg | Freq: Once | INTRAMUSCULAR | Status: AC
  Administered 2017-07-07: 0.5 mg via INTRAVENOUS
  Filled 2017-07-07: qty 1

## 2017-07-07 NOTE — ED Triage Notes (Signed)
Pt c/o abdominal pain that stated yesterday; pt has had some nausea with the pain; pt has a brain shunt that empties into her abdomen; pt states she has had some swelling and pain around her shunt area to the left side of her head; pt called her doctor at [facility] yesterday but they did not return her phone call

## 2017-07-07 NOTE — ED Provider Notes (Signed)
[facility] EMERGENCY DEPARTMENT Provider Note   CSN: [id number] Arrival date & time: 07/07/17  4098     History   Chief Complaint Chief Complaint  Patient presents with  . Abdominal Pain    HPI Sandra [name] is a 42 y.o. female.  Patient complains of lower abdominal discomfort.  She also complains of some discomfort behind her left ear she has a ventricular shunt.  The pain is worse with movement and coughing  The history is provided by the patient. No language interpreter was used.  Abdominal Pain   This is a new problem. The current episode started 2 days ago. The problem occurs constantly. The problem has not changed since onset.The pain is associated with an unknown factor. The pain is located in the generalized abdominal region. The quality of the pain is aching. The pain is at a severity of 4/10. The pain is moderate. Pertinent negatives include diarrhea, flatus, frequency, hematuria and headaches.    Past Medical History:  Diagnosis Date  . Allergy    takes Claritin daily  . Anxiety   . Arthritis   . Chronic back pain    buldging/herniated disc,stenosis,mild scoliosis/arthritis  . Complication of anesthesia    itching  . Constipation    takes Linzess as needed  . Depression    takes Fluoxetine daily  . Dyspnea   . Fibromyalgia    takes Lyrica daily  . GERD (gastroesophageal reflux disease)    with esophageal stricture.Takes Protonix daily  . History of bronchitis    1-2 yrs ago  . History of gastric ulcer   . History of hiatal hernia    stomach ulcer ==  had one at age 55  . Hypertension    not on any meds  . Joint pain   . Joint swelling   . Migraines    takes Maxalt as needed.Last migraine a wk ago  . Muscle spasm    takes Zanaflex as needed  . Nocturia   . Pneumonia    hx of  . PONV (postoperative nausea and vomiting)    usually phenergan helps  . POTS (postural orthostatic tachycardia syndrome)    possible, episodes of sinus tachycardis,  holter in 2007 with sinus tachycardia with rates as high as 160  . S/P Botox injection    for migraines  . Spinal headache 2016   blood patch   . Vertigo    takes Anitvert as needed  . Weakness    numbness and tingling in left leg    Patient Active Problem List   Diagnosis Date Noted  . Shunt malfunction 11/19/2015  . Acute renal failure (HCC)   . Headache, migraine   . POTS (postural orthostatic tachycardia syndrome) 04/04/2015  . Hypotension 04/04/2015  . SIRS (systemic inflammatory response syndrome) (HCC) 04/04/2015  . Pyrexia   . Pseudotumor cerebri 02/03/2014  . Possible Bacterial meningitis 02/03/2014  . Lumbar herniated disc 01/11/2014  . Morbid obesity (HCC) 01/21/2010  . INTERMEDIATE CORONARY SYNDROME 01/21/2010  . Headache, spinal, postoperative 01/21/2010  . UNSPECIFIED TACHYCARDIA 01/21/2010  . PALPITATIONS 01/21/2010  . PRECORDIAL PAIN 01/21/2010  . GERD 01/16/2010  . NAUSEA WITH VOMITING 01/16/2010  . DYSPHAGIA UNSPECIFIED 01/16/2010  . ABDOMINAL PAIN, EPIGASTRIC 01/16/2010    Past Surgical History:  Procedure Laterality Date  . ABDOMINAL HYSTERECTOMY    . BACK SURGERY    . BLADDER SURGERY     bladder tact  . BREAST SURGERY Left    Lumpectomy  . CARDIAC  CATHETERIZATION  2010  . CHOLECYSTECTOMY    . COLONOSCOPY    . CYSTOSCOPY W/ URETERAL STENT REMOVAL      stent placed and removed  . ESOPHAGEAL DILATION     4-5 times  . fluid removal     from brain 4-5 times.  last time 8-9 mths ago @ [facility] imaging  . LUMBAR LAMINECTOMY/DECOMPRESSION MICRODISCECTOMY Left 01/11/2014   Procedure: LUMBAR LAMINECTOMY/DECOMPRESSION MICRODISCECTOMY 1 LEVEL LEFT LUMBAR TWO/THREE;  Surgeon: Tressie Stalker, MD;  Location: [facility]UR[facility];  Service: Neurosurgery;  Laterality: Left;  . TUBAL LIGATION    . VENTRICULOPERITONEAL SHUNT Right 11/19/2015   Procedure: VENTRICULAR-PERITONEAL SHUNT RIGHT;  Surgeon: Tressie Stalker, MD;  Location: [facility]UR[facility];  Service:  Neurosurgery;  Laterality: Right;  Right     OB History   None      Home Medications    Prior to Admission medications   Medication Sig Start Date End Date Taking? Authorizing Provider  amitriptyline (ELAVIL) 10 MG tablet Take 10 mg by mouth. 05/14/17 05/14/18 Yes [provider]  butalbital-acetaminophen-caffeine (FIORICET, ESGIC) 50-325-40 MG tablet Take 1 tablet by mouth every 6 (six) hours as needed for headache. 04/06/15  Yes Vassie Loll, MD  cetirizine (ZYRTEC) 10 MG tablet Take 10 mg by mouth.   Yes [provider]  diphenhydrAMINE (BENADRYL) 25 mg capsule Take 25 mg by mouth every 6 (six) hours as needed for itching.   Yes [provider]  docusate sodium (COLACE) 100 MG capsule Take 1 capsule (100 mg total) by mouth 2 (two) times daily. 11/20/15  Yes Tressie Stalker, MD  FLUoxetine (PROZAC) 40 MG capsule Take 40 mg by mouth daily.   Yes [provider]  hydrochlorothiazide (HYDRODIURIL) 12.5 MG tablet Take 12.5 mg by mouth daily.  06/08/17  Yes [provider]  ibuprofen (ADVIL,MOTRIN) 800 MG tablet TAKE ONE TABLET BY MOUTH EVERY 8 HOURS AS NEEDED 04/24/14  Yes Huston Foley, MD  Linaclotide (LINZESS) 145 MCG CAPS capsule Take 145 mcg by mouth daily as needed (for constipation).   Yes [provider]  meclizine (ANTIVERT) 25 MG tablet Take 1 tablet (25 mg total) by mouth 3 (three) times daily as needed for dizziness. 08/11/14  Yes Anson Fret, MD  pantoprazole (PROTONIX) 40 MG tablet Take 40 mg by mouth daily.  08/13/12  Yes [provider]  pregabalin (LYRICA) 300 MG capsule Take 300 mg by mouth at bedtime.    Yes [provider]  promethazine (PHENERGAN) 25 MG tablet Take 1 tablet (25 mg total) by mouth every 8 (eight) hours as needed for nausea or vomiting. 09/04/14  Yes Anson Fret, MD  rizatriptan (MAXALT-MLT) 5 MG disintegrating tablet Take 5 mg by mouth daily as needed for migraine. May repeat in 2  hours if needed   Yes [provider]  senna-docusate (SENOKOT-S) 8.6-50 MG tablet Take 1 tablet by mouth as needed.  01/17/16  Yes [provider]  tetrahydrozoline (VISINE) 0.05 % ophthalmic solution Place 1 drop into both eyes as needed (For allergies).   Yes [provider]  tiZANidine (ZANAFLEX) 4 MG tablet Take 4 mg by mouth at bedtime.    Yes [provider]  acetaZOLAMIDE (DIAMOX) 500 MG capsule Take 2 capsules (1,000 mg total) by mouth 2 (two) times daily. 09/04/14   Anson Fret, MD  FLECTOR 1.3 % PTCH Place 1 patch onto the skin daily as needed. pain 11/14/13   [provider]  loratadine (CLARITIN) 10  MG tablet Take 10 mg by mouth daily.    [provider]  oxyCODONE-acetaminophen (PERCOCET) 10-325 MG tablet Take 1 tablet by mouth every 4 (four) hours as needed for pain. 11/20/15   Tressie Stalker, MD  traMADol (ULTRAM) 50 MG tablet Take 1 tablet (50 mg total) by mouth every 6 (six) hours as needed. 07/07/17   Bethann Berkshire, MD    Family History Family History  Problem Relation Age of Onset  . Kidney cancer Mother   . Diabetes Mother   . Ulcers Father   . Coronary artery disease Father        pci at age 74  . Prostate cancer Maternal Grandfather   . Pancreatic cancer Maternal Grandmother   . Lung cancer Paternal Grandmother        metastatic  . Aortic aneurysm Unknown     Social History Social History   Tobacco Use  . Smoking status: Never Smoker  . Smokeless tobacco: Never Used  Substance Use Topics  . Alcohol use: No    Alcohol/week: 0.0 oz  . Drug use: No     Allergies   Ambien [zolpidem tartrate]; Penicillins; Topamax [topiramate]; Chlorhexidine gluconate; Hydrocodone-acetaminophen; Latex; and Versed [midazolam]   Review of Systems Review of Systems  Constitutional: Negative for appetite change and fatigue.  HENT: Negative for congestion, ear discharge and sinus pressure.   Eyes: Negative for  discharge.  Respiratory: Negative for cough.   Cardiovascular: Negative for chest pain.  Gastrointestinal: Positive for abdominal pain. Negative for diarrhea and flatus.  Genitourinary: Negative for frequency and hematuria.  Musculoskeletal: Negative for back pain.  Skin: Negative for rash.  Neurological: Negative for seizures and headaches.  Psychiatric/Behavioral: Negative for hallucinations.     Physical Exam Updated Vital Signs BP 106/69 (BP Location: Left Arm)   Pulse 99   Temp 98.9 F (37.2 C) (Oral)   Resp 18   Ht  (1.676 m)   Wt 117.9 kg (260 lb)   SpO2 97%   BMI 41.97 kg/m   Physical Exam  Constitutional: She is oriented to person, place, and time. She appears well-developed.  HENT:  Head: Normocephalic.  Eyes: Conjunctivae and EOM are normal. No scleral icterus.  Neck: Neck supple. No thyromegaly present.  Cardiovascular: Normal rate and regular rhythm. Exam reveals no gallop and no friction rub.  No murmur heard. Pulmonary/Chest: No stridor. She has no wheezes. She has no rales. She exhibits no tenderness.  Abdominal: She exhibits no distension. There is tenderness. There is no rebound.  Tender to right lower quadrant and suprapubic  Musculoskeletal: Normal range of motion. She exhibits no edema.  Lymphadenopathy:    She has no cervical adenopathy.  Neurological: She is oriented to person, place, and time. She exhibits normal muscle tone. Coordination normal.  Skin: No rash noted. No erythema.  Psychiatric: She has a normal mood and affect. Her behavior is normal.     ED Treatments / Results  Labs (all labs ordered are listed, but only abnormal results are displayed) Labs Reviewed  COMPREHENSIVE METABOLIC PANEL - Abnormal; Notable for the following components:      Result Value   Glucose, Bld 126 (*)    Creatinine, Ser 1.10 (*)    Total Bilirubin 1.3 (*)    All other components within normal limits  CBC - Abnormal; Notable for the following  components:   WBC 12.6 (*)    All other components within normal limits  URINALYSIS, ROUTINE W REFLEX MICROSCOPIC -  Abnormal; Notable for the following components:   APPearance HAZY (*)    Protein, ur 30 (*)    Bacteria, UA RARE (*)    All other components within normal limits  LIPASE, BLOOD    EKG None  Radiology Ct Abdomen Pelvis W Contrast  Result Date: 07/07/2017 CLINICAL DATA:  Periumbilical pain with nausea and abdominal distension EXAM: CT ABDOMEN AND PELVIS WITH CONTRAST TECHNIQUE: Multidetector CT imaging of the abdomen and pelvis was performed using the standard protocol following bolus administration of intravenous contrast. CONTRAST:  ISOVUE-300 IOPAMIDOL (ISOVUE-300) INJECTION 61% COMPARISON:  MR abdomen April 15, 2016 FINDINGS: Lower chest: Lung bases are clear. Hepatobiliary: No focal liver lesions are appreciable. Gallbladder is absent. There is no biliary duct dilatation. Pancreas: No pancreatic mass or inflammatory focus. Spleen: No splenic lesions are appreciable. Adrenals/Urinary Tract: Adrenals bilaterally appear unremarkable. There is a 1.0 x 0.9 cm cyst arising from the posterior mid left kidney. There is no hydronephrosis on either side. There is a 2 mm nonobstructing calculus in the lower pole of the right kidney. There is no evident ureteral calculus on either side. Urinary bladder is midline with wall thickness within normal limits. Stomach/Bowel: There is no appreciable bowel wall or mesenteric thickening. No evident bowel obstruction. No free air or portal venous air. Vascular/Lymphatic: There is no abdominal aortic aneurysm. No vascular lesions are appreciable. There is no adenopathy in the abdomen or pelvis. Reproductive: Uterus is absent. Tubal ligation clips noted bilaterally. Other: Appendix appears normal. There is a ventriculoperitoneal shunt catheter which enters the upper abdomen in the midline. The catheter extends to the right laterally than turns  anteriorly to the left with the tip of the catheter in the anterior left mid abdomen at the level of L4-5. A small amount of nearby loculated fluid in the mid abdomen anteriorly that is likely secondary to the shunt catheter. There is no evident abscess in the abdomen pelvis. There is no free fluid evident in the abdomen or pelvis. Musculoskeletal: No evident blastic or lytic bone lesions. No intramuscular or abdominal wall lesion evident. IMPRESSION: 1. Ventriculoperitoneal shunt catheter tip in anterior left mid abdomen with nearby mild amount of loculated fluid, likely a consequence of drainage from the ventricles. No abscess evident in the abdomen or pelvis. 2.  No bowel obstruction.  No free air.  Appendix appears normal. 3. Nonobstructing 2 mm calculus lower pole right kidney. No hydronephrosis on either side. 4.  Gallbladder absent.  Uterus absent. Electronically Signed   By: Bretta Bang III M.D.   On: 07/07/2017 08:50    Procedures Procedures (including critical care time)  Medications Ordered in ED Medications  sodium chloride 0.9 % bolus 1,000 mL (0 mLs Intravenous Stopped 07/07/17 0837)  HYDROmorphone (DILAUDID) injection 0.5 mg (0.5 mg Intravenous Given 07/07/17 0723)  ondansetron (ZOFRAN) injection 4 mg (4 mg Intravenous Given 07/07/17 0723)  iopamidol (ISOVUE-300) 61 % injection 100 mL (100 mLs Intravenous Contrast Given 07/07/17 0807)     Initial Impression / Assessment and Plan / ED Course  I have reviewed the triage vital signs and the nursing notes.  Pertinent labs & imaging results that were available during my care of the patient were reviewed by me and considered in my medical decision making (see chart for details).     Chemistries unremarkable.  Patient has mildly elevated white count.  CT scan shows abdomen normal with what appears to be functioning ventricular shunt.  Suspect discomfort is related to her abdominal  wall.  Patient is given some pain medicine and is  referred to GI and told to go back to her doctor in the position on 10 for evaluation  Final Clinical Impressions(s) / ED Diagnoses   Final diagnoses:  Lower abdominal pain    ED Discharge Orders        Ordered    traMADol (ULTRAM) 50 MG tablet  Every 6 hours PRN     07/07/17 1610       Bethann Berkshire, MD 07/07/17 [phone]

## 2017-07-07 NOTE — Discharge Instructions (Signed)
Follow-up with Dr. Darrick Penna the stomach doctor if not improving.  Follow-up with your doctor for your shunt also

## 2017-07-07 NOTE — ED Notes (Signed)
Patient transported to CT 

## 2017-07-15 ENCOUNTER — Emergency Department (HOSPITAL_COMMUNITY): Payer: Medicaid Other

## 2017-07-15 ENCOUNTER — Encounter: Payer: Self-pay | Admitting: Gastroenterology

## 2017-07-15 ENCOUNTER — Encounter (HOSPITAL_COMMUNITY): Payer: Self-pay | Admitting: Emergency Medicine

## 2017-07-15 ENCOUNTER — Emergency Department (HOSPITAL_COMMUNITY)
Admission: EM | Admit: 2017-07-15 | Discharge: 2017-07-16 | Disposition: A | Discharge status: Other Acute Inpatient Hospital | Payer: Medicaid Other | Attending: Emergency Medicine | Admitting: Emergency Medicine

## 2017-07-15 ENCOUNTER — Other Ambulatory Visit: Payer: Self-pay

## 2017-07-15 ENCOUNTER — Ambulatory Visit: Payer: Medicaid Other | Admitting: Gastroenterology

## 2017-07-15 DIAGNOSIS — A419 Sepsis, unspecified organism: Secondary | ICD-10-CM | POA: Insufficient documentation

## 2017-07-15 DIAGNOSIS — R509 Fever, unspecified: Secondary | ICD-10-CM | POA: Diagnosis not present

## 2017-07-15 DIAGNOSIS — Z79899 Other long term (current) drug therapy: Secondary | ICD-10-CM | POA: Diagnosis not present

## 2017-07-15 DIAGNOSIS — R1084 Generalized abdominal pain: Secondary | ICD-10-CM | POA: Insufficient documentation

## 2017-07-15 DIAGNOSIS — Z91040 Latex allergy status: Secondary | ICD-10-CM | POA: Insufficient documentation

## 2017-07-15 DIAGNOSIS — R42 Dizziness and giddiness: Secondary | ICD-10-CM | POA: Insufficient documentation

## 2017-07-15 DIAGNOSIS — I10 Essential (primary) hypertension: Secondary | ICD-10-CM | POA: Insufficient documentation

## 2017-07-15 DIAGNOSIS — R109 Unspecified abdominal pain: Secondary | ICD-10-CM | POA: Insufficient documentation

## 2017-07-15 DIAGNOSIS — Z982 Presence of cerebrospinal fluid drainage device: Secondary | ICD-10-CM | POA: Diagnosis not present

## 2017-07-15 LAB — CBC WITH DIFFERENTIAL/PLATELET
BASOS PCT: 0 %
Basophils Absolute: 0.1 10*3/uL (ref 0.0–0.1)
Eosinophils Absolute: 0.1 10*3/uL (ref 0.0–0.7)
Eosinophils Relative: 1 %
HCT: 38.6 % (ref 36.0–46.0)
HEMOGLOBIN: 12.6 g/dL (ref 12.0–15.0)
LYMPHS PCT: 9 %
Lymphs Abs: 1.6 10*3/uL (ref 0.7–4.0)
MCH: 29.3 pg (ref 26.0–34.0)
MCHC: 32.6 g/dL (ref 30.0–36.0)
MCV: 89.8 fL (ref 78.0–100.0)
MONOS PCT: 6 %
Monocytes Absolute: 1 10*3/uL (ref 0.1–1.0)
NEUTROS ABS: 13.7 10*3/uL (ref 1.7–7.7)
NEUTROS PCT: 84 %
PLATELETS: 371 10*3/uL (ref 150–400)
RBC: 4.3 MIL/uL (ref 3.87–5.11)
RDW: 13.3 % (ref 11.5–15.5)
WBC: 16.4 10*3/uL — ABNORMAL HIGH (ref 4.0–10.5)

## 2017-07-15 LAB — URINALYSIS, ROUTINE W REFLEX MICROSCOPIC
BILIRUBIN URINE: NEGATIVE
Glucose, UA: NEGATIVE mg/dL
KETONES UR: NEGATIVE mg/dL
Leukocytes, UA: NEGATIVE
Nitrite: NEGATIVE
Protein, ur: 30 mg/dL — AB
SPECIFIC GRAVITY, URINE: 1.021 (ref 1.005–1.030)
pH: 5 (ref 5.0–8.0)

## 2017-07-15 LAB — COMPREHENSIVE METABOLIC PANEL
ALBUMIN: 2.9 g/dL — AB (ref 3.5–5.0)
ALK PHOS: 113 U/L (ref 38–126)
ALT: 30 U/L (ref 14–54)
ANION GAP: 12 (ref 5–15)
AST: 32 U/L (ref 15–41)
BUN: 12 mg/dL (ref 6–20)
CO2: 30 mmol/L (ref 22–32)
Calcium: 8.7 mg/dL — ABNORMAL LOW (ref 8.9–10.3)
Chloride: 93 mmol/L — ABNORMAL LOW (ref 101–111)
Creatinine, Ser: 0.98 mg/dL (ref 0.44–1.00)
GFR calc Af Amer: >60 mL/min (ref >60)
GFR calc non Af Amer: >60 mL/min (ref >60)
GLUCOSE: 128 mg/dL — AB (ref 65–99)
POTASSIUM: 3.1 mmol/L — AB (ref 3.5–5.1)
SODIUM: 135 mmol/L (ref 135–145)
TOTAL PROTEIN: 7.6 g/dL (ref 6.5–8.1)
Total Bilirubin: 0.8 mg/dL (ref 0.3–1.2)

## 2017-07-15 LAB — I-STAT CG4 LACTIC ACID, ED
LACTIC ACID, VENOUS: 2.46 mmol/L — AB (ref 0.5–1.9)
LACTIC ACID, VENOUS: 0.94 mmol/L (ref 0.5–1.9)

## 2017-07-15 LAB — LIPASE, BLOOD: Lipase: 53 U/L — ABNORMAL HIGH (ref 11–51)

## 2017-07-15 MED ORDER — SODIUM CHLORIDE 0.9 % IV BOLUS (SEPSIS): 1000.0000 mL | Freq: Once | INTRAVENOUS | Status: DC

## 2017-07-15 MED ORDER — ONDANSETRON HCL 4 MG/2ML IJ SOLN
4.0000 mg | Freq: Once | INTRAMUSCULAR | Status: AC
  Administered 2017-07-15: 4 mg via INTRAVENOUS
  Filled 2017-07-15: qty 2

## 2017-07-15 MED ORDER — SODIUM CHLORIDE 0.9 % IV BOLUS
1000.0000 mL | Freq: Once | INTRAVENOUS | Status: AC
  Administered 2017-07-15: 1000 mL via INTRAVENOUS

## 2017-07-15 MED ORDER — HYDROMORPHONE HCL 1 MG/ML IJ SOLN
1.0000 mg | Freq: Once | INTRAMUSCULAR | Status: AC
  Administered 2017-07-15: 1 mg via INTRAVENOUS
  Filled 2017-07-15: qty 1

## 2017-07-15 MED ORDER — ACETAMINOPHEN 325 MG PO TABS
650.0000 mg | ORAL_TABLET | Freq: Once | ORAL | Status: AC
  Administered 2017-07-15: 650 mg via ORAL
  Filled 2017-07-15: qty 2

## 2017-07-15 MED ORDER — DEXTROSE-NACL 5-0.45 % IV SOLN
INTRAVENOUS | Status: DC
  Administered 2017-07-15 – 2017-07-16 (×2): via INTRAVENOUS

## 2017-07-15 MED ORDER — ACYCLOVIR 5 % EX CREA
1.0000 | TOPICAL_CREAM | CUTANEOUS | Status: DC
Start: 2017-07-15 — End: 2017-07-16
  Administered 2017-07-15 – 2017-07-16 (×6): 1 via TOPICAL
  Filled 2017-07-15 (×2): qty 5

## 2017-07-15 MED ORDER — LEVOFLOXACIN IN D5W 500 MG/100ML IV SOLN: 500.0000 mg | INTRAVENOUS | Status: DC

## 2017-07-15 MED ORDER — ONDANSETRON HCL 4 MG/2ML IJ SOLN
4.0000 mg | Freq: Three times a day (TID) | INTRAMUSCULAR | Status: DC | PRN
  Administered 2017-07-16 (×2): 4 mg via INTRAVENOUS
  Filled 2017-07-15 (×2): qty 2

## 2017-07-15 MED ORDER — POTASSIUM CHLORIDE 10 MEQ/100ML IV SOLN
10.0000 meq | INTRAVENOUS | Status: AC
  Administered 2017-07-15 (×3): 10 meq via INTRAVENOUS
  Filled 2017-07-15 (×3): qty 100

## 2017-07-15 MED ORDER — METRONIDAZOLE IN NACL 5-0.79 MG/ML-% IV SOLN
500.0000 mg | Freq: Three times a day (TID) | INTRAVENOUS | Status: DC
  Administered 2017-07-15 – 2017-07-16 (×2): 500 mg via INTRAVENOUS
  Filled 2017-07-15 (×3): qty 100

## 2017-07-15 MED ORDER — HYDROMORPHONE HCL 1 MG/ML IJ SOLN
0.5000 mg | INTRAMUSCULAR | Status: DC | PRN
Start: 2017-07-15 — End: 2017-07-16
  Administered 2017-07-15 – 2017-07-16 (×5): 0.5 mg via INTRAVENOUS
  Filled 2017-07-15 (×5): qty 1

## 2017-07-15 MED ORDER — SODIUM CHLORIDE 0.9 % IV BOLUS (SEPSIS)
1000.0000 mL | Freq: Once | INTRAVENOUS | Status: AC
  Administered 2017-07-15: 1000 mL via INTRAVENOUS

## 2017-07-15 MED ORDER — IOPAMIDOL (ISOVUE-300) INJECTION 61%
100.0000 mL | Freq: Once | INTRAVENOUS | Status: AC | PRN
  Administered 2017-07-15: 100 mL via INTRAVENOUS

## 2017-07-15 MED ORDER — ACETAMINOPHEN 325 MG PO TABS: 650.0000 mg | ORAL_TABLET | Freq: Four times a day (QID) | ORAL | Status: DC | PRN

## 2017-07-15 MED ORDER — LEVOFLOXACIN IN D5W 750 MG/150ML IV SOLN
750.0000 mg | Freq: Once | INTRAVENOUS | Status: AC
  Administered 2017-07-15: 750 mg via INTRAVENOUS
  Filled 2017-07-15: qty 150

## 2017-07-15 MED ORDER — METRONIDAZOLE IN NACL 5-0.79 MG/ML-% IV SOLN
500.0000 mg | Freq: Once | INTRAVENOUS | Status: AC
  Administered 2017-07-15: 500 mg via INTRAVENOUS
  Filled 2017-07-15: qty 100

## 2017-07-15 NOTE — ED Notes (Signed)
CRITICAL VALUE ALERT  Critical Value:  Lactic acid 2.46  Date & Time Notied:  07/15/17 1627  Provider Notified: Charm Barges MD  Orders Received/Actions taken: none

## 2017-07-15 NOTE — ED Provider Notes (Signed)
[facility] EMERGENCY DEPARTMENT Provider Note   CSN: [id number] Arrival date & time: 07/15/17  1531     History   Chief Complaint Chief Complaint  Patient presents with  . Abdominal Pain    HPI Sandra [name] is a 42 y.o. female.  She presents today with continued fever vomiting diarrhea and abdominal pain for at least 10 days.  She was seen here on the 14th where she had lab work and an abdominal CT with no obvious diagnosis.  She seen her PCP since then and was at the GI clinic today.  Concern from GI was continued abdominal process and fever that required further work-up in the emergency department.  She states she has had some fever blisters on her face.  She continues to take Tylenol and ibuprofen and drink Gatorade and use Phenergan suppositories.  She was to see her neurosurgeon Dr. Lionel December at [facility] but was unable to go due to her illness.  She has had a VP shunt with multiple revisions in the past.  She says her shunt revisions are precipitated by headaches and she says her symptoms over the past 10 days are unlike that.  She did have some erythema over her shunt on her initial presentation 1 week ago but that is resolved.  The history is provided by the patient.  Abdominal Pain   This is a new problem. The current episode started more than 1 week ago. The problem occurs constantly. The problem has been gradually worsening. The pain is associated with an unknown factor. The pain is located in the generalized abdominal region. The quality of the pain is sharp. The pain is severe. Associated symptoms include fever, diarrhea, nausea and vomiting. Pertinent negatives include hematochezia, melena, constipation, dysuria, frequency, hematuria, headaches, arthralgias and myalgias. Nothing aggravates the symptoms. Nothing relieves the symptoms. Past workup includes GI consult and CT scan.    Past Medical History:  Diagnosis Date  . Allergy    takes Claritin daily  . Anxiety   .  Arthritis   . Chronic back pain    buldging/herniated disc,stenosis,mild scoliosis/arthritis  . Complication of anesthesia    itching  . Constipation    takes Linzess as needed  . Depression    takes Fluoxetine daily  . Dyspnea   . Fibromyalgia    takes Lyrica daily  . GERD (gastroesophageal reflux disease)    with esophageal stricture.Takes Protonix daily  . History of bronchitis    1-2 yrs ago  . History of gastric ulcer   . History of hiatal hernia    stomach ulcer ==  had one at age 50  . Hypertension    not on any meds  . Joint pain   . Joint swelling   . Migraines    takes Maxalt as needed.Last migraine a wk ago  . Muscle spasm    takes Zanaflex as needed  . Nocturia   . Pneumonia    hx of  . PONV (postoperative nausea and vomiting)    usually phenergan helps  . POTS (postural orthostatic tachycardia syndrome)    possible, episodes of sinus tachycardis, holter in 2007 with sinus tachycardia with rates as high as 160  . S/P Botox injection    for migraines  . Spinal headache 2016   blood patch   . Vertigo    takes Anitvert as needed  . Weakness    numbness and tingling in left leg    Patient Active Problem List   Diagnosis  Date Noted  . Unspecified abdominal pain 07/15/2017  . Fever, unspecified 07/15/2017  . Shunt malfunction 11/19/2015  . Acute renal failure (HCC)   . Headache, migraine   . POTS (postural orthostatic tachycardia syndrome) 04/04/2015  . Hypotension 04/04/2015  . SIRS (systemic inflammatory response syndrome) (HCC) 04/04/2015  . Pyrexia   . Pseudotumor cerebri 02/03/2014  . Possible Bacterial meningitis 02/03/2014  . Lumbar herniated disc 01/11/2014  . Morbid obesity (HCC) 01/21/2010  . INTERMEDIATE CORONARY SYNDROME 01/21/2010  . Headache, spinal, postoperative 01/21/2010  . UNSPECIFIED TACHYCARDIA 01/21/2010  . PALPITATIONS 01/21/2010  . PRECORDIAL PAIN 01/21/2010  . GERD 01/16/2010  . NAUSEA WITH VOMITING 01/16/2010  .  DYSPHAGIA UNSPECIFIED 01/16/2010  . ABDOMINAL PAIN, EPIGASTRIC 01/16/2010    Past Surgical History:  Procedure Laterality Date  . ABDOMINAL HYSTERECTOMY    . BACK SURGERY    . BLADDER SURGERY     bladder tact  . BREAST SURGERY Left    Lumpectomy  . CARDIAC CATHETERIZATION  2010  . CHOLECYSTECTOMY    . COLONOSCOPY    . CYSTOSCOPY W/ URETERAL STENT REMOVAL      stent placed and removed  . ESOPHAGEAL DILATION     4-5 times  . fluid removal     from brain 4-5 times.  last time 8-9 mths ago @ [facility]  . LUMBAR LAMINECTOMY/DECOMPRESSION MICRODISCECTOMY Left 01/11/2014   Procedure: LUMBAR LAMINECTOMY/DECOMPRESSION MICRODISCECTOMY 1 LEVEL LEFT LUMBAR TWO/THREE;  Surgeon: Tressie Stalker, MD;  Location: [facility]UR[facility];  Service: Neurosurgery;  Laterality: Left;  . TUBAL LIGATION    . VENTRICULOPERITONEAL SHUNT Right 11/19/2015   Procedure: VENTRICULAR-PERITONEAL SHUNT RIGHT;  Surgeon: Tressie Stalker, MD;  Location: [facility] NEUR[facility] OR[facility];  Service: Neurosurgery;  Laterality: Right;  Right     OB History   None      Home Medications    Prior to Admission medications   Medication Sig Start Date End Date Taking? Authorizing Provider  acyclovir cream (ZOVIRAX) 5 % Apply 1 application topically every 3 (three) hours.    [provider]  butalbital-acetaminophen-caffeine (FIORICET, ESGIC) [phone] MG tablet Take 1 tablet by mouth every 6 (six) hours as needed for headache. 04/06/15   Vassie Loll, MD  cetirizine (ZYRTEC) 10 MG tablet Take 10 mg by mouth.    [provider]  diphenhydrAMINE (BENADRYL) 25 mg capsule Take 25 mg by mouth every 6 (six) hours as needed for itching.    [provider]  docusate sodium (COLACE) 100 MG capsule Take 1 capsule (100 mg total) by mouth 2 (two) times daily. 11/20/15   Tressie Stalker, MD  FLUoxetine (PROZAC) 40 MG capsule Take 40 mg by mouth daily.    [provider]  hydrochlorothiazide (HYDRODIURIL) 12.5 MG  tablet Take 12.5 mg by mouth daily.  06/08/17   [provider]  ibuprofen (ADVIL,MOTRIN) 800 MG tablet TAKE ONE TABLET BY MOUTH EVERY 8 HOURS AS NEEDED 04/24/14   Huston Foley, MD  Linaclotide [facility]) 145 MCG CAPS capsule Take 145 mcg by mouth daily as needed (for constipation).    [provider]  meclizine (ANTIVERT) 25 MG tablet Take 1 tablet (25 mg total) by mouth 3 (three) times daily as needed for dizziness. 08/11/14   Anson Fret, MD  pregabalin (LYRICA) 300 MG capsule Take 300 mg by mouth at bedtime.     [provider]  promethazine (PHENERGAN) 25 MG tablet Take 1 tablet (25 mg total) by mouth every 8 (eight) hours as needed for  nausea or vomiting. 09/04/14   Anson Fret, MD  rizatriptan (MAXALT-MLT) 5 MG disintegrating tablet Take 5 mg by mouth daily as needed for migraine. May repeat in 2 hours if needed    [provider]  senna-docusate (SENOKOT-S) 8.6-50 MG tablet Take 1 tablet by mouth as needed.  01/17/16   [provider]  tetrahydrozoline (VISINE) 0.05 % ophthalmic solution Place 1 drop into both eyes as needed (For allergies).    [provider]  tiZANidine (ZANAFLEX) 4 MG tablet Take 4 mg by mouth at bedtime.     [provider]  traMADol (ULTRAM) 50 MG tablet Take 1 tablet (50 mg total) by mouth every 6 (six) hours as needed. 07/07/17   Bethann Berkshire, MD    Family History Family History  Problem Relation Age of Onset  . Kidney cancer Mother   . Diabetes Mother   . Ulcers Father   . Coronary artery disease Father        pci at age 50  . Prostate cancer Maternal Grandfather   . Pancreatic cancer Maternal Grandmother   . Lung cancer Paternal Grandmother        metastatic  . Aortic aneurysm Unknown   . Colon cancer Neg Hx     Social History Social History   Tobacco Use  . Smoking status: Never Smoker  . Smokeless tobacco: Never Used  Substance Use Topics  . Alcohol use: No    Alcohol/week:  0.0 oz  . Drug use: No     Allergies   Ambien [zolpidem tartrate]; Penicillins; Topamax [topiramate]; Chlorhexidine gluconate; Hydrocodone-acetaminophen; Latex; and Versed [midazolam]   Review of Systems Review of Systems  Constitutional: Positive for fever.  HENT: Positive for mouth sores. Negative for rhinorrhea and sore throat.   Eyes: Negative for pain and visual disturbance.  Respiratory: Positive for cough and shortness of breath.   Cardiovascular: Negative for chest pain.  Gastrointestinal: Positive for abdominal pain, diarrhea, nausea and vomiting. Negative for constipation, hematochezia and melena.  Genitourinary: Negative for dysuria, frequency, hematuria, vaginal bleeding and vaginal discharge.  Musculoskeletal: Negative for arthralgias and myalgias.  Skin: Positive for rash. Negative for pallor.  Neurological: Negative for seizures, syncope and headaches.     Physical Exam Updated Vital Signs BP (!) 144/71   Pulse (!) 122   Temp (!) 101.5 F (38.6 C) (Oral)   Resp 20   SpO2 99%   Physical Exam  Constitutional: She appears well-developed and well-nourished. She appears ill.  HENT:  Head: Normocephalic and atraumatic.  Right Ear: External ear normal.  Left Ear: External ear normal.  Mouth/Throat: Uvula is midline. Mucous membranes are dry. No tonsillar exudate.  Shunt palpated with some mild tenderness.  She says this is baseline.  No obvious fluctuance and no erythema noted.  Eyes: Pupils are equal, round, and reactive to light. EOM are normal.  Neck: Normal range of motion.  Cardiovascular: Regular rhythm and normal pulses. Tachycardia present.  Pulmonary/Chest: Effort normal. She has no wheezes. She has rhonchi (r base). She has no rales.  Abdominal: Soft. Normal appearance. She exhibits no mass. There is generalized tenderness. There is no rigidity and no guarding.  Musculoskeletal: Normal range of motion. She exhibits no tenderness.  Neurological: She is  alert.  Skin: Skin is warm and dry. Capillary refill takes less than 2 seconds. Rash (She has some crusting lesions under her nares and on her upper lip.) noted.  Psychiatric: She has a normal mood and affect.  Nursing note and vitals reviewed.    ED Treatments / Results  Labs (all labs ordered are listed, but only abnormal results are displayed) Labs Reviewed  URINALYSIS, ROUTINE W REFLEX MICROSCOPIC - Abnormal; Notable for the following components:      Result Value   Color, Urine AMBER (*)    APPearance CLOUDY (*)    Hgb urine dipstick SMALL (*)    Protein, ur 30 (*)    Bacteria, UA RARE (*)    All other components within normal limits  COMPREHENSIVE METABOLIC PANEL - Abnormal; Notable for the following components:   Potassium 3.1 (*)    Chloride 93 (*)    Glucose, Bld 128 (*)    Calcium 8.7 (*)    Albumin 2.9 (*)    All other components within normal limits  LIPASE, BLOOD - Abnormal; Notable for the following components:   Lipase 53 (*)    All other components within normal limits  CBC WITH DIFFERENTIAL/PLATELET - Abnormal; Notable for the following components:   WBC 16.4 (*)    All other components within normal limits  I-STAT CG4 LACTIC ACID, ED - Abnormal; Notable for the following components:   Lactic Acid, Venous 2.46 (*)    All other components within normal limits  CULTURE, BLOOD (ROUTINE X 2)  CULTURE, BLOOD (ROUTINE X 2)  URINE CULTURE  I-STAT CG4 LACTIC ACID, ED    EKG EKG Interpretation  Date/Time:  Wednesday Jul 15 2017 16:37:46 EDT Ventricular Rate:  115 PR Interval:    QRS Duration: 69 QT Interval:  331 QTC Calculation: 458 R Axis:   23 Text Interpretation:  Sinus tachycardia Low voltage, precordial leads Borderline T abnormalities, anterior leads increased rate and nonspecific ts compared with prior 2/17 Confirmed by Meridee Score [phone]) on 07/15/2017 4:42:44 PM   Radiology Dg Chest 2 View  Result Date: 07/15/2017 CLINICAL DATA:  Cough  with yellowish phlegm, fever and dyspnea for a few days. EXAM: CHEST - 2 VIEW COMPARISON:  None. FINDINGS: Right basilar atelectasis and/or pneumonia in the right middle and lower lobe distribution. Left lung remains clear. Heart and mediastinal contours are stable and within normal limits. No acute osseous abnormality is seen. IMPRESSION: Right middle and lower lobe atelectasis and/or pneumonia. Electronically Signed   By: Tollie Eth M.D.   On: 07/15/2017 18:36   Ct Head Wo Contrast  Result Date: 07/15/2017 CLINICAL DATA:  Dizziness, nausea and vomiting. History of pseudotumor cerebri with VP shunt in 2017. EXAM: CT HEAD WITHOUT CONTRAST TECHNIQUE: Contiguous axial images were obtained from the base of the skull through the vertex without intravenous contrast. COMPARISON:  11/20/2015 FINDINGS: Brain: Right transverse sinus stent, new since prior exam is noted presumably for the patient's history of pseudotumor cerebri. Left-sided this posterior parietal VP shunt has been removed and there is a new left anterior approach VP shunt with tip in the left anterolateral ventricle. No ventriculomegaly is identified. No acute intracranial hemorrhage, large vascular territory infarct, midline shift or edema. No intra-axial mass nor extra-axial fluid collections. Midline fourth ventricle and basal cisterns without effacement. Vascular: No hyperdense vessel sign. Skull: No acute skull fracture or suspicious osseous lesions. Sinuses/Orbits: Intact orbits and globes. Other: None IMPRESSION: 1. New left anterior approach VP shunt with tip in the left body of the lateral ventricle. No ventriculomegaly is identified. 2. New right transverse sinus stent for presumed the patient's intracranial hypertension. 3. No acute intracranial abnormality is identified. Electronically Signed   By: Tollie Eth  M.D.   On: 07/15/2017 19:10   Ct Abdomen Pelvis W Contrast  Result Date: 07/15/2017 CLINICAL DATA:  Dizziness with diarrhea x2  weeks. Nausea and vomiting with fever. History of pseudotumor cerebri with VP shunt 2017. EXAM: CT ABDOMEN AND PELVIS WITH CONTRAST TECHNIQUE: Multidetector CT imaging of the abdomen and pelvis was performed using the standard protocol following bolus administration of intravenous contrast. CONTRAST:  ISOVUE-300 IOPAMIDOL (ISOVUE-300) INJECTION 61% COMPARISON:  07/07/2017, 12/16/2011 FINDINGS: Lower chest: New small right pleural effusion with atelectasis in the right middle and lower lobes. Left lung base is clear. Heart size is top normal without pericardial effusion or thickening. Hepatobiliary: No space-occupying mass of the liver. The patient is status post cholecystectomy. No biliary dilatation is seen. Ventriculoperitoneal catheter is noted along the surface of the right hepatic lobe. Small amount of perihepatic fluid is noted, overlying the dome of the liver. Pancreas: Normal Spleen: Normal in size without focal abnormality. Adrenals/Urinary Tract: Normal bilateral adrenal glands. 4 mm nonobstructing right lower pole renal calculus. No hydroureteronephrosis. Small exophytic 6 mm hypodensity off the interpolar aspect of the left kidney is compatible with a cyst possibly complex with thin internal septations. This is too small to further characterize. It appears relatively stable in size since 2013 and is compatible with a benign finding. The urinary bladder is unremarkable. Stomach/Bowel: Decompressed stomach. Moderate new fluid-filled distention of jejunal loops in the mid abdomen up to 4 cm in caliber associated leading up to an area of segmental mural thickening of small bowel suspicious for new inflammatory change and enteritis. This may be contributing to early or partial SBO. Normal-appearing appendix. The colon is unremarkable. Vascular/Lymphatic: No significant vascular findings are present. No enlarged abdominal or pelvic lymph nodes. Reproductive: Status post hysterectomy. No adnexal masses.  Tubal ligation clips are present bilaterally. Other: No free air.  Trace free fluid in the pelvis. Musculoskeletal: No acute or significant osseous findings. IMPRESSION: 1. New small right pleural effusion with right middle and lower lobe atelectasis. 2. New segmental area of transmural thickening and inflammation involving jejunum identified in the left hemiabdomen with dilatation of more proximal small intestine up to 4 cm. Findings may represent stigmata of small bowel enteritis, series 2/66. Early or partial SBO is not entirely excluded due to the thickening and inflammation. 3. New small volume of fluid near the dome of the diaphragm associated with the VP shunt. 4. Stable too small to characterize cyst in the left lower pole of the kidney measuring 6 mm. Electronically Signed   By: Tollie Eth M.D.   On: 07/15/2017 19:03    Procedures .Critical Care Performed by: Terrilee Files, MD Authorized by: Terrilee Files, MD   Critical care provider statement:    Critical care time (minutes):  40   Critical care was necessary to treat or prevent imminent or life-threatening deterioration of the following conditions:  Sepsis   Critical care was time spent personally by me on the following activities:  Development of treatment plan with patient or surrogate, evaluation of patient's response to treatment, examination of patient, obtaining history from patient or surrogate, ordering and performing treatments and interventions, ordering and review of radiographic studies, ordering and review of laboratory studies, pulse oximetry, re-evaluation of patient's condition and review of old charts   (including critical care time)  Medications Ordered in ED Medications  sodium chloride 0.9 % bolus 1,000 mL (1,000 mLs Intravenous Not Given 07/15/17 1633)    And  sodium chloride 0.9 %  bolus 1,000 mL (0 mLs Intravenous Stopped 07/15/17 2036)    And  sodium chloride 0.9 % bolus 1,000 mL (1,000 mLs Intravenous  Not Given 07/15/17 2103)    And  sodium chloride 0.9 % bolus 1,000 mL (1,000 mLs Intravenous Not Given 07/15/17 2104)  metroNIDAZOLE (FLAGYL) IVPB 500 mg (500 mg Intravenous New Bag/Given 07/15/17 2346)  levofloxacin (LEVAQUIN) IVPB 500 mg (has no administration in time range)  acyclovir cream (ZOVIRAX) 5 % 1 application (1 application Topical Given 07/16/17 0002)  dextrose 5 %-0.45 % sodium chloride infusion ( Intravenous New Bag/Given 07/15/17 2037)  HYDROmorphone (DILAUDID) injection 0.5 mg (0.5 mg Intravenous Given 07/15/17 2246)  ondansetron (ZOFRAN) injection 4 mg (4 mg Intravenous Given 07/16/17 0001)  acetaminophen (TYLENOL) tablet 650 mg (has no administration in time range)  sodium chloride 0.9 % bolus 1,000 mL (0 mLs Intravenous Stopped 07/15/17 1838)  ondansetron (ZOFRAN) injection 4 mg (4 mg Intravenous Given 07/15/17 1621)  HYDROmorphone (DILAUDID) injection 1 mg (1 mg Intravenous Given 07/15/17 1621)  acetaminophen (TYLENOL) tablet 650 mg (650 mg Oral Given 07/15/17 1620)  levofloxacin (LEVAQUIN) IVPB 750 mg (0 mg Intravenous Stopped 07/15/17 1838)  metroNIDAZOLE (FLAGYL) IVPB 500 mg (0 mg Intravenous Stopped 07/15/17 1938)  potassium chloride 10 mEq in 100 mL IVPB (0 mEq Intravenous Stopped 07/15/17 2137)  iopamidol (ISOVUE-300) 61 % injection 100 mL (100 mLs Intravenous Contrast Given 07/15/17 1801)  HYDROmorphone (DILAUDID) injection 1 mg (1 mg Intravenous Given 07/15/17 1930)  HYDROmorphone (DILAUDID) injection 1 mg (1 mg Intravenous Given 07/15/17 2357)     Initial Impression / Assessment and Plan / ED Course  I have reviewed the triage vital signs and the nursing notes.  Pertinent labs & imaging results that were available during my care of the patient were reviewed by me and considered in my medical decision making (see chart for details).  Clinical Course as of Jul 17 54  Wed Jul 15, 2017  1607 Patient presents with 10 days of fever and abdominal pain associated with nausea  vomiting diarrhea.   her initial vital signs are consistent with Sirs and lactate is pending.  I have ordered some pain medicine and IV fluids nausea medicine.   [MB]  1646 Lab work back so far  concerning for an elevated white count of 16.4 and a low potassium 3.1.  Her LFTs are unremarkable renal function okay.  Lactate elevated 246.  We have started the sepsis protocol and antibiotics and fluids have been ordered.   [MB]  1710 Reevaluated patient and updated her on current lab results.  Fluids are going and antibiotics are going on.  Her pain is improved.  Awaiting imaging.   [MB]  1937 Basophil: 0 [MB]  1940 Due the patient's persistent abdominal pain and her prior sepsis criteria labs and vitals she will need to be admitted to the hospital.  With her VP shunts and all of her neurosurgical work-up at [facility] I have contacted the admitting service there to see if they be willing to accept her.   I asked the patient prior to initiation of this if she would be willing to accept transfer to [facility] if they had availability for her and she said yes.   [MB]  1952 Discussed with Melissa at [facility] who is accepted the patient to a floor bed under the service of internal medicine Dr. Dierdre Forth.  Currently there are no beds available and they will contact us when there is an open bed and she can be  physically transferred.  I updated the patient on the plan.   [MB]    Clinical Course User Index [MB] Terrilee Files, MD     Final Clinical Impressions(s) / ED Diagnoses   Final diagnoses:  Sepsis, due to unspecified organism [facility])  Generalized abdominal pain    ED Discharge Orders    None       Terrilee Files, MD 07/16/17 [phone]

## 2017-07-15 NOTE — ED Triage Notes (Signed)
Pt  Returns, ED visit on 5/14, pt has not eaten in 10 days, fever, Saw Dr Lissa Hoard and was sent back to ED. Continue to have vomiting and diarrhea, blister on lips up into nose

## 2017-07-15 NOTE — Patient Instructions (Signed)
I have sent you to the emergency room. They can draw blood work, start IV fluids, and pursue imaging. You are dehydrated and it's not entirely clear what is going on, but this can be sorted out further in the emergency room. I let them know you are coming.  I will see you back in a few weeks once this acute illness resolves.  It was a pleasure to see you today. I strive to create trusting relationships with patients to provide genuine, compassionate, and quality care. I value your feedback. If you receive a survey regarding your visit,  I greatly appreciate you taking time to fill this out.   Gelene Mink, PhD, ANP-BC [facility]

## 2017-07-15 NOTE — Assessment & Plan Note (Addendum)
2 week history of severe abdominal pain, associated diarrhea, N/V. Presented to ED 5/14 with CT as noted in HPI: no obvious abscess. ?chronic changes from known VP shunt. Notably, she had visible erythema behind left ear starting on 5/14, now resolved, which is at location of where shunt tracts. Clinically dehydrated with chapped lips, sores around mouth. Appears acutely ill. Fever of 101.3 despite alternating tylenol and Ibuprofen. Reports dark urine. Has been unable to be seen at [facility] (who placed VP shunt) due to severity of symptoms. Fever has been present at least a week.   I have sent her to the ED. Abdominal exam with guarding, rebound, significantly TTP.  Concern for peritonitis. Discussed with triage nurse and ED physician.   Once acute illness is addressed, we will see her back in the office to discuss elective EGD with dilation, as she has a history of multiple dilations in the past.

## 2017-07-15 NOTE — ED Notes (Signed)
Dr. Manter Merle of critical Lactic Acid.

## 2017-07-15 NOTE — Progress Notes (Signed)
Primary Care Physician:  Ignatius Specking, MD Primary Gastroenterologist:  Dr. Jena Gauss   Chief Complaint  Patient presents with  . Nausea  . Diarrhea  . Abdominal Pain    around belly button and RUQ and RLQ  . Fever    HPI:   Sandra [name] is a 42 y.o. female presenting today as self referral to to abdominal pain, N/V/D for the last 2 weeks. Her history is significant for pseudotumor cerebri s/p VP shunt in 2017. She is followed at [facility]. Other chronic medical issues as noted in PMH. She was seen in the ED on 5/14 due to similar symptoms. Mild leukocytosis at 12.6. Cr mildly elevated 1.10. CT abd/pelvis with contrast while in ED 07/07/17 with mild amount of loculated fluid mid abdomen likely secondary to VP shunt catheter tip. Appendix appeared normal. NON-obstructing 2 mm renal calculus right kidney. While in ED prior to discharge, temp 100.6. She was to follow-up with GI.    Saw Salley Slaughter, NP with Dr. Sherril Croon on Thursday. Questioned ?virus?  Has been taking Ibuprofen and Tylenol round the clock. Temp 101.3 now.   Pain feels like a knife. Pain originally periumbilically and to right of abdomen. Now entire abdomen and radiating to back. Constant. Tries to get still in the bed to focus and relieve pain. Just started tolerating liquids on Sunday. She has been sipping on this. States urine was red when at the PCP's office. Still dark brown. She notes the skin behind her left ear was red on 5/14 and has a picture to show me, which I viewed and was noticeably red. She states this is where the shunt tracts. She had called [facility] and states that she was given an appt but unable to be seen due to severity of illness.   Diarrhea X 2 weeks. N/V. No vomiting since Sunday evening. Severe nausea. Diarrhea about 3-4 episodes a day. Prescribed phenergan suppositories. No recent exposure to antibiotics. +dizziness, headaches. Some blurred vision.     As separate issue: has noted dysphagia last few  months. History of multiple dilations in the past.    EGD, 10/09-->small hiatal hernia EGD, 2006-->noncritical Schatzki's ring proximal to GEJ, s/p 54 and 74F Maloney dilatioin. ?Candida esophagitis,  TCS, 2003-->moderate external hemorrhoids with normal TI  EGD 2003: small sliding hiatal hernia, erosive gastritis.  EGD 2004: ERE with Schatzki's ring s/p dilation, erosive antral gastritis   Past Medical History:  Diagnosis Date  . Allergy    takes Claritin daily  . Anxiety   . Arthritis   . Chronic back pain    buldging/herniated disc,stenosis,mild scoliosis/arthritis  . Complication of anesthesia    itching  . Constipation    takes Linzess as needed  . Depression    takes Fluoxetine daily  . Dyspnea   . Fibromyalgia    takes Lyrica daily  . GERD (gastroesophageal reflux disease)    with esophageal stricture.Takes Protonix daily  . History of bronchitis    1-2 yrs ago  . History of gastric ulcer   . History of hiatal hernia    stomach ulcer ==  had one at age 13  . Hypertension    not on any meds  . Joint pain   . Joint swelling   . Migraines    takes Maxalt as needed.Last migraine a wk ago  . Muscle spasm    takes Zanaflex as needed  . Nocturia   . Pneumonia    hx of  .  PONV (postoperative nausea and vomiting)    usually phenergan helps  . POTS (postural orthostatic tachycardia syndrome)    possible, episodes of sinus tachycardis, holter in 2007 with sinus tachycardia with rates as high as 160  . S/P Botox injection    for migraines  . Spinal headache 2016   blood patch   . Vertigo    takes Anitvert as needed  . Weakness    numbness and tingling in left leg    Past Surgical History:  Procedure Laterality Date  . ABDOMINAL HYSTERECTOMY    . BACK SURGERY    . BLADDER SURGERY     bladder tact  . BREAST SURGERY Left    Lumpectomy  . CARDIAC CATHETERIZATION  2010  . CHOLECYSTECTOMY    . COLONOSCOPY    . CYSTOSCOPY W/ URETERAL STENT REMOVAL      stent  placed and removed  . ESOPHAGEAL DILATION     4-5 times  . fluid removal     from brain 4-5 times.  last time 8-9 mths ago @ [facility] imaging  . LUMBAR LAMINECTOMY/DECOMPRESSION MICRODISCECTOMY Left 01/11/2014   Procedure: LUMBAR LAMINECTOMY/DECOMPRESSION MICRODISCECTOMY 1 LEVEL LEFT LUMBAR TWO/THREE;  Surgeon: Tressie Stalker, MD;  Location: [facility] NEURO ORS;  Service: Neurosurgery;  Laterality: Left;  . TUBAL LIGATION    . VENTRICULOPERITONEAL SHUNT Right 11/19/2015   Procedure: VENTRICULAR-PERITONEAL SHUNT RIGHT;  Surgeon: Tressie Stalker, MD;  Location: [facility] NEURO ORS;  Service: Neurosurgery;  Laterality: Right;  Right    Current Outpatient Medications  Medication Sig Dispense Refill  . acyclovir cream (ZOVIRAX) 5 % Apply 1 application topically every 3 (three) hours.    . butalbital-acetaminophen-caffeine (FIORICET, ESGIC) 50-325-40 MG tablet Take 1 tablet by mouth every 6 (six) hours as needed for headache. 20 tablet 0  . cetirizine (ZYRTEC) 10 MG tablet Take 10 mg by mouth.    . diphenhydrAMINE (BENADRYL) 25 mg capsule Take 25 mg by mouth every 6 (six) hours as needed for itching.    . docusate sodium (COLACE) 100 MG capsule Take 1 capsule (100 mg total) by mouth 2 (two) times daily. 60 capsule 0  . FLUoxetine (PROZAC) 40 MG capsule Take 40 mg by mouth daily.    . hydrochlorothiazide (HYDRODIURIL) 12.5 MG tablet Take 12.5 mg by mouth daily.   1  . ibuprofen (ADVIL,MOTRIN) 800 MG tablet TAKE ONE TABLET BY MOUTH EVERY 8 HOURS AS NEEDED 90 tablet 3  . Linaclotide (LINZESS) 145 MCG CAPS capsule Take 145 mcg by mouth daily as needed (for constipation).    . meclizine (ANTIVERT) 25 MG tablet Take 1 tablet (25 mg total) by mouth 3 (three) times daily as needed for dizziness. 30 tablet 1  . pregabalin (LYRICA) 300 MG capsule Take 300 mg by mouth at bedtime.     . promethazine (PHENERGAN) 25 MG tablet Take 1 tablet (25 mg total) by mouth every 8 (eight) hours as needed for nausea or vomiting. 30  tablet 3  . rizatriptan (MAXALT-MLT) 5 MG disintegrating tablet Take 5 mg by mouth daily as needed for migraine. May repeat in 2 hours if needed    . senna-docusate (SENOKOT-S) 8.6-50 MG tablet Take 1 tablet by mouth as needed.     Marland Kitchen tetrahydrozoline (VISINE) 0.05 % ophthalmic solution Place 1 drop into both eyes as needed (For allergies).    Marland Kitchen tiZANidine (ZANAFLEX) 4 MG tablet Take 4 mg by mouth at bedtime.     . traMADol (ULTRAM) 50 MG tablet Take 1 tablet (  50 mg total) by mouth every 6 (six) hours as needed. 20 tablet 0   No current facility-administered medications for this visit.     Allergies as of 07/15/2017 - Review Complete 07/15/2017  Allergen Reaction Noted  . Ambien [zolpidem tartrate] Rash and Other (See Comments) 01/27/2013  . Penicillins Hives and Swelling 01/05/2014  . Topamax [topiramate] Other (See Comments) 01/10/2014  . Chlorhexidine gluconate Itching and Rash 11/19/2015  . Hydrocodone-acetaminophen Itching   . Latex Rash 04/04/2015  . Versed [midazolam] Itching 02/03/2014    Family History  Problem Relation Age of Onset  . Kidney cancer Mother   . Diabetes Mother   . Ulcers Father   . Coronary artery disease Father        pci at age 47  . Prostate cancer Maternal Grandfather   . Pancreatic cancer Maternal Grandmother   . Lung cancer Paternal Grandmother        metastatic  . Aortic aneurysm Unknown   . Colon cancer Neg Hx     Social History   Socioeconomic History  . Marital status: Married    Spouse name: Jobey  . Number of children: 2  . Years of education: 31  . Highest education level: Not on file  Occupational History    Comment: [employer]  Social Needs  . Financial resource strain: Not on file  . Food insecurity:    Worry: Not on file    Inability: Not on file  . Transportation needs:    Medical: Not on file    Non-medical: Not on file  Tobacco Use  . Smoking status: Never Smoker  . Smokeless tobacco: Never Used  Substance and  Sexual Activity  . Alcohol use: No    Alcohol/week: 0.0 oz  . Drug use: No  . Sexual activity: Not on file  Lifestyle  . Physical activity:    Days per week: Not on file    Minutes per session: Not on file  . Stress: Not on file  Relationships  . Social connections:    Talks on phone: Not on file    Gets together: Not on file    Attends religious service: Not on file    Active member of club or organization: Not on file    Attends meetings of clubs or organizations: Not on file    Relationship status: Not on file  . Intimate partner violence:    Fear of current or ex partner: Not on file    Emotionally abused: Not on file    Physically abused: Not on file    Forced sexual activity: Not on file  Other Topics Concern  . Not on file  Social History Narrative   Patient lives at home with her children and she is se prated.   Patient is out of work at this time.   Education one year of college.   Caffeine one cup of sweet daily.    Review of Systems: As mentioned in HPI   Physical Exam: BP 132/82   Pulse (!) 104   Temp (!) 101.3 F (38.5 C) (Oral)   Ht  (1.702 m)   Wt 270 lb 3.2 oz (122.6 kg)   BMI 42.32 kg/m  General:   Alert and oriented. Appears acutely ill with darkened circles under eyes, sores at mouth, tongue, nose, gray lips and dry/chapped, mouth dry.  Head:  Normocephalic and atraumatic. Assessed scalp and location of VP shunt that is palpable, no obvious erythema.  Eyes:  Without icterus, sclera clear and conjunctiva pink.  Ears:  Normal auditory acuity. Behind left ear no visible redness.  Nose:  No deformity, discharge,  or lesions. Abdomen:  Obese, hypoactive bowel sounds, diffusely TTP but moreso RUQ/RLQ, with rebound and guarding. PAINFUL TO LAY FLAT.  Rectal:  Deferred  Msk:  Symmetrical without gross deformities. Normal posture. Extremities:  Without  edema. Neurologic:  Alert and  oriented x4 Psych:  Alert and cooperative. Normal mood and  affect.

## 2017-07-15 NOTE — ED Notes (Signed)
ED Provider at bedside. 

## 2017-07-15 NOTE — Progress Notes (Signed)
Pharmacy Antibiotic Note  Sandra [name] is a 42 y.o. female admitted on 07/15/2017 with intra-abdominal infection.  Pharmacy has been consulted for Levaquin and Flagyl dosing.  Plan: Levaquin 750 mg IV x 1 dose, followed by Levaquin 500 mg IV daily Metronidazole 500 mg IV every 8 hours Monitor labs, c/s, and patient improvement     Temp (24hrs), Avg:101.4 F (38.6 C), Min:101.3 F (38.5 C), Max:101.5 F (38.6 C)  Recent Labs  Lab 07/15/17 1620  LATICACIDVEN 2.46*    Estimated Creatinine Clearance: 90.5 mL/min (A) (by C-G formula based on SCr of 1.1 mg/dL (H)).    Allergies  Allergen Reactions  . Ambien [Zolpidem Tartrate] Rash and Other (See Comments)    Pt states she cannot walk after taking this medication  . Penicillins Hives and Swelling    Has patient had a PCN reaction causing immediate rash, facial/tongue/throat swelling, SOB or lightheadedness with hypotension: Yes Has patient had a PCN reaction causing severe rash involving mucus membranes or skin necrosis: Yes Has patient had a PCN reaction that required hospitalization Yes Has patient had a PCN reaction occurring within the last 10 years: Yes If all of the above answers are "NO", then may proceed with Cephalosporin use.   . Topamax [Topiramate] Other (See Comments)    Gives her kidney stones  . Chlorhexidine Gluconate Itching and Rash    "It felt bees were stinging me"  . Hydrocodone-Acetaminophen Itching    States only Vicodin makes her itch  . Latex Rash  . Versed [Midazolam] Itching    Antimicrobials this admission: Flagyl 5/22 >>  Levaquin 5/22 >>   Dose adjustments this admission: N/A  Microbiology results: 5/22 BCx: pending 5/22 UCx: pending   Thank you for allowing pharmacy to be a part of this patient's care.  Tad Moore 07/15/2017 4:43 PM

## 2017-07-16 DIAGNOSIS — Z90710 Acquired absence of both cervix and uterus: Secondary | ICD-10-CM | POA: Diagnosis not present

## 2017-07-16 DIAGNOSIS — T85730A Infection and inflammatory reaction due to ventricular intracranial (communicating) shunt, initial encounter: Secondary | ICD-10-CM | POA: Diagnosis present

## 2017-07-16 DIAGNOSIS — Z91040 Latex allergy status: Secondary | ICD-10-CM | POA: Diagnosis not present

## 2017-07-16 DIAGNOSIS — A409 Streptococcal sepsis, unspecified: Secondary | ICD-10-CM | POA: Diagnosis present

## 2017-07-16 DIAGNOSIS — Z79899 Other long term (current) drug therapy: Secondary | ICD-10-CM | POA: Diagnosis not present

## 2017-07-16 DIAGNOSIS — B951 Streptococcus, group B, as the cause of diseases classified elsewhere: Secondary | ICD-10-CM | POA: Diagnosis present

## 2017-07-16 DIAGNOSIS — K529 Noninfective gastroenteritis and colitis, unspecified: Secondary | ICD-10-CM | POA: Diagnosis present

## 2017-07-16 DIAGNOSIS — A419 Sepsis, unspecified organism: Secondary | ICD-10-CM | POA: Diagnosis not present

## 2017-07-16 DIAGNOSIS — G002 Streptococcal meningitis: Secondary | ICD-10-CM | POA: Diagnosis not present

## 2017-07-16 DIAGNOSIS — K668 Other specified disorders of peritoneum: Secondary | ICD-10-CM | POA: Diagnosis not present

## 2017-07-16 DIAGNOSIS — R835 Abnormal microbiological findings in cerebrospinal fluid: Secondary | ICD-10-CM | POA: Diagnosis not present

## 2017-07-16 DIAGNOSIS — R1084 Generalized abdominal pain: Secondary | ICD-10-CM | POA: Diagnosis not present

## 2017-07-16 DIAGNOSIS — G039 Meningitis, unspecified: Secondary | ICD-10-CM | POA: Diagnosis present

## 2017-07-16 DIAGNOSIS — G0481 Other encephalitis and encephalomyelitis: Secondary | ICD-10-CM | POA: Diagnosis not present

## 2017-07-16 DIAGNOSIS — I10 Essential (primary) hypertension: Secondary | ICD-10-CM | POA: Diagnosis present

## 2017-07-16 DIAGNOSIS — Z7982 Long term (current) use of aspirin: Secondary | ICD-10-CM | POA: Diagnosis not present

## 2017-07-16 DIAGNOSIS — G932 Benign intracranial hypertension: Secondary | ICD-10-CM | POA: Diagnosis not present

## 2017-07-16 DIAGNOSIS — G43909 Migraine, unspecified, not intractable, without status migrainosus: Secondary | ICD-10-CM | POA: Diagnosis present

## 2017-07-16 DIAGNOSIS — Z9181 History of falling: Secondary | ICD-10-CM | POA: Diagnosis not present

## 2017-07-16 DIAGNOSIS — Z9049 Acquired absence of other specified parts of digestive tract: Secondary | ICD-10-CM | POA: Diagnosis not present

## 2017-07-16 DIAGNOSIS — K651 Peritoneal abscess: Secondary | ICD-10-CM | POA: Diagnosis present

## 2017-07-16 DIAGNOSIS — N281 Cyst of kidney, acquired: Secondary | ICD-10-CM | POA: Diagnosis present

## 2017-07-16 DIAGNOSIS — Z87442 Personal history of urinary calculi: Secondary | ICD-10-CM | POA: Diagnosis not present

## 2017-07-16 DIAGNOSIS — R109 Unspecified abdominal pain: Secondary | ICD-10-CM | POA: Diagnosis not present

## 2017-07-16 DIAGNOSIS — B955 Unspecified streptococcus as the cause of diseases classified elsewhere: Secondary | ICD-10-CM | POA: Diagnosis not present

## 2017-07-16 DIAGNOSIS — K589 Irritable bowel syndrome without diarrhea: Secondary | ICD-10-CM | POA: Diagnosis present

## 2017-07-16 DIAGNOSIS — R935 Abnormal findings on diagnostic imaging of other abdominal regions, including retroperitoneum: Secondary | ICD-10-CM | POA: Diagnosis not present

## 2017-07-16 DIAGNOSIS — Z885 Allergy status to narcotic agent status: Secondary | ICD-10-CM | POA: Diagnosis not present

## 2017-07-16 DIAGNOSIS — Z982 Presence of cerebrospinal fluid drainage device: Secondary | ICD-10-CM | POA: Diagnosis not present

## 2017-07-16 DIAGNOSIS — Z888 Allergy status to other drugs, medicaments and biological substances status: Secondary | ICD-10-CM | POA: Diagnosis not present

## 2017-07-16 DIAGNOSIS — Z880 Allergy status to penicillin: Secondary | ICD-10-CM | POA: Diagnosis not present

## 2017-07-16 DIAGNOSIS — Z969 Presence of functional implant, unspecified: Secondary | ICD-10-CM | POA: Diagnosis not present

## 2017-07-16 DIAGNOSIS — G0490 Encephalitis and encephalomyelitis, unspecified: Secondary | ICD-10-CM | POA: Diagnosis not present

## 2017-07-16 DIAGNOSIS — J90 Pleural effusion, not elsewhere classified: Secondary | ICD-10-CM | POA: Diagnosis present

## 2017-07-16 DIAGNOSIS — A401 Sepsis due to streptococcus, group B: Secondary | ICD-10-CM | POA: Diagnosis not present

## 2017-07-16 DIAGNOSIS — G4733 Obstructive sleep apnea (adult) (pediatric): Secondary | ICD-10-CM | POA: Diagnosis present

## 2017-07-16 DIAGNOSIS — Z6841 Body Mass Index (BMI) 40.0 and over, adult: Secondary | ICD-10-CM | POA: Diagnosis not present

## 2017-07-16 DIAGNOSIS — M797 Fibromyalgia: Secondary | ICD-10-CM | POA: Diagnosis present

## 2017-07-16 MED ORDER — ONDANSETRON HCL 4 MG/2ML IJ SOLN
4.0000 mg | Freq: Once | INTRAMUSCULAR | Status: AC
  Administered 2017-07-16: 4 mg via INTRAVENOUS
  Filled 2017-07-16: qty 2

## 2017-07-16 MED ORDER — PROMETHAZINE HCL 25 MG/ML IJ SOLN
25.0000 mg | Freq: Once | INTRAMUSCULAR | Status: AC
  Administered 2017-07-16: 25 mg via INTRAVENOUS
  Filled 2017-07-16: qty 1

## 2017-07-16 MED ORDER — HYDROMORPHONE HCL 1 MG/ML IJ SOLN
1.0000 mg | Freq: Once | INTRAMUSCULAR | Status: AC
  Administered 2017-07-16: 1 mg via INTRAVENOUS
  Filled 2017-07-16: qty 1

## 2017-07-16 NOTE — Progress Notes (Signed)
cc'd to pcp 

## 2017-07-16 NOTE — ED Notes (Signed)
Pt waiting for bed availability. Possible transfer to [facility].

## 2017-07-17 LAB — URINE CULTURE
CULTURE: NO GROWTH
Special Requests: NORMAL

## 2017-07-17 MED ORDER — DOCUSATE SODIUM 100 MG PO CAPS
100.00 | ORAL_CAPSULE | ORAL | Status: DC
Start: ? — End: 2017-07-17

## 2017-07-17 MED ORDER — CIPROFLOXACIN IN D5W 400 MG/200ML IV SOLN
400.00 | INTRAVENOUS | Status: DC
Start: 2017-07-17 — End: 2017-07-17

## 2017-07-17 MED ORDER — ALBUTEROL SULFATE (2.5 MG/3ML) 0.083% IN NEBU
2.50 | INHALATION_SOLUTION | RESPIRATORY_TRACT | Status: DC
Start: ? — End: 2017-07-17

## 2017-07-17 MED ORDER — AMITRIPTYLINE HCL 10 MG PO TABS
10.00 | ORAL_TABLET | ORAL | Status: DC
Start: 2017-07-21 — End: 2017-07-17

## 2017-07-17 MED ORDER — DIPHENHYDRAMINE HCL 25 MG PO CAPS
25.00 | ORAL_CAPSULE | ORAL | Status: DC
Start: ? — End: 2017-07-17

## 2017-07-17 MED ORDER — SENNOSIDES-DOCUSATE SODIUM 8.6-50 MG PO TABS
2.00 | ORAL_TABLET | ORAL | Status: DC
Start: 2017-07-17 — End: 2017-07-17

## 2017-07-17 MED ORDER — PREGABALIN 100 MG PO CAPS
300.00 | ORAL_CAPSULE | ORAL | Status: DC
Start: 2017-07-21 — End: 2017-07-17

## 2017-07-17 MED ORDER — POTASSIUM CHLORIDE CRYS ER 20 MEQ PO TBCR
EXTENDED_RELEASE_TABLET | ORAL | Status: DC
Start: 2017-07-17 — End: 2017-07-17

## 2017-07-17 MED ORDER — BISACODYL 5 MG PO TBEC
10.00 | DELAYED_RELEASE_TABLET | ORAL | Status: DC
Start: ? — End: 2017-07-17

## 2017-07-17 MED ORDER — HYDROMORPHONE HCL 1 MG/ML IJ SOLN
1.00 | INTRAMUSCULAR | Status: DC
Start: ? — End: 2017-07-17

## 2017-07-17 MED ORDER — PANTOPRAZOLE SODIUM 40 MG PO TBEC
40.00 | DELAYED_RELEASE_TABLET | ORAL | Status: DC
Start: 2017-07-22 — End: 2017-07-17

## 2017-07-17 MED ORDER — METRONIDAZOLE IN NACL 5-0.79 MG/ML-% IV SOLN
500.00 | INTRAVENOUS | Status: DC
Start: 2017-07-17 — End: 2017-07-17

## 2017-07-17 MED ORDER — OXYCODONE HCL 5 MG PO TABS
5.00 | ORAL_TABLET | ORAL | Status: DC
Start: ? — End: 2017-07-17

## 2017-07-17 MED ORDER — FLUOXETINE HCL 20 MG PO CAPS
40.00 | ORAL_CAPSULE | ORAL | Status: DC
Start: 2017-07-22 — End: 2017-07-17

## 2017-07-17 MED ORDER — KCL IN DEXTROSE-NACL 10-5-0.45 MEQ/L-%-% IV SOLN
INTRAVENOUS | Status: DC
Start: ? — End: 2017-07-17

## 2017-07-17 MED ORDER — ENOXAPARIN SODIUM 40 MG/0.4ML ~~LOC~~ SOLN
40.00 | SUBCUTANEOUS | Status: DC
Start: 2017-07-18 — End: 2017-07-17

## 2017-07-17 MED ORDER — CETIRIZINE HCL 10 MG PO TABS
10.00 | ORAL_TABLET | ORAL | Status: DC
Start: 2017-07-21 — End: 2017-07-17

## 2017-07-17 MED ORDER — PROMETHAZINE HCL 25 MG PO TABS
25.00 | ORAL_TABLET | ORAL | Status: DC
Start: ? — End: 2017-07-17

## 2017-07-20 LAB — CULTURE, BLOOD (ROUTINE X 2)
CULTURE: NO GROWTH
CULTURE: NO GROWTH
Special Requests: ADEQUATE

## 2017-07-21 MED ORDER — HYDROMORPHONE HCL 1 MG/ML IJ SOLN
1.00 | INTRAMUSCULAR | Status: DC
Start: ? — End: 2017-07-21

## 2017-07-21 MED ORDER — PROMETHAZINE HCL 25 MG PO TABS
25.00 | ORAL_TABLET | ORAL | Status: DC
Start: ? — End: 2017-07-21

## 2017-07-21 MED ORDER — ONDANSETRON 4 MG PO TBDP
8.00 | ORAL_TABLET | ORAL | Status: DC
Start: ? — End: 2017-07-21

## 2017-07-21 MED ORDER — ACETAMINOPHEN 500 MG PO TABS
1000.00 | ORAL_TABLET | ORAL | Status: DC
Start: ? — End: 2017-07-21

## 2017-07-21 MED ORDER — GENERIC EXTERNAL MEDICATION
2.00 g | Status: DC
Start: 2017-07-22 — End: 2017-07-21

## 2017-08-05 DIAGNOSIS — Z982 Presence of cerebrospinal fluid drainage device: Secondary | ICD-10-CM | POA: Diagnosis not present

## 2017-08-05 DIAGNOSIS — T85618D Breakdown (mechanical) of other specified internal prosthetic devices, implants and grafts, subsequent encounter: Secondary | ICD-10-CM | POA: Diagnosis not present

## 2017-08-06 DIAGNOSIS — Z6841 Body Mass Index (BMI) 40.0 and over, adult: Secondary | ICD-10-CM | POA: Diagnosis not present

## 2017-08-06 DIAGNOSIS — T85730D Infection and inflammatory reaction due to ventricular intracranial (communicating) shunt, subsequent encounter: Secondary | ICD-10-CM | POA: Diagnosis not present

## 2017-08-12 DIAGNOSIS — R51 Headache: Secondary | ICD-10-CM | POA: Diagnosis not present

## 2017-08-12 DIAGNOSIS — Z09 Encounter for follow-up examination after completed treatment for conditions other than malignant neoplasm: Secondary | ICD-10-CM | POA: Diagnosis not present

## 2017-08-28 ENCOUNTER — Ambulatory Visit: Payer: Medicaid Other | Admitting: Gastroenterology

## 2017-09-04 DIAGNOSIS — R109 Unspecified abdominal pain: Secondary | ICD-10-CM | POA: Diagnosis not present

## 2017-09-04 DIAGNOSIS — G932 Benign intracranial hypertension: Secondary | ICD-10-CM | POA: Diagnosis not present

## 2017-09-04 DIAGNOSIS — T85730D Infection and inflammatory reaction due to ventricular intracranial (communicating) shunt, subsequent encounter: Secondary | ICD-10-CM | POA: Diagnosis not present

## 2017-09-23 DIAGNOSIS — E7800 Pure hypercholesterolemia, unspecified: Secondary | ICD-10-CM | POA: Diagnosis not present

## 2017-09-23 DIAGNOSIS — Z6841 Body Mass Index (BMI) 40.0 and over, adult: Secondary | ICD-10-CM | POA: Diagnosis not present

## 2017-09-23 DIAGNOSIS — Z789 Other specified health status: Secondary | ICD-10-CM | POA: Diagnosis not present

## 2017-09-23 DIAGNOSIS — Z299 Encounter for prophylactic measures, unspecified: Secondary | ICD-10-CM | POA: Diagnosis not present

## 2017-09-23 DIAGNOSIS — J069 Acute upper respiratory infection, unspecified: Secondary | ICD-10-CM | POA: Diagnosis not present

## 2017-09-23 DIAGNOSIS — I10 Essential (primary) hypertension: Secondary | ICD-10-CM | POA: Diagnosis not present

## 2017-10-08 DIAGNOSIS — G932 Benign intracranial hypertension: Secondary | ICD-10-CM | POA: Diagnosis not present

## 2017-10-08 DIAGNOSIS — T85730S Infection and inflammatory reaction due to ventricular intracranial (communicating) shunt, sequela: Secondary | ICD-10-CM | POA: Diagnosis not present

## 2017-10-08 DIAGNOSIS — Z09 Encounter for follow-up examination after completed treatment for conditions other than malignant neoplasm: Secondary | ICD-10-CM | POA: Diagnosis not present

## 2017-10-08 DIAGNOSIS — Z885 Allergy status to narcotic agent status: Secondary | ICD-10-CM | POA: Diagnosis not present

## 2017-10-08 DIAGNOSIS — Z91040 Latex allergy status: Secondary | ICD-10-CM | POA: Diagnosis not present

## 2017-10-08 DIAGNOSIS — Z6841 Body Mass Index (BMI) 40.0 and over, adult: Secondary | ICD-10-CM | POA: Diagnosis not present

## 2017-10-08 DIAGNOSIS — Z87898 Personal history of other specified conditions: Secondary | ICD-10-CM | POA: Diagnosis not present

## 2017-10-08 DIAGNOSIS — Z880 Allergy status to penicillin: Secondary | ICD-10-CM | POA: Diagnosis not present

## 2017-10-08 DIAGNOSIS — Z886 Allergy status to analgesic agent status: Secondary | ICD-10-CM | POA: Diagnosis not present

## 2017-10-08 DIAGNOSIS — Z888 Allergy status to other drugs, medicaments and biological substances status: Secondary | ICD-10-CM | POA: Diagnosis not present

## 2017-11-20 DIAGNOSIS — J329 Chronic sinusitis, unspecified: Secondary | ICD-10-CM | POA: Diagnosis not present

## 2017-11-20 DIAGNOSIS — Z6841 Body Mass Index (BMI) 40.0 and over, adult: Secondary | ICD-10-CM | POA: Diagnosis not present

## 2017-11-20 DIAGNOSIS — Z299 Encounter for prophylactic measures, unspecified: Secondary | ICD-10-CM | POA: Diagnosis not present

## 2017-11-20 DIAGNOSIS — G932 Benign intracranial hypertension: Secondary | ICD-10-CM | POA: Diagnosis not present

## 2017-11-20 DIAGNOSIS — Z23 Encounter for immunization: Secondary | ICD-10-CM | POA: Diagnosis not present

## 2017-11-20 DIAGNOSIS — I10 Essential (primary) hypertension: Secondary | ICD-10-CM | POA: Diagnosis not present

## 2017-12-07 DIAGNOSIS — Z6841 Body Mass Index (BMI) 40.0 and over, adult: Secondary | ICD-10-CM | POA: Diagnosis not present

## 2017-12-07 DIAGNOSIS — R5383 Other fatigue: Secondary | ICD-10-CM | POA: Diagnosis not present

## 2017-12-07 DIAGNOSIS — Z1331 Encounter for screening for depression: Secondary | ICD-10-CM | POA: Diagnosis not present

## 2017-12-07 DIAGNOSIS — Z0000 Encounter for general adult medical examination without abnormal findings: Secondary | ICD-10-CM | POA: Diagnosis not present

## 2017-12-07 DIAGNOSIS — Z79899 Other long term (current) drug therapy: Secondary | ICD-10-CM | POA: Diagnosis not present

## 2017-12-07 DIAGNOSIS — Z299 Encounter for prophylactic measures, unspecified: Secondary | ICD-10-CM | POA: Diagnosis not present

## 2017-12-07 DIAGNOSIS — Z1339 Encounter for screening examination for other mental health and behavioral disorders: Secondary | ICD-10-CM | POA: Diagnosis not present

## 2017-12-07 DIAGNOSIS — I10 Essential (primary) hypertension: Secondary | ICD-10-CM | POA: Diagnosis not present

## 2017-12-07 DIAGNOSIS — E7800 Pure hypercholesterolemia, unspecified: Secondary | ICD-10-CM | POA: Diagnosis not present

## 2017-12-07 DIAGNOSIS — Z7189 Other specified counseling: Secondary | ICD-10-CM | POA: Diagnosis not present

## 2017-12-08 ENCOUNTER — Encounter: Payer: Self-pay | Admitting: Gastroenterology

## 2018-01-28 DIAGNOSIS — I10 Essential (primary) hypertension: Secondary | ICD-10-CM | POA: Diagnosis not present

## 2018-01-28 DIAGNOSIS — Z299 Encounter for prophylactic measures, unspecified: Secondary | ICD-10-CM | POA: Diagnosis not present

## 2018-01-28 DIAGNOSIS — Z6841 Body Mass Index (BMI) 40.0 and over, adult: Secondary | ICD-10-CM | POA: Diagnosis not present

## 2018-01-28 DIAGNOSIS — M797 Fibromyalgia: Secondary | ICD-10-CM | POA: Diagnosis not present

## 2018-01-28 DIAGNOSIS — J029 Acute pharyngitis, unspecified: Secondary | ICD-10-CM | POA: Diagnosis not present

## 2018-02-22 DIAGNOSIS — Z6841 Body Mass Index (BMI) 40.0 and over, adult: Secondary | ICD-10-CM | POA: Diagnosis not present

## 2018-02-22 DIAGNOSIS — Z299 Encounter for prophylactic measures, unspecified: Secondary | ICD-10-CM | POA: Diagnosis not present

## 2018-02-22 DIAGNOSIS — I10 Essential (primary) hypertension: Secondary | ICD-10-CM | POA: Diagnosis not present

## 2018-02-22 DIAGNOSIS — M797 Fibromyalgia: Secondary | ICD-10-CM | POA: Diagnosis not present

## 2018-02-22 DIAGNOSIS — J069 Acute upper respiratory infection, unspecified: Secondary | ICD-10-CM | POA: Diagnosis not present

## 2018-02-22 DIAGNOSIS — R05 Cough: Secondary | ICD-10-CM | POA: Diagnosis not present

## 2018-03-17 ENCOUNTER — Encounter: Payer: Self-pay | Admitting: Gastroenterology

## 2018-03-17 ENCOUNTER — Ambulatory Visit (INDEPENDENT_AMBULATORY_CARE_PROVIDER_SITE_OTHER): Payer: Medicare Other | Admitting: Gastroenterology

## 2018-03-17 ENCOUNTER — Other Ambulatory Visit: Payer: Self-pay

## 2018-03-17 ENCOUNTER — Other Ambulatory Visit: Payer: Self-pay | Admitting: *Deleted

## 2018-03-17 VITALS — BP 150/84 | HR 102 | Temp 97.2°F | Ht 67.0 in | Wt 274.2 lb

## 2018-03-17 DIAGNOSIS — R1310 Dysphagia, unspecified: Secondary | ICD-10-CM | POA: Diagnosis not present

## 2018-03-17 DIAGNOSIS — K625 Hemorrhage of anus and rectum: Secondary | ICD-10-CM

## 2018-03-17 DIAGNOSIS — K219 Gastro-esophageal reflux disease without esophagitis: Secondary | ICD-10-CM | POA: Diagnosis not present

## 2018-03-17 MED ORDER — NA SULFATE-K SULFATE-MG SULF 17.5-3.13-1.6 GM/177ML PO SOLN: 1.0000 | ORAL | 0 refills | Status: DC

## 2018-03-17 MED ORDER — PANTOPRAZOLE SODIUM 40 MG PO TBEC: 40.0000 mg | DELAYED_RELEASE_TABLET | Freq: Every day | ORAL | 3 refills | Status: DC

## 2018-03-17 NOTE — Progress Notes (Signed)
Referring Provider: Glenda Chroman, MD Primary Care Physician:  Glenda Chroman, MD  Primary GI: Dr. Gala Romney   Chief Complaint  Patient presents with  . Consult    for colonoscopy/EGD    HPI:   Marvalene Barrett is a 43 y.o. female presenting today with a history significant for pseudotumor cerebri s/p VP shunt in 2017. She is followed at [facility]. Other chronic medical issues as noted in [place]. Last seen in May 2019 and referred straight to the ED due to abdominal pain and fever. Transferred to [facility] due to infected VP shunt and sepsis. VP shunt removed 07/20/17.   Patient states she was told that she needed a screening colonoscopy per Medicare.  Notes intermittent rectal bleeding. Notes intermittent constipation and takes Linzess just as needed. When takes, she will have diarrhea. Linzess 145. Upper abdominal pain/LUQ pain, +GERD. Underlying. Solid food dysphagia. Meat gets hung, water gets hung. Multiple prior EGDs with dilation. No PPI. Last colonoscopy in 2003 with moderate external hemorrhoids and normal TI.   Interested in weight loss surgery. Difficulty in losing weight on her own. Exercise limitations with chronic back pain.   EGD, 10/09-->small hiatal hernia EGD, 2006-->noncritical Schatzki's ring proximal to GEJ, s/p 54 and 24F Maloney dilation. ?Candida esophagitis,  TCS, 2003-->moderate external hemorrhoids with normal TI  EGD 2003: small sliding hiatal hernia, erosive gastritis.  EGD 2004: ERE with Schatzki's ring s/p dilation, erosive antral gastritis      Past Medical History:  Diagnosis Date  . Allergy    takes Claritin daily  . Anxiety   . Arthritis   . Chronic back pain    buldging/herniated disc,stenosis,mild scoliosis/arthritis  . Complication of anesthesia    itching  . Constipation    takes Linzess as needed  . Depression    takes Fluoxetine daily  . Dyspnea   . Fibromyalgia    takes Lyrica daily  . GERD (gastroesophageal reflux disease)    with  esophageal stricture.Takes Protonix daily  . History of bronchitis    1-2 yrs ago  . History of gastric ulcer   . History of hiatal hernia    stomach ulcer ==  had one at age 58  . Hypertension    not on any meds  . Joint pain   . Joint swelling   . Migraines    takes Maxalt as needed.Last migraine a wk ago  . Muscle spasm    takes Zanaflex as needed  . Nocturia   . Pneumonia    hx of  . PONV (postoperative nausea and vomiting)    usually phenergan helps  . POTS (postural orthostatic tachycardia syndrome)    possible, episodes of sinus tachycardis, holter in 2007 with sinus tachycardia with rates as high as 160  . S/P Botox injection    for migraines  . Spinal headache 2016   blood patch   . Vertigo    takes Anitvert as needed  . Weakness    numbness and tingling in left leg    Past Surgical History:  Procedure Laterality Date  . ABDOMINAL HYSTERECTOMY    . BACK SURGERY    . BLADDER SURGERY     bladder tact  . BREAST SURGERY Left    Lumpectomy  . CARDIAC CATHETERIZATION  2010  . CHOLECYSTECTOMY    . COLONOSCOPY  2003   moderate external hemorrhoids with normal TI  . CYSTOSCOPY W/ URETERAL STENT REMOVAL      stent placed and removed  .  ESOPHAGEAL DILATION     4-5 times  . ESOPHAGOGASTRODUODENOSCOPY  2003   small sliding hiatal hernia, erosive gastritis  . ESOPHAGOGASTRODUODENOSCOPY  2004   erosive reflux esophagitis with Schatzki ring s/p dilation, erosive antral gastritis  . ESOPHAGOGASTRODUODENOSCOPY  2006   non-critical Schatzki ring proximal to GEJ, s/p 54 and 56 Maloney dilatation. ?Candida esophagitis  . ESOPHAGOGASTRODUODENOSCOPY  2009   small hiatal hernia  . fluid removal     from brain 4-5 times.  last time 8-9 mths ago @ [facility] imaging  . LUMBAR LAMINECTOMY/DECOMPRESSION MICRODISCECTOMY Left 01/11/2014   Procedure: LUMBAR LAMINECTOMY/DECOMPRESSION MICRODISCECTOMY 1 LEVEL LEFT LUMBAR TWO/THREE;  Surgeon: Newman Pies, MD;  Location: [place] NEURO  ORS;  Service: Neurosurgery;  Laterality: Left;  . TUBAL LIGATION    . VENTRICULOPERITONEAL SHUNT Right 11/19/2015   Procedure: VENTRICULAR-PERITONEAL SHUNT RIGHT;  Surgeon: Newman Pies, MD;  Location: [place] NEURO ORS;  Service: Neurosurgery;  Laterality: Right;  Right    Current Outpatient Medications  Medication Sig Dispense Refill  . butalbital-acetaminophen-caffeine (FIORICET, ESGIC) 50-325-40 MG tablet Take 1 tablet by mouth every 6 (six) hours as needed for headache. 20 tablet 0  . cetirizine (ZYRTEC) 10 MG tablet Take 10 mg by mouth daily.     . diphenhydrAMINE (BENADRYL) 25 mg capsule Take 25 mg by mouth every 6 (six) hours as needed for itching.    . hydrochlorothiazide (HYDRODIURIL) 12.5 MG tablet Take 12.5 mg by mouth daily.   1  . ibuprofen (ADVIL,MOTRIN) 100 MG/5ML suspension Take 300 mg by mouth every 4 (four) hours as needed for fever or mild pain.    . Linaclotide (LINZESS) 145 MCG CAPS capsule Take 145 mcg by mouth daily as needed (for constipation).    . meclizine (ANTIVERT) 25 MG tablet Take 1 tablet (25 mg total) by mouth 3 (three) times daily as needed for dizziness. 30 tablet 1  . pregabalin (LYRICA) 300 MG capsule Take 300 mg by mouth at bedtime.     . rizatriptan (MAXALT-MLT) 5 MG disintegrating tablet Take 5 mg by mouth daily as needed for migraine. May repeat in 2 hours if needed    . tetrahydrozoline (VISINE) 0.05 % ophthalmic solution Place 1 drop into both eyes as needed (For allergies).    . Na Sulfate-K Sulfate-Mg Sulf (SUPREP BOWEL PREP KIT) 17.5-3.13-1.6 GM/177ML SOLN Take 1 kit by mouth as directed. 1 Bottle 0  . pantoprazole (PROTONIX) 40 MG tablet Take 1 tablet (40 mg total) by mouth daily. Take 30 minutes before breakfast daily 90 tablet 3   No current facility-administered medications for this visit.     Allergies as of 03/17/2018 - Review Complete 03/17/2018  Allergen Reaction Noted  . Ambien [zolpidem tartrate] Rash and Other (See Comments) 01/27/2013   . Penicillins Hives and Swelling 01/05/2014  . Topamax [topiramate] Other (See Comments) 01/10/2014  . Chlorhexidine gluconate Itching and Rash 11/19/2015  . Hydrocodone-acetaminophen Itching   . Latex Rash 04/04/2015  . Versed [midazolam] Itching 02/03/2014    Family History  Problem Relation Age of Onset  . Kidney cancer Mother   . Diabetes Mother   . Ulcers Father   . Coronary artery disease Father        pci at age 66  . Prostate cancer Maternal Grandfather   . Pancreatic cancer Maternal Grandmother   . Lung cancer Paternal Grandmother        metastatic  . Aortic aneurysm Other   . Colon cancer Neg Hx     Social History  Socioeconomic History  . Marital status: Married    Spouse name: Jobey  . Number of children: 2  . Years of education: 54  . Highest education level: Not on file  Occupational History    Comment: [place]  Social Needs  . Financial resource strain: Not on file  . Food insecurity:    Worry: Not on file    Inability: Not on file  . Transportation needs:    Medical: Not on file    Non-medical: Not on file  Tobacco Use  . Smoking status: Never Smoker  . Smokeless tobacco: Never Used  Substance and Sexual Activity  . Alcohol use: No    Alcohol/week: 0.0 standard drinks  . Drug use: No  . Sexual activity: Not on file  Lifestyle  . Physical activity:    Days per week: Not on file    Minutes per session: Not on file  . Stress: Not on file  Relationships  . Social connections:    Talks on phone: Not on file    Gets together: Not on file    Attends religious service: Not on file    Active member of club or organization: Not on file    Attends meetings of clubs or organizations: Not on file    Relationship status: Not on file  Other Topics Concern  . Not on file  Social History Narrative   Patient lives at home with her children and she is se prated.   Patient is out of work at this time.   Education one year of college.    Caffeine one cup of sweet daily.    Review of Systems: Gen: Denies fever, chills, anorexia. Denies fatigue, weakness, weight loss.  CV: Denies chest pain, palpitations, syncope, peripheral edema, and claudication. Resp: Denies dyspnea at rest, cough, wheezing, coughing up blood, and pleurisy. GI: see HPI Derm: Denies rash, itching, dry skin Psych: Denies depression, anxiety, memory loss, confusion. No homicidal or suicidal ideation.  Heme: Denies bruising, bleeding, and enlarged lymph nodes.  Physical Exam: BP (!) 150/84   Pulse (!) 102   Temp (!) 97.2 F (36.2 C) (Oral)   Ht '5\' 7"'  (1.702 m)   Wt 274 lb 3.2 oz (124.4 kg)   BMI 42.95 kg/m  General:   Alert and oriented. No distress noted. Pleasant and cooperative.  Head:  Normocephalic and atraumatic. Eyes:  Conjuctiva clear without scleral icterus. Mouth:  Oral mucosa pink and moist.  Lungs: clear to auscultation bilaterally Cardiac: S1 S2 present without murmurs Abdomen:  +BS, soft, non-tender and non-distended. No rebound or guarding. No HSM or masses noted. Msk:  Symmetrical without gross deformities. Normal posture. Extremities:  Without edema. Neurologic:  Alert and  oriented x4 Psych:  Alert and cooperative. Normal mood and affect.

## 2018-03-17 NOTE — Patient Instructions (Signed)
I have sent in Protonix for reflux to take 30 minutes before breakfast daily.  We have arranged a colonoscopy and upper endoscopy/dilation with Dr. Jena Gauss in the near future.  I have given you Linzess at a smaller dosage to take 30 minutes before breakfast. Let me know how this works, and I will send in a prescription.  You can check out [facility] Surgery bariatric program by visiting [email or web address] and registering for a seminar.  I will see you back in approximately 3 months!  It was a pleasure to see you today. I strive to create trusting relationships with patients to provide genuine, compassionate, and quality care. I value your feedback. If you receive a survey regarding your visit,  I greatly appreciate you taking time to fill this out.   Gelene Mink, PhD, ANP-BC [facility]

## 2018-03-18 ENCOUNTER — Telehealth: Payer: Self-pay

## 2018-03-18 NOTE — Telephone Encounter (Signed)
Called and informed pt of pre-op appt 05/17/18 at 9:00am. Letter mailed.

## 2018-03-24 ENCOUNTER — Encounter: Payer: Self-pay | Admitting: Gastroenterology

## 2018-03-24 NOTE — Assessment & Plan Note (Signed)
In setting of constipation, which is not ideally managed. Linzess 145 mcg too strong, so she has been taking prn. Will decrease to Linzess 72 mcg daily. Samples provided and patient to call with update. Last colonoscopy in 2003 with external hemorrhoids and normal TI. As it has been some time, will pursue colonoscopy but likely dealing with benign anorectal source. May be a good candidate for banding.   Proceed with TCS with Dr. Jena Gaussourk in near future: the risks, benefits, and alternatives have been discussed with the patient in detail. The patient states understanding and desires to proceed. Propofol due to polypharmacy Linzess 72 mcg daily

## 2018-03-24 NOTE — Assessment & Plan Note (Signed)
History of dilatations in past with Schatzki ring. Improvement historically. Start PPI. EGD/dilatation as planned.

## 2018-03-24 NOTE — Assessment & Plan Note (Signed)
43 year old female with chronic GERD, no PPI, noting epigastric/LUQ pain and solid food dysphagia. Multiple prior EGDs on file with last in 2009. History of dilatation in past with improvement thereafter. Endorses NSAID use.  Proceed with upper endoscopy/dilatation in the near future with Dr. Jena Gauss. The risks, benefits, and alternatives have been discussed in detail with patient. They have stated understanding and desire to proceed.  Propofol due to polypharmacy Start Protonix once daily Return in 3 months

## 2018-03-24 NOTE — Progress Notes (Signed)
CC'D TO PCP °

## 2018-03-30 DIAGNOSIS — G932 Benign intracranial hypertension: Secondary | ICD-10-CM | POA: Diagnosis not present

## 2018-04-16 DIAGNOSIS — Z8619 Personal history of other infectious and parasitic diseases: Secondary | ICD-10-CM | POA: Diagnosis not present

## 2018-04-16 DIAGNOSIS — Z9689 Presence of other specified functional implants: Secondary | ICD-10-CM | POA: Diagnosis not present

## 2018-04-16 DIAGNOSIS — R51 Headache: Secondary | ICD-10-CM | POA: Diagnosis not present

## 2018-04-16 DIAGNOSIS — G932 Benign intracranial hypertension: Secondary | ICD-10-CM | POA: Diagnosis not present

## 2018-04-16 DIAGNOSIS — Z95828 Presence of other vascular implants and grafts: Secondary | ICD-10-CM | POA: Diagnosis not present

## 2018-04-25 HISTORY — PX: OTHER SURGICAL HISTORY: SHX169

## 2018-04-26 DIAGNOSIS — Z982 Presence of cerebrospinal fluid drainage device: Secondary | ICD-10-CM | POA: Diagnosis not present

## 2018-04-26 DIAGNOSIS — G932 Benign intracranial hypertension: Secondary | ICD-10-CM | POA: Diagnosis not present

## 2018-05-03 DIAGNOSIS — G932 Benign intracranial hypertension: Secondary | ICD-10-CM | POA: Diagnosis not present

## 2018-05-03 DIAGNOSIS — Z01818 Encounter for other preprocedural examination: Secondary | ICD-10-CM | POA: Diagnosis not present

## 2018-05-06 DIAGNOSIS — I498 Other specified cardiac arrhythmias: Secondary | ICD-10-CM | POA: Diagnosis present

## 2018-05-06 DIAGNOSIS — Z982 Presence of cerebrospinal fluid drainage device: Secondary | ICD-10-CM | POA: Diagnosis not present

## 2018-05-06 DIAGNOSIS — G932 Benign intracranial hypertension: Secondary | ICD-10-CM | POA: Diagnosis present

## 2018-05-14 NOTE — Patient Instructions (Signed)
Sandra [name]  05/14/2018     @PREFPERIOPPHARMACY @   Your procedure is scheduled on  05/24/2018  Report to [facility] at  745   A.M.  Call this number if you have problems the morning of surgery:  [phone]   Remember:  Follow the diet and prep instructions given to you by Dr Luvenia Starch office.                      Take these medicines the morning of surgery with A SIP OF WATER  Fioricet(if needed), zyrtec, antivert(if needed), protonix, Maxalt(if needed).    Do not wear jewelry, make-up or nail polish.  Do not wear lotions, powders, or perfumes, or deodorant.  Do not shave 48 hours prior to surgery.  Men may shave face and neck.  Do not bring valuables to the hospital.  [facility] is not responsible for any belongings or valuables.  Contacts, dentures or bridgework may not be worn into surgery.  Leave your suitcase in the car.  After surgery it may be brought to your room.  For patients admitted to the hospital, discharge time will be determined by your treatment team.  Patients discharged the day of surgery will not be allowed to drive home.   Name and phone number of your driver:   family Special instructions:  None  Please read over the following fact sheets that you were given. Anesthesia Post-op Instructions and Care and Recovery After Surgery       Upper Endoscopy, Adult, Care After This sheet gives you information about how to care for yourself after your procedure. Your health care provider may also give you more specific instructions. If you have problems or questions, contact your health care provider. What can I expect after the procedure? After the procedure, it is common to have:  A sore throat.  Mild stomach pain or discomfort.  Bloating.  Nausea. Follow these instructions at home:   Follow instructions from your health care provider about what to eat or drink after your procedure.  Return to your normal activities as told by your  health care provider. Ask your health care provider what activities are safe for you.  Take over-the-counter and prescription medicines only as told by your health care provider.  Do not drive for 24 hours if you were given a sedative during your procedure.  Keep all follow-up visits as told by your health care provider. This is important. Contact a health care provider if you have:  A sore throat that lasts longer than one day.  Trouble swallowing. Get help right away if:  You vomit blood or your vomit looks like coffee grounds.  You have: ? A fever. ? Bloody, black, or tarry stools. ? A severe sore throat or you cannot swallow. ? Difficulty breathing. ? Severe pain in your chest or abdomen. Summary  After the procedure, it is common to have a sore throat, mild stomach discomfort, bloating, and nausea.  Do not drive for 24 hours if you were given a sedative during the procedure.  Follow instructions from your health care provider about what to eat or drink after your procedure.  Return to your normal activities as told by your health care provider. This information is not intended to replace advice given to you by your health care provider. Make sure you discuss any questions you have with your health care provider. Document Released: 08/12/2011 Document Revised: 07/13/2017 Document  Reviewed: 07/13/2017 Elsevier Interactive Patient Education  2019 [employer]  Colonoscopy, Adult, Care After This sheet gives you information about how to care for yourself after your procedure. Your health care provider may also give you more specific instructions. If you have problems or questions, contact your health care provider. What can I expect after the procedure? After the procedure, it is common to have:  A small amount of blood in your stool for 24 hours after the procedure.  Some gas.  Mild abdominal cramping or bloating. Follow these instructions at home: General  instructions  For the first 24 hours after the procedure: ? Do not drive or use machinery. ? Do not sign important documents. ? Do not drink alcohol. ? Do your regular daily activities at a slower pace than normal. ? Eat soft, easy-to-digest foods.  Take over-the-counter or prescription medicines only as told by your health care provider. Relieving cramping and bloating   Try walking around when you have cramps or feel bloated.  Apply heat to your abdomen as told by your health care provider. Use a heat source that your health care provider recommends, such as a moist heat pack or a heating pad. ? Place a towel between your skin and the heat source. ? Leave the heat on for 20-30 minutes. ? Remove the heat if your skin turns bright red. This is especially important if you are unable to feel pain, heat, or cold. You may have a greater risk of getting burned. Eating and drinking   Drink enough fluid to keep your urine pale yellow.  Resume your normal diet as instructed by your health care provider. Avoid heavy or fried foods that are hard to digest.  Avoid drinking alcohol for as long as instructed by your health care provider. Contact a health care provider if:  You have blood in your stool 2-3 days after the procedure. Get help right away if:  You have more than a small spotting of blood in your stool.  You pass large blood clots in your stool.  Your abdomen is swollen.  You have nausea or vomiting.  You have a fever.  You have increasing abdominal pain that is not relieved with medicine. Summary  After the procedure, it is common to have a small amount of blood in your stool. You may also have mild abdominal cramping and bloating.  For the first 24 hours after the procedure, do not drive or use machinery, sign important documents, or drink alcohol.  Contact your health care provider if you have a lot of blood in your stool, nausea or vomiting, a fever, or increased  abdominal pain. This information is not intended to replace advice given to you by your health care provider. Make sure you discuss any questions you have with your health care provider. Document Released: 09/25/2003 Document Revised: 12/03/2016 Document Reviewed: 04/24/2015 Elsevier Interactive Patient Education  2019 [employer] Inc. Monitored Anesthesia Care, Care After These instructions provide you with information about caring for yourself after your procedure. Your health care provider may also give you more specific instructions. Your treatment has been planned according to current medical practices, but problems sometimes occur. Call your health care provider if you have any problems or questions after your procedure. What can I expect after the procedure? After your procedure, you may:  Feel sleepy for several hours.  Feel clumsy and have poor balance for several hours.  Feel forgetful about what happened after the procedure.  Have poor judgment for  several hours.  Feel nauseous or vomit.  Have a sore throat if you had a breathing tube during the procedure. Follow these instructions at home: For at least 24 hours after the procedure:      Have a responsible adult stay with you. It is important to have someone help care for you until you are awake and alert.  Rest as needed.  Do not: ? Participate in activities in which you could fall or become injured. ? Drive. ? Use heavy machinery. ? Drink alcohol. ? Take sleeping pills or medicines that cause drowsiness. ? Make important decisions or sign legal documents. ? Take care of children on your own. Eating and drinking  Follow the diet that is recommended by your health care provider.  If you vomit, drink water, juice, or soup when you can drink without vomiting.  Make sure you have little or no nausea before eating solid foods. General instructions  Take over-the-counter and prescription medicines only as told by  your health care provider.  If you have sleep apnea, surgery and certain medicines can increase your risk for breathing problems. Follow instructions from your health care provider about wearing your sleep device: ? Anytime you are sleeping, including during daytime naps. ? While taking prescription pain medicines, sleeping medicines, or medicines that make you drowsy.  If you smoke, do not smoke without supervision.  Keep all follow-up visits as told by your health care provider. This is important. Contact a health care provider if:  You keep feeling nauseous or you keep vomiting.  You feel light-headed.  You develop a rash.  You have a fever. Get help right away if:  You have trouble breathing. Summary  For several hours after your procedure, you may feel sleepy and have poor judgment.  Have a responsible adult stay with you for at least 24 hours or until you are awake and alert. This information is not intended to replace advice given to you by your health care provider. Make sure you discuss any questions you have with your health care provider. Document Released: 06/03/2015 Document Revised: 09/26/2016 Document Reviewed: 06/03/2015 Elsevier Interactive Patient Education  2019 [employer].

## 2018-05-17 ENCOUNTER — Telehealth: Payer: Self-pay

## 2018-05-17 ENCOUNTER — Encounter (HOSPITAL_COMMUNITY): Payer: Self-pay

## 2018-05-17 ENCOUNTER — Encounter (HOSPITAL_COMMUNITY)
Admission: RE | Admit: 2018-05-17 | Discharge: 2018-05-17 | Disposition: A | Discharge status: Home / Self Care | Payer: Medicare Other | Source: Ambulatory Visit | Attending: Internal Medicine | Admitting: Internal Medicine

## 2018-05-17 NOTE — Telephone Encounter (Signed)
Noted! Thank you

## 2018-05-17 NOTE — Telephone Encounter (Signed)
Pt called office to cancel TCS/EGD/DIL w/Propofol w/RMR that was for 05/24/18. She recently had a [facility] shunt placed at [facility] and is unable to have any procedures for a while. Will call office when she is ready to proceed.  FYI to AB.

## 2018-05-17 NOTE — Telephone Encounter (Signed)
Endo scheduler called office, pt was no show for pre-op appt. She is scheduled for TCS/EGD/DIL w/Propofol w/RMR 05/24/18. Procedure is for rectal bleeding and dysphagia. Tried to call pt to see if she wants to have procedure 05/24/18 or reschedule (COVID-19 precautions), no answer, LMOVM for return call.

## 2018-05-24 ENCOUNTER — Ambulatory Visit (HOSPITAL_COMMUNITY): Admission: RE | Admit: 2018-05-24 | Payer: Medicare Other | Source: Home / Self Care | Admitting: Internal Medicine

## 2018-05-24 ENCOUNTER — Encounter (HOSPITAL_COMMUNITY): Admission: RE | Payer: Self-pay | Source: Home / Self Care

## 2018-05-24 DIAGNOSIS — Z299 Encounter for prophylactic measures, unspecified: Secondary | ICD-10-CM | POA: Diagnosis not present

## 2018-05-24 DIAGNOSIS — R110 Nausea: Secondary | ICD-10-CM | POA: Diagnosis not present

## 2018-05-24 DIAGNOSIS — I10 Essential (primary) hypertension: Secondary | ICD-10-CM | POA: Diagnosis not present

## 2018-05-24 DIAGNOSIS — M797 Fibromyalgia: Secondary | ICD-10-CM | POA: Diagnosis not present

## 2018-05-24 DIAGNOSIS — Z713 Dietary counseling and surveillance: Secondary | ICD-10-CM | POA: Diagnosis not present

## 2018-05-24 SURGERY — COLONOSCOPY WITH PROPOFOL
Anesthesia: Monitor Anesthesia Care

## 2018-05-26 DIAGNOSIS — Z982 Presence of cerebrospinal fluid drainage device: Secondary | ICD-10-CM | POA: Diagnosis not present

## 2018-05-26 DIAGNOSIS — G932 Benign intracranial hypertension: Secondary | ICD-10-CM | POA: Diagnosis not present

## 2018-06-16 ENCOUNTER — Ambulatory Visit: Payer: Medicare Other | Admitting: Gastroenterology

## 2018-06-24 DIAGNOSIS — M62830 Muscle spasm of back: Secondary | ICD-10-CM | POA: Diagnosis not present

## 2018-06-24 DIAGNOSIS — I10 Essential (primary) hypertension: Secondary | ICD-10-CM | POA: Diagnosis not present

## 2018-06-24 DIAGNOSIS — Z299 Encounter for prophylactic measures, unspecified: Secondary | ICD-10-CM | POA: Diagnosis not present

## 2018-07-16 DIAGNOSIS — G932 Benign intracranial hypertension: Secondary | ICD-10-CM | POA: Diagnosis not present

## 2018-07-16 DIAGNOSIS — Z4541 Encounter for adjustment and management of cerebrospinal fluid drainage device: Secondary | ICD-10-CM | POA: Diagnosis not present

## 2018-08-23 DIAGNOSIS — Z6841 Body Mass Index (BMI) 40.0 and over, adult: Secondary | ICD-10-CM | POA: Diagnosis not present

## 2018-08-23 DIAGNOSIS — R609 Edema, unspecified: Secondary | ICD-10-CM | POA: Diagnosis not present

## 2018-08-23 DIAGNOSIS — I10 Essential (primary) hypertension: Secondary | ICD-10-CM | POA: Diagnosis not present

## 2018-08-23 DIAGNOSIS — M797 Fibromyalgia: Secondary | ICD-10-CM | POA: Diagnosis not present

## 2018-08-23 DIAGNOSIS — F410 Panic disorder [episodic paroxysmal anxiety] without agoraphobia: Secondary | ICD-10-CM | POA: Diagnosis not present

## 2018-08-23 DIAGNOSIS — Z299 Encounter for prophylactic measures, unspecified: Secondary | ICD-10-CM | POA: Diagnosis not present

## 2018-09-06 DIAGNOSIS — R0602 Shortness of breath: Secondary | ICD-10-CM | POA: Diagnosis not present

## 2018-09-06 DIAGNOSIS — R600 Localized edema: Secondary | ICD-10-CM | POA: Diagnosis not present

## 2018-10-05 DIAGNOSIS — H9209 Otalgia, unspecified ear: Secondary | ICD-10-CM | POA: Diagnosis not present

## 2018-10-05 DIAGNOSIS — J019 Acute sinusitis, unspecified: Secondary | ICD-10-CM | POA: Diagnosis not present

## 2018-10-05 DIAGNOSIS — Z299 Encounter for prophylactic measures, unspecified: Secondary | ICD-10-CM | POA: Diagnosis not present

## 2018-10-05 DIAGNOSIS — I10 Essential (primary) hypertension: Secondary | ICD-10-CM | POA: Diagnosis not present

## 2018-10-05 DIAGNOSIS — Z789 Other specified health status: Secondary | ICD-10-CM | POA: Diagnosis not present

## 2018-11-11 DIAGNOSIS — I10 Essential (primary) hypertension: Secondary | ICD-10-CM | POA: Diagnosis not present

## 2018-11-11 DIAGNOSIS — M797 Fibromyalgia: Secondary | ICD-10-CM | POA: Diagnosis not present

## 2018-11-11 DIAGNOSIS — Z299 Encounter for prophylactic measures, unspecified: Secondary | ICD-10-CM | POA: Diagnosis not present

## 2018-11-11 DIAGNOSIS — Z6841 Body Mass Index (BMI) 40.0 and over, adult: Secondary | ICD-10-CM | POA: Diagnosis not present

## 2018-11-11 DIAGNOSIS — J029 Acute pharyngitis, unspecified: Secondary | ICD-10-CM | POA: Diagnosis not present

## 2018-11-17 DIAGNOSIS — Z6841 Body Mass Index (BMI) 40.0 and over, adult: Secondary | ICD-10-CM | POA: Diagnosis not present

## 2018-11-17 DIAGNOSIS — I10 Essential (primary) hypertension: Secondary | ICD-10-CM | POA: Diagnosis not present

## 2018-11-17 DIAGNOSIS — Z299 Encounter for prophylactic measures, unspecified: Secondary | ICD-10-CM | POA: Diagnosis not present

## 2018-11-17 DIAGNOSIS — M797 Fibromyalgia: Secondary | ICD-10-CM | POA: Diagnosis not present

## 2018-11-17 DIAGNOSIS — J029 Acute pharyngitis, unspecified: Secondary | ICD-10-CM | POA: Diagnosis not present

## 2018-11-17 DIAGNOSIS — J329 Chronic sinusitis, unspecified: Secondary | ICD-10-CM | POA: Diagnosis not present

## 2018-11-22 DIAGNOSIS — R0602 Shortness of breath: Secondary | ICD-10-CM | POA: Diagnosis not present

## 2018-11-22 DIAGNOSIS — R079 Chest pain, unspecified: Secondary | ICD-10-CM | POA: Diagnosis not present

## 2018-11-22 DIAGNOSIS — R072 Precordial pain: Secondary | ICD-10-CM | POA: Diagnosis not present

## 2018-11-22 DIAGNOSIS — R000 Tachycardia, unspecified: Secondary | ICD-10-CM | POA: Diagnosis not present

## 2018-11-22 DIAGNOSIS — M797 Fibromyalgia: Secondary | ICD-10-CM | POA: Diagnosis not present

## 2018-11-22 DIAGNOSIS — Z885 Allergy status to narcotic agent status: Secondary | ICD-10-CM | POA: Diagnosis not present

## 2018-11-22 DIAGNOSIS — R0600 Dyspnea, unspecified: Secondary | ICD-10-CM | POA: Diagnosis not present

## 2018-11-22 DIAGNOSIS — F419 Anxiety disorder, unspecified: Secondary | ICD-10-CM | POA: Diagnosis not present

## 2018-11-22 DIAGNOSIS — Z888 Allergy status to other drugs, medicaments and biological substances status: Secondary | ICD-10-CM | POA: Diagnosis not present

## 2018-11-22 DIAGNOSIS — Z880 Allergy status to penicillin: Secondary | ICD-10-CM | POA: Diagnosis not present

## 2018-11-22 DIAGNOSIS — M1990 Unspecified osteoarthritis, unspecified site: Secondary | ICD-10-CM | POA: Diagnosis not present

## 2018-11-22 DIAGNOSIS — I10 Essential (primary) hypertension: Secondary | ICD-10-CM | POA: Diagnosis not present

## 2018-11-22 DIAGNOSIS — Z79899 Other long term (current) drug therapy: Secondary | ICD-10-CM | POA: Diagnosis not present

## 2018-11-22 DIAGNOSIS — F329 Major depressive disorder, single episode, unspecified: Secondary | ICD-10-CM | POA: Diagnosis not present

## 2018-11-26 ENCOUNTER — Encounter: Payer: Self-pay | Admitting: *Deleted

## 2018-11-26 ENCOUNTER — Encounter: Payer: Self-pay | Admitting: Cardiology

## 2018-11-26 ENCOUNTER — Ambulatory Visit (INDEPENDENT_AMBULATORY_CARE_PROVIDER_SITE_OTHER): Payer: Medicare Other | Admitting: Cardiology

## 2018-11-26 ENCOUNTER — Other Ambulatory Visit: Payer: Self-pay

## 2018-11-26 VITALS — BP 138/80 | HR 103 | Ht 66.0 in | Wt 286.0 lb

## 2018-11-26 DIAGNOSIS — I498 Other specified cardiac arrhythmias: Secondary | ICD-10-CM

## 2018-11-26 DIAGNOSIS — R0602 Shortness of breath: Secondary | ICD-10-CM

## 2018-11-26 DIAGNOSIS — G90A Postural orthostatic tachycardia syndrome (POTS): Secondary | ICD-10-CM

## 2018-11-26 MED ORDER — ATENOLOL 25 MG PO TABS: 12.5000 mg | ORAL_TABLET | Freq: Every day | ORAL | 6 refills | Status: DC

## 2018-11-26 NOTE — Progress Notes (Signed)
Cardiology Office Note  Date: 11/26/2018   ID: Sandra [name], DOB 1975-06-11, MRN [id number]  PCP:  Glenda Chroman, MD  Consulting Cardiologist: Satira Sark, MD Electrophysiologist:  None   Chief Complaint  Patient presents with  . Shortness of Breath    History of Present Illness: Sandra [name] is a 43 y.o. female referred for cardiology consultation by Dr. Woody Seller. Unfortunately, I received no records for review but was able to locate some information in the electronic chart.  She states that she has been short of breath with activity over the last several months.  She reports longstanding trouble with relative tachycardia in the setting of POTS. Record review finds previous evaluation by Dr. Dannielle Burn and Dr. Aundra Dubin as of 2011.  She was diagnosed with dysautonomia, possibly POTS and was treated with atenolol, although it does not look like she has been on any specific medication for at least a few years.  She does report feeling better when she was on beta-blocker.  She was seen in the ER at [facility] just a few days ago reporting chest discomfort, troponin T level was normal.  I am requesting additional records.  She also tells me that she had an echocardiogram done through [facility] within the last few months..  Previous cardiac work-up includes a cardiac catheterization in 2007 that showed normal coronary arteries.  She follows with Neurosurgery at [facility] with history of left ventricular-atrial shunt for management of idiopathic intracranial hypertension in March of this year.  I personally reviewed her ECG today which shows normal sinus rhythm at 89 bpm.  Orthostatic vital signs today are suggestive of POTS with increase in heart rate from 85 bpm up to 116 bpm and persisting, no significant change in blood pressure.   Past Medical History:  Diagnosis Date  . Anxiety   . Arthritis   . Chronic back pain   . Chronic migraine   . Constipation   . Depression    . Fibromyalgia   . GERD (gastroesophageal reflux disease)   . History of bronchitis   . History of cardiac catheterization    Normal coronaries 2007  . History of gastric ulcer   . History of hiatal hernia   . History of pneumonia   . Hypertension   . Idiopathic intracranial hypertension   . Orthostatic hypotension   . POTS (postural orthostatic tachycardia syndrome)   . Seasonal allergies     Past Surgical History:  Procedure Laterality Date  . ABDOMINAL HYSTERECTOMY    . BACK SURGERY    . BLADDER SURGERY     bladder tact  . BREAST SURGERY Left    Lumpectomy  . CARDIAC CATHETERIZATION  2010  . CHOLECYSTECTOMY    . COLONOSCOPY  2003   moderate external hemorrhoids with normal TI  . CYSTOSCOPY W/ URETERAL STENT REMOVAL      stent placed and removed  . ESOPHAGEAL DILATION     4-5 times  . ESOPHAGOGASTRODUODENOSCOPY  2003   small sliding hiatal hernia, erosive gastritis  . ESOPHAGOGASTRODUODENOSCOPY  2004   erosive reflux esophagitis with Schatzki ring s/p dilation, erosive antral gastritis  . ESOPHAGOGASTRODUODENOSCOPY  2006   non-critical Schatzki ring proximal to GEJ, s/p 54 and 56 Maloney dilatation. ?Candida esophagitis  . ESOPHAGOGASTRODUODENOSCOPY  2009   small hiatal hernia  . LUMBAR LAMINECTOMY/DECOMPRESSION MICRODISCECTOMY Left 01/11/2014   Procedure: LUMBAR LAMINECTOMY/DECOMPRESSION MICRODISCECTOMY 1 LEVEL LEFT LUMBAR TWO/THREE;  Surgeon: Newman Pies, MD;  Location: [facility];  Service: Neurosurgery;  Laterality: Left;  . TUBAL LIGATION    . VENTRICULOPERITONEAL SHUNT Right 11/19/2015   Procedure: VENTRICULAR-PERITONEAL SHUNT RIGHT;  Surgeon: Newman Pies, MD;  Location: [place] [facility] [facility];  Service: Neurosurgery;  Laterality: Right;  Right    Current Outpatient Medications  Medication Sig Dispense Refill  . butalbital-acetaminophen-caffeine (FIORICET, ESGIC) 50-325-40 MG tablet Take 1 tablet by mouth every 6 (six) hours as needed for headache. 20 tablet  0  . cetirizine (ZYRTEC) 10 MG tablet Take 10 mg by mouth daily.     . diazepam (VALIUM) 5 MG tablet Take 5 mg by mouth every 6 (six) hours as needed for anxiety.    . diphenhydrAMINE (BENADRYL) 25 mg capsule Take 25 mg by mouth every 6 (six) hours as needed for itching.    . hydrochlorothiazide (HYDRODIURIL) 12.5 MG tablet Take 12.5 mg by mouth daily.   1  . ibuprofen (ADVIL,MOTRIN) 100 MG/5ML suspension Take 300 mg by mouth every 4 (four) hours as needed for fever or mild pain.    . Linaclotide (LINZESS) 145 MCG CAPS capsule Take 145 mcg by mouth daily as needed (for constipation).    . meclizine (ANTIVERT) 25 MG tablet Take 1 tablet (25 mg total) by mouth 3 (three) times daily as needed for dizziness. 30 tablet 1  . Na Sulfate-K Sulfate-Mg Sulf (SUPREP BOWEL PREP KIT) 17.5-3.13-1.6 GM/177ML SOLN Take 1 kit by mouth as directed. 1 Bottle 0  . pantoprazole (PROTONIX) 40 MG tablet Take 1 tablet (40 mg total) by mouth daily. Take 30 minutes before breakfast daily 90 tablet 3  . pregabalin (LYRICA) 300 MG capsule Take 300 mg by mouth at bedtime.     . rizatriptan (MAXALT-MLT) 5 MG disintegrating tablet Take 5 mg by mouth daily as needed for migraine. May repeat in 2 hours if needed    . tetrahydrozoline (VISINE) 0.05 % ophthalmic solution Place 1 drop into both eyes as needed (For allergies).    Marland Kitchen atenolol (TENORMIN) 25 MG tablet Take 0.5 tablets (12.5 mg total) by mouth daily. 15 tablet 6   No current facility-administered medications for this visit.    Allergies:  Ambien [zolpidem tartrate], Penicillins, Topamax [topiramate], Chlorhexidine gluconate, Hydrocodone-acetaminophen, Latex, and Versed [midazolam]   Social History: The patient  reports that she has never smoked. She has never used smokeless tobacco. She reports that she does not drink alcohol or use drugs.   Family History: The patient's family history includes Aortic aneurysm in an other family member; Coronary artery disease in her  father; Diabetes in her mother; Hypertension in her father and mother; Kidney cancer in her mother; Lung cancer in her paternal grandmother; Pancreatic cancer in her maternal grandmother; Prostate cancer in her maternal grandfather; Thyroid disease in her mother; Ulcers in her father.   ROS:  Please see the history of present illness. Otherwise, complete review of systems is positive for intermittent fatigue.  All other systems are reviewed and negative.   Physical Exam: VS:  BP 138/80   Pulse (!) 103   Ht '5\' 6"'  (1.676 m)   Wt 286 lb (129.7 kg)   SpO2 98%   BMI 46.16 kg/m , BMI Body mass index is 46.16 kg/m.  Wt Readings from Last 3 Encounters:  11/26/18 286 lb (129.7 kg)  03/17/18 274 lb 3.2 oz (124.4 kg)  07/15/17 270 lb 3.2 oz (122.6 kg)    General: Morbidly obese woman, appears comfortable at rest. HEENT: Conjunctiva and lids normal, wearing a mask. Neck: Supple,  no elevated JVP or carotid bruits, no thyromegaly. Lungs: Clear to auscultation, nonlabored breathing at rest. Cardiac: Regular rate and rhythm, no S3, soft systolic murmur, no pericardial rub. Abdomen: Soft, nontender, bowel sounds present, no guarding or rebound. Extremities: Mild ankle and hand edema, distal pulses 2+. Skin: Warm and dry. Musculoskeletal: No kyphosis. Neuropsychiatric: Alert and oriented x3, affect grossly appropriate.  ECG:  An ECG dated 07/15/2017 was personally reviewed today and demonstrated:  Sinus tachycardia with low voltage and nonspecific T wave changes.  Recent Labwork:  820: Potassium 4.4, BUN 13, creatinine 0.97, AST 13, ALT 11, hemoglobin 14.2, platelets 180  Other Studies Reviewed Today:  Echocardiogram 04/04/2015: Study Conclusions  - Left ventricle: The cavity size was normal. Wall thickness was   normal. Systolic function was normal. The estimated ejection   fraction was in the range of 60% to 65%. Wall motion was normal;   there were no regional wall motion abnormalities.  Left   ventricular diastolic function parameters were normal. - Tricuspid valve: There was mild regurgitation. - Pulmonary arteries: PA peak pressure: 27 mm Hg (S). - Systemic veins: Dilated IVC with normal respiratory variation. - CVP 8 mmHg.  Assessment and Plan:  1.  Dyspnea on exertion in the setting of relative tachycardia with suspected POTS based on previous work-up and also orthostatic vital signs today.  Records indicate that she did reasonably well on atenolol in the past, will resume at 12.5 mg daily and titrate as able.  Also requesting records from Dr. Woody Seller regarding her echocardiogram and also the recent ER visit.  2.  Normal coronary arteries by cardiac catheterization in 2007.  3.  Idiopathic intracranial hypertension status post ventricular atrial shunt with follow-up by neurosurgery at [facility].  Medication Adjustments/Labs and Tests Ordered: Current medicines are reviewed at length with the patient today.  Concerns regarding medicines are outlined above.   Tests Ordered: Orders Placed This Encounter  Procedures  . EKG 12-Lead    Medication Changes: Meds ordered this encounter  Medications  . atenolol (TENORMIN) 25 MG tablet    Sig: Take 0.5 tablets (12.5 mg total) by mouth daily.    Dispense:  15 tablet    Refill:  6    New 11/26/2018.    Disposition:  Follow up 4 to 6 weeks.  Signed, Satira Sark, MD, [facility] 11/26/2018 3:32 PM    [facility] [place], [place], [place] [address] Phone: [phone]; Fax: [phone]

## 2018-11-26 NOTE — Patient Instructions (Signed)
Medication Instructions:   Begin Atenolol 12.5mg  daily.   Continue all other medications.    Labwork: none  Testing/Procedures: none  Follow-Up: 8-10 weeks   Any Other Special Instructions Will Be Listed Below (If Applicable).  If you need a refill on your cardiac medications before your next appointment, please call your pharmacy.

## 2018-12-02 DIAGNOSIS — E7800 Pure hypercholesterolemia, unspecified: Secondary | ICD-10-CM | POA: Diagnosis not present

## 2018-12-02 DIAGNOSIS — K219 Gastro-esophageal reflux disease without esophagitis: Secondary | ICD-10-CM | POA: Diagnosis not present

## 2018-12-02 DIAGNOSIS — R05 Cough: Secondary | ICD-10-CM | POA: Diagnosis not present

## 2018-12-02 DIAGNOSIS — Z6841 Body Mass Index (BMI) 40.0 and over, adult: Secondary | ICD-10-CM | POA: Diagnosis not present

## 2018-12-02 DIAGNOSIS — J069 Acute upper respiratory infection, unspecified: Secondary | ICD-10-CM | POA: Diagnosis not present

## 2018-12-02 DIAGNOSIS — Z299 Encounter for prophylactic measures, unspecified: Secondary | ICD-10-CM | POA: Diagnosis not present

## 2018-12-07 DIAGNOSIS — Z982 Presence of cerebrospinal fluid drainage device: Secondary | ICD-10-CM | POA: Diagnosis not present

## 2018-12-07 DIAGNOSIS — E669 Obesity, unspecified: Secondary | ICD-10-CM | POA: Diagnosis not present

## 2018-12-07 DIAGNOSIS — Z6841 Body Mass Index (BMI) 40.0 and over, adult: Secondary | ICD-10-CM | POA: Diagnosis not present

## 2018-12-07 DIAGNOSIS — R519 Headache, unspecified: Secondary | ICD-10-CM | POA: Diagnosis not present

## 2018-12-07 DIAGNOSIS — R1011 Right upper quadrant pain: Secondary | ICD-10-CM | POA: Diagnosis not present

## 2018-12-07 DIAGNOSIS — R1084 Generalized abdominal pain: Secondary | ICD-10-CM | POA: Diagnosis not present

## 2018-12-07 DIAGNOSIS — R109 Unspecified abdominal pain: Secondary | ICD-10-CM | POA: Diagnosis not present

## 2018-12-07 DIAGNOSIS — R0602 Shortness of breath: Secondary | ICD-10-CM | POA: Diagnosis not present

## 2018-12-07 DIAGNOSIS — G932 Benign intracranial hypertension: Secondary | ICD-10-CM | POA: Diagnosis not present

## 2018-12-07 DIAGNOSIS — R9431 Abnormal electrocardiogram [ECG] [EKG]: Secondary | ICD-10-CM | POA: Diagnosis not present

## 2018-12-07 DIAGNOSIS — Z299 Encounter for prophylactic measures, unspecified: Secondary | ICD-10-CM | POA: Diagnosis not present

## 2018-12-07 DIAGNOSIS — R140 Abdominal distension (gaseous): Secondary | ICD-10-CM | POA: Diagnosis not present

## 2018-12-07 DIAGNOSIS — I10 Essential (primary) hypertension: Secondary | ICD-10-CM | POA: Diagnosis not present

## 2018-12-07 DIAGNOSIS — R1013 Epigastric pain: Secondary | ICD-10-CM | POA: Diagnosis not present

## 2018-12-07 DIAGNOSIS — Z713 Dietary counseling and surveillance: Secondary | ICD-10-CM | POA: Diagnosis not present

## 2018-12-08 DIAGNOSIS — Z6841 Body Mass Index (BMI) 40.0 and over, adult: Secondary | ICD-10-CM | POA: Diagnosis not present

## 2018-12-08 DIAGNOSIS — Z299 Encounter for prophylactic measures, unspecified: Secondary | ICD-10-CM | POA: Diagnosis not present

## 2018-12-08 DIAGNOSIS — R140 Abdominal distension (gaseous): Secondary | ICD-10-CM | POA: Diagnosis not present

## 2018-12-08 DIAGNOSIS — I498 Other specified cardiac arrhythmias: Secondary | ICD-10-CM | POA: Diagnosis not present

## 2018-12-08 DIAGNOSIS — K219 Gastro-esophageal reflux disease without esophagitis: Secondary | ICD-10-CM | POA: Diagnosis not present

## 2018-12-08 DIAGNOSIS — E7800 Pure hypercholesterolemia, unspecified: Secondary | ICD-10-CM | POA: Diagnosis not present

## 2018-12-18 NOTE — Progress Notes (Signed)
Referring Provider: Ignatius Specking, MD Primary Care Physician:  Ignatius Specking, MD Primary GI Physician: Dr. Jena Gauss  Chief Complaint  Patient presents with  . Abdominal Pain    with swelling. reports had 22lb weight gain in 1 week. Seen at [facility].  . Consult    TCS    HPI:   Sandra [name] is a 42 y.o. female with a history significant for idiopathic intracranial hypertension followed by [facility] with recent [facility] shunt placed on 05/06/2018. Other chronic medical issues noted in PMH. She presents today for for follow-up of abdominal pain, constipation, and to reschedule her TCS and EGD.    Prior GI evaluation:  EGD, 10/09-->small hiatal hernia EGD, 2006-->noncritical Schatzki's ring proximal to GEJ, s/p 54 and 2F Maloney dilation.?Candida esophagitis,negative KOH.  EGD 2004: ERE with Schatzki's ring s/p dilation, erosive antral gastritis TCS, 2003-->moderate external hemorrhoids with normal TI EGD 2003: small sliding hiatal hernia, erosive gastritis, s/p dilation with 56 French Maloney dilator.   She was last seen in our office on 03/17/2018.  At that time she reported intermittent rectal bleeding, intermittent constipation taking Linzess 145 as needed, GERD, LUQ pain, and dysphagia.  Plans to pursue TCS for rectal bleeding and EGD with dilation for dysphagia.  Linzess was decreased to 72 mcg daily with samples provided and requesting a progress report.  Started on Protonix 40 mg daily.   Patient called to cancel her procedures due to [facility] shunt placement in March. Shunt reprogrammed on 07/16/18. Patient saw cardiology in consultation on 11/26/2018 for further evaluation of shortness of breath with activity and history of POTS.  Patient had presented to [facility] few days earlier with chest discomfort, troponin levels normal.  Plans to resume atenolol for POTS, request records, and follow-up in 4 to 6 weeks.  She is also recently seen in the emergency department at [facility]  health for abdominal distention and headache on 12/07/2018.  Shunt series x-rays with no evidence of VA shunt discontinuity.  CT head without contrast without any acute findings.  CT abdomen and pelvis with contrast with no acute abnormality involving the chest, abdomen, or pelvis.  Hydronephrosis was noted.  Labs with CMP normal, CBC essentially normal, lipase normal, and BNP normal.  Today she states about 2 weeks ago she had gained about 22 lbs in 1 week. Feels her abdomen is distended. Thinks she has lost some of the weight. States she is swelling in her face, hands, and feet. In review of documented weights, she gained 12 pounds between 03/17/18-11/26/18.  She has lost 1 pound since 11/26/2018.  Feeling short of breath. Shortness of breath has worsened over the last 2-3 weeks. Was on antibiotics and prednisone for 3 weeks for sinus infection and strep throat. Last dose 2 weeks ago. Some shortness of breath at night. Shortness of breath with minimal exertion. Productive cough with green and yellow phlegm. No fever. Feels fatigued. Some weakness. Reports she is urinating normally. Urine is yellow. Intermittent nausea, this is chronic. No vomiting. Seeing PCP on Friday.   Reports bright red blood with most BMs. Blood in stool, in toilet, and on tissue. Reports history of hemorrhoids. Occasional rectal itching. Not using anything for hemorrhoids at this time. Taking 1 colace daily. Took Linzess twice 2 weeks ago. Causes a lot of diarrhea. Feels like she is constipated otherwise. Will go 1-2 weeks without a BM. Stools are hard with straining. Feels a lot of pressure in her vagina when having a  BM. Feels significantly bloated. Bloating improves some when she has a good BM. No black stools.   Pain in epigastric area and extends down under left ribs toward her back. Started a couple weeks ago. Mild improvement with BM. Food continues to get stuck in her throat at times. Usually solid foods. Will occur with liquids  at times. Hangs at the sternal notch. Present for over 1 year. Occurs a few times a week.   Continues to have some breakthough burning from epigastric area up through chest. Present a few times a week. Improved some with Protonix. Epigastric burning after eating. No NSAIDs.     Past Medical History:  Diagnosis Date  . Anxiety   . Arthritis   . Chronic back pain   . Chronic migraine   . Constipation   . Depression   . Fibromyalgia   . GERD (gastroesophageal reflux disease)   . History of bronchitis   . History of cardiac catheterization    Normal coronaries 2007  . History of gastric ulcer   . History of hiatal hernia   . History of pneumonia   . Hypertension   . Idiopathic intracranial hypertension   . Orthostatic hypotension   . POTS (postural orthostatic tachycardia syndrome)   . Seasonal allergies     Past Surgical History:  Procedure Laterality Date  . ABDOMINAL HYSTERECTOMY    . BACK SURGERY    . BLADDER SURGERY     bladder tact  . BREAST SURGERY Left    Lumpectomy  . CARDIAC CATHETERIZATION  2010  . CHOLECYSTECTOMY    . COLONOSCOPY  2003   moderate external hemorrhoids with normal TI  . CYSTOSCOPY W/ URETERAL STENT REMOVAL      stent placed and removed  . ESOPHAGEAL DILATION     4-5 times  . ESOPHAGOGASTRODUODENOSCOPY  2003   small sliding hiatal hernia, erosive gastritis  . ESOPHAGOGASTRODUODENOSCOPY  2004   erosive reflux esophagitis with Schatzki ring s/p dilation, erosive antral gastritis  . ESOPHAGOGASTRODUODENOSCOPY  2006   non-critical Schatzki ring proximal to GEJ, s/p 54 and 56 Maloney dilatation. ?Candida esophagitis  . ESOPHAGOGASTRODUODENOSCOPY  2009   small hiatal hernia  . LUMBAR LAMINECTOMY/DECOMPRESSION MICRODISCECTOMY Left 01/11/2014   Procedure: LUMBAR LAMINECTOMY/DECOMPRESSION MICRODISCECTOMY 1 LEVEL LEFT LUMBAR TWO/THREE;  Surgeon: Tressie Stalker, MD;  Location: [facility] NEUR[facility]S;  Service: Neurosurgery;  Laterality: Left;  . TUBAL LIGATION     . ventrical atrium shunt  04/2018  . VENTRICULOPERITONEAL SHUNT Right 11/19/2015   Procedure: VENTRICULAR-PERITONEAL SHUNT RIGHT;  Surgeon: Tressie Stalker, MD;  Location: [facility] NEURO ORS;  Service: Neurosurgery;  Laterality: Right;  Right    Current Outpatient Medications  Medication Sig Dispense Refill  . atenolol (TENORMIN) 25 MG tablet Take 0.5 tablets (12.5 mg total) by mouth daily. 15 tablet 6  . butalbital-acetaminophen-caffeine (FIORICET, ESGIC) 50-325-40 MG tablet Take 1 tablet by mouth every 6 (six) hours as needed for headache. 20 tablet 0  . cetirizine (ZYRTEC) 10 MG tablet Take 10 mg by mouth daily.     . diazepam (VALIUM) 5 MG tablet Take 5 mg by mouth every 6 (six) hours as needed for anxiety.    . diphenhydrAMINE (BENADRYL) 25 mg capsule Take 25 mg by mouth every 6 (six) hours as needed for itching.    . hydrochlorothiazide (HYDRODIURIL) 12.5 MG tablet Take 12.5 mg by mouth daily.   1  . ibuprofen (ADVIL,MOTRIN) 100 MG/5ML suspension Take 300 mg by mouth every 4 (four) hours as needed for  fever or mild pain.    . meclizine (ANTIVERT) 25 MG tablet Take 1 tablet (25 mg total) by mouth 3 (three) times daily as needed for dizziness. 30 tablet 1  . pregabalin (LYRICA) 300 MG capsule Take 150 mg by mouth at bedtime.     . rizatriptan (MAXALT-MLT) 5 MG disintegrating tablet Take 5 mg by mouth daily as needed for migraine. May repeat in 2 hours if needed    . tetrahydrozoline (VISINE) 0.05 % ophthalmic solution Place 1 drop into both eyes as needed (For allergies).    . lubiprostone (AMITIZA) 8 MCG capsule Take 1 capsule (8 mcg total) by mouth 2 (two) times daily with a meal. 60 capsule 5  . pantoprazole (PROTONIX) 40 MG tablet Take 1 tablet (40 mg total) by mouth 2 (two) times daily. 60 tablet 5   No current facility-administered medications for this visit.     Allergies as of 12/20/2018 - Review Complete 12/20/2018  Allergen Reaction Noted  . Ambien [zolpidem tartrate] Rash and  Other (See Comments) 01/27/2013  . Penicillins Hives and Swelling 01/05/2014  . Topamax [topiramate] Other (See Comments) 01/10/2014  . Chlorhexidine gluconate Itching and Rash 11/19/2015  . Hydrocodone-acetaminophen Itching   . Latex Rash 04/04/2015  . Versed [midazolam] Itching 02/03/2014    Family History  Problem Relation Age of Onset  . Kidney cancer Mother   . Diabetes Mother   . Hypertension Mother   . Thyroid disease Mother   . Ulcers Father   . Coronary artery disease Father        Premature CAD  . Hypertension Father   . Prostate cancer Maternal Grandfather   . Pancreatic cancer Maternal Grandmother   . Lung cancer Paternal Grandmother   . Aortic aneurysm Other   . Colon cancer Neg Hx     Social History   Socioeconomic History  . Marital status: Married    Spouse name: Jobey  . Number of children: 2  . Years of education: 86  . Highest education level: Not on file  Occupational History    Comment: [employer]  Social Needs  . Financial resource strain: Not on file  . Food insecurity    Worry: Not on file    Inability: Not on file  . Transportation needs    Medical: Not on file    Non-medical: Not on file  Tobacco Use  . Smoking status: Never Smoker  . Smokeless tobacco: Never Used  Substance and Sexual Activity  . Alcohol use: No    Alcohol/week: 0.0 standard drinks  . Drug use: No  . Sexual activity: Not on file  Lifestyle  . Physical activity    Days per week: Not on file    Minutes per session: Not on file  . Stress: Not on file  Relationships  . Social Herbalist on phone: Not on file    Gets together: Not on file    Attends religious service: Not on file    Active member of club or organization: Not on file    Attends meetings of clubs or organizations: Not on file    Relationship status: Not on file  Other Topics Concern  . Not on file  Social History Narrative   Patient lives at home with her children and she is se  prated.   Patient is out of work at this time.   Education one year of college.   Caffeine one cup of sweet daily.  Review of Systems: Gen: Denies fever, chills, lightheadedness, dizziness or feeling she will pass out. CV: Denies chest pain, palpitations Resp: See HPI GI: See HPI Derm: Denies rash Heme: See HPI  Physical Exam: BP (!) 147/81   Pulse 91   Temp (!) 97.4 F (36.3 C) (Oral)   Ht  (1.676 m)   Wt 285 lb 9.6 oz (129.5 kg)   BMI 46.10 kg/m  General:   Alert and oriented. No distress noted. Pleasant and cooperative.  Head:  Normocephalic and atraumatic. Eyes:  Conjuctiva clear without scleral icterus. Heart:  S1, S2 present without murmurs appreciated. Lungs:  Clear to auscultation bilaterally. No wheezes, rales, or rhonchi. No distress.  Abdomen:  +BS, soft, and non-distended.  Mild tenderness to palpation in the epigastric and left upper quadrant. No rebound or guarding. No HSM or masses noted. Msk:  Symmetrical without gross deformities. Normal posture. Extremities:  Without edema. Neurologic:  Alert and  oriented x4 Psych: Normal mood and affect.

## 2018-12-20 ENCOUNTER — Telehealth: Payer: Self-pay | Admitting: *Deleted

## 2018-12-20 ENCOUNTER — Other Ambulatory Visit: Payer: Self-pay

## 2018-12-20 ENCOUNTER — Telehealth: Payer: Self-pay | Admitting: Internal Medicine

## 2018-12-20 ENCOUNTER — Ambulatory Visit (INDEPENDENT_AMBULATORY_CARE_PROVIDER_SITE_OTHER): Payer: Medicare Other | Admitting: Gastroenterology

## 2018-12-20 ENCOUNTER — Encounter: Payer: Self-pay | Admitting: Gastroenterology

## 2018-12-20 VITALS — BP 147/81 | HR 91 | Temp 97.4°F | Ht 66.0 in | Wt 285.6 lb

## 2018-12-20 DIAGNOSIS — K5900 Constipation, unspecified: Secondary | ICD-10-CM

## 2018-12-20 DIAGNOSIS — R1310 Dysphagia, unspecified: Secondary | ICD-10-CM | POA: Diagnosis not present

## 2018-12-20 DIAGNOSIS — K219 Gastro-esophageal reflux disease without esophagitis: Secondary | ICD-10-CM

## 2018-12-20 DIAGNOSIS — K625 Hemorrhage of anus and rectum: Secondary | ICD-10-CM

## 2018-12-20 MED ORDER — LUBIPROSTONE 8 MCG PO CAPS: 8.0000 ug | ORAL_CAPSULE | Freq: Two times a day (BID) | ORAL | 5 refills | Status: AC

## 2018-12-20 MED ORDER — PANTOPRAZOLE SODIUM 40 MG PO TBEC: 40.0000 mg | DELAYED_RELEASE_TABLET | Freq: Two times a day (BID) | ORAL | 5 refills | Status: AC

## 2018-12-20 NOTE — Telephone Encounter (Signed)
[phone] Pt following up on a prior auth on a medication.  Pt says that [facility] told her that one was required.  Pt seen today.  She wants to make sure that we are aware of this information.

## 2018-12-20 NOTE — Telephone Encounter (Signed)
PA submitted. Waiting on an approval or denial.  

## 2018-12-20 NOTE — Patient Instructions (Addendum)
We will hold a spot for a colonoscopy and upper endoscopy with dilation of your esophagus with Dr. Gala Romney.  I would like to ensure your respiratory status improves prior to scheduling your procedure.  Please keep [place] updated after you see your primary care this Friday.  I am requesting your nursing staff to call you in 4 weeks to check on you.   Please start taking Protonix 40 mg twice daily.  You should take this 30 minutes before breakfast and 30 minutes before dinner.  Please follow a GERD diet.  Avoid spicy, fatty, fried, greasy, and citrus foods.  Avoid caffeine, carbonated beverages, and chocolate.  You should not eat within 3 hours of laying down.  You should eat 4-6 small meals daily.  You should prop the head of your bed up on bricks or wood to create a 6 inch incline.  See GERD handout below.  Please start taking Amitiza 8 mcg twice daily with a meal.  This is to help with constipation.  Is important that you take this every day.  This should help with your abdominal bloating as well.  Follow-up with your PCP regarding your shortness of breath, upper respiratory symptoms, generalized swelling, and hydronephrosis noted on recent CT.  Follow-up in 2 months.  Call if you have questions or concerns prior.  Aliene Altes, PA-C [facility]

## 2018-12-20 NOTE — Telephone Encounter (Signed)
Noted. Spoke with pt. Will work [place] for pt.

## 2018-12-20 NOTE — Telephone Encounter (Signed)
Pt returning call to AM. I told her AM was on another call. She said to let her know her [employer] member ID was [id number] and plan # was [phone]) [id number]. Any questions she can be reached at [phone]

## 2018-12-21 DIAGNOSIS — J329 Chronic sinusitis, unspecified: Secondary | ICD-10-CM | POA: Diagnosis not present

## 2018-12-21 DIAGNOSIS — Z299 Encounter for prophylactic measures, unspecified: Secondary | ICD-10-CM | POA: Diagnosis not present

## 2018-12-21 DIAGNOSIS — N1330 Unspecified hydronephrosis: Secondary | ICD-10-CM | POA: Diagnosis not present

## 2018-12-21 DIAGNOSIS — Z6841 Body Mass Index (BMI) 40.0 and over, adult: Secondary | ICD-10-CM | POA: Diagnosis not present

## 2018-12-21 DIAGNOSIS — J310 Chronic rhinitis: Secondary | ICD-10-CM | POA: Diagnosis not present

## 2018-12-21 DIAGNOSIS — B370 Candidal stomatitis: Secondary | ICD-10-CM | POA: Diagnosis not present

## 2018-12-21 NOTE — Telephone Encounter (Signed)
PA was denied for Amitiza. Pt has tried and failed Linzess and Miralax. Formulary alternatives are Linzess, Relistor, Movantik and Lactulose. Please advise.

## 2018-12-22 NOTE — Telephone Encounter (Signed)
Has patient tried Linzess 72 mcg in the past?

## 2018-12-23 NOTE — Telephone Encounter (Signed)
Lmom, waiting on a return call.  

## 2018-12-23 NOTE — Telephone Encounter (Signed)
Pt returned call. Pt has tried Linzess all strengths. Pt isn't able to know when she has to have a bowel movement when taking Linzess. Her bowels move without notice on Linzess.

## 2018-12-23 NOTE — Telephone Encounter (Signed)
Noted. If not covered a PA can be submitted.

## 2018-12-23 NOTE — Telephone Encounter (Signed)
Ok great. Let me know what you find out regarding coverage. If we have samples of either of these medications, it would be good for her to try them first if possible.

## 2018-12-23 NOTE — Telephone Encounter (Signed)
Ok, I realize you did not list Trulance or Motegrity as being on her formulary. However, could we see if these would be covered? The options on her formulary are not ideal.

## 2018-12-24 NOTE — Assessment & Plan Note (Signed)
History of dilations in the past with Schatzki's rings noted.  She has had improvement historically.  Currently with uncontrolled GERD on Protonix once daily.  Suspect patient has developed Schatzki's ring, or other esophageal web, or stricture secondary to uncontrolled GERD.   She needs EGD with possible dilation in the near future with Dr. Gala Romney.  We will hold a spot on the schedule for now as patient has ongoing upper respiratory illness with shortness of breath and productive cough for which she follows up with PCP this Friday.  We will have nursing staff call in 4 weeks to ensure her respiratory status is improving and hopefully proceed with scheduling. Increase Protonix to twice daily. Follow-up in 2 months.

## 2018-12-24 NOTE — Assessment & Plan Note (Signed)
History of chronic constipation.  BMs every 1-2 weeks.  Has tried Linzess 145 mcg as well as Linzess 72 mcg which produces significant diarrhea.  Patient currently taking 1 Colace daily.  Admits to bright red blood per rectum with most BMs.  No blood is in the stool, toilet water, and on toilet tissue.  Reports history of hemorrhoids.  Also with abdominal bloating and reporting 22 pound weight gain in 1 week.  However in review of documented weights, patient has gained 12 pounds between January and October.  She was recently on steroids for 2 weeks due to respiratory illness which has not improved.  She is to see her PCP in follow-up this Friday.  She was also seen in the emergency department at [facility] on 12/07/2018 with VA shunt series x-rays, CT head, and CT abdomen and pelvis without acute findings to explain her reported abdominal distention.  On exam, her abdomen is obese but soft and without any significant distention.   We will try Amitiza 8 mcg twice daily with meals for constipation. Patient needs colonoscopy for further evaluation of rectal bleeding.  In light of her ongoing respiratory illness with shortness of breath and productive cough, will have our schedulers hold a spot for colonoscopy with propofol.  We will schedule pending improvement of her respiratory status.  The risks, benefits, and alternatives have been discussed in detail with patient. They have stated understanding and desire to proceed.  Follow-up in 2 months.

## 2018-12-24 NOTE — Assessment & Plan Note (Addendum)
History of chronic GERD.  She was placed on Protonix at her last visit in January 2020.  She has had some improvement of GERD symptoms but still not well controlled.  She continues with breakthrough burning in the epigastric area up through the chest a few times a week, chronic intermittent nausea, epigastric burning after eating, and epigastric pain extending down under her left ribs towards her back for the last couple of weeks.  Also with dysphagia typically to solid foods.  She has had noted Schatzki's rings in the past that have required dilation.  Last EGD in 2009 with small hiatal hernia.  Denies NSAID use.  Denies melena.  She does have bright red blood per rectum with bowel movements in the setting of constipation as described below.  Recent hemoglobin on 12/07/2018 within normal limits.  Patient needs EGD with possible dilation with propofol in the near future with Dr. Gala Romney.  We will hold a spot on the schedule for now as patient has ongoing upper respiratory illness with shortness of breath and productive cough for which she follows up with PCP this Friday.  We will have nursing staff call in 4 weeks to ensure her respiratory status is improving and hopefully proceed with scheduling. Increase Protonix 40 mg to twice daily. Reinforced GERD diet.  Advised to avoid spicy, fatty, fried, greasy, and citrus foods.  Avoid caffeine, carbonated beverages, and chocolate.  Do not eat within 3 hours of laying down.  Eat 4-6 small meals daily.  Prop head of your bed up on bricks or wood to create a 6 inch incline.  Follow-up in 2 months.

## 2018-12-24 NOTE — Assessment & Plan Note (Addendum)
Rectal bleeding in the setting of constipation that is not well controlled.  Bright red blood in the stool, in toilet water, and on toilet tissue with most BMs.  Patient having a BM every 1-2 weeks.  Has been on Linzess 145 mcg and Linzess 72 mcg which is too strong.  She is currently taking 1 Colace daily.  Recent hemoglobin on 12/07/18 normal at 12.8.  Last colonoscopy in 2003 with external hemorrhoids and normal terminal ileum.  Suspect rectal bleeding is likely benign and related to hemorrhoids in the setting of constipation; however, as it has been 7 years since her last colonoscopy, patient needs colonoscopy to exclude other sources of rectal bleeding including polyps and malignancy.  We will try Amitiza 8 mcg twice daily with meals. We will hold a spot for TCS with propofol with Dr. Gala Romney.  Patient currently has ongoing upper respiratory illness with associated shortness of breath and productive cough for which she is following up with PCP this Friday.  Having nursing staff to call in 4 weeks to check on patient and hopefully proceed with scheduling procedures with improved respiratory status. Follow-up in 2 months.

## 2018-12-30 NOTE — Telephone Encounter (Signed)
[place], I've called [employer] several times. I wasn't able to find out the cost of these medications. The pharmacy also said the only way for them to know the cost of medication is if we send the RX in to the pharmacy.

## 2018-12-30 NOTE — Telephone Encounter (Signed)
PA for Linzess was denied through [email or web address].

## 2018-12-31 NOTE — Telephone Encounter (Signed)
Noted. If patient is agreeable, I will send in Trulance to patients pharmacy. Unfortunately, we do not have any samples. Do we have any discount cards for this?

## 2019-01-03 ENCOUNTER — Other Ambulatory Visit: Payer: Self-pay | Admitting: Gastroenterology

## 2019-01-03 DIAGNOSIS — K5900 Constipation, unspecified: Secondary | ICD-10-CM

## 2019-01-03 MED ORDER — TRULANCE 3 MG PO TABS: 3.0000 mg | ORAL_TABLET | Freq: Every day | ORAL | 5 refills | Status: DC

## 2019-01-03 NOTE — Telephone Encounter (Signed)
Noted. Pt is aware. Mailing rebate card so pt can use it needed.

## 2019-01-03 NOTE — Telephone Encounter (Signed)
Lmom, waiting on a return call for pt.

## 2019-01-03 NOTE — Telephone Encounter (Signed)
Pt returned call and is ok with trying the Trulance. We do currently have [employer] cards in office pt can try if medication isn't covered under insurance.

## 2019-01-03 NOTE — Telephone Encounter (Signed)
OK. I am sending prescription for Trulance 3 mg daily.

## 2019-01-05 NOTE — Telephone Encounter (Signed)
Received a PA denial from [employer]. Lmom, to discuss with pt.

## 2019-01-05 NOTE — Telephone Encounter (Signed)
Received a PA request from pt's pharmacy. PA was submitted through [email or web address]. waiting on an approval or denial.

## 2019-01-11 DIAGNOSIS — G43909 Migraine, unspecified, not intractable, without status migrainosus: Secondary | ICD-10-CM | POA: Diagnosis not present

## 2019-01-11 DIAGNOSIS — Z6841 Body Mass Index (BMI) 40.0 and over, adult: Secondary | ICD-10-CM | POA: Diagnosis not present

## 2019-01-11 DIAGNOSIS — I10 Essential (primary) hypertension: Secondary | ICD-10-CM | POA: Diagnosis not present

## 2019-01-11 DIAGNOSIS — Z299 Encounter for prophylactic measures, unspecified: Secondary | ICD-10-CM | POA: Diagnosis not present

## 2019-01-11 DIAGNOSIS — M797 Fibromyalgia: Secondary | ICD-10-CM | POA: Diagnosis not present

## 2019-01-18 ENCOUNTER — Telehealth: Payer: Self-pay | Admitting: Internal Medicine

## 2019-01-18 NOTE — Telephone Encounter (Signed)
Pt had mailed me back a signed release to get ER records from [place]. She is aware that I have requested those. She said she thinks that AM had tried calling her as well. I told her AM was at lunch, but I would send her a message to call if needed. Pt agreed. [phone]

## 2019-01-18 NOTE — Telephone Encounter (Signed)
Returned pt's call. I previously left a message to discuss the Trulance not being covered under insurance. PA was denied. When samples of Trulance or Amitiza come in, I'm going to call pt to see if she's able to tolerate them. Pt is in agreement with this and has medication Linzess at home if she can't have a bowel movement until samples arrive.

## 2019-01-19 DIAGNOSIS — J343 Hypertrophy of nasal turbinates: Secondary | ICD-10-CM | POA: Diagnosis not present

## 2019-01-19 DIAGNOSIS — J310 Chronic rhinitis: Secondary | ICD-10-CM | POA: Diagnosis not present

## 2019-01-19 DIAGNOSIS — J3503 Chronic tonsillitis and adenoiditis: Secondary | ICD-10-CM | POA: Diagnosis not present

## 2019-01-19 DIAGNOSIS — J342 Deviated nasal septum: Secondary | ICD-10-CM | POA: Diagnosis not present

## 2019-01-25 ENCOUNTER — Other Ambulatory Visit: Payer: Self-pay | Admitting: Otolaryngology

## 2019-01-25 ENCOUNTER — Other Ambulatory Visit (HOSPITAL_COMMUNITY): Payer: Self-pay | Admitting: Otolaryngology

## 2019-01-25 DIAGNOSIS — J329 Chronic sinusitis, unspecified: Secondary | ICD-10-CM

## 2019-01-25 NOTE — Telephone Encounter (Signed)
Lmom, Amitiza 8 mcg samples arrived and are ready for pickup.

## 2019-01-28 ENCOUNTER — Other Ambulatory Visit: Payer: Self-pay

## 2019-01-28 ENCOUNTER — Ambulatory Visit (INDEPENDENT_AMBULATORY_CARE_PROVIDER_SITE_OTHER): Payer: Medicare Other | Admitting: Cardiology

## 2019-01-28 ENCOUNTER — Encounter: Payer: Self-pay | Admitting: Cardiology

## 2019-01-28 VITALS — BP 130/84 | HR 88 | Ht 66.0 in | Wt 281.6 lb

## 2019-01-28 DIAGNOSIS — I498 Other specified cardiac arrhythmias: Secondary | ICD-10-CM

## 2019-01-28 DIAGNOSIS — G90A Postural orthostatic tachycardia syndrome (POTS): Secondary | ICD-10-CM

## 2019-01-28 MED ORDER — ATENOLOL 25 MG PO TABS: 25.0000 mg | ORAL_TABLET | Freq: Every day | ORAL | 6 refills | Status: AC

## 2019-01-28 NOTE — Patient Instructions (Signed)
Medication Instructions:   Your physician has recommended you make the following change in your medication:   Increase atenolol to 25 mg by mouth daily  Continue other medications the same  Labwork:  NONE  Testing/Procedures:  NONE  Follow-Up:  Your physician recommends that you schedule a follow-up appointment in: 4 months.  Any Other Special Instructions Will Be Listed Below (If Applicable).  If you need a refill on your cardiac medications before your next appointment, please call your pharmacy.

## 2019-01-28 NOTE — Progress Notes (Signed)
Cardiology Office Note  Date: 01/28/2019   ID: Sandra BowenChristy [name], DOB 07-16-1975, MRN [id number]  PCP:  Ignatius SpeckingVyas, Dhruv B, MD  Cardiologist:  Nona DellSamuel Jovanni Eckhart, MD Electrophysiologist:  None   Chief Complaint  Patient presents with  . Cardiac follow-up    History of Present Illness: Sandra [name] is a 43 y.o. female seen in consultation back in October.  She presents for a follow-up visit.  She tells me that she has had trouble with her tonsils and will be undergoing a tonsillectomy with ENT after the turn of the year.  She has had trouble with recurrent infections and chest congestion.  She has not been able to exercise regularly, reports chronic shortness of breath.  We did resume atenolol 12.5 mg at the last visit for treatment of POTS.  We discussed further advancing the dose.  I also talked with her about maintaining adequate hydration and a basic walking plan for exercise.   Past Medical History:  Diagnosis Date  . Anxiety   . Arthritis   . Chronic back pain   . Chronic migraine   . Constipation   . Depression   . Fibromyalgia   . GERD (gastroesophageal reflux disease)   . History of bronchitis   . History of cardiac catheterization    Normal coronaries 2007  . History of gastric ulcer   . History of hiatal hernia   . History of pneumonia   . Hypertension   . Idiopathic intracranial hypertension   . Orthostatic hypotension   . POTS (postural orthostatic tachycardia syndrome)   . Seasonal allergies     Past Surgical History:  Procedure Laterality Date  . ABDOMINAL HYSTERECTOMY    . BACK SURGERY    . BLADDER SURGERY    . BREAST SURGERY Left    Lumpectomy  . CARDIAC CATHETERIZATION  2010  . CHOLECYSTECTOMY    . COLONOSCOPY  2003   Moderate external hemorrhoids with normal TI  . CYSTOSCOPY W/ URETERAL STENT REMOVAL    . ESOPHAGEAL DILATION     4-5 times  . ESOPHAGOGASTRODUODENOSCOPY  2003   Small sliding hiatal hernia, erosive gastritis  .  ESOPHAGOGASTRODUODENOSCOPY  2004   Erosive reflux esophagitis with Schatzki ring s/p dilation, erosive antral gastritis  . ESOPHAGOGASTRODUODENOSCOPY  2006   Non-critical Schatzki ring proximal to GEJ, s/p 54 and 56 Maloney dilatation. ?Candida esophagitis  . ESOPHAGOGASTRODUODENOSCOPY  2009   Small hiatal hernia  . LUMBAR LAMINECTOMY/DECOMPRESSION MICRODISCECTOMY Left 01/11/2014   Procedure: LUMBAR LAMINECTOMY/DECOMPRESSION MICRODISCECTOMY 1 LEVEL LEFT LUMBAR TWO/THREE;  Surgeon: Tressie StalkerJeffrey Jenkins, MD;  Location: [facility];  Service: Neurosurgery;  Laterality: Left;  . TUBAL LIGATION    . ventrical atrium shunt  04/2018  . VENTRICULOPERITONEAL SHUNT Right 11/19/2015   Procedure: VENTRICULAR-PERITONEAL SHUNT RIGHT;  Surgeon: Tressie StalkerJeffrey Jenkins, MD;  Location: [facility];  Service: Neurosurgery;  Laterality: Right;  Right    Current Outpatient Medications  Medication Sig Dispense Refill  . atenolol (TENORMIN) 25 MG tablet Take 1 tablet (25 mg total) by mouth daily. 30 tablet 6  . Azelastine HCl 137 MCG/SPRAY SOLN Place 2 puffs into both nostrils daily.    Marland Kitchen. BREO ELLIPTA 100-25 MCG/INH AEPB Inhale 1 puff into the lungs daily.    . butalbital-acetaminophen-caffeine (FIORICET, ESGIC) 50-325-40 MG tablet Take 1 tablet by mouth every 6 (six) hours as needed for headache. 20 tablet 0  . cetirizine (ZYRTEC) 10 MG tablet Take 10 mg by mouth daily.     Marland Kitchen. Dextromethorphan-guaiFENesin [facility](MUCINEX  DM PO) Take by mouth as needed.    . diazepam (VALIUM) 5 MG tablet Take 5 mg by mouth every 6 (six) hours as needed for anxiety.    . diphenhydrAMINE (BENADRYL) 25 mg capsule Take 25 mg by mouth every 6 (six) hours as needed for itching.    . hydrochlorothiazide (HYDRODIURIL) 12.5 MG tablet Take 12.5 mg by mouth daily.   1  . ibuprofen (ADVIL,MOTRIN) 100 MG/5ML suspension Take 300 mg by mouth every 4 (four) hours as needed for fever or mild pain.    Marland Kitchen lubiprostone (AMITIZA) 8 MCG capsule Take 1 capsule (8 mcg  total) by mouth 2 (two) times daily with a meal. 60 capsule 5  . meclizine (ANTIVERT) 25 MG tablet Take 1 tablet (25 mg total) by mouth 3 (three) times daily as needed for dizziness. 30 tablet 1  . pantoprazole (PROTONIX) 40 MG tablet Take 1 tablet (40 mg total) by mouth 2 (two) times daily. 60 tablet 5  . pregabalin (LYRICA) 300 MG capsule Take 150 mg by mouth at bedtime.     . rizatriptan (MAXALT-MLT) 5 MG disintegrating tablet Take 5 mg by mouth daily as needed for migraine. May repeat in 2 hours if needed    . tetrahydrozoline (VISINE) 0.05 % ophthalmic solution Place 1 drop into both eyes as needed (For allergies).     No current facility-administered medications for this visit.    Allergies:  Ambien [zolpidem tartrate], Penicillins, Topamax [topiramate], Chlorhexidine gluconate, Hydrocodone-acetaminophen, Latex, and Versed [midazolam]   Social History: The patient  reports that she has never smoked. She has never used smokeless tobacco. She reports that she does not drink alcohol or use drugs.   ROS:  Please see the history of present illness. Otherwise, complete review of systems is positive for mild ankle swelling.  All other systems are reviewed and negative.   Physical Exam: VS:  BP 130/84   Pulse 88   Ht 5\' 6"  (1.676 m)   Wt 281 lb 9.6 oz (127.7 kg)   SpO2 96%   BMI 45.45 kg/m , BMI Body mass index is 45.45 kg/m.  Wt Readings from Last 3 Encounters:  01/28/19 281 lb 9.6 oz (127.7 kg)  12/20/18 285 lb 9.6 oz (129.5 kg)  11/26/18 286 lb (129.7 kg)    General: Patient appears comfortable at rest. HEENT: Conjunctiva and lids normal, wearing a mask. Neck: Supple, no elevated JVP or carotid bruits, no thyromegaly. Lungs: Clear to auscultation, nonlabored breathing at rest. Cardiac: Regular rate and rhythm, no S3, soft systolic murmur, no pericardial rub. Abdomen: Soft, nontender, bowel sounds present. Extremities: Trace ankle edema, distal pulses 2+.  ECG:  An ECG dated  11/26/2018 was personally reviewed today and demonstrated:  Normal sinus rhythm at 89 bpm.  Recent Labwork:  No interval lab work for review.  Other Studies Reviewed Today:  Echocardiogram 04/04/2015: Study Conclusions  - Left ventricle: The cavity size was normal. Wall thickness was normal. Systolic function was normal. The estimated ejection fraction was in the range of 60% to 65%. Wall motion was normal; there were no regional wall motion abnormalities. Left ventricular diastolic function parameters were normal. - Tricuspid valve: There was mild regurgitation. - Pulmonary arteries: PA peak pressure: 27 mm Hg (S). - Systemic veins: Dilated IVC with normal respiratory variation. - CVP 8 mmHg.  Assessment and Plan:  1.  Chronic dyspnea on exertion and suspected POTS as discussed previously.  Plan to increase atenolol to 25 mg daily.  Discussed  hydration and walking plan for exercise.  Most recent echocardiogram from [facility] is being requested for review.  2.  Normal coronaries a cardiac catheterization in 2007.  Medication Adjustments/Labs and Tests Ordered: Current medicines are reviewed at length with the patient today.  Concerns regarding medicines are outlined above.   Tests Ordered: No orders of the defined types were placed in this encounter.   Medication Changes: Meds ordered this encounter  Medications  . atenolol (TENORMIN) 25 MG tablet    Sig: Take 1 tablet (25 mg total) by mouth daily.    Dispense:  30 tablet    Refill:  6    01/28/2019 dose increase    Disposition:  Follow up 4 months in the [place] office.  Signed, Jonelle Sidle, MD, [facility] 01/28/2019 3:16 PM    [facility] at [facility] [address] [place], [place], [place] [address] Phone: [phone]; Fax: [phone]

## 2019-02-08 ENCOUNTER — Other Ambulatory Visit: Payer: Medicare Other

## 2019-02-10 ENCOUNTER — Other Ambulatory Visit: Payer: Medicare Other

## 2019-02-11 ENCOUNTER — Ambulatory Visit (HOSPITAL_COMMUNITY): Payer: Medicare Other

## 2019-02-14 ENCOUNTER — Ambulatory Visit: Payer: Medicare Other | Attending: Internal Medicine

## 2019-02-14 ENCOUNTER — Other Ambulatory Visit: Payer: Self-pay

## 2019-02-14 DIAGNOSIS — Z20822 Contact with and (suspected) exposure to covid-19: Secondary | ICD-10-CM

## 2019-02-15 LAB — NOVEL CORONAVIRUS, NAA: SARS-CoV-2, NAA: NOT DETECTED

## 2019-02-19 NOTE — Progress Notes (Deleted)
Referring Provider: Ignatius Specking, MD Primary Care Physician:  Ignatius Specking, MD Primary GI Physician: Dr. Jena Gauss  No chief complaint on file.   HPI:   Sandra [name] is a 43 y.o. female with a history significant for idiopathic intracranial hypertension followed by [facility] with recent [facility] shunt placed on 05/06/2018, GI history significant for chronic constipation, GERD, and dysphagia.  Last seen on 12/20/18 for follow-up of abdominal pain, constipation, and to reschedule her TCS and EGD. Currently spot for EGD +/- dil/TCS being held on 03/31/19 for dysphagia, dyspepsia, and rectal bleeding. Did not officially schedule due to ongoing upper respiratory illness. GERD wasn't well controlled, continued with postprandial epigastric burning as well as epigastric pain and LUQ pain that improved some after a BM. Protonix increased to BID. Constipation poorly manged. Was taking 1 Colace daily. Linzess even at low doses caused diarrhea. Plans were to try Amitiza 8 mcg twice daily. Follow-up in 2 months. PA for Amitiza was denied.  Trulance sent to patient's pharmacy.  PA for Trulance was denied.  Last telephone note on 01/25/2019 with voicemail left to let patient know Amitiza samples were available.   Today:    *With propofol.   Past Medical History:  Diagnosis Date  . Anxiety   . Arthritis   . Chronic back pain   . Chronic migraine   . Constipation   . Depression   . Fibromyalgia   . GERD (gastroesophageal reflux disease)   . History of bronchitis   . History of cardiac catheterization    Normal coronaries 2007  . History of gastric ulcer   . History of hiatal hernia   . History of pneumonia   . Hypertension   . Idiopathic intracranial hypertension   . Orthostatic hypotension   . POTS (postural orthostatic tachycardia syndrome)   . Seasonal allergies     Past Surgical History:  Procedure Laterality Date  . ABDOMINAL HYSTERECTOMY    . BACK SURGERY    . BLADDER SURGERY    . BREAST  SURGERY Left    Lumpectomy  . CARDIAC CATHETERIZATION  2010  . CHOLECYSTECTOMY    . COLONOSCOPY  2003   Moderate external hemorrhoids with normal TI  . CYSTOSCOPY W/ URETERAL STENT REMOVAL    . ESOPHAGEAL DILATION     4-5 times  . ESOPHAGOGASTRODUODENOSCOPY  2003   Small sliding hiatal hernia, erosive gastritis  . ESOPHAGOGASTRODUODENOSCOPY  2004   Erosive reflux esophagitis with Schatzki ring s/p dilation, erosive antral gastritis  . ESOPHAGOGASTRODUODENOSCOPY  2006   Non-critical Schatzki ring proximal to GEJ, s/p 54 and 56 Maloney dilatation. ?Candida esophagitis  . ESOPHAGOGASTRODUODENOSCOPY  2009   Small hiatal hernia  . LUMBAR LAMINECTOMY/DECOMPRESSION MICRODISCECTOMY Left 01/11/2014   Procedure: LUMBAR LAMINECTOMY/DECOMPRESSION MICRODISCECTOMY 1 LEVEL LEFT LUMBAR TWO/THREE;  Surgeon: Tressie Stalker, MD;  Location: [facility];  Service: Neurosurgery;  Laterality: Left;  . TUBAL LIGATION    . ventrical atrium shunt  04/2018  . VENTRICULOPERITONEAL SHUNT Right 11/19/2015   Procedure: VENTRICULAR-PERITONEAL SHUNT RIGHT;  Surgeon: Tressie Stalker, MD;  Location: [facility];  Service: Neurosurgery;  Laterality: Right;  Right    Current Outpatient Medications  Medication Sig Dispense Refill  . atenolol (TENORMIN) 25 MG tablet Take 1 tablet (25 mg total) by mouth daily. 30 tablet 6  . Azelastine HCl 137 MCG/SPRAY SOLN Place 2 puffs into both nostrils daily.    Marland Kitchen BREO ELLIPTA 100-25 MCG/INH AEPB Inhale 1 puff into the lungs daily.    Marland Kitchen  butalbital-acetaminophen-caffeine (FIORICET, ESGIC) 50-325-40 MG tablet Take 1 tablet by mouth every 6 (six) hours as needed for headache. 20 tablet 0  . cetirizine (ZYRTEC) 10 MG tablet Take 10 mg by mouth daily.     Marland Kitchen Dextromethorphan-guaiFENesin ([place] DM PO) Take by mouth as needed.    . diazepam (VALIUM) 5 MG tablet Take 5 mg by mouth every 6 (six) hours as needed for anxiety.    . diphenhydrAMINE (BENADRYL) 25 mg capsule Take 25 mg by mouth  every 6 (six) hours as needed for itching.    . hydrochlorothiazide (HYDRODIURIL) 12.5 MG tablet Take 12.5 mg by mouth daily.   1  . ibuprofen (ADVIL,MOTRIN) 100 MG/5ML suspension Take 300 mg by mouth every 4 (four) hours as needed for fever or mild pain.    Marland Kitchen lubiprostone (AMITIZA) 8 MCG capsule Take 1 capsule (8 mcg total) by mouth 2 (two) times daily with a meal. 60 capsule 5  . meclizine (ANTIVERT) 25 MG tablet Take 1 tablet (25 mg total) by mouth 3 (three) times daily as needed for dizziness. 30 tablet 1  . pantoprazole (PROTONIX) 40 MG tablet Take 1 tablet (40 mg total) by mouth 2 (two) times daily. 60 tablet 5  . pregabalin (LYRICA) 300 MG capsule Take 150 mg by mouth at bedtime.     . rizatriptan (MAXALT-MLT) 5 MG disintegrating tablet Take 5 mg by mouth daily as needed for migraine. May repeat in 2 hours if needed    . tetrahydrozoline (VISINE) 0.05 % ophthalmic solution Place 1 drop into both eyes as needed (For allergies).     No current facility-administered medications for this visit.    Allergies as of 02/21/2019 - Review Complete 01/28/2019  Allergen Reaction Noted  . Ambien [zolpidem tartrate] Rash and Other (See Comments) 01/27/2013  . Penicillins Hives and Swelling 01/05/2014  . Topamax [topiramate] Other (See Comments) 01/10/2014  . Chlorhexidine gluconate Itching and Rash 11/19/2015  . Hydrocodone-acetaminophen Itching   . Latex Rash 04/04/2015  . Versed [midazolam] Itching 02/03/2014    Family History  Problem Relation Age of Onset  . Kidney cancer Mother   . Diabetes Mother   . Hypertension Mother   . Thyroid disease Mother   . Ulcers Father   . Coronary artery disease Father        Premature CAD  . Hypertension Father   . Prostate cancer Maternal Grandfather   . Pancreatic cancer Maternal Grandmother   . Lung cancer Paternal Grandmother   . Aortic aneurysm Other   . Colon cancer Neg Hx     Social History   Socioeconomic History  . Marital status:  Married    Spouse name: Jobey  . Number of children: 2  . Years of education: 45  . Highest education level: Not on file  Occupational History    Comment: [employer]  Tobacco Use  . Smoking status: Never Smoker  . Smokeless tobacco: Never Used  Substance and Sexual Activity  . Alcohol use: No    Alcohol/week: 0.0 standard drinks  . Drug use: No  . Sexual activity: Not on file  Other Topics Concern  . Not on file  Social History Narrative   Patient lives at home with her children and she is se prated.   Patient is out of work at this time.   Education one year of college.   Caffeine one cup of sweet daily.   Social Determinants of Health   Financial Resource Strain:   .  Difficulty of Paying Living Expenses: Not on file  Food Insecurity:   . Worried About Programme researcher, broadcasting/film/videounning Out of Food in the Last Year: Not on file  . Ran Out of Food in the Last Year: Not on file  Transportation Needs:   . Lack of Transportation (Medical): Not on file  . Lack of Transportation (Non-Medical): Not on file  Physical Activity:   . Days of Exercise per Week: Not on file  . Minutes of Exercise per Session: Not on file  Stress:   . Feeling of Stress : Not on file  Social Connections:   . Frequency of Communication with Friends and Family: Not on file  . Frequency of Social Gatherings with Friends and Family: Not on file  . Attends Religious Services: Not on file  . Active Member of Clubs or Organizations: Not on file  . Attends BankerClub or Organization Meetings: Not on file  . Marital Status: Not on file    Review of Systems: Gen: Denies fever, chills, anorexia. Denies fatigue, weakness, weight loss.  CV: Denies chest pain, palpitations, syncope, peripheral edema, and claudication. Resp: Denies dyspnea at rest, cough, wheezing, coughing up blood, and pleurisy. GI: Denies vomiting blood, jaundice, and fecal incontinence.   Denies dysphagia or odynophagia. Derm: Denies rash, itching, dry skin Psych:  Denies depression, anxiety, memory loss, confusion. No homicidal or suicidal ideation.  Heme: Denies bruising, bleeding, and enlarged lymph nodes.  Physical Exam: There were no vitals taken for this visit. General:   Alert and oriented. No distress noted. Pleasant and cooperative.  Head:  Normocephalic and atraumatic. Eyes:  Conjuctiva clear without scleral icterus. Mouth:  Oral mucosa pink and moist. Good dentition. No lesions. Heart:  S1, S2 present without murmurs appreciated. Lungs:  Clear to auscultation bilaterally. No wheezes, rales, or rhonchi. No distress.  Abdomen:  +BS, soft, non-tender and non-distended. No rebound or guarding. No HSM or masses noted. Msk:  Symmetrical without gross deformities. Normal posture. Extremities:  Without edema. Neurologic:  Alert and  oriented x4 Psych:  Alert and cooperative. Normal mood and affect.

## 2019-02-21 ENCOUNTER — Encounter: Payer: Self-pay | Admitting: Gastroenterology

## 2019-02-21 ENCOUNTER — Ambulatory Visit: Payer: Medicare Other | Admitting: Gastroenterology

## 2019-03-07 ENCOUNTER — Ambulatory Visit (HOSPITAL_COMMUNITY)
Admission: RE | Admit: 2019-03-07 | Discharge: 2019-03-07 | Disposition: A | Discharge status: Home / Self Care | Payer: Medicare Other | Source: Ambulatory Visit | Attending: Otolaryngology | Admitting: Otolaryngology

## 2019-03-07 ENCOUNTER — Other Ambulatory Visit: Payer: Self-pay

## 2019-03-07 DIAGNOSIS — J329 Chronic sinusitis, unspecified: Secondary | ICD-10-CM

## 2019-03-10 ENCOUNTER — Telehealth: Payer: Self-pay

## 2019-03-10 DIAGNOSIS — M5116 Intervertebral disc disorders with radiculopathy, lumbar region: Secondary | ICD-10-CM | POA: Diagnosis not present

## 2019-03-10 DIAGNOSIS — R29898 Other symptoms and signs involving the musculoskeletal system: Secondary | ICD-10-CM | POA: Diagnosis not present

## 2019-03-10 DIAGNOSIS — M4316 Spondylolisthesis, lumbar region: Secondary | ICD-10-CM | POA: Diagnosis not present

## 2019-03-10 DIAGNOSIS — M5416 Radiculopathy, lumbar region: Secondary | ICD-10-CM | POA: Diagnosis not present

## 2019-03-10 DIAGNOSIS — M48061 Spinal stenosis, lumbar region without neurogenic claudication: Secondary | ICD-10-CM | POA: Diagnosis not present

## 2019-03-10 DIAGNOSIS — M4180 Other forms of scoliosis, site unspecified: Secondary | ICD-10-CM | POA: Diagnosis not present

## 2019-03-10 DIAGNOSIS — M4726 Other spondylosis with radiculopathy, lumbar region: Secondary | ICD-10-CM | POA: Diagnosis not present

## 2019-03-10 NOTE — Telephone Encounter (Signed)
Noted  

## 2019-03-10 NOTE — Telephone Encounter (Signed)
Spot was being held for TCS/EGD/-/+DIL 03/31/19. Pt was no show for OV 02/21/19. Held spot has been removed for procedure.  FYI to [facility].

## 2019-03-15 DIAGNOSIS — Z0000 Encounter for general adult medical examination without abnormal findings: Secondary | ICD-10-CM | POA: Diagnosis not present

## 2019-03-15 DIAGNOSIS — Z1339 Encounter for screening examination for other mental health and behavioral disorders: Secondary | ICD-10-CM | POA: Diagnosis not present

## 2019-03-15 DIAGNOSIS — R5383 Other fatigue: Secondary | ICD-10-CM | POA: Diagnosis not present

## 2019-03-15 DIAGNOSIS — Z6841 Body Mass Index (BMI) 40.0 and over, adult: Secondary | ICD-10-CM | POA: Diagnosis not present

## 2019-03-15 DIAGNOSIS — Z79899 Other long term (current) drug therapy: Secondary | ICD-10-CM | POA: Diagnosis not present

## 2019-03-15 DIAGNOSIS — E7800 Pure hypercholesterolemia, unspecified: Secondary | ICD-10-CM | POA: Diagnosis not present

## 2019-03-15 DIAGNOSIS — Z1211 Encounter for screening for malignant neoplasm of colon: Secondary | ICD-10-CM | POA: Diagnosis not present

## 2019-03-15 DIAGNOSIS — Z7189 Other specified counseling: Secondary | ICD-10-CM | POA: Diagnosis not present

## 2019-03-15 DIAGNOSIS — I10 Essential (primary) hypertension: Secondary | ICD-10-CM | POA: Diagnosis not present

## 2019-03-15 DIAGNOSIS — J45909 Unspecified asthma, uncomplicated: Secondary | ICD-10-CM | POA: Diagnosis not present

## 2019-03-15 DIAGNOSIS — Z1331 Encounter for screening for depression: Secondary | ICD-10-CM | POA: Diagnosis not present

## 2019-03-15 DIAGNOSIS — Z299 Encounter for prophylactic measures, unspecified: Secondary | ICD-10-CM | POA: Diagnosis not present

## 2019-03-16 ENCOUNTER — Emergency Department (HOSPITAL_COMMUNITY)
Admission: EM | Admit: 2019-03-16 | Discharge: 2019-03-16 | Disposition: A | Discharge status: Home / Self Care | Payer: Medicare Other | Attending: Emergency Medicine | Admitting: Emergency Medicine

## 2019-03-16 ENCOUNTER — Ambulatory Visit (INDEPENDENT_AMBULATORY_CARE_PROVIDER_SITE_OTHER): Payer: Medicare Other | Admitting: Gastroenterology

## 2019-03-16 ENCOUNTER — Emergency Department (HOSPITAL_COMMUNITY): Payer: Medicare Other

## 2019-03-16 ENCOUNTER — Other Ambulatory Visit: Payer: Self-pay

## 2019-03-16 ENCOUNTER — Encounter: Payer: Self-pay | Admitting: Internal Medicine

## 2019-03-16 ENCOUNTER — Encounter: Payer: Self-pay | Admitting: Gastroenterology

## 2019-03-16 ENCOUNTER — Encounter (HOSPITAL_COMMUNITY): Payer: Self-pay

## 2019-03-16 VITALS — BP 90/56 | HR 62 | Temp 96.9°F | Ht 66.0 in | Wt 284.2 lb

## 2019-03-16 DIAGNOSIS — N200 Calculus of kidney: Secondary | ICD-10-CM | POA: Diagnosis not present

## 2019-03-16 DIAGNOSIS — Z91040 Latex allergy status: Secondary | ICD-10-CM | POA: Diagnosis not present

## 2019-03-16 DIAGNOSIS — K625 Hemorrhage of anus and rectum: Secondary | ICD-10-CM | POA: Insufficient documentation

## 2019-03-16 DIAGNOSIS — R197 Diarrhea, unspecified: Secondary | ICD-10-CM | POA: Diagnosis not present

## 2019-03-16 DIAGNOSIS — I10 Essential (primary) hypertension: Secondary | ICD-10-CM | POA: Diagnosis not present

## 2019-03-16 DIAGNOSIS — R109 Unspecified abdominal pain: Secondary | ICD-10-CM

## 2019-03-16 DIAGNOSIS — I959 Hypotension, unspecified: Secondary | ICD-10-CM | POA: Diagnosis not present

## 2019-03-16 DIAGNOSIS — Z79899 Other long term (current) drug therapy: Secondary | ICD-10-CM | POA: Insufficient documentation

## 2019-03-16 LAB — CBC WITH DIFFERENTIAL/PLATELET
Abs Immature Granulocytes: 0.29 10*3/uL — ABNORMAL HIGH (ref 0.00–0.07)
Basophils Absolute: 0.1 10*3/uL (ref 0.0–0.1)
Basophils Relative: 1 %
Eosinophils Absolute: 0.1 10*3/uL (ref 0.0–0.5)
Eosinophils Relative: 1 %
HCT: 40.6 % (ref 36.0–46.0)
Hemoglobin: 13.3 g/dL (ref 12.0–15.0)
Immature Granulocytes: 3 %
Lymphocytes Relative: 37 %
Lymphs Abs: 3.6 10*3/uL (ref 0.7–4.0)
MCH: 30 pg (ref 26.0–34.0)
MCHC: 32.8 g/dL (ref 30.0–36.0)
MCV: 91.6 fL (ref 80.0–100.0)
Monocytes Absolute: 0.5 10*3/uL (ref 0.1–1.0)
Monocytes Relative: 6 %
Neutro Abs: 5.1 10*3/uL (ref 1.7–7.7)
Neutrophils Relative %: 52 %
Platelets: 200 10*3/uL (ref 150–400)
RBC: 4.43 MIL/uL (ref 3.87–5.11)
RDW: 13.2 % (ref 11.5–15.5)
WBC: 9.6 10*3/uL (ref 4.0–10.5)
nRBC: 0 % (ref 0.0–0.2)

## 2019-03-16 LAB — URINALYSIS, ROUTINE W REFLEX MICROSCOPIC
Bilirubin Urine: NEGATIVE
Glucose, UA: NEGATIVE mg/dL
Hgb urine dipstick: NEGATIVE
Ketones, ur: NEGATIVE mg/dL
Leukocytes,Ua: NEGATIVE
Nitrite: NEGATIVE
Protein, ur: NEGATIVE mg/dL
Specific Gravity, Urine: 1.032 — ABNORMAL HIGH (ref 1.005–1.030)
pH: 5 (ref 5.0–8.0)

## 2019-03-16 LAB — COMPREHENSIVE METABOLIC PANEL
ALT: 13 U/L (ref 0–44)
AST: 19 U/L (ref 15–41)
Albumin: 3.2 g/dL — ABNORMAL LOW (ref 3.5–5.0)
Alkaline Phosphatase: 77 U/L (ref 38–126)
Anion gap: 9 (ref 5–15)
BUN: 22 mg/dL — ABNORMAL HIGH (ref 6–20)
CO2: 24 mmol/L (ref 22–32)
Calcium: 8.4 mg/dL — ABNORMAL LOW (ref 8.9–10.3)
Chloride: 101 mmol/L (ref 98–111)
Creatinine, Ser: 1.37 mg/dL — ABNORMAL HIGH (ref 0.44–1.00)
GFR calc Af Amer: 54 mL/min — ABNORMAL LOW (ref >60)
GFR calc non Af Amer: 47 mL/min — ABNORMAL LOW (ref >60)
Glucose, Bld: 97 mg/dL (ref 70–99)
Potassium: 3.9 mmol/L (ref 3.5–5.1)
Sodium: 134 mmol/L — ABNORMAL LOW (ref 135–145)
Total Bilirubin: 0.7 mg/dL (ref 0.3–1.2)
Total Protein: 6.7 g/dL (ref 6.5–8.1)

## 2019-03-16 LAB — LIPASE, BLOOD: Lipase: 15 U/L (ref 11–51)

## 2019-03-16 MED ORDER — SODIUM CHLORIDE 0.9 % IV BOLUS
250.0000 mL | Freq: Once | INTRAVENOUS | Status: AC
  Administered 2019-03-16: 13:00:00 250 mL via INTRAVENOUS

## 2019-03-16 MED ORDER — HYDROCORTISONE ACETATE 25 MG RE SUPP: 25.0000 mg | Freq: Two times a day (BID) | RECTAL | 1 refills | Status: DC

## 2019-03-16 MED ORDER — IOHEXOL 300 MG/ML  SOLN
100.0000 mL | Freq: Once | INTRAMUSCULAR | Status: AC | PRN
  Administered 2019-03-16: 14:00:00 100 mL via INTRAVENOUS

## 2019-03-16 MED ORDER — SODIUM CHLORIDE 0.9 % IV BOLUS
1000.0000 mL | Freq: Once | INTRAVENOUS | Status: AC
  Administered 2019-03-16: 15:00:00 1000 mL via INTRAVENOUS

## 2019-03-16 NOTE — Assessment & Plan Note (Addendum)
44 year old female with new onset of bloody diarrhea and abdominal pain that started yesterday evening.  Last bloody BM around 2 AM this morning.  Abdominal pain has persisted.  About 9/10 in severity.  Associated with nausea without vomiting, weakness, cold sweats, and feeling like she will pass out. Denies syncope.  Also reports her urine is dark and her mouth is dry.  Has had blood in her stools in the past but not this severe.  She is on antibiotics a few months ago for sinus infection.  No recent travel.  Blood pressure is 90/56. Patient is diaphoretic, clammy, and has moderate to significant tenderness to palpation in her mid and lower abdomen.  Due to severity of symptoms, she was advised to proceed to the emergency department for further evaluation.  Patient states her daughter drove her to her appointment today and will take her to the emergency department.  Concern for colitis (possible ischemic versus infectious).  She is also likely going to need fluid resuscitation due to hypotension.  May need blood depending on hemoglobin. We will follow up with her in the office in 4 weeks to regroup.

## 2019-03-16 NOTE — Assessment & Plan Note (Signed)
Addressed under bloody diarrhea.

## 2019-03-16 NOTE — ED Notes (Signed)
Per [facility], pt reports having had bloody diarrhea, weakness and bp 90's/50's.

## 2019-03-16 NOTE — Assessment & Plan Note (Signed)
Addressed on her bloody diarrhea.

## 2019-03-16 NOTE — ED Provider Notes (Signed)
[facility] EMERGENCY DEPARTMENT Provider Note   CSN: [id number] Arrival date & time: 03/16/19  1144     History Chief Complaint  Patient presents with  . Rectal Bleeding    Sandra [name] is a 44 y.o. female.  The history is provided by the patient. No language interpreter was used.  Rectal Bleeding Quality:  Bright red Amount:  Moderate Duration:  2 days Timing:  Constant Chronicity:  New Context: diarrhea   Similar prior episodes: no   Relieved by:  Nothing Worsened by:  Nothing Ineffective treatments:  None tried Associated symptoms: abdominal pain   Risk factors: no NSAID use   Pt reports she has had multiple episodes of diarrhea and is passing bright red blood with stool.  Pt went to [facility] doctor and was sent here for evaluation.      Past Medical History:  Diagnosis Date  . Anxiety   . Arthritis   . Chronic back pain   . Chronic migraine   . Constipation   . Depression   . Fibromyalgia   . GERD (gastroesophageal reflux disease)   . History of bronchitis   . History of cardiac catheterization    Normal coronaries 2007  . History of gastric ulcer   . History of hiatal hernia   . History of pneumonia   . Hypertension   . Idiopathic intracranial hypertension   . Orthostatic hypotension   . POTS (postural orthostatic tachycardia syndrome)   . Seasonal allergies     Patient Active Problem List   Diagnosis Date Noted  . Bloody diarrhea 03/16/2019  . Constipation 12/20/2018  . Dysphagia 03/17/2018  . Rectal bleeding 03/17/2018  . Abdominal pain 07/15/2017  . Fever, unspecified 07/15/2017  . Shunt malfunction 11/19/2015  . Acute renal failure ([place])   . Headache, migraine   . POTS (postural orthostatic tachycardia syndrome) 04/04/2015  . Hypotension 04/04/2015  . SIRS (systemic inflammatory response syndrome) ([place]) 04/04/2015  . Pyrexia   . Pseudotumor cerebri 02/03/2014  . Possible Bacterial meningitis 02/03/2014  . Lumbar herniated disc 01/11/2014   . Morbid obesity ([place]) 01/21/2010  . INTERMEDIATE CORONARY SYNDROME 01/21/2010  . Headache, spinal, postoperative 01/21/2010  . UNSPECIFIED TACHYCARDIA 01/21/2010  . PALPITATIONS 01/21/2010  . PRECORDIAL PAIN 01/21/2010  . GERD (gastroesophageal reflux disease) 01/16/2010  . NAUSEA WITH VOMITING 01/16/2010  . DYSPHAGIA UNSPECIFIED 01/16/2010  . ABDOMINAL PAIN, EPIGASTRIC 01/16/2010    Past Surgical History:  Procedure Laterality Date  . ABDOMINAL HYSTERECTOMY    . BACK SURGERY    . BLADDER SURGERY    . BREAST SURGERY Left    Lumpectomy  . CARDIAC CATHETERIZATION  2010  . CHOLECYSTECTOMY    . COLONOSCOPY  2003   Moderate external hemorrhoids with normal TI  . CYSTOSCOPY W/ URETERAL STENT REMOVAL    . ESOPHAGEAL DILATION     4-5 times  . ESOPHAGOGASTRODUODENOSCOPY  2003   Small sliding hiatal hernia, erosive gastritis  . ESOPHAGOGASTRODUODENOSCOPY  2004   Erosive reflux esophagitis with Schatzki ring s/p dilation, erosive antral gastritis  . ESOPHAGOGASTRODUODENOSCOPY  2006   Non-critical Schatzki ring proximal to GEJ, s/p 54 and 56 Maloney dilatation. ?Candida esophagitis  . ESOPHAGOGASTRODUODENOSCOPY  2009   Small hiatal hernia  . LUMBAR LAMINECTOMY/DECOMPRESSION MICRODISCECTOMY Left 01/11/2014   Procedure: LUMBAR LAMINECTOMY/DECOMPRESSION MICRODISCECTOMY 1 LEVEL LEFT LUMBAR TWO/THREE;  Surgeon: Newman Pies, MD;  Location: [place] [facility];  Service: Neurosurgery;  Laterality: Left;  . TUBAL LIGATION    . ventrical atrium shunt  04/2018  .  VENTRICULOPERITONEAL SHUNT Right 11/19/2015   Procedure: VENTRICULAR-PERITONEAL SHUNT RIGHT;  Surgeon: Tressie Stalker, MD;  Location: [facility];  Service: Neurosurgery;  Laterality: Right;  Right     OB History   No obstetric history on file.     Family History  Problem Relation Age of Onset  . Kidney cancer Mother   . Diabetes Mother   . Hypertension Mother   . Thyroid disease Mother   . Ulcers Father   . Coronary artery  disease Father        Premature CAD  . Hypertension Father   . Prostate cancer Maternal Grandfather   . Pancreatic cancer Maternal Grandmother   . Lung cancer Paternal Grandmother   . Aortic aneurysm Other   . Colon cancer Neg Hx     Social History   Tobacco Use  . Smoking status: Never Smoker  . Smokeless tobacco: Never Used  Substance Use Topics  . Alcohol use: No    Alcohol/week: 0.0 standard drinks  . Drug use: No    Home Medications Prior to Admission medications   Medication Sig Start Date End Date Taking? Authorizing Provider  atenolol (TENORMIN) 25 MG tablet Take 1 tablet (25 mg total) by mouth daily. 01/28/19 04/28/19  Jonelle Sidle, MD  Azelastine HCl 137 MCG/SPRAY SOLN Place 2 puffs into both nostrils daily. 01/13/19   [provider]  BREO ELLIPTA 100-25 MCG/INH AEPB Inhale 1 puff into the lungs daily. 01/13/19   [provider]  butalbital-acetaminophen-caffeine (FIORICET, ESGIC) 50-325-40 MG tablet Take 1 tablet by mouth every 6 (six) hours as needed for headache. 04/06/15   Vassie Loll, MD  cetirizine (ZYRTEC) 10 MG tablet Take 10 mg by mouth daily.     [provider]  Dextromethorphan-guaiFENesin [facility] DM PO) Take by mouth as needed.    [provider]  diazepam (VALIUM) 5 MG tablet Take 5 mg by mouth every 6 (six) hours as needed for anxiety.    [provider]  diphenhydrAMINE (BENADRYL) 25 mg capsule Take 25 mg by mouth every 6 (six) hours as needed for itching.    [provider]  hydrochlorothiazide (HYDRODIURIL) 12.5 MG tablet Take 12.5 mg by mouth daily.  06/08/17   [provider]  lubiprostone (AMITIZA) 8 MCG capsule Take 1 capsule (8 mcg total) by mouth 2 (two) times daily with a meal. Patient not taking: Reported on 03/16/2019 12/20/18   Letta Median, PA-C  meclizine (ANTIVERT) 25 MG tablet Take 1 tablet (25 mg total) by mouth 3 (three) times daily as needed for dizziness. 08/11/14    Anson Fret, MD  pantoprazole (PROTONIX) 40 MG tablet Take 1 tablet (40 mg total) by mouth 2 (two) times daily. 12/20/18   Letta Median, PA-C  PREDNISONE PO Take 80 mg by mouth daily.    [provider]  pregabalin (LYRICA) 300 MG capsule Take 150 mg by mouth at bedtime.     [provider]  rizatriptan (MAXALT-MLT) 5 MG disintegrating tablet Take 5 mg by mouth daily as needed for migraine. May repeat in 2 hours if needed    [provider]  tetrahydrozoline (VISINE) 0.05 % ophthalmic solution Place 1 drop into both eyes as needed (For allergies).    [provider]  tiZANidine (ZANAFLEX) 4 MG capsule Take 4 mg by mouth as needed for muscle spasms.    [provider]    Allergies    Ambien [zolpidem tartrate], Penicillins, Topamax [topiramate], Chlorhexidine  gluconate, Hydrocodone-acetaminophen, Latex, and Versed [midazolam]  Review of Systems   Review of Systems  Gastrointestinal: Positive for abdominal pain and hematochezia.  All other systems reviewed and are negative.   Physical Exam Updated Vital Signs BP 102/63 (BP Location: Right Arm)   Pulse (!) 55   Temp 97.9 F (36.6 C) (Oral)   Resp 16   Ht 5\' 6"  (1.676 m)   Wt 127 kg   SpO2 98%   BMI 45.19 kg/m   Physical Exam Vitals and nursing note reviewed.  Constitutional:      Appearance: She is well-developed.  HENT:     Head: Normocephalic.     Mouth/Throat:     Mouth: Mucous membranes are dry.  Cardiovascular:     Rate and Rhythm: Normal rate and regular rhythm.     Pulses: Normal pulses.  Pulmonary:     Effort: Pulmonary effort is normal.  Abdominal:     General: There is no distension.     Tenderness: There is abdominal tenderness.     Comments: Diffuse tenderness,   Genitourinary:    Rectum: Guaiac result negative.     Comments: Small internal hemmorhoid  Musculoskeletal:        General: Normal range of motion.     Cervical back: Normal range of motion.   Skin:    General: Skin is warm.  Neurological:     General: No focal deficit present.     Mental Status: She is alert and oriented to person, place, and time.  Psychiatric:        Mood and Affect: Mood normal.     ED Results / Procedures / Treatments   Labs (all labs ordered are listed, but only abnormal results are displayed) Labs Reviewed  CBC WITH DIFFERENTIAL/PLATELET - Abnormal; Notable for the following components:      Result Value   Abs Immature Granulocytes 0.29 (*)    All other components within normal limits  COMPREHENSIVE METABOLIC PANEL - Abnormal; Notable for the following components:   Sodium 134 (*)    BUN 22 (*)    Creatinine, Ser 1.37 (*)    Calcium 8.4 (*)    Albumin 3.2 (*)    GFR calc non Af Amer 47 (*)    GFR calc Af Amer 54 (*)    All other components within normal limits  URINALYSIS, ROUTINE W REFLEX MICROSCOPIC - Abnormal; Notable for the following components:   APPearance HAZY (*)    Specific Gravity, Urine 1.032 (*)    All other components within normal limits  LIPASE, BLOOD  POC OCCULT BLOOD, ED    EKG None  Radiology CT ABDOMEN PELVIS W CONTRAST  Result Date: 03/16/2019 CLINICAL DATA:  Abdominal pain, rectal bleeding EXAM: CT ABDOMEN AND PELVIS WITH CONTRAST TECHNIQUE: Multidetector CT imaging of the abdomen and pelvis was performed using the standard protocol following bolus administration of intravenous contrast. CONTRAST:  03/18/2019 OMNIPAQUE IOHEXOL 300 MG/ML  SOLN COMPARISON:  07/15/2017 FINDINGS: Lower chest: No acute abnormality. Hepatobiliary: No solid liver abnormality is seen. Hepatic steatosis. Status post cholecystectomy. No biliary ductal dilatation. Pancreas: Unremarkable. No pancreatic ductal dilatation or surrounding inflammatory changes. Spleen: Normal in size without significant abnormality. Adrenals/Urinary Tract: Adrenal glands are unremarkable. Tiny nonobstructive bilateral renal calculi. Fluid attenuation partially exophytic  cyst of the posterior midportion of the left kidney. Bladder is unremarkable. Stomach/Bowel: Stomach is within normal limits. Appendix appears normal. No evidence of bowel wall thickening, distention, or inflammatory changes. Vascular/Lymphatic: No significant  vascular findings are present. No enlarged abdominal or pelvic lymph nodes. Reproductive: No mass or other significant abnormality. Other: No abdominal wall hernia or abnormality. No abdominopelvic ascites. Musculoskeletal: No acute or significant osseous findings. IMPRESSION: 1. No acute CT findings of the abdomen or pelvis to explain abdominal pain or rectal bleeding. 2. Hepatic steatosis. 3. Nonobstructive bilateral nephrolithiasis. 4. Status post hysterectomy and cholecystectomy. Electronically Signed   By: Lauralyn Primes M.D.   On: 03/16/2019 14:20    Procedures Procedures (including critical care time)  Medications Ordered in ED Medications  sodium chloride 0.9 % bolus 250 mL ( Intravenous Restarted 03/16/19 1350)  iohexol (OMNIPAQUE) 300 MG/ML solution 100 mL (100 mLs Intravenous Contrast Given 03/16/19 1359)  sodium chloride 0.9 % bolus 1,000 mL (1,000 mLs Intravenous New Bag/Given 03/16/19 1445)    ED Course  I have reviewed the triage vital signs and the nursing notes.  Pertinent labs & imaging results that were available during my care of the patient were reviewed by me and considered in my medical decision making (see chart for details).  Clinical Course as of Mar 15 1512  Wed Mar 16, 2019  1432 Comprehensive metabolic panel(!) [LS]    Clinical Course User Index [LS] Elson Areas, PA-C   MDM Rules/Calculators/A&P                      MDM  Pt has an image of blood in toliet.  Pt has a normal hemoglobin and is hemocult negative.  Ct scan is normal. Pt's bun and creatine are slightly elevated.  Pt given Iv fluids x 1 liters.   Pt reports she is suppose to have an upper and lower Gi study done.  I think she is stable to  follow up for these studies.  Pt given oral fluids.  Pt observed x 4 hours. No further diarrhea no bleeding.    Final Clinical Impression(s) / ED Diagnoses Final diagnoses:  Diarrhea, unspecified type    Rx / DC Orders ED Discharge Orders    None    An After Visit Summary was printed and given to the patient.    Elson Areas, [place] 03/16/19 1621    Bethann Berkshire, MD 03/18/19 1050

## 2019-03-16 NOTE — Progress Notes (Signed)
Referring Provider: Ignatius Specking, MD Primary Care Physician:  Ignatius Specking, MD Primary GI Physician: Dr. Jena Gauss  Chief Complaint  Patient presents with   Diarrhea    reports "full of blood" last night, c/o feeling very lightheaded. Took pepto to try to help    HPI:   Sandra [name] is a 44 y.o. female presenting for further evaluation of bloody diarrhea.  History significant for idiopathic intracranial hypertension followed by [facility] with recent [facility] shunt placed on 05/06/2018. Other chronic medical issues noted in PMH.  She was last seen in our office on 12/20/2018 to reschedule her colonoscopy and EGD.  TCS had been planned for painless rectal bleeding and EGD with dilation for dysphagia.  Patient's procedures had been canceled due to VA shunt placement in March.  Her shunt was reprogrammed and made 2020.  Prior to her last visit in October, she saw cardiology in consultation on 11/26/2018 for further evaluation of shortness of breath with activity and history of POTS.  She had presented to the [facility] a few days prior with chest discomfort, troponin levels normal.  Plans were to resume atenolol for pots, request records, and follow-up in 4 to 6 weeks.  She has also seen in the ED at [facility] for abdominal distention and headache in October.  Shunt series x-rays with no evidence of VA shunt discontinuity.  CT head without contrast without any acute findings.  CT abdomen and pelvis with contrast with no acute abnormalities involving the chest, abdomen, or pelvis.  Hydronephrosis noted.  Labs with CMP normal, CBC essentially normal, lipase normal, BNP normal.  At the time of her office visit, she reported 22 pound weight gain a couple weeks prior when she was on prednisone but felt she had lost some of the weight.  Abdomen felt distended and she felt she was swelling in her hands, face, and feet.  Shortness of breath had worsened over the last 2-3 weeks.  Had been on  antibiotics and prednisone for sinus infection and strep throat and completed these 2 weeks prior.  Continued with productive cough and fatigue. Reported bright red blood with most BMs with blood in the stool, toilet, and on toilet tissue.  Linzess caused a lot of diarrhea so she is not taking this regularly.  Felt she was constipated otherwise.  She continued with dysphagia.  Reported epigastric pain extending towards left ribs and her back x2 weeks that improved with BMs.  Also with breakthrough reflux symptoms.  Protonix was increased to twice daily, GERD diet/lifestyle reinforced.  Trial Amitiza 8 mcg twice daily for constipation.  She needed EGD and TCS but due to ongoing respiratory issues, she was advised to follow-up with PCP first.  Plans to follow-up in 2 months.  She did not come to her follow-up appointment so spot being held for procedures was canceled.  Today:  Walking with a walker. This is new. Has to have back surgery in 2 weeks. Has lumbar radiculopathy, degenerative lumbar disease, spondylolisthesis, and bulging disc. Left leg is completely numb. Was placed on 80 mg prednisone. Yesterday had yearly appointment with PCP. Has been feeling poor for a while. Felt week and like she would pass out for the last 2 days. States her mouth is dry and has no saliva. This started yesterday. Urine is dark. Still feeling lightheaded and week now. Feels like she has been drinking plenty of fluids.  States she is sweating a lot.  Has cold  sweats.  Bloody diarrhea started yesterday.  Was also passing clots of blood.  Last bloody BM around 2 AM this morning. Had 7 BMs last night. Associated with pain in her lower abdomin and around her belly button.  This started at the same time of diarrhea onset. Still having abdominal pain. About a 9/10. Feels like a bad menstural cramp. Admits to nausea. No vomiting. Drank 2 cups of pepto bismol and tylenol last night. This is not helping.   No recent travel. Was on  antibiotics a few months ago for a sinus infection. Has had blood in her stool before but not like this. No sick contacts. No known clotting or bleeding disorders.  Not taking ibuprofen.  She is not on any blood thinners.  No fever or chills.  Denies chest pain.  Admits to intermittent heart palpitations with history of POTS.  No palpitations right now.  Chronic shortness of breath. Sometimes short of breath at rest.  Always short of breath with exertion.  Past Medical History:  Diagnosis Date   Anxiety    Arthritis    Chronic back pain    Chronic migraine    Constipation    Depression    Fibromyalgia    GERD (gastroesophageal reflux disease)    History of bronchitis    History of cardiac catheterization    Normal coronaries 2007   History of gastric ulcer    History of hiatal hernia    History of pneumonia    Hypertension    Idiopathic intracranial hypertension    Orthostatic hypotension    POTS (postural orthostatic tachycardia syndrome)    Seasonal allergies     Past Surgical History:  Procedure Laterality Date   ABDOMINAL HYSTERECTOMY     BACK SURGERY     BLADDER SURGERY     BREAST SURGERY Left    Lumpectomy   CARDIAC CATHETERIZATION  2010   CHOLECYSTECTOMY     COLONOSCOPY  2003   Moderate external hemorrhoids with normal TI   CYSTOSCOPY W/ URETERAL STENT REMOVAL     ESOPHAGEAL DILATION     4-5 times   ESOPHAGOGASTRODUODENOSCOPY  2003   Small sliding hiatal hernia, erosive gastritis   ESOPHAGOGASTRODUODENOSCOPY  2004   Erosive reflux esophagitis with Schatzki ring s/p dilation, erosive antral gastritis   ESOPHAGOGASTRODUODENOSCOPY  2006   Non-critical Schatzki ring proximal to GEJ, s/p 54 and 56 Maloney dilatation. ?Candida esophagitis   ESOPHAGOGASTRODUODENOSCOPY  2009   Small hiatal hernia   LUMBAR LAMINECTOMY/DECOMPRESSION MICRODISCECTOMY Left 01/11/2014   Procedure: LUMBAR LAMINECTOMY/DECOMPRESSION MICRODISCECTOMY 1 LEVEL LEFT  LUMBAR TWO/THREE;  Surgeon: Tressie Stalker, MD;  Location: [facility];  Service: Neurosurgery;  Laterality: Left;   TUBAL LIGATION     ventrical atrium shunt  04/2018   VENTRICULOPERITONEAL SHUNT Right 11/19/2015   Procedure: VENTRICULAR-PERITONEAL SHUNT RIGHT;  Surgeon: Tressie Stalker, MD;  Location: [facility];  Service: Neurosurgery;  Laterality: Right;  Right    Current Outpatient Medications  Medication Sig Dispense Refill   atenolol (TENORMIN) 25 MG tablet Take 1 tablet (25 mg total) by mouth daily. 30 tablet 6   Azelastine HCl 137 MCG/SPRAY SOLN Place 2 puffs into both nostrils daily.     BREO ELLIPTA 100-25 MCG/INH AEPB Inhale 1 puff into the lungs daily.     butalbital-acetaminophen-caffeine (FIORICET, ESGIC) 50-325-40 MG tablet Take 1 tablet by mouth every 6 (six) hours as needed for headache. 20 tablet 0   cetirizine (ZYRTEC) 10 MG tablet Take 10 mg by  mouth daily.      Dextromethorphan-guaiFENesin ([place] DM PO) Take by mouth as needed.     diazepam (VALIUM) 5 MG tablet Take 5 mg by mouth every 6 (six) hours as needed for anxiety.     diphenhydrAMINE (BENADRYL) 25 mg capsule Take 25 mg by mouth every 6 (six) hours as needed for itching.     hydrochlorothiazide (HYDRODIURIL) 12.5 MG tablet Take 12.5 mg by mouth daily.   1   meclizine (ANTIVERT) 25 MG tablet Take 1 tablet (25 mg total) by mouth 3 (three) times daily as needed for dizziness. 30 tablet 1   pantoprazole (PROTONIX) 40 MG tablet Take 1 tablet (40 mg total) by mouth 2 (two) times daily. 60 tablet 5   PREDNISONE PO Take 80 mg by mouth daily.     pregabalin (LYRICA) 300 MG capsule Take 150 mg by mouth at bedtime.      rizatriptan (MAXALT-MLT) 5 MG disintegrating tablet Take 5 mg by mouth daily as needed for migraine. May repeat in 2 hours if needed     tetrahydrozoline (VISINE) 0.05 % ophthalmic solution Place 1 drop into both eyes as needed (For allergies).     tiZANidine (ZANAFLEX) 4 MG capsule  Take 4 mg by mouth as needed for muscle spasms.     lubiprostone (AMITIZA) 8 MCG capsule Take 1 capsule (8 mcg total) by mouth 2 (two) times daily with a meal. (Patient not taking: Reported on 03/16/2019) 60 capsule 5   No current facility-administered medications for this visit.    Allergies as of 03/16/2019 - Review Complete 03/16/2019  Allergen Reaction Noted   Ambien [zolpidem tartrate] Rash and Other (See Comments) 01/27/2013   Penicillins Hives and Swelling 01/05/2014   Topamax [topiramate] Other (See Comments) 01/10/2014   Chlorhexidine gluconate Itching and Rash 11/19/2015   Hydrocodone-acetaminophen Itching    Latex Rash 04/04/2015   Versed [midazolam] Itching 02/03/2014    Family History  Problem Relation Age of Onset   Kidney cancer Mother    Diabetes Mother    Hypertension Mother    Thyroid disease Mother    Ulcers Father    Coronary artery disease Father        Premature CAD   Hypertension Father    Prostate cancer Maternal Grandfather    Pancreatic cancer Maternal Grandmother    Lung cancer Paternal Grandmother    Aortic aneurysm Other    Colon cancer Neg Hx     Social History   Socioeconomic History   Marital status: Divorced    Spouse name: Jobey   Number of children: 2   Years of education: 12   Highest education level: Not on file  Occupational History    Comment: [employer]  Tobacco Use   Smoking status: Never Smoker   Smokeless tobacco: Never Used  Substance and Sexual Activity   Alcohol use: No    Alcohol/week: 0.0 standard drinks   Drug use: No   Sexual activity: Not on file  Other Topics Concern   Not on file  Social History Narrative   Patient lives at home with her children and she is se prated.   Patient is out of work at this time.   Education one year of college.   Caffeine one cup of sweet daily.   Social Determinants of Health   Financial Resource Strain:    Difficulty of Paying Living  Expenses: Not on file  Food Insecurity:    Worried About Charity fundraiser  in the Last Year: Not on file   Ran Out of Food in the Last Year: Not on file  Transportation Needs:    Lack of Transportation (Medical): Not on file   Lack of Transportation (Non-Medical): Not on file  Physical Activity:    Days of Exercise per Week: Not on file   Minutes of Exercise per Session: Not on file  Stress:    Feeling of Stress : Not on file  Social Connections:    Frequency of Communication with Friends and Family: Not on file   Frequency of Social Gatherings with Friends and Family: Not on file   Attends Religious Services: Not on file   Active Member of Clubs or Organizations: Not on file   Attends Banker Meetings: Not on file   Marital Status: Not on file    Review of Systems: Gen: See HPI CV: See HPI Resp: See HPI GI: See HPI. Derm: Denies rash Heme: See HPI  Physical Exam: BP (!) 90/56    Pulse 62    Temp (!) 96.9 F (36.1 C) (Oral)    Ht 5\' 6"  (1.676 m)    Wt 284 lb 3.2 oz (128.9 kg)    BMI 45.87 kg/m  General:   Alert and oriented.  Diaphoretic.  Walking with a walker. Head:  Normocephalic and atraumatic. Eyes:  Conjuctiva clear without scleral icterus. Mouth: Dry mucous membranes. Heart:  S1, S2 present without murmurs appreciated. Lungs:  Clear to auscultation bilaterally. No wheezes, rales, or rhonchi. No distress.  Abdomen:  +BS, soft, and non-distended.  Moderate to severe tenderness to palpation in the mid and lower abdomen.  Most severe on the right side.  No rebound or guarding. No masses noted. Msk:  Symmetrical without gross deformities.  Extremities:  Without edema. Neurologic:  Alert and  oriented x4 Psych:  Normal mood and affect.

## 2019-03-16 NOTE — Discharge Instructions (Signed)
Follow up with your Gi doctor as scheduled.  Continue tylenol for pain.

## 2019-03-16 NOTE — Patient Instructions (Signed)
Please proceed to [facility] emergency department.  We will follow up with you in 4 weeks to regroup.  Ermalinda Memos, PA-C [facility]

## 2019-03-16 NOTE — ED Triage Notes (Signed)
Pt has been having rectal bleeding since last night. No history of any GI issues. Is having intense abdominal pain as well. Pt is scheduled to have back surgery in 2 weeks.

## 2019-03-17 ENCOUNTER — Other Ambulatory Visit: Payer: Self-pay

## 2019-03-17 ENCOUNTER — Telehealth: Payer: Self-pay | Admitting: Internal Medicine

## 2019-03-17 ENCOUNTER — Other Ambulatory Visit: Payer: Self-pay | Admitting: Gastroenterology

## 2019-03-17 DIAGNOSIS — R197 Diarrhea, unspecified: Secondary | ICD-10-CM

## 2019-03-17 DIAGNOSIS — Z79899 Other long term (current) drug therapy: Secondary | ICD-10-CM

## 2019-03-17 DIAGNOSIS — K625 Hemorrhage of anus and rectum: Secondary | ICD-10-CM

## 2019-03-17 MED ORDER — HYDROCORTISONE (PERIANAL) 2.5 % EX CREA: 1.0000 "application " | TOPICAL_CREAM | Freq: Two times a day (BID) | CUTANEOUS | 1 refills | Status: AC

## 2019-03-17 NOTE — Telephone Encounter (Signed)
Spoke with pt. Pt went to the ED yesterday after her apt as directed. PT has her CT scan done yesterday. Pt reports abdominal pain around her navel. Pt says the sharp abdominal pain feels like menstral cramps. Pt has had 10 bloody stools since her ED visit yesterday. Pt said she was told that she, need a TCS. Pt wasn't able to get her RX (HC suppository) prescribed due to the cost being $100.00. GoodRX coupon is $41-75.00 and pt isn't able to pay for it. Please advise.

## 2019-03-17 NOTE — Telephone Encounter (Signed)
Routing to [facility]. Pt will submit stool studies soon.

## 2019-03-17 NOTE — Telephone Encounter (Signed)
Spoke with pt. Pt is aware of the Stool studies that are needed and the [facility] cream. Pt wanted to mention to [facility], that she has her back surgery scheduled for early February. Pt will not be able to be in the office for a while after her surgery. Pt is concerned with the bleeding and will start the cream prescribed. Pt didn't know if there was an emergency TCS that could be done.

## 2019-03-17 NOTE — Telephone Encounter (Signed)
Noted. Pt is scheduled.

## 2019-03-17 NOTE — Telephone Encounter (Signed)
Note. Ok to proceed with telephone visit.

## 2019-03-17 NOTE — Telephone Encounter (Signed)
Reviewed CT and laboratory findings.  CT unrevealing.  Hemoglobin normal.  I will send in a prescription for Anusol cream as this is typically well covered compared to the suppositories.  She should use this twice daily for the next 7-10 days.  If rectal bleeding continues to persist or if she has recurrence of lightheadedness, dizziness, feeling like she will pass out, significant fatigue, she should return to the emergency department.  As she is continuing to have diarrhea and abdominal pain, I would like for her to complete stool studies.  Please arrange for C. difficile and GI pathogen panel.   We will need to see her back in the office to arrange her procedures as this was not discussed due to acute visit. She is currently scheduled for 2/18. Lets start the anusol cream and get the stool studies completed and go from there. May need to move OV up.

## 2019-03-17 NOTE — Telephone Encounter (Signed)
[phone] please call patient, she was here yesterday and was sent to the ER.Marland KitchenMarland KitchenShe was supposed to call today with an update

## 2019-03-17 NOTE — Telephone Encounter (Signed)
Noted, spoke with pt and she is aware. Schedule apt for next Thursday. Pt wants it to be a telephone visit if possible. If pt needs to come in office, she will make accommodations.

## 2019-03-17 NOTE — Telephone Encounter (Signed)
I understand her concerns.  It is reassuring that her hemoglobin was normal yesterday.  She needs to start the [facility] cream and complete stool studies ASAP.  I need to rule out an infectious diarrhea that may be contributing to bleeding.  Is also possible that she has bleeding secondary to hemorrhoids.  Unfortunately, we do not have procedures available until March.  We need to complete these first steps and go from there.  If stool studies are unrevealing and she continues with persistent bleeding despite use of HC cream, we may try to pull some strings to get colonoscopy done sooner but I can't guarantee this. Lets move her office visit up to Thursday of next week. Again, if rectal bleeding is profuse and persist or she has recurrence of lightheadedness, dizziness, feeling that she will pass out, significant fatigue, she should proceed to the emergency department.

## 2019-03-24 ENCOUNTER — Ambulatory Visit: Payer: Medicare Other | Admitting: Gastroenterology

## 2019-03-28 DIAGNOSIS — M5416 Radiculopathy, lumbar region: Secondary | ICD-10-CM | POA: Diagnosis not present

## 2019-03-28 DIAGNOSIS — Z01812 Encounter for preprocedural laboratory examination: Secondary | ICD-10-CM | POA: Diagnosis not present

## 2019-03-28 DIAGNOSIS — M4150 Other secondary scoliosis, site unspecified: Secondary | ICD-10-CM | POA: Diagnosis not present

## 2019-03-28 DIAGNOSIS — Z20822 Contact with and (suspected) exposure to covid-19: Secondary | ICD-10-CM | POA: Diagnosis not present

## 2019-03-28 DIAGNOSIS — R29898 Other symptoms and signs involving the musculoskeletal system: Secondary | ICD-10-CM | POA: Diagnosis not present

## 2019-03-28 DIAGNOSIS — M4316 Spondylolisthesis, lumbar region: Secondary | ICD-10-CM | POA: Diagnosis not present

## 2019-04-04 DIAGNOSIS — I10 Essential (primary) hypertension: Secondary | ICD-10-CM | POA: Diagnosis present

## 2019-04-04 DIAGNOSIS — M4316 Spondylolisthesis, lumbar region: Secondary | ICD-10-CM | POA: Diagnosis present

## 2019-04-04 DIAGNOSIS — Z6841 Body Mass Index (BMI) 40.0 and over, adult: Secondary | ICD-10-CM | POA: Diagnosis not present

## 2019-04-04 DIAGNOSIS — M5412 Radiculopathy, cervical region: Secondary | ICD-10-CM | POA: Diagnosis present

## 2019-04-04 DIAGNOSIS — Z9049 Acquired absence of other specified parts of digestive tract: Secondary | ICD-10-CM | POA: Diagnosis not present

## 2019-04-04 DIAGNOSIS — M4186 Other forms of scoliosis, lumbar region: Secondary | ICD-10-CM | POA: Diagnosis present

## 2019-04-04 DIAGNOSIS — M5116 Intervertebral disc disorders with radiculopathy, lumbar region: Secondary | ICD-10-CM | POA: Diagnosis present

## 2019-04-04 DIAGNOSIS — M48061 Spinal stenosis, lumbar region without neurogenic claudication: Secondary | ICD-10-CM | POA: Diagnosis present

## 2019-04-04 DIAGNOSIS — M5416 Radiculopathy, lumbar region: Secondary | ICD-10-CM | POA: Diagnosis not present

## 2019-04-04 DIAGNOSIS — M4726 Other spondylosis with radiculopathy, lumbar region: Secondary | ICD-10-CM | POA: Diagnosis present

## 2019-04-04 DIAGNOSIS — G4733 Obstructive sleep apnea (adult) (pediatric): Secondary | ICD-10-CM | POA: Diagnosis present

## 2019-04-04 DIAGNOSIS — M797 Fibromyalgia: Secondary | ICD-10-CM | POA: Diagnosis present

## 2019-04-04 DIAGNOSIS — Z90710 Acquired absence of both cervix and uterus: Secondary | ICD-10-CM | POA: Diagnosis not present

## 2019-04-06 DIAGNOSIS — M4316 Spondylolisthesis, lumbar region: Secondary | ICD-10-CM | POA: Diagnosis not present

## 2019-04-14 ENCOUNTER — Ambulatory Visit: Payer: Medicare Other | Admitting: Gastroenterology

## 2019-04-19 DIAGNOSIS — Z981 Arthrodesis status: Secondary | ICD-10-CM | POA: Diagnosis not present

## 2019-04-19 DIAGNOSIS — Z4789 Encounter for other orthopedic aftercare: Secondary | ICD-10-CM | POA: Diagnosis not present

## 2019-05-02 DIAGNOSIS — M47816 Spondylosis without myelopathy or radiculopathy, lumbar region: Secondary | ICD-10-CM | POA: Diagnosis not present

## 2019-05-02 DIAGNOSIS — Z79891 Long term (current) use of opiate analgesic: Secondary | ICD-10-CM | POA: Diagnosis not present

## 2019-05-02 DIAGNOSIS — Z981 Arthrodesis status: Secondary | ICD-10-CM | POA: Diagnosis not present

## 2019-05-16 DIAGNOSIS — R42 Dizziness and giddiness: Secondary | ICD-10-CM | POA: Diagnosis not present

## 2019-05-16 DIAGNOSIS — J45909 Unspecified asthma, uncomplicated: Secondary | ICD-10-CM | POA: Diagnosis not present

## 2019-05-16 DIAGNOSIS — M797 Fibromyalgia: Secondary | ICD-10-CM | POA: Diagnosis not present

## 2019-05-16 DIAGNOSIS — Z299 Encounter for prophylactic measures, unspecified: Secondary | ICD-10-CM | POA: Diagnosis not present

## 2019-05-30 ENCOUNTER — Encounter: Payer: Self-pay | Admitting: Cardiology

## 2019-05-30 ENCOUNTER — Ambulatory Visit: Payer: Medicare Other | Admitting: Cardiology

## 2019-05-30 NOTE — Progress Notes (Deleted)
Cardiology Office Note  Date: 05/30/2019   ID: Sandra [name], DOB [birth year], MRN [id number]  PCP:  Glenda Chroman, MD  Cardiologist:  Rozann Lesches, MD Electrophysiologist:  None   No chief complaint on file.   History of Present Illness: Sandra [name] is a 44 y.o. female last seen in December 2020.  Past Medical History:  Diagnosis Date  . Anxiety   . Arthritis   . Chronic back pain   . Chronic migraine   . Constipation   . Depression   . Fibromyalgia   . GERD (gastroesophageal reflux disease)   . History of bronchitis   . History of cardiac catheterization    Normal coronaries 2007  . History of gastric ulcer   . History of hiatal hernia   . History of pneumonia   . Hypertension   . Idiopathic intracranial hypertension   . POTS (postural orthostatic tachycardia syndrome)   . Seasonal allergies     Past Surgical History:  Procedure Laterality Date  . ABDOMINAL HYSTERECTOMY    . BACK SURGERY    . BLADDER SURGERY    . BREAST SURGERY Left    Lumpectomy  . CARDIAC CATHETERIZATION  2010  . CHOLECYSTECTOMY    . COLONOSCOPY  2003   Moderate external hemorrhoids with normal TI  . CYSTOSCOPY W/ URETERAL STENT REMOVAL    . ESOPHAGEAL DILATION     4-5 times  . ESOPHAGOGASTRODUODENOSCOPY  2003   Small sliding hiatal hernia, erosive gastritis  . ESOPHAGOGASTRODUODENOSCOPY  2004   Erosive reflux esophagitis with Schatzki ring s/p dilation, erosive antral gastritis  . ESOPHAGOGASTRODUODENOSCOPY  2006   Non-critical Schatzki ring proximal to GEJ, s/p 54 and 56 Maloney dilatation. ?Candida esophagitis  . ESOPHAGOGASTRODUODENOSCOPY  2009   Small hiatal hernia  . LUMBAR LAMINECTOMY/DECOMPRESSION MICRODISCECTOMY Left 01/11/2014   Procedure: LUMBAR LAMINECTOMY/DECOMPRESSION MICRODISCECTOMY 1 LEVEL LEFT LUMBAR TWO/THREE;  Surgeon: Newman Pies, MD;  Location: [place] [facility]UR[facility] ORS;  Service: Neurosurgery;  Laterality: Left;  . TUBAL LIGATION    . ventrical atrium shunt   04/2018  . VENTRICULOPERITONEAL SHUNT Right 11/19/2015   Procedure: VENTRICULAR-PERITONEAL SHUNT RIGHT;  Surgeon: Newman Pies, MD;  Location: [place] NEURO ORS;  Service: Neurosurgery;  Laterality: Right;  Right    Current Outpatient Medications  Medication Sig Dispense Refill  . atenolol (TENORMIN) 25 MG tablet Take 1 tablet (25 mg total) by mouth daily. 30 tablet 6  . Azelastine HCl 137 MCG/SPRAY SOLN Place 2 puffs into both nostrils daily.    Marland Kitchen BREO ELLIPTA 100-25 MCG/INH AEPB Inhale 1 puff into the lungs daily.    . butalbital-acetaminophen-caffeine (FIORICET, ESGIC) 50-325-40 MG tablet Take 1 tablet by mouth every 6 (six) hours as needed for headache. 20 tablet 0  . cetirizine (ZYRTEC) 10 MG tablet Take 10 mg by mouth daily.     Marland Kitchen Dextromethorphan-guaiFENesin (Rochelle DM PO) Take by mouth as needed.    . diazepam (VALIUM) 5 MG tablet Take 5 mg by mouth every 6 (six) hours as needed for anxiety.    . diphenhydrAMINE (BENADRYL) 25 mg capsule Take 25 mg by mouth every 6 (six) hours as needed for itching.    . hydrochlorothiazide (HYDRODIURIL) 12.5 MG tablet Take 12.5 mg by mouth daily.   1  . hydrocortisone (ANUSOL-HC) 2.5 % rectal cream Place 1 application rectally 2 (two) times daily. 30 g 1  . hydrocortisone (ANUSOL-HC) 25 MG suppository Place 1 suppository (25 mg total) rectally every 12 (twelve) hours. 12 suppository 1  .  lubiprostone (AMITIZA) 8 MCG capsule Take 1 capsule (8 mcg total) by mouth 2 (two) times daily with a meal. (Patient not taking: Reported on 03/16/2019) 60 capsule 5  . meclizine (ANTIVERT) 25 MG tablet Take 1 tablet (25 mg total) by mouth 3 (three) times daily as needed for dizziness. 30 tablet 1  . pantoprazole (PROTONIX) 40 MG tablet Take 1 tablet (40 mg total) by mouth 2 (two) times daily. 60 tablet 5  . PREDNISONE PO Take 80 mg by mouth daily.    . pregabalin (LYRICA) 300 MG capsule Take 150 mg by mouth at bedtime.     . rizatriptan (MAXALT-MLT) 5 MG disintegrating  tablet Take 5 mg by mouth daily as needed for migraine. May repeat in 2 hours if needed    . tetrahydrozoline (VISINE) 0.05 % ophthalmic solution Place 1 drop into both eyes as needed (For allergies).    Marland Kitchen tiZANidine (ZANAFLEX) 4 MG capsule Take 4 mg by mouth as needed for muscle spasms.     No current facility-administered medications for this visit.   Allergies:  Ambien [zolpidem tartrate], Penicillins, Topamax [topiramate], Chlorhexidine gluconate, Hydrocodone-acetaminophen, Latex, and Versed [midazolam]   Social History: The patient  reports that she has never smoked. She has never used smokeless tobacco. She reports that she does not drink alcohol or use drugs.   Family History: The patient's family history includes Aortic aneurysm in an other family member; Coronary artery disease in her father; Diabetes in her mother; Hypertension in her father and mother; Kidney cancer in her mother; Lung cancer in her paternal grandmother; Pancreatic cancer in her maternal grandmother; Prostate cancer in her maternal grandfather; Thyroid disease in her mother; Ulcers in her father.   ROS:  Please see the history of present illness. Otherwise, complete review of systems is positive for {NONE DEFAULTED:18576::"none"}.  All other systems are reviewed and negative.   Physical Exam: VS:  There were no vitals taken for this visit., BMI There is no height or weight on file to calculate BMI.  Wt Readings from Last 3 Encounters:  03/16/19 280 lb (127 kg)  03/16/19 284 lb 3.2 oz (128.9 kg)  01/28/19 281 lb 9.6 oz (127.7 kg)    General: Patient appears comfortable at rest. HEENT: Conjunctiva and lids normal, oropharynx clear with moist mucosa. Neck: Supple, no elevated JVP or carotid bruits, no thyromegaly. Lungs: Clear to auscultation, nonlabored breathing at rest. Cardiac: Regular rate and rhythm, no S3 or significant systolic murmur, no pericardial rub. Abdomen: Soft, nontender, no hepatomegaly, bowel  sounds present, no guarding or rebound. Extremities: No pitting edema, distal pulses 2+. Skin: Warm and dry. Musculoskeletal: No kyphosis. Neuropsychiatric: Alert and oriented x3, affect grossly appropriate.  ECG:  An ECG dated 11/26/2018 was personally reviewed today and demonstrated:  Normal sinus rhythm.  Recent Labwork: 03/16/2019: ALT 13; AST 19; BUN 22; Creatinine, Ser 1.37; Hemoglobin 13.3; Platelets 200; Potassium 3.9; Sodium 134   Other Studies Reviewed Today:  Echocardiogram 04/04/2015: Study Conclusions  - Left ventricle: The cavity size was normal. Wall thickness was normal. Systolic function was normal. The estimated ejection fraction was in the range of 60% to 65%. Wall motion was normal; there were no regional wall motion abnormalities. Left ventricular diastolic function parameters were normal. - Tricuspid valve: There was mild regurgitation. - Pulmonary arteries: PA peak pressure: 27 mm Hg (S). - Systemic veins: Dilated IVC with normal respiratory variation. - CVP 8 mmHg.  Assessment and Plan:    Medication Adjustments/Labs and Tests  Ordered: Current medicines are reviewed at length with the patient today.  Concerns regarding medicines are outlined above.   Tests Ordered: No orders of the defined types were placed in this encounter.   Medication Changes: No orders of the defined types were placed in this encounter.   Disposition:  Follow up {follow up:15908}  Signed, Jonelle Sidle, MD, [facility] 05/30/2019 12:58 PM    [facility] at [facility] [address] [place], [place], [place] [address] Phone: [phone]; Fax: [phone]

## 2019-06-21 DIAGNOSIS — J302 Other seasonal allergic rhinitis: Secondary | ICD-10-CM | POA: Diagnosis not present

## 2019-06-21 DIAGNOSIS — M797 Fibromyalgia: Secondary | ICD-10-CM | POA: Diagnosis not present

## 2019-06-21 DIAGNOSIS — Z299 Encounter for prophylactic measures, unspecified: Secondary | ICD-10-CM | POA: Diagnosis not present

## 2019-06-21 DIAGNOSIS — J019 Acute sinusitis, unspecified: Secondary | ICD-10-CM | POA: Diagnosis not present

## 2019-06-23 IMAGING — CT CT ABD-PELV W/ CM
2 of 5 series · 15 of 46 positions shown, 17 images · IV contrast (Isovue)
Comparison: 07/07/2017, 12/16/2011

CLINICAL DATA: Dizziness with diarrhea x2 weeks. Nausea and
vomiting with fever. History of pseudotumor cerebri with VP shunt
8721.

EXAM:
CT ABDOMEN AND PELVIS WITH CONTRAST
TECHNIQUE: Multidetector CT imaging of the abdomen and pelvis was performed
using the standard protocol following bolus administration of
intravenous contrast.
CONTRAST:  100mL [id number] IOPAMIDOL ([id number]) INJECTION 61%

[Series 2: axial st · axial · 0.88mm/px · z∈[+206,+686]mm · 12 of 110 slices shown, 14 images]
[im 7/110  soft-tissue]
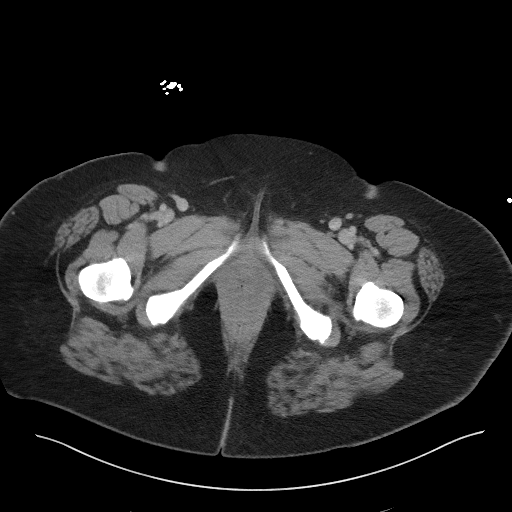
[im 7/110  bone]
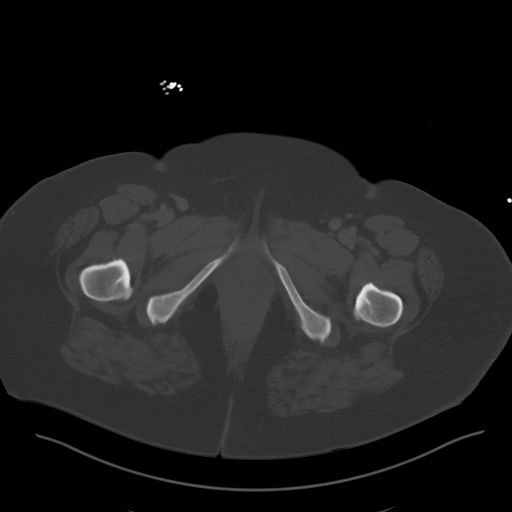
[im 20/110  soft-tissue]
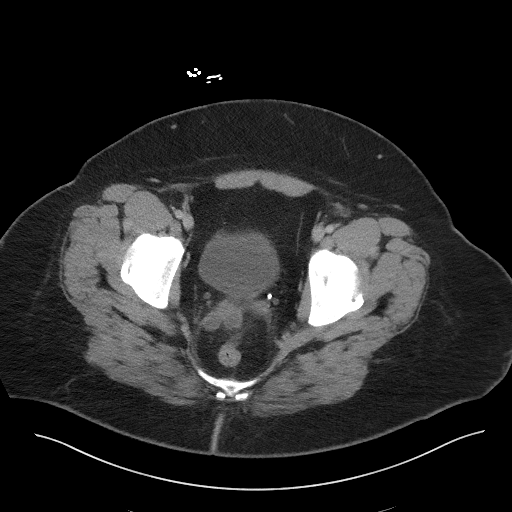
[im 26/110  soft-tissue]
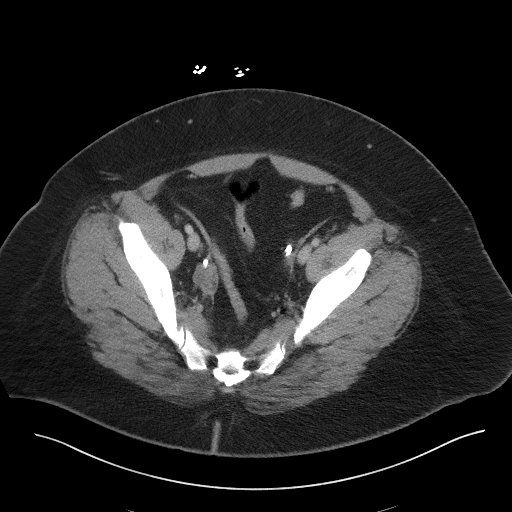
[im 33/110  soft-tissue]
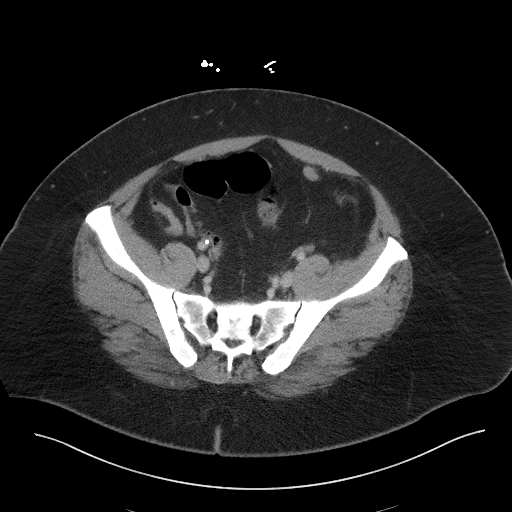
[im 45/110  soft-tissue]
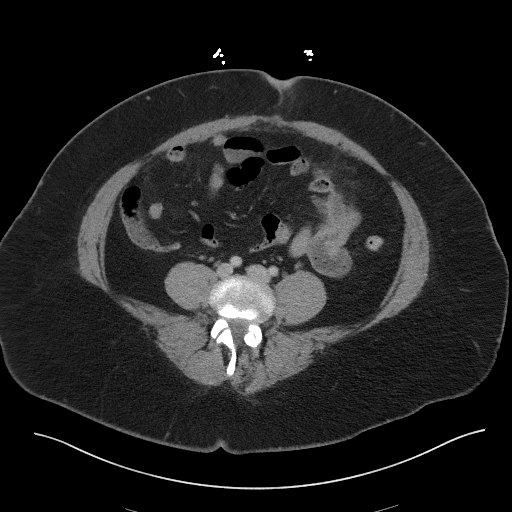
[im 52/110  soft-tissue]
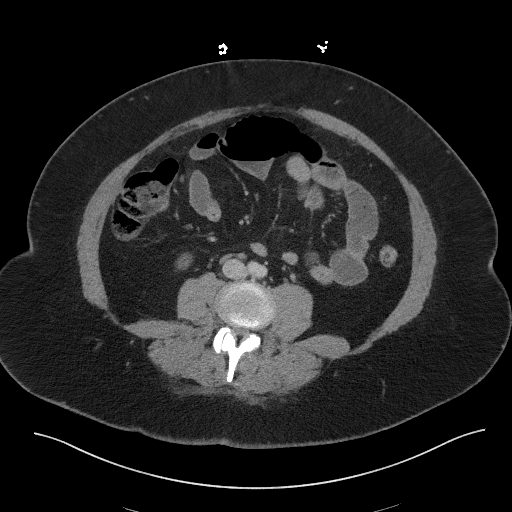
[im 58/110  soft-tissue]
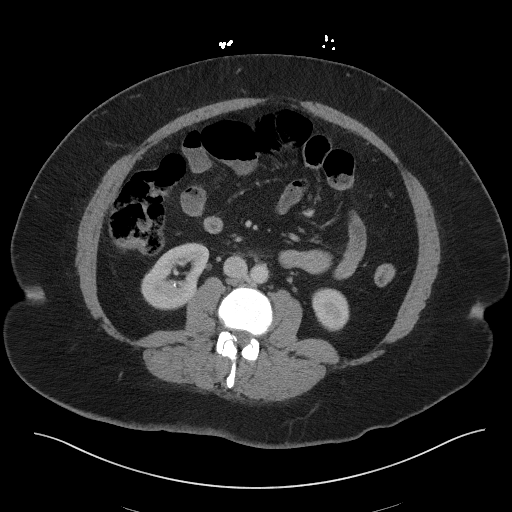
[im 71/110  soft-tissue]
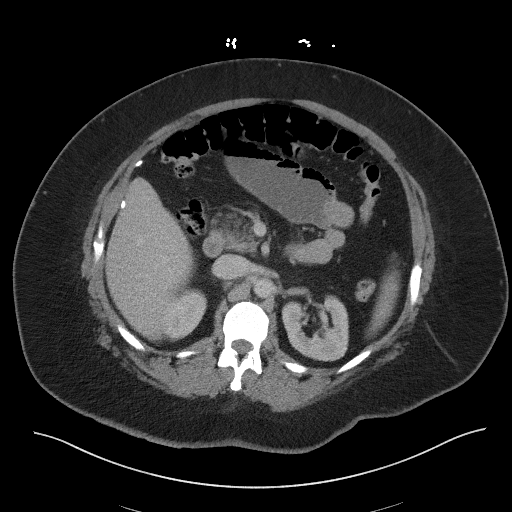
[im 77/110  soft-tissue]
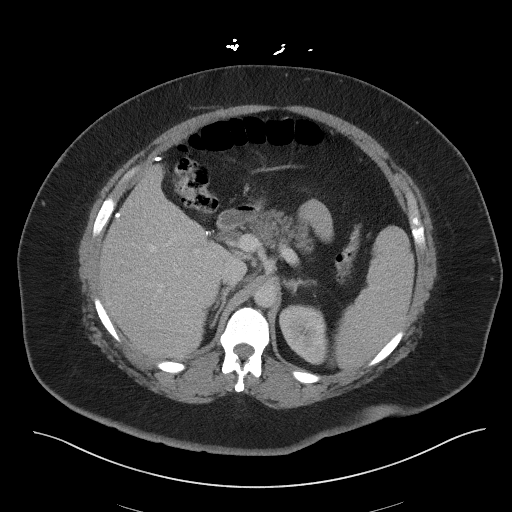
[im 77/110  bone]
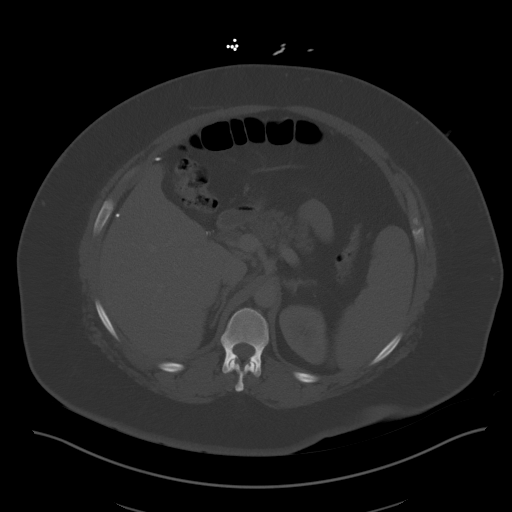
[im 84/110  soft-tissue]
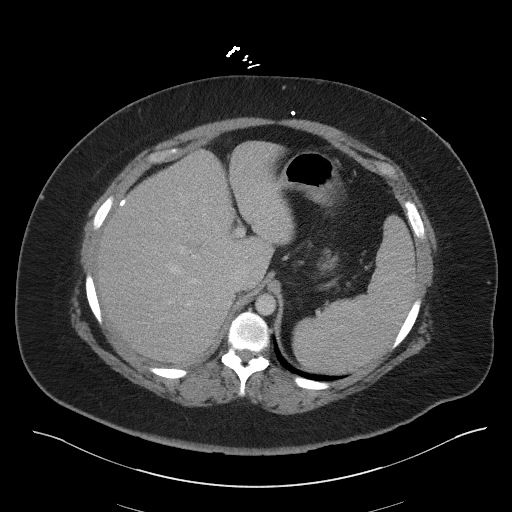
[im 97/110  soft-tissue]
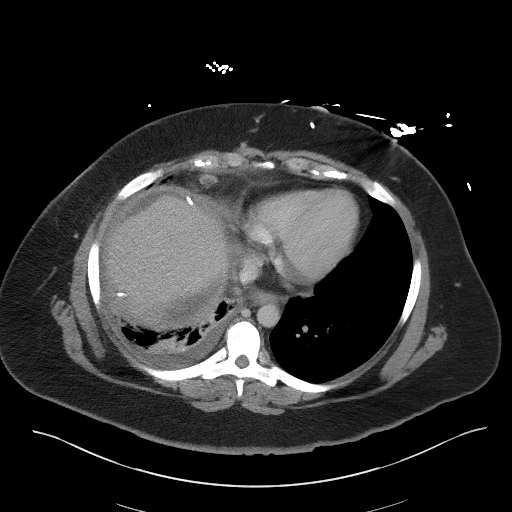
[im 103/110  soft-tissue]
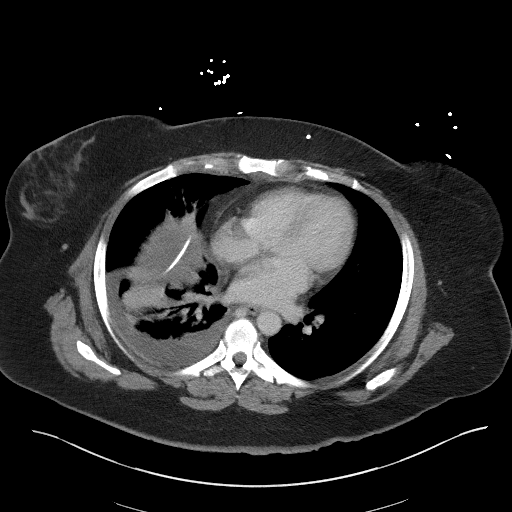

[Series 6: coronal st · coronal · 0.73mm/px · 3 of 117 slices shown]
[im 39/117  soft-tissue]
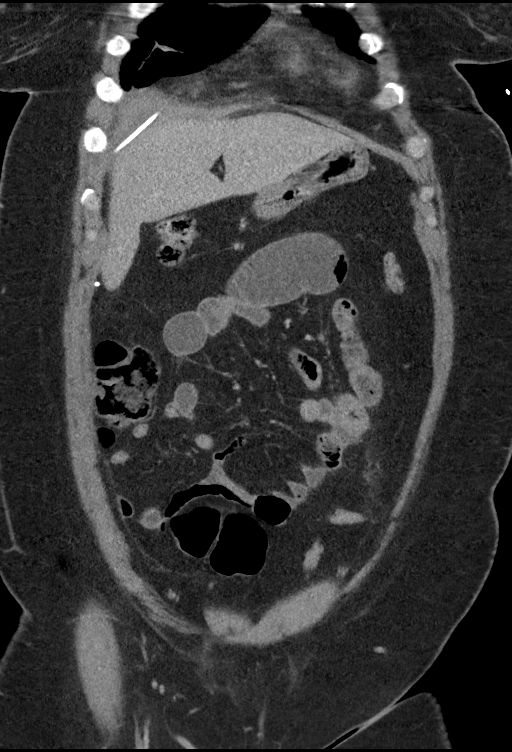
[im 52/117  soft-tissue]
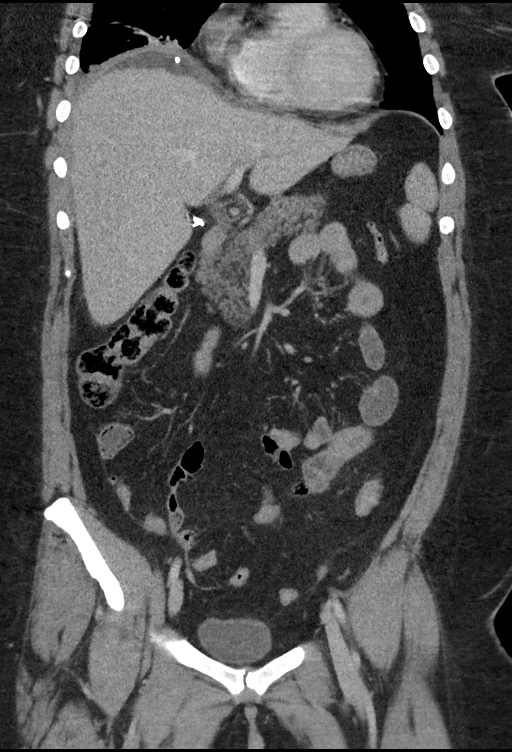
[im 65/117  soft-tissue]
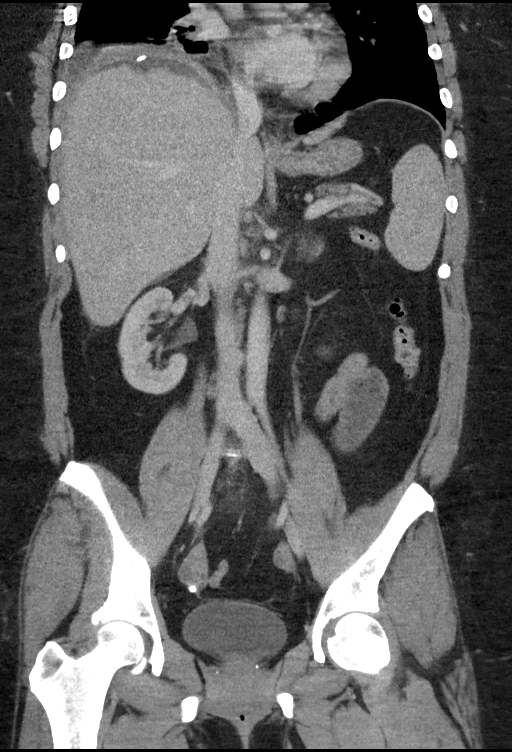

[15 of 46 positions shown; findings below may reference images not displayed]

FINDINGS: Lower chest: New small right pleural effusion with atelectasis in
the right middle and lower lobes. Left lung base is clear. Heart
size is top normal without pericardial effusion or thickening.

Hepatobiliary: No space-occupying mass of the liver. The patient is
status post cholecystectomy. No biliary dilatation is seen.
Ventriculoperitoneal catheter is noted along the surface of the
right hepatic lobe. Small amount of perihepatic fluid is noted,
overlying the dome of the liver.

Pancreas: Normal

Spleen: Normal in size without focal abnormality.

Adrenals/Urinary Tract: Normal bilateral adrenal glands. 4 mm
nonobstructing right lower pole renal calculus. No
hydroureteronephrosis. Small exophytic 6 mm hypodensity off the
interpolar aspect of the left kidney is compatible with a cyst
possibly complex with thin internal septations. This is too small to
further characterize. It appears relatively stable in size since
1147 and is compatible with a benign finding. The urinary bladder is
unremarkable.

Stomach/Bowel: Decompressed stomach. Moderate new fluid-filled
distention of jejunal loops in the mid abdomen up to 4 cm in caliber
associated leading up to an area of segmental mural thickening of
small bowel suspicious for new inflammatory change and enteritis.
This may be contributing to early or partial SBO. Normal-appearing
appendix. The colon is unremarkable.

Vascular/Lymphatic: No significant vascular findings are present. No
enlarged abdominal or pelvic lymph nodes.

Reproductive: Status post hysterectomy. No adnexal masses. Tubal
ligation clips are present bilaterally.

Other: No free air.  Trace free fluid in the pelvis.

Musculoskeletal: No acute or significant osseous findings.
IMPRESSION: 1. New small right pleural effusion with right middle and lower lobe
atelectasis.
2. New segmental area of transmural thickening and inflammation
involving jejunum identified in the left hemiabdomen with dilatation
of more proximal small intestine up to 4 cm. Findings may represent
stigmata of small bowel enteritis, series [DATE]. Early or partial SBO
is not entirely excluded due to the thickening and inflammation.
3. New small volume of fluid near the dome of the diaphragm
associated with the VP shunt.
4. Stable too small to characterize cyst in the left lower pole of
the kidney measuring 6 mm.

## 2019-06-23 IMAGING — CT CT HEAD W/O CM
3 series · 15 of 47 positions shown, 18 images · non-contrast
Comparison: 11/20/2015

CLINICAL DATA: Dizziness, nausea and vomiting. History of
pseudotumor cerebri with VP shunt in 5877.

EXAM:
CT HEAD WITHOUT CONTRAST
TECHNIQUE: Contiguous axial images were obtained from the base of the skull
through the vertex without intravenous contrast.

[Series 3: head wo · axial · 0.42mm/px · z∈[+960,+1085]mm · 9 of 30 slices shown, 12 images]
[im 3/30  brain]
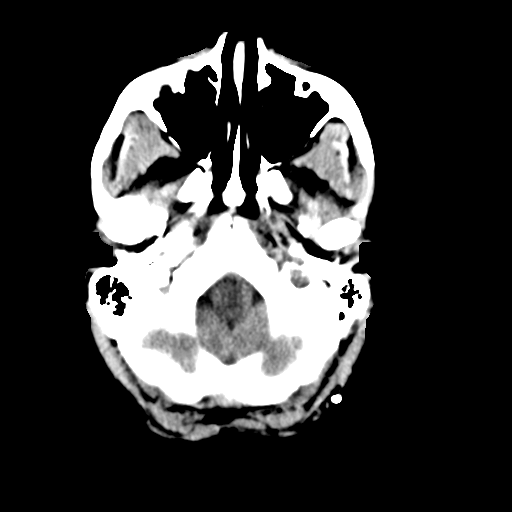
[im 3/30  bone]
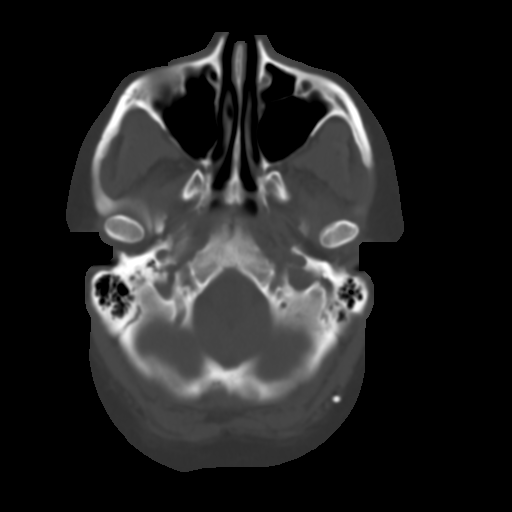
[im 6/30  brain]
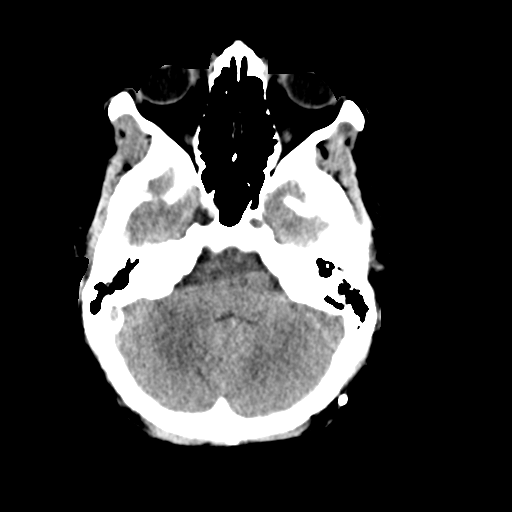
[im 9/30  brain]
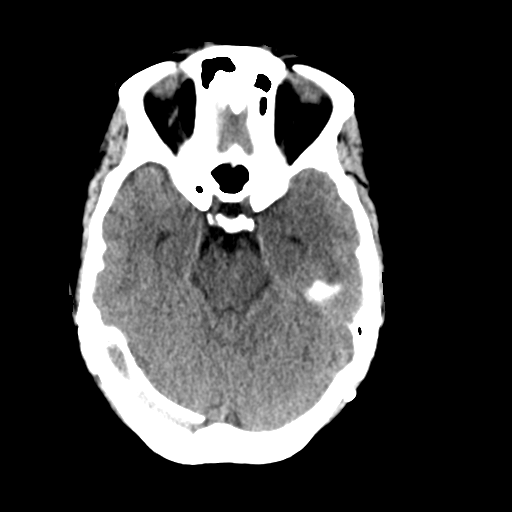
[im 12/30  brain]
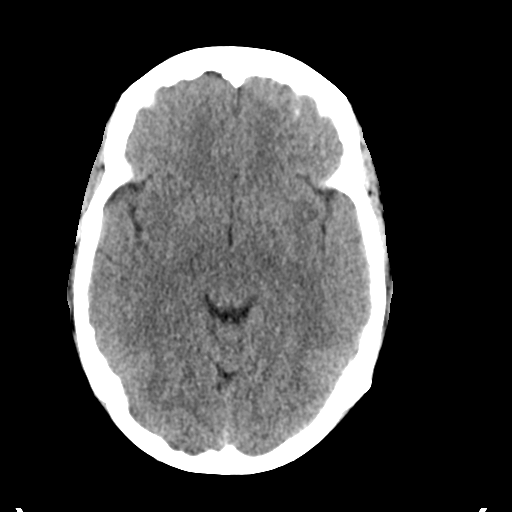
[im 16/30  brain]
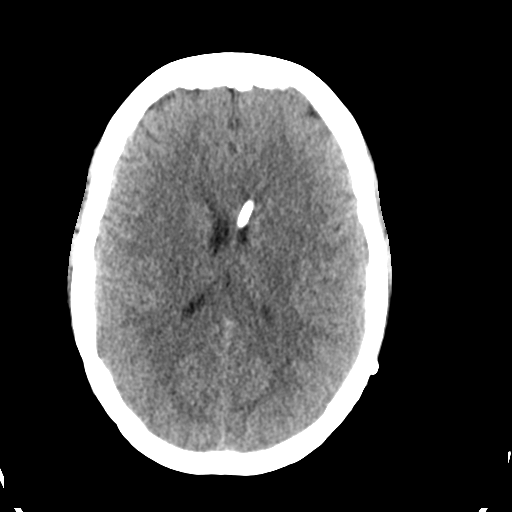
[im 16/30  bone]
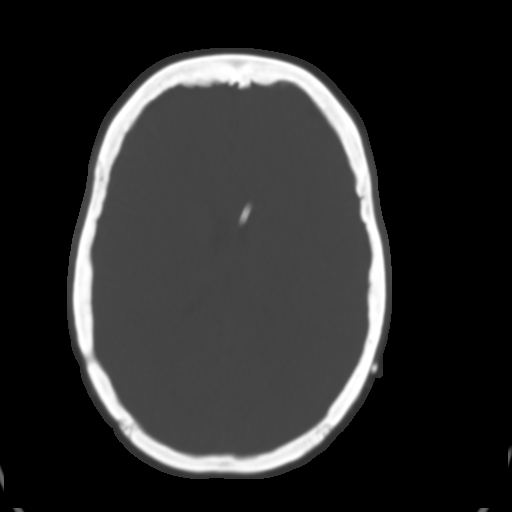
[im 19/30  brain]
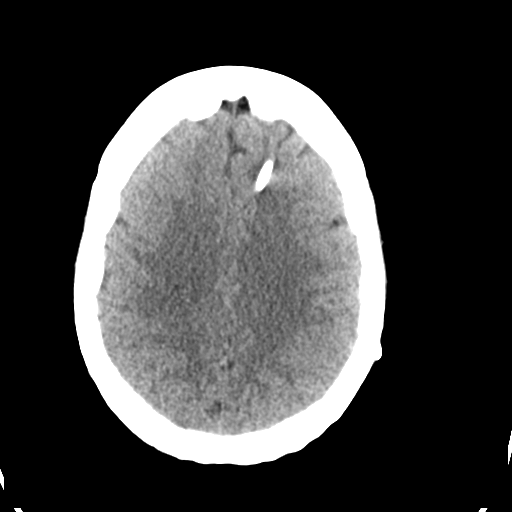
[im 22/30  brain]
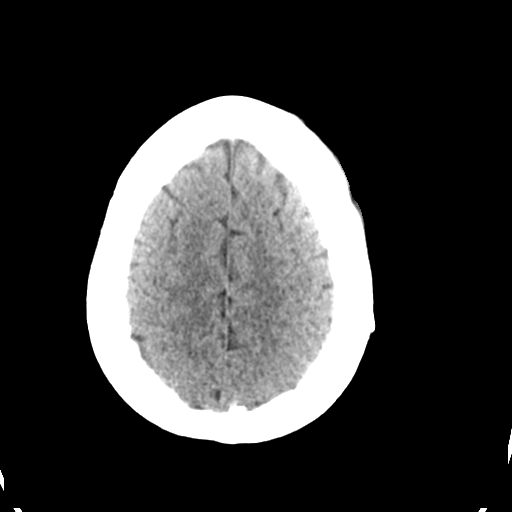
[im 25/30  brain]
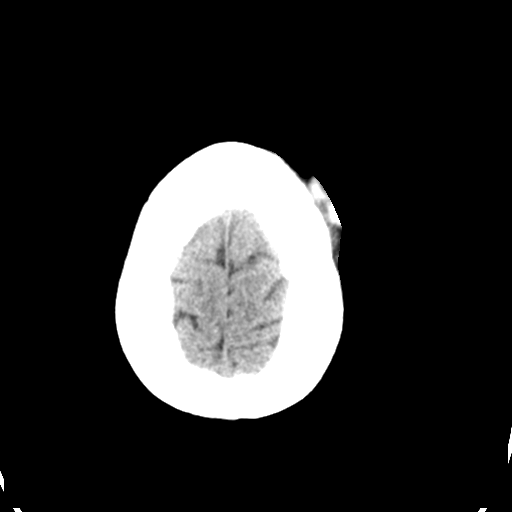
[im 28/30  brain]
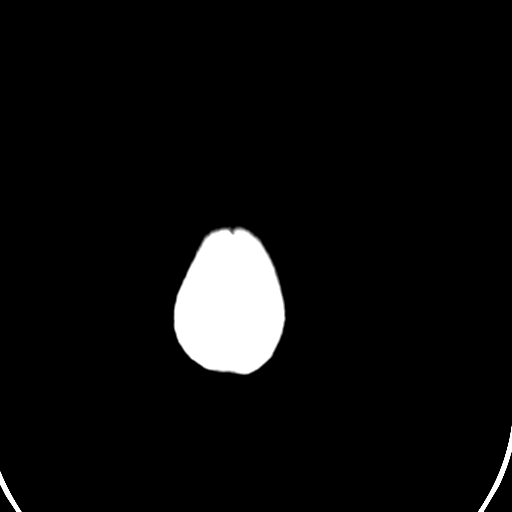
[im 28/30  bone]
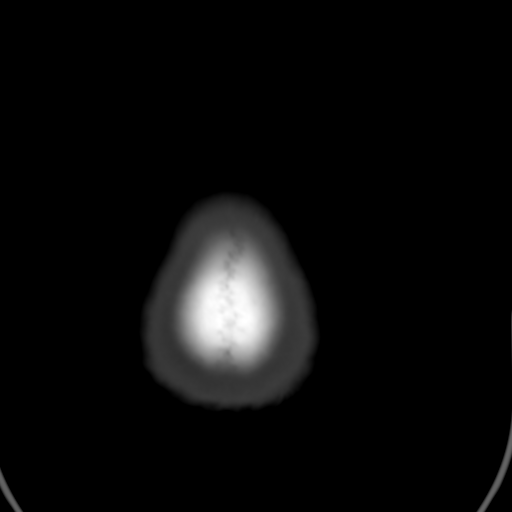

[Series 5: coronal soft tissue · coronal · 0.29mm/px · 3 of 67 slices shown]
[im 23/67  brain]
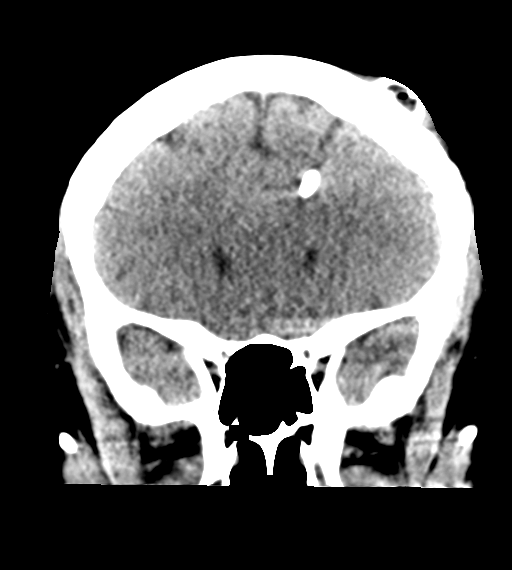
[im 30/67  brain]
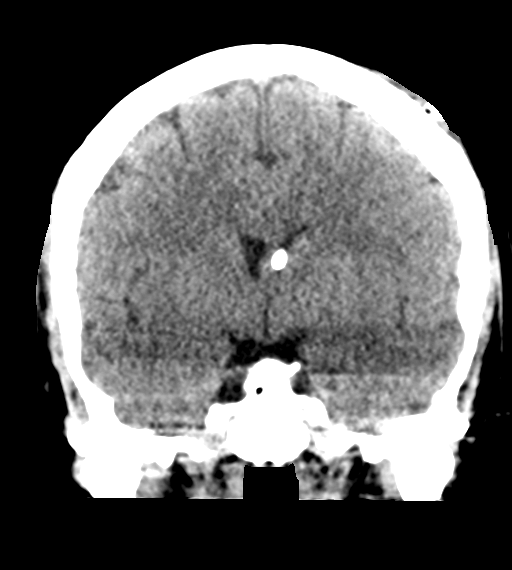
[im 37/67  brain]
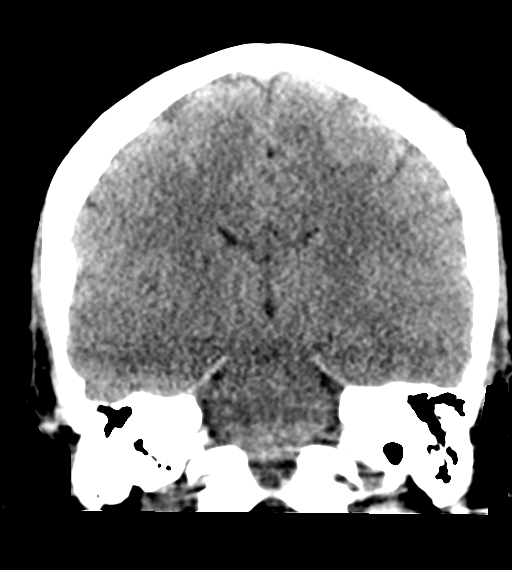

[Series 6: sagittal soft tissue · sagittal · 0.30mm/px · 3 of 53 slices shown]
[im 18/53  brain]
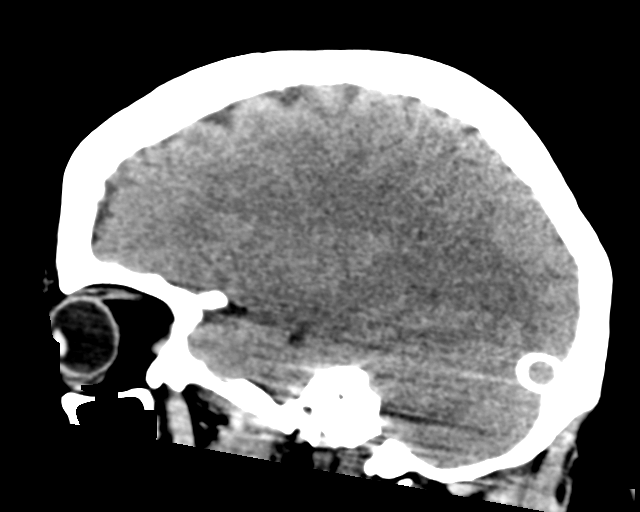
[im 27/53  brain]
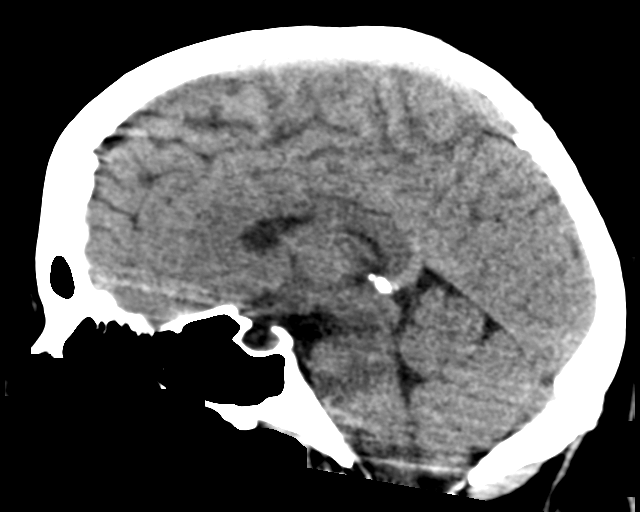
[im 35/53  brain]
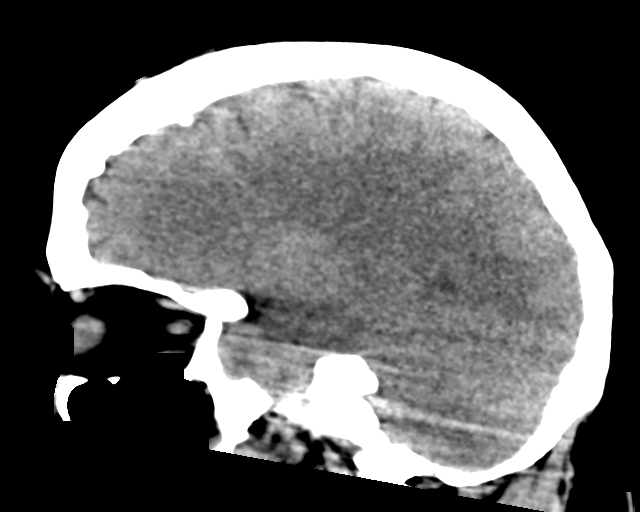

[15 of 47 positions shown; findings below may reference images not displayed]

FINDINGS: Brain: Right transverse sinus stent, new since prior exam is noted
presumably for the patient's history of pseudotumor cerebri.
Left-sided this posterior parietal VP shunt has been removed and
there is a new left anterior approach VP shunt with tip in the left
anterolateral ventricle. No ventriculomegaly is identified. No acute
intracranial hemorrhage, large vascular territory infarct, midline
shift or edema. No intra-axial mass nor extra-axial fluid
collections. Midline fourth ventricle and basal cisterns without
effacement.

Vascular: No hyperdense vessel sign.

Skull: No acute skull fracture or suspicious osseous lesions.

Sinuses/Orbits: Intact orbits and globes.

Other: None
IMPRESSION: 1. New left anterior approach VP shunt with tip in the left body of
the lateral ventricle. No ventriculomegaly is identified.
2. New right transverse sinus stent for presumed the patient's
intracranial hypertension.
3. No acute intracranial abnormality is identified.

## 2019-06-23 IMAGING — DX DG CHEST 2V
2 series · 2 of 2 positions shown · non-contrast
Comparison: None.

CLINICAL DATA: Cough with yellowish phlegm, fever and dyspnea for a
few days.

EXAM:
CHEST - 2 VIEW

[chest lat]
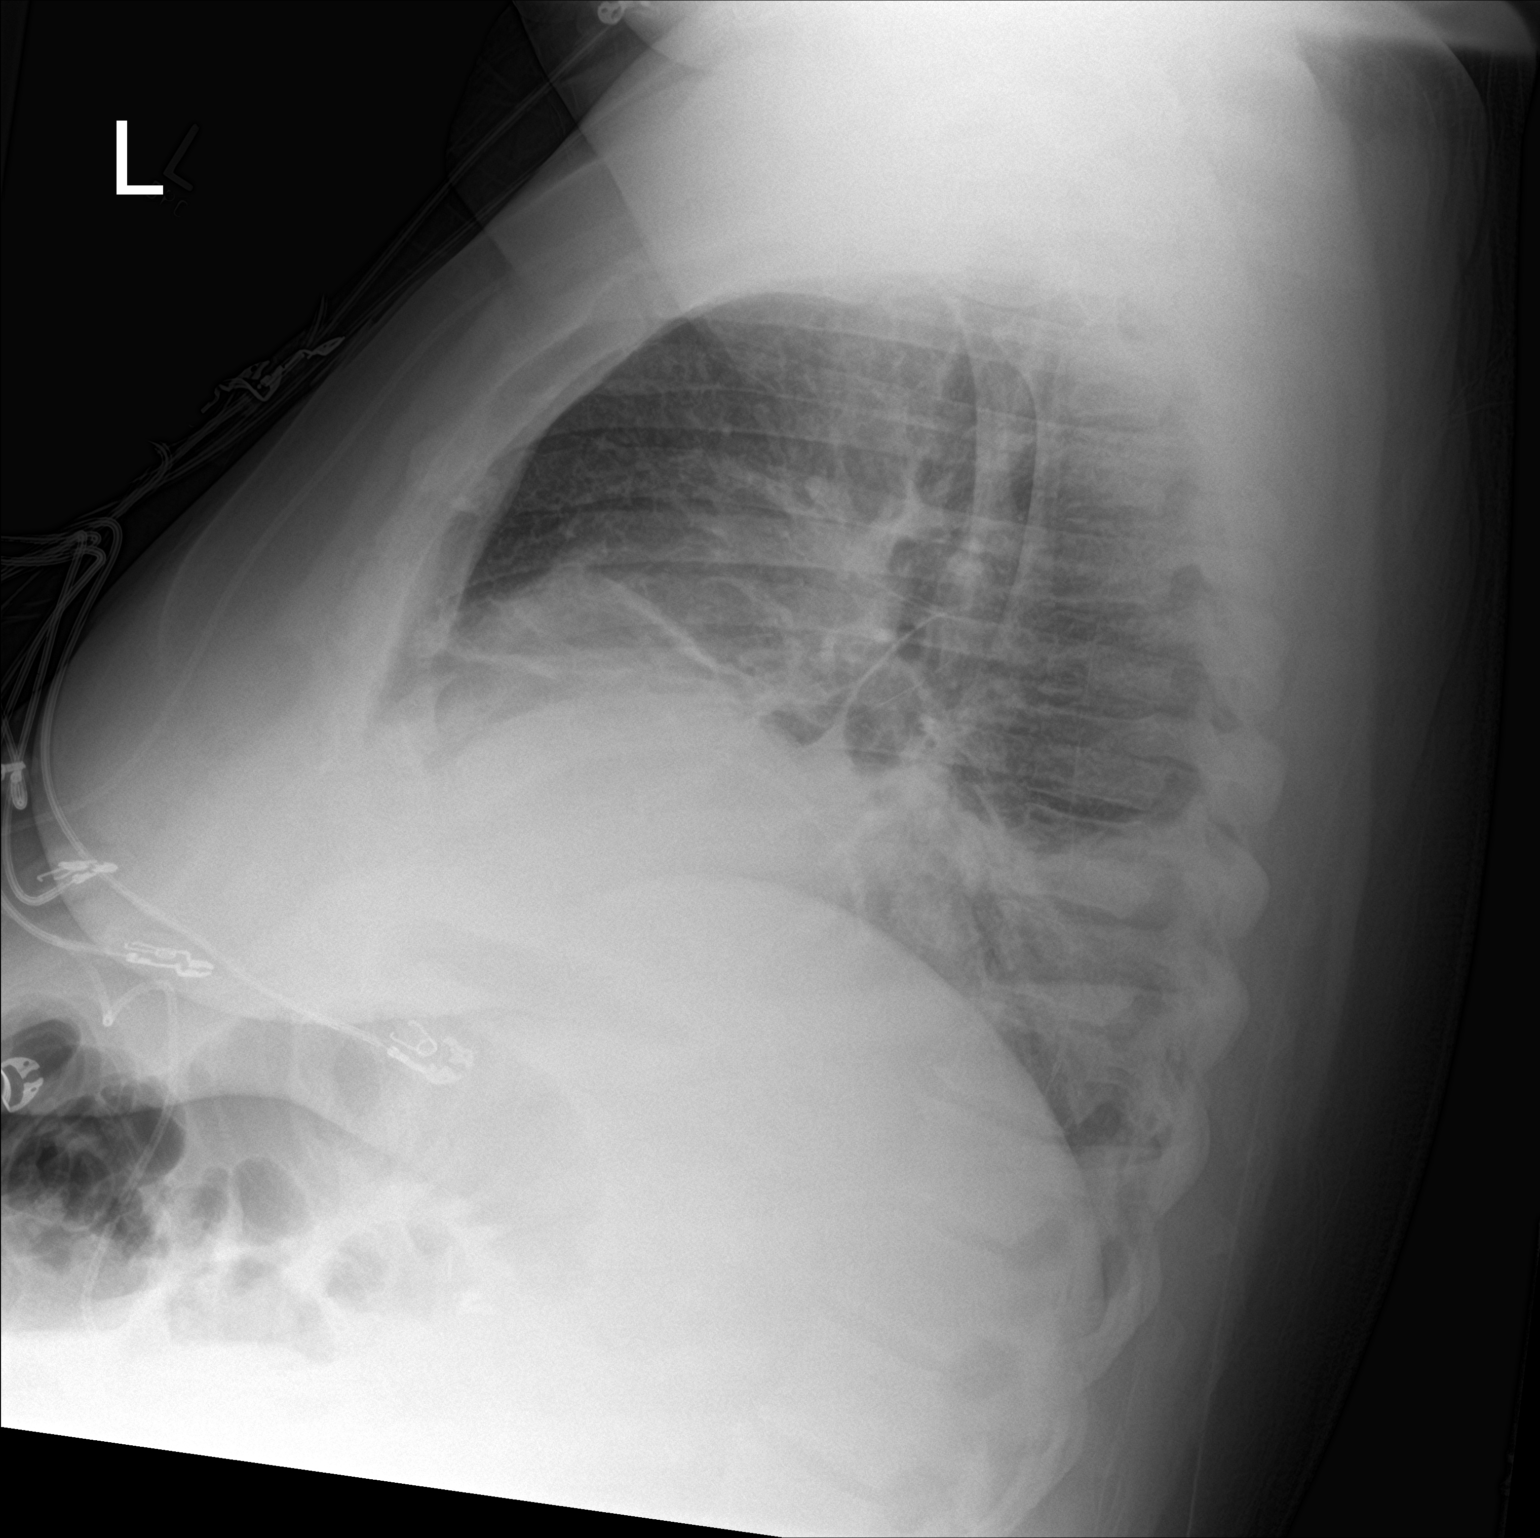

[chest ap]
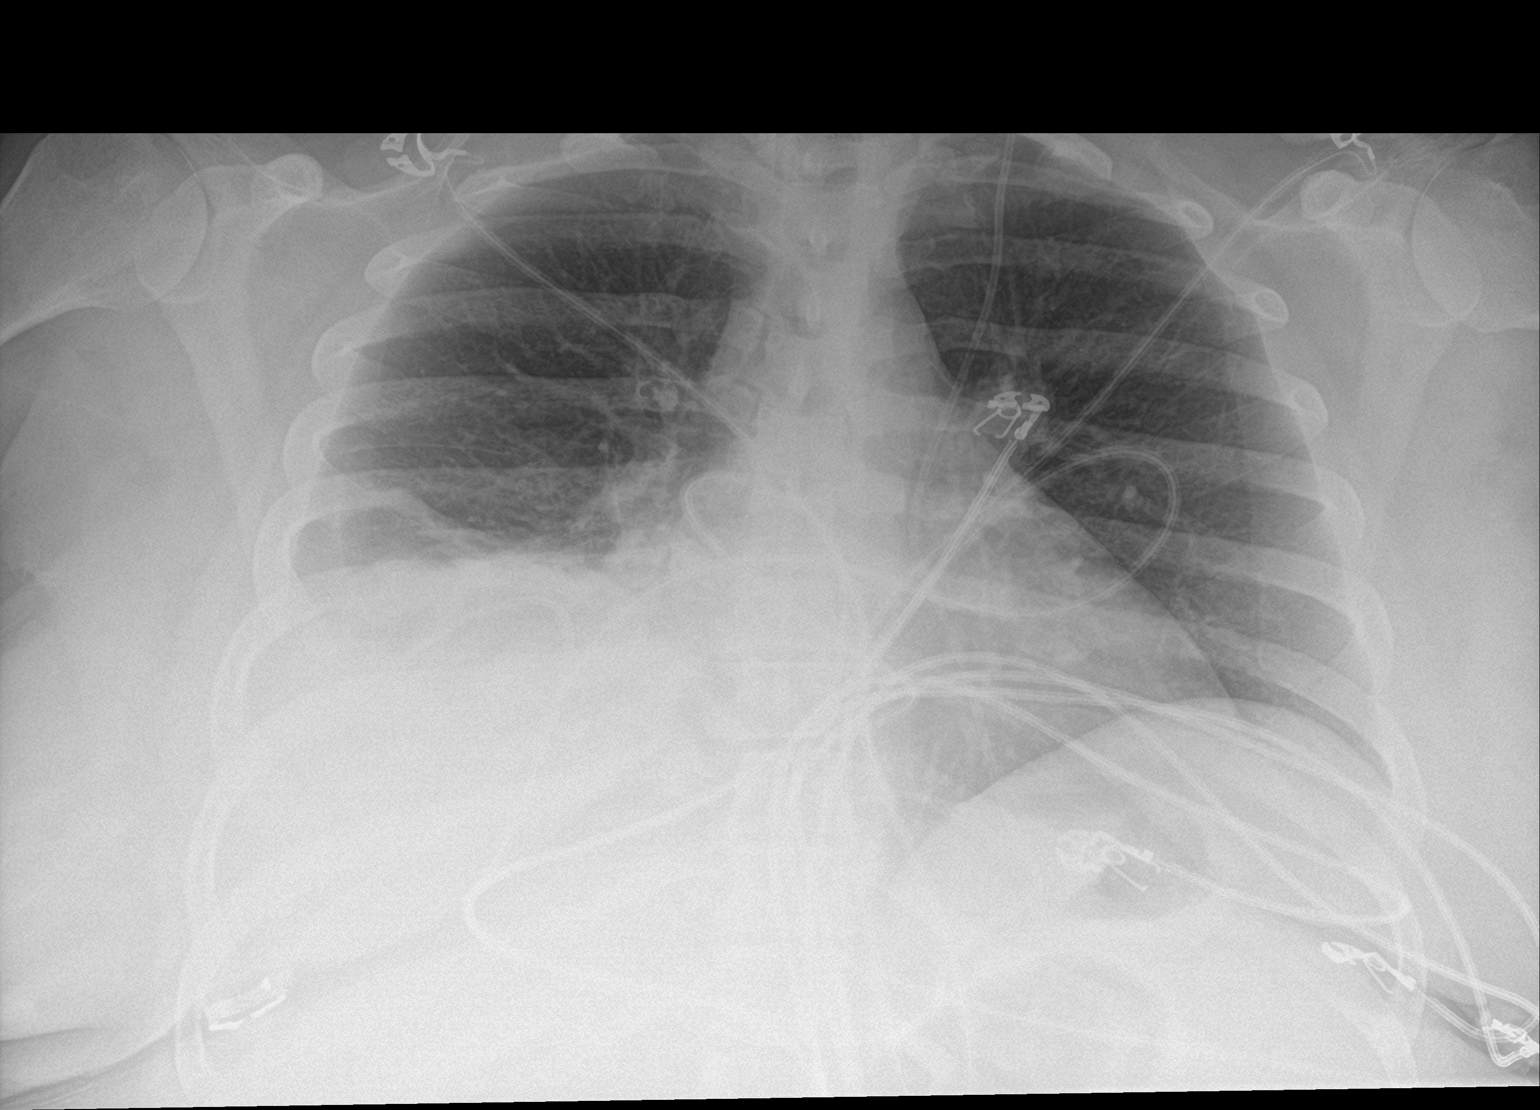

[2 of 2 positions shown; findings below may reference images not displayed]

FINDINGS: Right basilar atelectasis and/or pneumonia in the right middle and
lower lobe distribution. Left lung remains clear. Heart and
mediastinal contours are stable and within normal limits. No acute
osseous abnormality is seen.
IMPRESSION: Right middle and lower lobe atelectasis and/or pneumonia.

## 2019-06-30 DIAGNOSIS — Z4789 Encounter for other orthopedic aftercare: Secondary | ICD-10-CM | POA: Diagnosis not present

## 2019-06-30 DIAGNOSIS — M4046 Postural lordosis, lumbar region: Secondary | ICD-10-CM | POA: Diagnosis not present

## 2019-06-30 DIAGNOSIS — Z981 Arthrodesis status: Secondary | ICD-10-CM | POA: Diagnosis not present

## 2019-06-30 DIAGNOSIS — Z9049 Acquired absence of other specified parts of digestive tract: Secondary | ICD-10-CM | POA: Diagnosis not present

## 2019-06-30 DIAGNOSIS — M4316 Spondylolisthesis, lumbar region: Secondary | ICD-10-CM | POA: Diagnosis not present

## 2019-06-30 DIAGNOSIS — M5416 Radiculopathy, lumbar region: Secondary | ICD-10-CM | POA: Diagnosis not present

## 2019-07-13 DIAGNOSIS — J320 Chronic maxillary sinusitis: Secondary | ICD-10-CM | POA: Diagnosis not present

## 2019-07-13 DIAGNOSIS — J310 Chronic rhinitis: Secondary | ICD-10-CM | POA: Diagnosis not present

## 2019-07-13 DIAGNOSIS — J342 Deviated nasal septum: Secondary | ICD-10-CM | POA: Diagnosis not present

## 2019-07-13 DIAGNOSIS — J343 Hypertrophy of nasal turbinates: Secondary | ICD-10-CM | POA: Diagnosis not present

## 2019-07-20 DIAGNOSIS — Z853 Personal history of malignant neoplasm of breast: Secondary | ICD-10-CM | POA: Diagnosis not present

## 2019-07-20 DIAGNOSIS — R922 Inconclusive mammogram: Secondary | ICD-10-CM | POA: Diagnosis not present

## 2019-07-27 DIAGNOSIS — J342 Deviated nasal septum: Secondary | ICD-10-CM | POA: Diagnosis not present

## 2019-07-27 DIAGNOSIS — J310 Chronic rhinitis: Secondary | ICD-10-CM | POA: Diagnosis not present

## 2019-07-27 DIAGNOSIS — J343 Hypertrophy of nasal turbinates: Secondary | ICD-10-CM | POA: Diagnosis not present

## 2019-08-09 DIAGNOSIS — M797 Fibromyalgia: Secondary | ICD-10-CM | POA: Diagnosis not present

## 2019-08-09 DIAGNOSIS — J069 Acute upper respiratory infection, unspecified: Secondary | ICD-10-CM | POA: Diagnosis not present

## 2019-08-09 DIAGNOSIS — M62830 Muscle spasm of back: Secondary | ICD-10-CM | POA: Diagnosis not present

## 2019-08-09 DIAGNOSIS — Z299 Encounter for prophylactic measures, unspecified: Secondary | ICD-10-CM | POA: Diagnosis not present

## 2019-08-09 DIAGNOSIS — R42 Dizziness and giddiness: Secondary | ICD-10-CM | POA: Diagnosis not present

## 2019-09-02 ENCOUNTER — Other Ambulatory Visit: Payer: Self-pay

## 2019-09-02 ENCOUNTER — Encounter (HOSPITAL_BASED_OUTPATIENT_CLINIC_OR_DEPARTMENT_OTHER): Payer: Self-pay | Admitting: Otolaryngology

## 2019-09-02 NOTE — Progress Notes (Signed)
Reviewed pt's pmh with Dr. Armond Hang. Requesting medical clearance prior to surgery with Dr Suszanne Conners. Heather at Dr. Avel Sensor office aware.

## 2019-09-06 ENCOUNTER — Encounter (HOSPITAL_BASED_OUTPATIENT_CLINIC_OR_DEPARTMENT_OTHER)
Admission: RE | Admit: 2019-09-06 | Discharge: 2019-09-06 | Disposition: A | Discharge status: Home / Self Care | Payer: Medicare Other | Source: Ambulatory Visit | Attending: Otolaryngology | Admitting: Otolaryngology

## 2019-09-06 ENCOUNTER — Other Ambulatory Visit (HOSPITAL_COMMUNITY)
Admission: RE | Admit: 2019-09-06 | Discharge: 2019-09-06 | Disposition: A | Discharge status: Home / Self Care | Payer: Medicare Other | Source: Ambulatory Visit | Attending: Otolaryngology | Admitting: Otolaryngology

## 2019-09-06 DIAGNOSIS — Z20822 Contact with and (suspected) exposure to covid-19: Secondary | ICD-10-CM | POA: Diagnosis not present

## 2019-09-06 DIAGNOSIS — Z01812 Encounter for preprocedural laboratory examination: Secondary | ICD-10-CM | POA: Insufficient documentation

## 2019-09-06 LAB — BASIC METABOLIC PANEL
Anion gap: 7 (ref 5–15)
BUN: 17 mg/dL (ref 6–20)
CO2: 27 mmol/L (ref 22–32)
Calcium: 8.8 mg/dL — ABNORMAL LOW (ref 8.9–10.3)
Chloride: 108 mmol/L (ref 98–111)
Creatinine, Ser: 0.84 mg/dL (ref 0.44–1.00)
GFR calc Af Amer: >60 mL/min (ref >60)
GFR calc non Af Amer: >60 mL/min (ref >60)
Glucose, Bld: 86 mg/dL (ref 70–99)
Potassium: 4.9 mmol/L (ref 3.5–5.1)
Sodium: 142 mmol/L (ref 135–145)

## 2019-09-06 LAB — SARS CORONAVIRUS 2 (TAT 6-24 HRS): SARS Coronavirus 2: NEGATIVE

## 2019-09-08 ENCOUNTER — Other Ambulatory Visit: Payer: Self-pay | Admitting: Otolaryngology

## 2019-09-09 ENCOUNTER — Encounter (HOSPITAL_BASED_OUTPATIENT_CLINIC_OR_DEPARTMENT_OTHER): Payer: Self-pay | Admitting: Otolaryngology

## 2019-09-09 ENCOUNTER — Ambulatory Visit (HOSPITAL_BASED_OUTPATIENT_CLINIC_OR_DEPARTMENT_OTHER)
Admission: RE | Admit: 2019-09-09 | Discharge: 2019-09-09 | Disposition: A | Discharge status: Home / Self Care | Payer: Medicare Other | Attending: Otolaryngology | Admitting: Otolaryngology

## 2019-09-09 ENCOUNTER — Encounter (HOSPITAL_BASED_OUTPATIENT_CLINIC_OR_DEPARTMENT_OTHER)
Admission: RE | Disposition: A | Discharge status: Home / Self Care | Payer: Self-pay | Source: Home / Self Care | Attending: Otolaryngology

## 2019-09-09 ENCOUNTER — Other Ambulatory Visit: Payer: Self-pay

## 2019-09-09 ENCOUNTER — Ambulatory Visit (HOSPITAL_BASED_OUTPATIENT_CLINIC_OR_DEPARTMENT_OTHER): Payer: Medicare Other | Admitting: Anesthesiology

## 2019-09-09 DIAGNOSIS — F329 Major depressive disorder, single episode, unspecified: Secondary | ICD-10-CM | POA: Insufficient documentation

## 2019-09-09 DIAGNOSIS — I959 Hypotension, unspecified: Secondary | ICD-10-CM | POA: Diagnosis not present

## 2019-09-09 DIAGNOSIS — J342 Deviated nasal septum: Secondary | ICD-10-CM | POA: Diagnosis not present

## 2019-09-09 DIAGNOSIS — Z6841 Body Mass Index (BMI) 40.0 and over, adult: Secondary | ICD-10-CM | POA: Insufficient documentation

## 2019-09-09 DIAGNOSIS — F419 Anxiety disorder, unspecified: Secondary | ICD-10-CM | POA: Insufficient documentation

## 2019-09-09 DIAGNOSIS — G43909 Migraine, unspecified, not intractable, without status migrainosus: Secondary | ICD-10-CM | POA: Insufficient documentation

## 2019-09-09 DIAGNOSIS — I10 Essential (primary) hypertension: Secondary | ICD-10-CM | POA: Insufficient documentation

## 2019-09-09 DIAGNOSIS — M797 Fibromyalgia: Secondary | ICD-10-CM | POA: Insufficient documentation

## 2019-09-09 DIAGNOSIS — K219 Gastro-esophageal reflux disease without esophagitis: Secondary | ICD-10-CM | POA: Diagnosis not present

## 2019-09-09 DIAGNOSIS — J343 Hypertrophy of nasal turbinates: Secondary | ICD-10-CM | POA: Diagnosis not present

## 2019-09-09 DIAGNOSIS — J3489 Other specified disorders of nose and nasal sinuses: Secondary | ICD-10-CM | POA: Diagnosis not present

## 2019-09-09 DIAGNOSIS — Z79899 Other long term (current) drug therapy: Secondary | ICD-10-CM | POA: Diagnosis not present

## 2019-09-09 DIAGNOSIS — Z7951 Long term (current) use of inhaled steroids: Secondary | ICD-10-CM | POA: Diagnosis not present

## 2019-09-09 HISTORY — PX: NASAL SEPTOPLASTY W/ TURBINOPLASTY: SHX2070

## 2019-09-09 SURGERY — SEPTOPLASTY, NOSE, WITH NASAL TURBINATE REDUCTION
Anesthesia: General | Site: Nose | Laterality: Bilateral

## 2019-09-09 MED ORDER — CLINDAMYCIN HCL 300 MG PO CAPS
300.0000 mg | ORAL_CAPSULE | Freq: Three times a day (TID) | ORAL | 0 refills | Status: AC
Start: 2019-09-09 — End: 2019-09-12

## 2019-09-09 MED ORDER — OXYMETAZOLINE HCL 0.05 % NA SOLN
NASAL | Status: AC
  Filled 2019-09-09: qty 30

## 2019-09-09 MED ORDER — PROPOFOL 10 MG/ML IV BOLUS
INTRAVENOUS | Status: AC
  Filled 2019-09-09: qty 20

## 2019-09-09 MED ORDER — DIPHENHYDRAMINE HCL 25 MG PO CAPS
ORAL_CAPSULE | ORAL | Status: AC
  Filled 2019-09-09: qty 1

## 2019-09-09 MED ORDER — PROPOFOL 500 MG/50ML IV EMUL
INTRAVENOUS | Status: DC | PRN
  Administered 2019-09-09: 125 ug/kg/min via INTRAVENOUS

## 2019-09-09 MED ORDER — PHENYLEPHRINE 40 MCG/ML (10ML) SYRINGE FOR IV PUSH (FOR BLOOD PRESSURE SUPPORT)
PREFILLED_SYRINGE | INTRAVENOUS | Status: AC
  Filled 2019-09-09: qty 10

## 2019-09-09 MED ORDER — FENTANYL CITRATE (PF) 100 MCG/2ML IJ SOLN
INTRAMUSCULAR | Status: AC
  Filled 2019-09-09: qty 2

## 2019-09-09 MED ORDER — DEXAMETHASONE SODIUM PHOSPHATE 10 MG/ML IJ SOLN
INTRAMUSCULAR | Status: DC | PRN
  Administered 2019-09-09: 10 mg via INTRAVENOUS

## 2019-09-09 MED ORDER — SCOPOLAMINE 1 MG/3DAYS TD PT72
MEDICATED_PATCH | TRANSDERMAL | Status: AC
  Filled 2019-09-09: qty 1

## 2019-09-09 MED ORDER — ACETAMINOPHEN 500 MG PO TABS
ORAL_TABLET | ORAL | Status: AC
  Filled 2019-09-09: qty 2

## 2019-09-09 MED ORDER — OXYCODONE HCL 5 MG/5ML PO SOLN: 5.0000 mg | Freq: Once | ORAL | Status: AC | PRN

## 2019-09-09 MED ORDER — LIDOCAINE 2% (20 MG/ML) 5 ML SYRINGE
INTRAMUSCULAR | Status: DC | PRN
  Administered 2019-09-09: 100 mg via INTRAVENOUS

## 2019-09-09 MED ORDER — MIDAZOLAM HCL 2 MG/2ML IJ SOLN
INTRAMUSCULAR | Status: AC
  Filled 2019-09-09: qty 2

## 2019-09-09 MED ORDER — SCOPOLAMINE 1 MG/3DAYS TD PT72
1.0000 | MEDICATED_PATCH | Freq: Once | TRANSDERMAL | Status: DC
  Administered 2019-09-09: 1.5 mg via TRANSDERMAL

## 2019-09-09 MED ORDER — MUPIROCIN 2 % EX OINT
TOPICAL_OINTMENT | CUTANEOUS | Status: AC
  Filled 2019-09-09: qty 22

## 2019-09-09 MED ORDER — PHENYLEPHRINE 40 MCG/ML (10ML) SYRINGE FOR IV PUSH (FOR BLOOD PRESSURE SUPPORT)
PREFILLED_SYRINGE | INTRAVENOUS | Status: DC | PRN
  Administered 2019-09-09: 40 ug via INTRAVENOUS

## 2019-09-09 MED ORDER — ACETAMINOPHEN 500 MG PO TABS
1000.0000 mg | ORAL_TABLET | Freq: Once | ORAL | Status: AC
  Administered 2019-09-09: 1000 mg via ORAL

## 2019-09-09 MED ORDER — SUGAMMADEX SODIUM 200 MG/2ML IV SOLN
INTRAVENOUS | Status: DC | PRN
  Administered 2019-09-09: 300 mg via INTRAVENOUS

## 2019-09-09 MED ORDER — OXYCODONE-ACETAMINOPHEN 5-325 MG PO TABS: 1.0000 | ORAL_TABLET | ORAL | 0 refills | Status: AC | PRN

## 2019-09-09 MED ORDER — FENTANYL CITRATE (PF) 100 MCG/2ML IJ SOLN
25.0000 ug | INTRAMUSCULAR | Status: DC | PRN
  Administered 2019-09-09 (×2): 50 ug via INTRAVENOUS

## 2019-09-09 MED ORDER — DIPHENHYDRAMINE HCL 25 MG PO CAPS
25.0000 mg | ORAL_CAPSULE | Freq: Once | ORAL | Status: AC
  Administered 2019-09-09: 25 mg via ORAL

## 2019-09-09 MED ORDER — PROPOFOL 500 MG/50ML IV EMUL
INTRAVENOUS | Status: AC
  Filled 2019-09-09: qty 100

## 2019-09-09 MED ORDER — OXYMETAZOLINE HCL 0.05 % NA SOLN
NASAL | Status: DC | PRN
  Administered 2019-09-09: 1

## 2019-09-09 MED ORDER — LIDOCAINE-EPINEPHRINE 1 %-1:100000 IJ SOLN
INTRAMUSCULAR | Status: AC
  Filled 2019-09-09: qty 1

## 2019-09-09 MED ORDER — OXYCODONE HCL 5 MG PO TABS
ORAL_TABLET | ORAL | Status: AC
  Filled 2019-09-09: qty 1

## 2019-09-09 MED ORDER — ONDANSETRON HCL 4 MG/2ML IJ SOLN
INTRAMUSCULAR | Status: DC | PRN
  Administered 2019-09-09 (×2): 4 mg via INTRAVENOUS

## 2019-09-09 MED ORDER — ROCURONIUM BROMIDE 10 MG/ML (PF) SYRINGE
PREFILLED_SYRINGE | INTRAVENOUS | Status: AC
  Filled 2019-09-09: qty 10

## 2019-09-09 MED ORDER — PROMETHAZINE HCL 25 MG/ML IJ SOLN: 6.2500 mg | INTRAMUSCULAR | Status: DC | PRN

## 2019-09-09 MED ORDER — CLINDAMYCIN PHOSPHATE 900 MG/50ML IV SOLN
INTRAVENOUS | Status: AC
  Filled 2019-09-09: qty 50

## 2019-09-09 MED ORDER — MUPIROCIN 2 % EX OINT
TOPICAL_OINTMENT | CUTANEOUS | Status: DC | PRN
  Administered 2019-09-09: 1 via NASAL

## 2019-09-09 MED ORDER — OXYCODONE HCL 5 MG PO TABS
5.0000 mg | ORAL_TABLET | Freq: Once | ORAL | Status: AC | PRN
  Administered 2019-09-09: 5 mg via ORAL

## 2019-09-09 MED ORDER — LACTATED RINGERS IV SOLN: INTRAVENOUS | Status: DC

## 2019-09-09 MED ORDER — SUGAMMADEX SODIUM 500 MG/5ML IV SOLN
INTRAVENOUS | Status: AC
  Filled 2019-09-09: qty 5

## 2019-09-09 MED ORDER — PROPOFOL 10 MG/ML IV BOLUS
INTRAVENOUS | Status: DC | PRN
  Administered 2019-09-09: 200 mg via INTRAVENOUS

## 2019-09-09 MED ORDER — CLINDAMYCIN PHOSPHATE 900 MG/50ML IV SOLN
INTRAVENOUS | Status: DC | PRN
Start: 2019-09-09 — End: 2019-09-09
  Administered 2019-09-09: 900 mg via INTRAVENOUS

## 2019-09-09 MED ORDER — FENTANYL CITRATE (PF) 100 MCG/2ML IJ SOLN
INTRAMUSCULAR | Status: DC | PRN
  Administered 2019-09-09 (×2): 50 ug via INTRAVENOUS
  Administered 2019-09-09: 100 ug via INTRAVENOUS

## 2019-09-09 MED ORDER — ROCURONIUM BROMIDE 100 MG/10ML IV SOLN
INTRAVENOUS | Status: DC | PRN
  Administered 2019-09-09: 60 mg via INTRAVENOUS

## 2019-09-09 MED ORDER — LIDOCAINE 2% (20 MG/ML) 5 ML SYRINGE
INTRAMUSCULAR | Status: AC
  Filled 2019-09-09: qty 5

## 2019-09-09 MED ORDER — LIDOCAINE-EPINEPHRINE 1 %-1:100000 IJ SOLN
INTRAMUSCULAR | Status: DC | PRN
  Administered 2019-09-09: 3 mL

## 2019-09-09 MED ORDER — DIPHENHYDRAMINE HCL 50 MG/ML IJ SOLN
INTRAMUSCULAR | Status: AC
  Filled 2019-09-09: qty 1

## 2019-09-09 SURGICAL SUPPLY — 31 items
ATTRACTOMAT 16X20 MAGNETIC DRP (DRAPES) IMPLANT
CANISTER SUCT 1200ML W/VALVE (MISCELLANEOUS) ×3 IMPLANT
COAGULATOR SUCT 8FR VV (MISCELLANEOUS) ×3 IMPLANT
COVER WAND RF STERILE (DRAPES) IMPLANT
DECANTER SPIKE VIAL GLASS SM (MISCELLANEOUS) IMPLANT
DRSG NASOPORE 8CM (GAUZE/BANDAGES/DRESSINGS) IMPLANT
DRSG TELFA 3X8 NADH (GAUZE/BANDAGES/DRESSINGS) IMPLANT
ELECT REM PT RETURN 9FT ADLT (ELECTROSURGICAL) ×3
ELECTRODE REM PT RTRN 9FT ADLT (ELECTROSURGICAL) ×1 IMPLANT
GLOVE BIO SURGEON STRL SZ7.5 (GLOVE) ×3 IMPLANT
GOWN STRL REUS W/ TWL LRG LVL3 (GOWN DISPOSABLE) ×2 IMPLANT
GOWN STRL REUS W/TWL LRG LVL3 (GOWN DISPOSABLE) ×6
NEEDLE HYPO 25X1 1.5 SAFETY (NEEDLE) ×3 IMPLANT
NS IRRIG 1000ML POUR BTL (IV SOLUTION) ×3 IMPLANT
PACK BASIN DAY SURGERY FS (CUSTOM PROCEDURE TRAY) ×3 IMPLANT
PACK ENT DAY SURGERY (CUSTOM PROCEDURE TRAY) ×3 IMPLANT
SLEEVE SCD COMPRESS KNEE MED (MISCELLANEOUS) IMPLANT
SOLUTION BUTLER CLEAR DIP (MISCELLANEOUS) ×3 IMPLANT
SPLINT NASAL AIRWAY SILICONE (MISCELLANEOUS) ×3 IMPLANT
SPONGE GAUZE 2X2 8PLY STER LF (GAUZE/BANDAGES/DRESSINGS) ×1
SPONGE GAUZE 2X2 8PLY STRL LF (GAUZE/BANDAGES/DRESSINGS) ×2 IMPLANT
SPONGE NEURO XRAY DETECT 1X3 (DISPOSABLE) ×3 IMPLANT
SUT CHROMIC 4 0 P 3 18 (SUTURE) ×3 IMPLANT
SUT PLAIN 4 0 ~~LOC~~ 1 (SUTURE) ×3 IMPLANT
SUT PROLENE 3 0 PS 2 (SUTURE) ×3 IMPLANT
SUT VIC AB 4-0 P-3 18XBRD (SUTURE) IMPLANT
SUT VIC AB 4-0 P3 18 (SUTURE)
TOWEL GREEN STERILE FF (TOWEL DISPOSABLE) ×3 IMPLANT
TUBE SALEM SUMP 12R W/ARV (TUBING) IMPLANT
TUBE SALEM SUMP 16 FR W/ARV (TUBING) ×3 IMPLANT
YANKAUER SUCT BULB TIP NO VENT (SUCTIONS) ×3 IMPLANT

## 2019-09-09 NOTE — Discharge Instructions (Addendum)
POSTOPERATIVE INSTRUCTIONS FOR PATIENTS HAVING NASAL OR SINUS OPERATIONS ACTIVITY: Restrict activity at home for the first two days, resting as much as possible. Light activity is best. You may usually return to work within a week. You should refrain from nose blowing, strenuous activity, or heavy lifting greater than 20lbs for a total of one week after your operation.  If sneezing cannot be avoided, sneeze with your mouth open. DISCOMFORT: You may experience a dull headache and pressure along with nasal congestion and discharge. These symptoms may be worse during the first week after the operation but may last as long as two to four weeks.  Please take Tylenol or the pain medication that has been prescribed for you. Do not take aspirin or aspirin containing medications since they may cause bleeding.  You may experience symptoms of post nasal drainage, nasal congestion, headaches and fatigue for two or three months after your operation.  BLEEDING: You may have some blood tinged nasal drainage for approximately two weeks after the operation.  The discharge will be worse for the first week.  Please call our office at [phone] or go to the nearest hospital emergency room if you experience any of the following: heavy, bright red blood from your nose or mouth that lasts longer than 15 minutes or coughing up or vomiting bright red blood or blood clots. GENERAL CONSIDERATIONS: 1. A gauze dressing will be placed on your upper lip to absorb any drainage after the operation. You may need to change this several times a day.  If you do not have very much drainage, you may remove the dressing.  Remember that you may gently wipe your nose with a tissue and sniff in, but DO NOT blow your nose. 2. Please keep all of your postoperative appointments.  Your final results after the operation will depend on proper follow-up.  The initial visit is usually 2 to 5 days after the operation.  During this visit, the remaining nasal  packing and internal septal splints will be removed.  Your nasal and sinus cavities will be cleaned.  During the second visit, your nasal and sinus cavities will be cleaned again. Have someone drive you to your first two postoperative appointments.  3. How you care for your nose after the operation will influence the results that you obtain.  You should follow all directions, take your medication as prescribed, and call our office [phone] with any problems or questions. 4. You may be more comfortable sleeping with your head elevated on two pillows. 5. Do not take any medications that we have not prescribed or recommended. WARNING SIGNS: if any of the following should occur, please call our office: 1. Persistent fever greater than 102F. 2. Persistent vomiting. 3. Severe and constant pain that is not relieved by prescribed pain medication. 4. Trauma to the nose. Rash or unusual side effects from any medicines.   Post Anesthesia Home Care Instructions  Activity: Get plenty of rest for the remainder of the day. A responsible individual must stay with you for 24 hours following the procedure.  For the next 24 hours, DO NOT: -Drive a car -Advertising copywriter -Drink alcoholic beverages -Take any medication unless instructed by your physician -Make any legal decisions or sign important papers.  Meals: Start with liquid foods such as gelatin or soup. Progress to regular foods as tolerated. Avoid greasy, spicy, heavy foods. If nausea and/or vomiting occur, drink only clear liquids until the nausea and/or vomiting subsides. Call your physician if vomiting continues.  Special Instructions/Symptoms: Your throat may feel dry or sore from the anesthesia or the breathing tube placed in your throat during surgery. If this causes discomfort, gargle with warm salt water. The discomfort should disappear within 24 hours.  If you had a scopolamine patch placed behind your ear for the management of post-  operative nausea and/or vomiting:  1. The medication in the patch is effective for 72 hours, after which it should be removed.  Wrap patch in a tissue and discard in the trash. Wash hands thoroughly with soap and water. 2. You may remove the patch earlier than 72 hours if you experience unpleasant side effects which may include dry mouth, dizziness or visual disturbances. 3. Avoid touching the patch. Wash your hands with soap and water after contact with the patch.     

## 2019-09-09 NOTE — Op Note (Signed)
DATE OF PROCEDURE: 09/09/2019  OPERATIVE REPORT   SURGEON: Newman Pies, MD   PREOPERATIVE DIAGNOSES:  1. Severe nasal septal deviation.  2. Bilateral inferior turbinate hypertrophy.  3. Chronic nasal obstruction.  POSTOPERATIVE DIAGNOSES:  1. Severe nasal septal deviation.  2. Bilateral inferior turbinate hypertrophy.  3. Chronic nasal obstruction.  PROCEDURE PERFORMED:  1. Septoplasty.  2. Bilateral partial inferior turbinate resection.   ANESTHESIA: General endotracheal tube anesthesia.   COMPLICATIONS: None.   ESTIMATED BLOOD LOSS: 25 mL.   INDICATION FOR PROCEDURE: Sandra [name] is a 44 y.o. female with a history of chronic nasal obstruction. The patient was treated with antihistamine, decongestant, and steroid nasal sprays. However, the patient continued to be symptomatic. On examination, the patient was noted to have bilateral severe inferior turbinate hypertrophy and significant nasal septal deviation, causing significant nasal obstruction. Based on the above findings, the decision was made for the patient to undergo the above-stated procedures. The risks, benefits, alternatives, and details of the procedures were discussed with the patient. Questions were invited and answered. Informed consent was obtained.   DESCRIPTION OF PROCEDURE: The patient was taken to the operating room and placed supine on the operating table. General endotracheal tube anesthesia was administered by the anesthesiologist. The patient was positioned, and prepped and draped in the standard fashion for nasal surgery. Pledgets soaked with Afrin were placed in both nasal cavities for decongestion. The pledgets were subsequently removed.   Examination of the nasal cavity revealed significant nasal septal deviation. 1% lidocaine with 1:100,000 epinephrine was injected onto the nasal septum bilaterally. A hemitransfixion incision was made on the left side. The mucosal flap was carefully elevated on the left side. A  cartilaginous incision was made 1 cm superior to the caudal margin of the nasal septum. Mucosal flap was also elevated on the right side in the similar fashion. It should be noted that due to the severe septal deviation, the deviated portion of the cartilaginous and bony septum had to be removed in piecemeal fashion. Once the deviated portions were removed, a straight midline septum was achieved. The septum was then quilted with 4-0 plain gut sutures. The hemitransfixion incision was closed with interrupted 4-0 chromic sutures.   The inferior one half of both hypertrophied inferior turbinate was crossclamped with a Kelly clamp. The inferior one half of each inferior turbinate was then resected with a pair of cross cutting scissors. Hemostasis was achieved with a suction cautery device.  Doyle splints were applied to the nasal septum.  The care of the patient was turned over to the anesthesiologist. The patient was awakened from anesthesia without difficulty. The patient was extubated and transferred to the recovery room in good condition.   OPERATIVE FINDINGS: Nasal septal deviation and bilateral inferior turbinate hypertrophy.   SPECIMEN: None.   FOLLOWUP CARE: The patient be discharged home once she is awake and alert. The patient will be placed on Percocet 1 tablets p.o. q.4 hours p.r.n. pain, and clindamycin for 3 days. The patient will follow up in my office in 3 days for splint removal.   Kwadwo Taras Philomena Doheny, MD

## 2019-09-09 NOTE — H&P (Signed)
Cc: Chronic nasal obstruction  HPI: The patient is a 44 year old female who presents today complaining of persistent nasal congestion and facial pressure.  The patient was last seen 2 weeks ago.  At that time, she was noted to have severe nasal mucosal congestion, nasal septal deviation and bilateral inferior turbinate hypertrophy.  She was also having significant allergy symptoms with nasal drainage and facial discomfort.  She was treated with Flonase, Azelastine and Prednisone dosepak.   She returns today complaining of persistent symptoms.  She is having significant difficulty breathing through her nose, especially at nighttime.  She was also treated with multiple courses of antibiotics without improvement in her symptoms.    Exam: The flexible scope was inserted into the right nasal cavity.  Endoscopy of the interior nasal cavity, superior, inferior, and middle meatus was performed. The sphenoid-ethmoid recess was examined. Severely edematous mucosa was noted.  No polyp, mass, or lesion was appreciated. Nasal septal deviation noted.  Olfactory cleft was clear.  Nasopharynx was clear.  Turbinates were hypertrophied but without mass.  Incomplete response to decongestion.  The procedure was repeated on the contralateral side with similar findings.  The patient tolerated the procedure well.  Instructions were given to avoid eating or drinking for 2 hours.  Assessment: 1.  Chronic rhinitis with severe nasal mucosal congestion, nasal septal deviation, and bilateral inferior turbinate hypertrophy.   2.  No purulent drainage, polyps, mass, or lesion is noted today.   Plan: 1.  The nasal endoscopy findings are reviewed with the patient.  2.  Continue with her current allergy treatment regimen.   3.  In light of her persistent nasal obstruction, the patient may benefit from undergoing septoplasty and turbinate reduction surgery to improve her nasal passageways.  Currently more than 95% of her nasal  passageways are obstructed bilaterally.  4.  The risks, benefits, alternatives and details of the procedures are reviewed with the  patient.  Questions are invited and answered.  5.  The patient would like to proceed with the procedures.

## 2019-09-09 NOTE — Anesthesia Preprocedure Evaluation (Addendum)
Anesthesia Evaluation  Patient identified by MRN, date of birth, ID band Patient awake    Reviewed: Allergy & Precautions, NPO status , Patient's Chart, lab work & pertinent test results, reviewed documented beta blocker date and time   History of Anesthesia Complications (+) PONV, POST - OP SPINAL HEADACHE and history of anesthetic complications  Airway Mallampati: II  TM Distance: >3 FB Neck ROM: Full    Dental no notable dental hx.    Pulmonary neg pulmonary ROS,    Pulmonary exam normal        Cardiovascular hypertension, Pt. on medications and Pt. on home beta blockers Normal cardiovascular exam  POTS  TTE 2020: EF 60-65%, DD, normal valves   Neuro/Psych  Headaches, Anxiety Depression Idiopathic intracranial hypertension    GI/Hepatic Neg liver ROS, hiatal hernia, GERD  ,  Endo/Other  Morbid obesity  Renal/GU negative Renal ROS  negative genitourinary   Musculoskeletal  (+) Arthritis , Fibromyalgia -  Abdominal   Peds  Hematology negative hematology ROS (+)   Anesthesia Other Findings Day of surgery medications reviewed with patient.  Reproductive/Obstetrics negative OB ROS                            Anesthesia Physical Anesthesia Plan  ASA: III  Anesthesia Plan: General   Post-op Pain Management:    Induction: Intravenous  PONV Risk Score and Plan: 4 or greater and Treatment may vary due to age or medical condition, Ondansetron, Dexamethasone, Scopolamine patch - Pre-op, Propofol infusion and TIVA  Airway Management Planned: Oral ETT  Additional Equipment: None  Intra-op Plan:   Post-operative Plan: Extubation in OR  Informed Consent: I have reviewed the patients History and Physical, chart, labs and discussed the procedure including the risks, benefits and alternatives for the proposed anesthesia with the patient or authorized representative who has indicated his/her  understanding and acceptance.     Dental advisory given  Plan Discussed with: CRNA  Anesthesia Plan Comments:       Anesthesia Quick Evaluation

## 2019-09-09 NOTE — Transfer of Care (Signed)
Immediate Anesthesia Transfer of Care Note  Patient: Sandra [name]  Procedure(s) Performed: NASAL SEPTOPLASTY WITH BILATERAL TURBINATE REDUCTION (Bilateral Nose)  Patient Location: PACU  Anesthesia Type:General  Level of Consciousness: awake, alert  and oriented  Airway & Oxygen Therapy: Patient Spontanous Breathing and Patient connected to face mask oxygen  Post-op Assessment: Report given to RN and Post -op Vital signs reviewed and stable  Post vital signs: Reviewed and stable  Last Vitals:  Vitals Value Taken Time  BP 140/70 09/09/19 1015  Temp 36.4 C 09/09/19 1015  Pulse 80 09/09/19 1018  Resp 11 09/09/19 1018  SpO2 100 % 09/09/19 1018  Vitals shown include unvalidated device data.  Last Pain:  Vitals:   09/09/19 1015  TempSrc:   PainSc: 8       Patients Stated Pain Goal: 2 (09/09/19 1015)  Complications: No complications documented.

## 2019-09-09 NOTE — Anesthesia Postprocedure Evaluation (Signed)
Anesthesia Post Note  Patient: Development worker, community  Procedure(s) Performed: NASAL SEPTOPLASTY WITH BILATERAL TURBINATE REDUCTION (Bilateral Nose)     Patient location during evaluation: PACU Anesthesia Type: General Level of consciousness: awake and alert and oriented Pain management: pain level controlled Vital Signs Assessment: post-procedure vital signs reviewed and stable Respiratory status: spontaneous breathing, nonlabored ventilation and respiratory function stable Cardiovascular status: blood pressure returned to baseline Postop Assessment: no apparent nausea or vomiting Anesthetic complications: no   No complications documented.  Last Vitals:  Vitals:   09/09/19 1030 09/09/19 1031  BP: (!) 142/76   Pulse: 75 79  Resp: 16 16  Temp:    SpO2: 100% 99%    Last Pain:  Vitals:   09/09/19 1030  TempSrc:   PainSc: 7                  Kaylyn Layer

## 2019-09-09 NOTE — Anesthesia Procedure Notes (Signed)
Procedure Name: Intubation Date/Time: 09/09/2019 9:06 AM Performed by: Marny Lowenstein, CRNA Pre-anesthesia Checklist: Patient identified, Emergency Drugs available, Suction available and Patient being monitored Patient Re-evaluated:Patient Re-evaluated prior to induction Oxygen Delivery Method: Circle system utilized Preoxygenation: Pre-oxygenation with 100% oxygen Induction Type: IV induction Ventilation: Mask ventilation without difficulty Laryngoscope Size: Miller and 2 Grade View: Grade I Tube type: Oral Tube size: 7.0 mm Number of attempts: 1 Airway Equipment and Method: Patient positioned with wedge pillow and Stylet Placement Confirmation: ETT inserted through vocal cords under direct vision and positive ETCO2 Secured at: 21 cm Tube secured with: Tape Dental Injury: Teeth and Oropharynx as per pre-operative assessment

## 2019-09-12 ENCOUNTER — Encounter (HOSPITAL_BASED_OUTPATIENT_CLINIC_OR_DEPARTMENT_OTHER): Payer: Self-pay | Admitting: Otolaryngology

## 2019-11-08 ENCOUNTER — Other Ambulatory Visit: Payer: Self-pay | Admitting: Otolaryngology

## 2019-11-08 ENCOUNTER — Other Ambulatory Visit (HOSPITAL_COMMUNITY): Payer: Self-pay | Admitting: Otolaryngology

## 2019-11-08 DIAGNOSIS — J320 Chronic maxillary sinusitis: Secondary | ICD-10-CM | POA: Diagnosis not present

## 2019-11-08 DIAGNOSIS — J329 Chronic sinusitis, unspecified: Secondary | ICD-10-CM

## 2019-11-08 DIAGNOSIS — J0101 Acute recurrent maxillary sinusitis: Secondary | ICD-10-CM | POA: Diagnosis not present

## 2019-11-15 ENCOUNTER — Ambulatory Visit (HOSPITAL_COMMUNITY): Payer: Medicare Other

## 2019-11-18 DIAGNOSIS — I10 Essential (primary) hypertension: Secondary | ICD-10-CM | POA: Diagnosis not present

## 2019-11-18 DIAGNOSIS — Z79899 Other long term (current) drug therapy: Secondary | ICD-10-CM | POA: Diagnosis not present

## 2019-11-18 DIAGNOSIS — J302 Other seasonal allergic rhinitis: Secondary | ICD-10-CM | POA: Diagnosis not present

## 2019-11-18 DIAGNOSIS — M797 Fibromyalgia: Secondary | ICD-10-CM | POA: Diagnosis not present

## 2019-11-18 DIAGNOSIS — Z299 Encounter for prophylactic measures, unspecified: Secondary | ICD-10-CM | POA: Diagnosis not present

## 2019-11-22 ENCOUNTER — Ambulatory Visit (HOSPITAL_COMMUNITY)
Admission: RE | Admit: 2019-11-22 | Discharge: 2019-11-22 | Disposition: A | Discharge status: Home / Self Care | Payer: Medicare Other | Source: Ambulatory Visit | Attending: Otolaryngology | Admitting: Otolaryngology

## 2019-11-22 ENCOUNTER — Other Ambulatory Visit: Payer: Self-pay

## 2019-11-22 DIAGNOSIS — J329 Chronic sinusitis, unspecified: Secondary | ICD-10-CM | POA: Diagnosis not present

## 2019-11-22 DIAGNOSIS — J321 Chronic frontal sinusitis: Secondary | ICD-10-CM | POA: Diagnosis not present

## 2019-11-22 DIAGNOSIS — J320 Chronic maxillary sinusitis: Secondary | ICD-10-CM | POA: Diagnosis not present

## 2019-11-22 DIAGNOSIS — J3489 Other specified disorders of nose and nasal sinuses: Secondary | ICD-10-CM | POA: Diagnosis not present

## 2020-01-24 DIAGNOSIS — I10 Essential (primary) hypertension: Secondary | ICD-10-CM | POA: Diagnosis not present

## 2020-01-24 DIAGNOSIS — Z299 Encounter for prophylactic measures, unspecified: Secondary | ICD-10-CM | POA: Diagnosis not present

## 2020-01-24 DIAGNOSIS — Z6841 Body Mass Index (BMI) 40.0 and over, adult: Secondary | ICD-10-CM | POA: Diagnosis not present

## 2020-01-24 DIAGNOSIS — Z789 Other specified health status: Secondary | ICD-10-CM | POA: Diagnosis not present

## 2020-01-24 DIAGNOSIS — J329 Chronic sinusitis, unspecified: Secondary | ICD-10-CM | POA: Diagnosis not present

## 2020-03-05 DIAGNOSIS — I10 Essential (primary) hypertension: Secondary | ICD-10-CM | POA: Diagnosis not present

## 2020-03-05 DIAGNOSIS — Z299 Encounter for prophylactic measures, unspecified: Secondary | ICD-10-CM | POA: Diagnosis not present

## 2020-03-05 DIAGNOSIS — M797 Fibromyalgia: Secondary | ICD-10-CM | POA: Diagnosis not present

## 2020-03-05 DIAGNOSIS — Z6841 Body Mass Index (BMI) 40.0 and over, adult: Secondary | ICD-10-CM | POA: Diagnosis not present

## 2020-03-06 DIAGNOSIS — J45909 Unspecified asthma, uncomplicated: Secondary | ICD-10-CM | POA: Diagnosis not present

## 2020-03-06 DIAGNOSIS — Z299 Encounter for prophylactic measures, unspecified: Secondary | ICD-10-CM | POA: Diagnosis not present

## 2020-03-06 DIAGNOSIS — J069 Acute upper respiratory infection, unspecified: Secondary | ICD-10-CM | POA: Diagnosis not present

## 2020-03-06 DIAGNOSIS — I10 Essential (primary) hypertension: Secondary | ICD-10-CM | POA: Diagnosis not present

## 2020-03-06 DIAGNOSIS — Z6841 Body Mass Index (BMI) 40.0 and over, adult: Secondary | ICD-10-CM | POA: Diagnosis not present

## 2020-03-06 DIAGNOSIS — K589 Irritable bowel syndrome without diarrhea: Secondary | ICD-10-CM | POA: Diagnosis not present

## 2020-03-26 DIAGNOSIS — I10 Essential (primary) hypertension: Secondary | ICD-10-CM | POA: Diagnosis not present

## 2020-03-26 DIAGNOSIS — U071 COVID-19: Secondary | ICD-10-CM | POA: Diagnosis not present

## 2020-03-26 DIAGNOSIS — Z299 Encounter for prophylactic measures, unspecified: Secondary | ICD-10-CM | POA: Diagnosis not present

## 2020-03-30 ENCOUNTER — Emergency Department (HOSPITAL_COMMUNITY): Payer: Medicare Other

## 2020-03-30 ENCOUNTER — Encounter (HOSPITAL_COMMUNITY): Payer: Self-pay

## 2020-03-30 ENCOUNTER — Inpatient Hospital Stay (HOSPITAL_COMMUNITY)
Admission: EM | Admit: 2020-03-30 | Discharge: 2020-05-25 | DRG: 207 | Disposition: E | Payer: Medicare Other | Attending: Pulmonary Disease | Admitting: Pulmonary Disease

## 2020-03-30 ENCOUNTER — Other Ambulatory Visit: Payer: Self-pay

## 2020-03-30 DIAGNOSIS — R042 Hemoptysis: Secondary | ICD-10-CM | POA: Diagnosis not present

## 2020-03-30 DIAGNOSIS — N179 Acute kidney failure, unspecified: Secondary | ICD-10-CM | POA: Diagnosis not present

## 2020-03-30 DIAGNOSIS — J069 Acute upper respiratory infection, unspecified: Secondary | ICD-10-CM | POA: Diagnosis not present

## 2020-03-30 DIAGNOSIS — Z515 Encounter for palliative care: Secondary | ICD-10-CM

## 2020-03-30 DIAGNOSIS — Z8051 Family history of malignant neoplasm of kidney: Secondary | ICD-10-CM

## 2020-03-30 DIAGNOSIS — Y92239 Unspecified place in hospital as the place of occurrence of the external cause: Secondary | ICD-10-CM | POA: Diagnosis not present

## 2020-03-30 DIAGNOSIS — K5900 Constipation, unspecified: Secondary | ICD-10-CM | POA: Diagnosis not present

## 2020-03-30 DIAGNOSIS — G8929 Other chronic pain: Secondary | ICD-10-CM | POA: Diagnosis present

## 2020-03-30 DIAGNOSIS — J189 Pneumonia, unspecified organism: Secondary | ICD-10-CM | POA: Diagnosis not present

## 2020-03-30 DIAGNOSIS — T502X5A Adverse effect of carbonic-anhydrase inhibitors, benzothiadiazides and other diuretics, initial encounter: Secondary | ICD-10-CM | POA: Diagnosis not present

## 2020-03-30 DIAGNOSIS — I498 Other specified cardiac arrhythmias: Secondary | ICD-10-CM | POA: Diagnosis present

## 2020-03-30 DIAGNOSIS — I2699 Other pulmonary embolism without acute cor pulmonale: Secondary | ICD-10-CM | POA: Diagnosis not present

## 2020-03-30 DIAGNOSIS — N390 Urinary tract infection, site not specified: Secondary | ICD-10-CM | POA: Diagnosis not present

## 2020-03-30 DIAGNOSIS — U071 COVID-19: Secondary | ICD-10-CM | POA: Diagnosis not present

## 2020-03-30 DIAGNOSIS — T83518A Infection and inflammatory reaction due to other urinary catheter, initial encounter: Secondary | ICD-10-CM | POA: Diagnosis not present

## 2020-03-30 DIAGNOSIS — R6521 Severe sepsis with septic shock: Secondary | ICD-10-CM | POA: Diagnosis not present

## 2020-03-30 DIAGNOSIS — Z801 Family history of malignant neoplasm of trachea, bronchus and lung: Secondary | ICD-10-CM

## 2020-03-30 DIAGNOSIS — I10 Essential (primary) hypertension: Secondary | ICD-10-CM | POA: Diagnosis present

## 2020-03-30 DIAGNOSIS — R0600 Dyspnea, unspecified: Secondary | ICD-10-CM

## 2020-03-30 DIAGNOSIS — Z8249 Family history of ischemic heart disease and other diseases of the circulatory system: Secondary | ICD-10-CM

## 2020-03-30 DIAGNOSIS — Z9049 Acquired absence of other specified parts of digestive tract: Secondary | ICD-10-CM | POA: Diagnosis not present

## 2020-03-30 DIAGNOSIS — Z4682 Encounter for fitting and adjustment of non-vascular catheter: Secondary | ICD-10-CM | POA: Diagnosis not present

## 2020-03-30 DIAGNOSIS — D62 Acute posthemorrhagic anemia: Secondary | ICD-10-CM | POA: Diagnosis not present

## 2020-03-30 DIAGNOSIS — Z452 Encounter for adjustment and management of vascular access device: Secondary | ICD-10-CM | POA: Diagnosis not present

## 2020-03-30 DIAGNOSIS — Z1639 Resistance to other specified antimicrobial drug: Secondary | ICD-10-CM | POA: Diagnosis not present

## 2020-03-30 DIAGNOSIS — Y846 Urinary catheterization as the cause of abnormal reaction of the patient, or of later complication, without mention of misadventure at the time of the procedure: Secondary | ICD-10-CM | POA: Diagnosis not present

## 2020-03-30 DIAGNOSIS — Z833 Family history of diabetes mellitus: Secondary | ICD-10-CM

## 2020-03-30 DIAGNOSIS — T85730A Infection and inflammatory reaction due to ventricular intracranial (communicating) shunt, initial encounter: Secondary | ICD-10-CM | POA: Diagnosis not present

## 2020-03-30 DIAGNOSIS — J80 Acute respiratory distress syndrome: Secondary | ICD-10-CM

## 2020-03-30 DIAGNOSIS — R0902 Hypoxemia: Secondary | ICD-10-CM

## 2020-03-30 DIAGNOSIS — Z978 Presence of other specified devices: Secondary | ICD-10-CM

## 2020-03-30 DIAGNOSIS — Z9289 Personal history of other medical treatment: Secondary | ICD-10-CM

## 2020-03-30 DIAGNOSIS — J1282 Pneumonia due to coronavirus disease 2019: Secondary | ICD-10-CM | POA: Diagnosis present

## 2020-03-30 DIAGNOSIS — R918 Other nonspecific abnormal finding of lung field: Secondary | ICD-10-CM | POA: Diagnosis not present

## 2020-03-30 DIAGNOSIS — M797 Fibromyalgia: Secondary | ICD-10-CM | POA: Diagnosis present

## 2020-03-30 DIAGNOSIS — J9601 Acute respiratory failure with hypoxia: Secondary | ICD-10-CM

## 2020-03-30 DIAGNOSIS — Z4659 Encounter for fitting and adjustment of other gastrointestinal appliance and device: Secondary | ICD-10-CM

## 2020-03-30 DIAGNOSIS — E878 Other disorders of electrolyte and fluid balance, not elsewhere classified: Secondary | ICD-10-CM | POA: Diagnosis not present

## 2020-03-30 DIAGNOSIS — Z66 Do not resuscitate: Secondary | ICD-10-CM | POA: Diagnosis not present

## 2020-03-30 DIAGNOSIS — Z880 Allergy status to penicillin: Secondary | ICD-10-CM

## 2020-03-30 DIAGNOSIS — E876 Hypokalemia: Secondary | ICD-10-CM | POA: Diagnosis not present

## 2020-03-30 DIAGNOSIS — G932 Benign intracranial hypertension: Secondary | ICD-10-CM | POA: Diagnosis present

## 2020-03-30 DIAGNOSIS — I2693 Single subsegmental pulmonary embolism without acute cor pulmonale: Secondary | ICD-10-CM | POA: Diagnosis not present

## 2020-03-30 DIAGNOSIS — Z90710 Acquired absence of both cervix and uterus: Secondary | ICD-10-CM

## 2020-03-30 DIAGNOSIS — E1165 Type 2 diabetes mellitus with hyperglycemia: Secondary | ICD-10-CM | POA: Diagnosis not present

## 2020-03-30 DIAGNOSIS — A411 Sepsis due to other specified staphylococcus: Secondary | ICD-10-CM | POA: Diagnosis not present

## 2020-03-30 DIAGNOSIS — J9690 Respiratory failure, unspecified, unspecified whether with hypoxia or hypercapnia: Secondary | ICD-10-CM | POA: Diagnosis not present

## 2020-03-30 DIAGNOSIS — T80218A Other infection due to central venous catheter, initial encounter: Secondary | ICD-10-CM | POA: Diagnosis not present

## 2020-03-30 DIAGNOSIS — Z823 Family history of stroke: Secondary | ICD-10-CM

## 2020-03-30 DIAGNOSIS — Z91040 Latex allergy status: Secondary | ICD-10-CM

## 2020-03-30 DIAGNOSIS — B957 Other staphylococcus as the cause of diseases classified elsewhere: Secondary | ICD-10-CM

## 2020-03-30 DIAGNOSIS — Z79899 Other long term (current) drug therapy: Secondary | ICD-10-CM

## 2020-03-30 DIAGNOSIS — E785 Hyperlipidemia, unspecified: Secondary | ICD-10-CM | POA: Diagnosis present

## 2020-03-30 DIAGNOSIS — Z888 Allergy status to other drugs, medicaments and biological substances status: Secondary | ICD-10-CM

## 2020-03-30 DIAGNOSIS — R7881 Bacteremia: Secondary | ICD-10-CM

## 2020-03-30 DIAGNOSIS — G43909 Migraine, unspecified, not intractable, without status migrainosus: Secondary | ICD-10-CM | POA: Diagnosis present

## 2020-03-30 DIAGNOSIS — J440 Chronic obstructive pulmonary disease with acute lower respiratory infection: Secondary | ICD-10-CM | POA: Diagnosis present

## 2020-03-30 DIAGNOSIS — Z6841 Body Mass Index (BMI) 40.0 and over, adult: Secondary | ICD-10-CM

## 2020-03-30 DIAGNOSIS — R7989 Other specified abnormal findings of blood chemistry: Secondary | ICD-10-CM | POA: Diagnosis present

## 2020-03-30 DIAGNOSIS — J90 Pleural effusion, not elsewhere classified: Secondary | ICD-10-CM | POA: Diagnosis not present

## 2020-03-30 DIAGNOSIS — Z9911 Dependence on respirator [ventilator] status: Secondary | ICD-10-CM

## 2020-03-30 DIAGNOSIS — F419 Anxiety disorder, unspecified: Secondary | ICD-10-CM | POA: Diagnosis present

## 2020-03-30 DIAGNOSIS — I959 Hypotension, unspecified: Secondary | ICD-10-CM | POA: Diagnosis present

## 2020-03-30 DIAGNOSIS — G90A Postural orthostatic tachycardia syndrome (POTS): Secondary | ICD-10-CM | POA: Diagnosis present

## 2020-03-30 DIAGNOSIS — J984 Other disorders of lung: Secondary | ICD-10-CM | POA: Diagnosis not present

## 2020-03-30 DIAGNOSIS — R0689 Other abnormalities of breathing: Secondary | ICD-10-CM

## 2020-03-30 DIAGNOSIS — Z7189 Other specified counseling: Secondary | ICD-10-CM | POA: Diagnosis not present

## 2020-03-30 DIAGNOSIS — E860 Dehydration: Secondary | ICD-10-CM | POA: Diagnosis present

## 2020-03-30 DIAGNOSIS — R040 Epistaxis: Secondary | ICD-10-CM | POA: Diagnosis not present

## 2020-03-30 DIAGNOSIS — K219 Gastro-esophageal reflux disease without esophagitis: Secondary | ICD-10-CM | POA: Diagnosis not present

## 2020-03-30 DIAGNOSIS — R600 Localized edema: Secondary | ICD-10-CM | POA: Diagnosis not present

## 2020-03-30 DIAGNOSIS — R9431 Abnormal electrocardiogram [ECG] [EKG]: Secondary | ICD-10-CM | POA: Diagnosis not present

## 2020-03-30 DIAGNOSIS — Y838 Other surgical procedures as the cause of abnormal reaction of the patient, or of later complication, without mention of misadventure at the time of the procedure: Secondary | ICD-10-CM | POA: Diagnosis not present

## 2020-03-30 DIAGNOSIS — Z8711 Personal history of peptic ulcer disease: Secondary | ICD-10-CM

## 2020-03-30 DIAGNOSIS — B952 Enterococcus as the cause of diseases classified elsewhere: Secondary | ICD-10-CM | POA: Diagnosis not present

## 2020-03-30 DIAGNOSIS — I2694 Multiple subsegmental pulmonary emboli without acute cor pulmonale: Secondary | ICD-10-CM | POA: Diagnosis not present

## 2020-03-30 DIAGNOSIS — F40240 Claustrophobia: Secondary | ICD-10-CM | POA: Diagnosis present

## 2020-03-30 DIAGNOSIS — M1990 Unspecified osteoarthritis, unspecified site: Secondary | ICD-10-CM | POA: Diagnosis present

## 2020-03-30 DIAGNOSIS — Z8349 Family history of other endocrine, nutritional and metabolic diseases: Secondary | ICD-10-CM

## 2020-03-30 DIAGNOSIS — R0602 Shortness of breath: Secondary | ICD-10-CM | POA: Diagnosis not present

## 2020-03-30 DIAGNOSIS — Z7951 Long term (current) use of inhaled steroids: Secondary | ICD-10-CM

## 2020-03-30 DIAGNOSIS — R7401 Elevation of levels of liver transaminase levels: Secondary | ICD-10-CM | POA: Diagnosis present

## 2020-03-30 DIAGNOSIS — Z800 Family history of malignant neoplasm of digestive organs: Secondary | ICD-10-CM

## 2020-03-30 DIAGNOSIS — D696 Thrombocytopenia, unspecified: Secondary | ICD-10-CM | POA: Diagnosis present

## 2020-03-30 DIAGNOSIS — G43809 Other migraine, not intractable, without status migrainosus: Secondary | ICD-10-CM | POA: Diagnosis not present

## 2020-03-30 DIAGNOSIS — F32A Depression, unspecified: Secondary | ICD-10-CM | POA: Diagnosis present

## 2020-03-30 HISTORY — DX: Type 2 diabetes mellitus without complications: E11.9

## 2020-03-30 LAB — CBC WITH DIFFERENTIAL/PLATELET
Abs Immature Granulocytes: 0.03 10*3/uL (ref 0.00–0.07)
Basophils Absolute: 0 10*3/uL (ref 0.0–0.1)
Basophils Relative: 0 %
Eosinophils Absolute: 0 10*3/uL (ref 0.0–0.5)
Eosinophils Relative: 0 %
HCT: 38.5 % (ref 36.0–46.0)
Hemoglobin: 12 g/dL (ref 12.0–15.0)
Immature Granulocytes: 1 %
Lymphocytes Relative: 14 %
Lymphs Abs: 0.5 10*3/uL — ABNORMAL LOW (ref 0.7–4.0)
MCH: 28.1 pg (ref 26.0–34.0)
MCHC: 31.2 g/dL (ref 30.0–36.0)
MCV: 90.2 fL (ref 80.0–100.0)
Monocytes Absolute: 0.3 10*3/uL (ref 0.1–1.0)
Monocytes Relative: 8 %
Neutro Abs: 2.5 10*3/uL (ref 1.7–7.7)
Neutrophils Relative %: 77 %
Platelets: 100 10*3/uL — ABNORMAL LOW (ref 150–400)
RBC: 4.27 MIL/uL (ref 3.87–5.11)
RDW: 14.4 % (ref 11.5–15.5)
WBC: 3.3 10*3/uL — ABNORMAL LOW (ref 4.0–10.5)
nRBC: 0 % (ref 0.0–0.2)

## 2020-03-30 LAB — COMPREHENSIVE METABOLIC PANEL
ALT: 56 U/L — ABNORMAL HIGH (ref 0–44)
AST: 113 U/L — ABNORMAL HIGH (ref 15–41)
Albumin: 3.4 g/dL — ABNORMAL LOW (ref 3.5–5.0)
Alkaline Phosphatase: 68 U/L (ref 38–126)
Anion gap: 10 (ref 5–15)
BUN: 15 mg/dL (ref 6–20)
CO2: 21 mmol/L — ABNORMAL LOW (ref 22–32)
Calcium: 8 mg/dL — ABNORMAL LOW (ref 8.9–10.3)
Chloride: 102 mmol/L (ref 98–111)
Creatinine, Ser: 1.53 mg/dL — ABNORMAL HIGH (ref 0.44–1.00)
GFR, Estimated: 43 mL/min — ABNORMAL LOW (ref >60)
Glucose, Bld: 174 mg/dL — ABNORMAL HIGH (ref 70–99)
Potassium: 3.5 mmol/L (ref 3.5–5.1)
Sodium: 133 mmol/L — ABNORMAL LOW (ref 135–145)
Total Bilirubin: 0.6 mg/dL (ref 0.3–1.2)
Total Protein: 6.7 g/dL (ref 6.5–8.1)

## 2020-03-30 LAB — LIPASE, BLOOD: Lipase: 29 U/L (ref 11–51)

## 2020-03-30 LAB — TROPONIN I (HIGH SENSITIVITY)
Troponin I (High Sensitivity): 14 ng/L (ref <18)
Troponin I (High Sensitivity): 12 ng/L (ref <18)

## 2020-03-30 LAB — D-DIMER, QUANTITATIVE: D-Dimer, Quant: 1.49 ug/mL-FEU — ABNORMAL HIGH (ref 0.00–0.50)

## 2020-03-30 LAB — SARS CORONAVIRUS 2 BY RT PCR (HOSPITAL ORDER, PERFORMED IN ~~LOC~~ HOSPITAL LAB): SARS Coronavirus 2: POSITIVE — AB

## 2020-03-30 LAB — LACTATE DEHYDROGENASE: LDH: 439 U/L — ABNORMAL HIGH (ref 98–192)

## 2020-03-30 LAB — FERRITIN: Ferritin: 581 ng/mL — ABNORMAL HIGH (ref 11–307)

## 2020-03-30 LAB — TRIGLYCERIDES: Triglycerides: 213 mg/dL — ABNORMAL HIGH (ref <150)

## 2020-03-30 LAB — LACTIC ACID, PLASMA
Lactic Acid, Venous: 2.4 mmol/L (ref 0.5–1.9)
Lactic Acid, Venous: 1.6 mmol/L (ref 0.5–1.9)

## 2020-03-30 LAB — FIBRINOGEN: Fibrinogen: 457 mg/dL (ref 210–475)

## 2020-03-30 LAB — PROCALCITONIN: Procalcitonin: 0.23 ng/mL

## 2020-03-30 LAB — C-REACTIVE PROTEIN: CRP: 1.6 mg/dL — ABNORMAL HIGH (ref <1.0)

## 2020-03-30 MED ORDER — FLUTICASONE FUROATE-VILANTEROL 100-25 MCG/INH IN AEPB
1.0000 | INHALATION_SPRAY | Freq: Every day | RESPIRATORY_TRACT | Status: DC
  Administered 2020-03-30 – 2020-04-12 (×13): 1 via RESPIRATORY_TRACT
  Filled 2020-03-30: qty 28

## 2020-03-30 MED ORDER — ONDANSETRON HCL 4 MG/2ML IJ SOLN
4.0000 mg | Freq: Four times a day (QID) | INTRAMUSCULAR | Status: DC | PRN
  Administered 2020-03-31 – 2020-04-10 (×12): 4 mg via INTRAVENOUS
  Filled 2020-03-30 (×13): qty 2

## 2020-03-30 MED ORDER — ALBUTEROL SULFATE HFA 108 (90 BASE) MCG/ACT IN AERS
2.0000 | INHALATION_SPRAY | RESPIRATORY_TRACT | Status: DC | PRN
  Administered 2020-04-01 – 2020-04-08 (×7): 2 via RESPIRATORY_TRACT

## 2020-03-30 MED ORDER — ONDANSETRON HCL 4 MG PO TABS
4.0000 mg | ORAL_TABLET | Freq: Four times a day (QID) | ORAL | Status: DC | PRN
  Administered 2020-04-02: 4 mg via ORAL
  Filled 2020-03-30: qty 1

## 2020-03-30 MED ORDER — PANTOPRAZOLE SODIUM 40 MG PO TBEC
40.0000 mg | DELAYED_RELEASE_TABLET | Freq: Two times a day (BID) | ORAL | Status: DC
  Administered 2020-03-30 – 2020-04-12 (×25): 40 mg via ORAL
  Filled 2020-03-30 (×27): qty 1

## 2020-03-30 MED ORDER — BARICITINIB 2 MG PO TABS
2.0000 mg | ORAL_TABLET | Freq: Every day | ORAL | Status: DC
  Administered 2020-03-30 – 2020-03-31 (×2): 2 mg via ORAL
  Filled 2020-03-30 (×2): qty 1

## 2020-03-30 MED ORDER — ACETAZOLAMIDE 250 MG PO TABS
250.0000 mg | ORAL_TABLET | Freq: Two times a day (BID) | ORAL | Status: DC
  Administered 2020-03-30 – 2020-04-12 (×24): 250 mg via ORAL
  Filled 2020-03-30 (×36): qty 1

## 2020-03-30 MED ORDER — SUMATRIPTAN SUCCINATE 50 MG PO TABS
50.0000 mg | ORAL_TABLET | ORAL | Status: DC | PRN
  Administered 2020-03-30 – 2020-03-31 (×2): 50 mg via ORAL
  Filled 2020-03-30 (×4): qty 1

## 2020-03-30 MED ORDER — ACYCLOVIR 200 MG PO CAPS
200.0000 mg | ORAL_CAPSULE | Freq: Every day | ORAL | Status: DC
  Administered 2020-03-30 – 2020-04-10 (×12): 200 mg via ORAL
  Filled 2020-03-30 (×19): qty 1

## 2020-03-30 MED ORDER — AZELASTINE HCL 0.1 % NA SOLN
2.0000 | Freq: Every day | NASAL | Status: DC
  Administered 2020-04-04 – 2020-04-26 (×14): 2 via NASAL
  Filled 2020-03-30 (×2): qty 30

## 2020-03-30 MED ORDER — LEVOCETIRIZINE DIHYDROCHLORIDE 5 MG PO TABS: 5.0000 mg | ORAL_TABLET | Freq: Every day | ORAL | Status: DC

## 2020-03-30 MED ORDER — SODIUM CHLORIDE 0.9 % IV BOLUS
500.0000 mL | Freq: Once | INTRAVENOUS | Status: AC
  Administered 2020-03-30: 500 mL via INTRAVENOUS

## 2020-03-30 MED ORDER — MECLIZINE HCL 25 MG PO TABS
25.0000 mg | ORAL_TABLET | Freq: Three times a day (TID) | ORAL | Status: DC | PRN
  Filled 2020-03-30: qty 1

## 2020-03-30 MED ORDER — ZINC SULFATE 220 (50 ZN) MG PO CAPS
220.0000 mg | ORAL_CAPSULE | Freq: Every day | ORAL | Status: DC
  Administered 2020-03-30 – 2020-04-18 (×16): 220 mg via ORAL
  Filled 2020-03-30 (×17): qty 1

## 2020-03-30 MED ORDER — IOHEXOL 350 MG/ML SOLN
60.0000 mL | Freq: Once | INTRAVENOUS | Status: AC | PRN
  Administered 2020-03-30: 60 mL via INTRAVENOUS

## 2020-03-30 MED ORDER — REMDESIVIR 100 MG IV SOLR: 100.0000 mg | INTRAVENOUS | Status: AC

## 2020-03-30 MED ORDER — RIZATRIPTAN BENZOATE 5 MG PO TBDP: 5.0000 mg | ORAL_TABLET | Freq: Every day | ORAL | Status: DC | PRN

## 2020-03-30 MED ORDER — BUTALBITAL-APAP-CAFFEINE 50-325-40 MG PO TABS
1.0000 | ORAL_TABLET | Freq: Four times a day (QID) | ORAL | Status: DC | PRN
  Administered 2020-04-01 – 2020-04-09 (×13): 1 via ORAL
  Filled 2020-03-30 (×13): qty 1

## 2020-03-30 MED ORDER — VITAMIN D 25 MCG (1000 UNIT) PO TABS
25.0000 ug | ORAL_TABLET | Freq: Every day | ORAL | Status: DC
  Administered 2020-03-30 – 2020-04-18 (×16): 25 ug via ORAL
  Filled 2020-03-30 (×17): qty 1

## 2020-03-30 MED ORDER — PREDNISONE 20 MG PO TABS
50.0000 mg | ORAL_TABLET | Freq: Every day | ORAL | Status: DC
  Administered 2020-04-02: 50 mg via ORAL
  Filled 2020-03-30: qty 1

## 2020-03-30 MED ORDER — METHYLPREDNISOLONE SODIUM SUCC 125 MG IJ SOLR
1.0000 mg/kg | Freq: Two times a day (BID) | INTRAMUSCULAR | Status: AC
  Administered 2020-03-30 – 2020-04-02 (×6): 122.5 mg via INTRAVENOUS
  Filled 2020-03-30 (×6): qty 2

## 2020-03-30 MED ORDER — ENOXAPARIN SODIUM 60 MG/0.6ML ~~LOC~~ SOLN
60.0000 mg | SUBCUTANEOUS | Status: DC
  Administered 2020-03-30 – 2020-04-06 (×8): 60 mg via SUBCUTANEOUS
  Filled 2020-03-30 (×8): qty 0.6

## 2020-03-30 MED ORDER — ASCORBIC ACID 500 MG PO TABS
500.0000 mg | ORAL_TABLET | Freq: Every day | ORAL | Status: DC
Start: 2020-03-30 — End: 2020-04-18
  Administered 2020-03-30 – 2020-04-18 (×16): 500 mg via ORAL
  Filled 2020-03-30 (×17): qty 1

## 2020-03-30 MED ORDER — FLUOXETINE HCL 20 MG PO CAPS
40.0000 mg | ORAL_CAPSULE | Freq: Every day | ORAL | Status: DC
Start: 2020-03-30 — End: 2020-04-18
  Administered 2020-03-30 – 2020-04-18 (×16): 40 mg via ORAL
  Filled 2020-03-30 (×17): qty 2

## 2020-03-30 MED ORDER — GUAIFENESIN-DM 100-10 MG/5ML PO SYRP
10.0000 mL | ORAL_SOLUTION | ORAL | Status: DC | PRN
  Administered 2020-03-31 – 2020-04-13 (×27): 10 mL via ORAL
  Filled 2020-03-30 (×26): qty 10

## 2020-03-30 MED ORDER — DIAZEPAM 5 MG PO TABS
5.0000 mg | ORAL_TABLET | Freq: Four times a day (QID) | ORAL | Status: DC | PRN
  Administered 2020-03-31: 5 mg via ORAL
  Filled 2020-03-30: qty 1

## 2020-03-30 MED ORDER — DEXAMETHASONE SODIUM PHOSPHATE 10 MG/ML IJ SOLN
10.0000 mg | Freq: Once | INTRAMUSCULAR | Status: AC
  Administered 2020-03-30: 10 mg via INTRAVENOUS
  Filled 2020-03-30: qty 1

## 2020-03-30 MED ORDER — PRAVASTATIN SODIUM 40 MG PO TABS: 40.0000 mg | ORAL_TABLET | Freq: Every day | ORAL | Status: DC

## 2020-03-30 MED ORDER — SODIUM CHLORIDE 0.9 % IV SOLN: 100.0000 mg | Freq: Every day | INTRAVENOUS | Status: DC

## 2020-03-30 MED ORDER — LORATADINE 10 MG PO TABS
10.0000 mg | ORAL_TABLET | Freq: Every day | ORAL | Status: DC
  Administered 2020-03-30 – 2020-04-10 (×12): 10 mg via ORAL
  Filled 2020-03-30 (×13): qty 1

## 2020-03-30 MED ORDER — ZINC GLUCONATE 50 MG PO TABS: 50.0000 mg | ORAL_TABLET | Freq: Every day | ORAL | Status: DC

## 2020-03-30 MED ORDER — PREGABALIN 75 MG PO CAPS
150.0000 mg | ORAL_CAPSULE | Freq: Every day | ORAL | Status: DC
  Administered 2020-03-30 – 2020-04-14 (×15): 150 mg via ORAL
  Filled 2020-03-30 (×15): qty 2

## 2020-03-30 MED ORDER — ALBUTEROL SULFATE HFA 108 (90 BASE) MCG/ACT IN AERS
INHALATION_SPRAY | RESPIRATORY_TRACT | Status: AC
  Administered 2020-03-30: 2 via RESPIRATORY_TRACT
  Filled 2020-03-30: qty 6.7

## 2020-03-30 NOTE — ED Notes (Signed)
Date and time results received: Apr 04, 2020 3:07 PM  (use smartphrase ".now" to insert current time)  Test: lactic Critical Value: 2.4  Name of Provider Notified: PA Olevia Bowens  Orders Received? Or Actions Taken?: N/a

## 2020-03-30 NOTE — H&P (Addendum)
TRH H&P   Patient Demographics:    Sandra [name], is a 45 y.o. female  MRN: [id number]   DOB - 1975/09/21  Admit Date - 04/18/2020  Outpatient Primary MD for the patient is Glenda Chroman, MD  Referring MD/NP/PA: Haynes Kerns  Patient coming from: Home  Chief Complaint  Patient presents with  . Shortness of Breath      HPI:     Patient  was seen and examined with the presence of female chaperone her RN [facility] [facility]  is a 45 y.o. female, nickel history of GERD,vertigo, depression, anxiety, pseudotumor cerebri, benign intracranial hypertension, with VP shunt, patient is unvaccinated against Covid, she presents with shortness of breath, cough, patient diagnosed with COVID-19 7 days ago with a home kit, she reports cough, shortness of breath, fatigue and body ache, but her symptoms continue to worsen, report dyspnea has worsened over the last 2 to 3 days, as well he reports chest pain related to deep breathing, and coughing, prompted her to come to ED. - in ED patient saturation 73% on room air, she is currently on 15 L high flow nasal cannula, CTA chest negative for PE, but significant for severe COVID-19 of pneumonia, creatinine elevated at 1.5, AST elevated at 113, [facility] consulted to admit.   Review of systems:    In addition to the HPI above,  No Fever-chills, No Headache, No changes with Vision or hearing, No problems swallowing food or Liquids, No Chest pain, Cough or Shortness of Breath, No Abdominal pain, No Nausea or Vommitting, Bowel movements are regular, No Blood in stool or Urine, No dysuria, No new skin rashes or bruises, No new joints pains-aches,  No new weakness, tingling, numbness in any extremity, No recent weight gain or loss, No polyuria, polydypsia or polyphagia, No significant Mental Stressors.  A full 10 point Review of Systems  was done, except as stated above, all other Review of Systems were negative.   With Past History of the following :    Past Medical History:  Diagnosis Date  . Anxiety   . Arthritis   . Chronic back pain   . Chronic migraine   . Constipation   . Depression   . Fibromyalgia   . GERD (gastroesophageal reflux disease)   . History of bronchitis   . History of cardiac catheterization    Normal coronaries 2007  . History of gastric ulcer   . History of hiatal hernia   . History of pneumonia   . Hypertension   . Idiopathic intracranial hypertension   . POTS (postural orthostatic tachycardia syndrome)   . Seasonal allergies       Past Surgical History:  Procedure Laterality Date  . ABDOMINAL HYSTERECTOMY    . BACK SURGERY    . BLADDER SURGERY    . BREAST SURGERY Left    Lumpectomy  .  CARDIAC CATHETERIZATION  2010  . CHOLECYSTECTOMY    . COLONOSCOPY  2003   Moderate external hemorrhoids with normal TI  . CYSTOSCOPY W/ URETERAL STENT REMOVAL    . ESOPHAGEAL DILATION     4-5 times  . ESOPHAGOGASTRODUODENOSCOPY  2003   Small sliding hiatal hernia, erosive gastritis  . ESOPHAGOGASTRODUODENOSCOPY  2004   Erosive reflux esophagitis with Schatzki ring s/p dilation, erosive antral gastritis  . ESOPHAGOGASTRODUODENOSCOPY  2006   Non-critical Schatzki ring proximal to GEJ, s/p 54 and 56 Maloney dilatation. ?Candida esophagitis  . ESOPHAGOGASTRODUODENOSCOPY  2009   Small hiatal hernia  . LUMBAR LAMINECTOMY/DECOMPRESSION MICRODISCECTOMY Left 01/11/2014   Procedure: LUMBAR LAMINECTOMY/DECOMPRESSION MICRODISCECTOMY 1 LEVEL LEFT LUMBAR TWO/THREE;  Surgeon: Newman Pies, MD;  Location: [facility];  Service: Neurosurgery;  Laterality: Left;  . NASAL SEPTOPLASTY W/ TURBINOPLASTY Bilateral 09/09/2019   Procedure: NASAL SEPTOPLASTY WITH BILATERAL TURBINATE REDUCTION;  Surgeon: Leta Baptist, MD;  Location: [facility];  Service: ENT;  Laterality: Bilateral;  . TUBAL LIGATION     . ventrical atrium shunt  04/2018  . VENTRICULOPERITONEAL SHUNT Right 11/19/2015   Procedure: VENTRICULAR-PERITONEAL SHUNT RIGHT;  Surgeon: Newman Pies, MD;  Location: [facility];  Service: Neurosurgery;  Laterality: Right;  Right      Social History:     Social History   Tobacco Use  . Smoking status: Never Smoker  . Smokeless tobacco: Never Used  Substance Use Topics  . Alcohol use: No    Alcohol/week: 0.0 standard drinks       Family History :     Family History  Problem Relation Age of Onset  . Kidney cancer Mother   . Diabetes Mother   . Hypertension Mother   . Thyroid disease Mother   . Ulcers Father   . Coronary artery disease Father        Premature CAD  . Hypertension Father   . Prostate cancer Maternal Grandfather   . Pancreatic cancer Maternal Grandmother   . Lung cancer Paternal Grandmother   . Aortic aneurysm Other   . Colon cancer Neg Hx      Home Medications:   Prior to Admission medications   Medication Sig Start Date End Date Taking? Authorizing Provider  acetaminophen (TYLENOL) 500 MG tablet Take 1,000 mg by mouth every 6 (six) hours as needed.   Yes [provider]  acetaZOLAMIDE (DIAMOX) 250 MG tablet Take 250 mg by mouth 2 (two) times daily. 11/18/19  Yes [provider]  acyclovir (ZOVIRAX) 200 MG capsule Take 200 mg by mouth daily. 03/09/20  Yes [provider]  albuterol (VENTOLIN HFA) 108 (90 Base) MCG/ACT inhaler Inhale into the lungs every 6 (six) hours as needed for wheezing or shortness of breath.   Yes [provider]  Ascorbic Acid (VITAMIN C) 1000 MG tablet Take 2,000 mg by mouth daily.   Yes [provider]  atenolol (TENORMIN) 25 MG tablet Take 1 tablet (25 mg total) by mouth daily. 01/28/19 09/02/19 Yes Satira Sark, MD  Azelastine HCl 137 MCG/SPRAY SOLN Place 2 puffs into both nostrils daily. 01/13/19  Yes [provider]  BREO ELLIPTA 100-25 MCG/INH AEPB Inhale 1 puff  into the lungs daily. 01/13/19  Yes [provider]  butalbital-acetaminophen-caffeine (FIORICET, ESGIC) 50-325-40 MG tablet Take 1 tablet by mouth every 6 (six) hours as needed for headache. 04/06/15  Yes Barton Dubois, MD  diazepam (VALIUM) 5 MG tablet Take 5 mg by mouth every 6 (six) hours  as needed for anxiety.   Yes [provider]  diphenhydrAMINE (BENADRYL) 25 mg capsule Take 25 mg by mouth every 6 (six) hours as needed for itching.   Yes [provider]  FLUoxetine (PROZAC) 40 MG capsule Take 40 mg by mouth daily. 03/04/20  Yes [provider]  furosemide (LASIX) 20 MG tablet Take by mouth. 12/08/18  Yes [provider]  hydrochlorothiazide (HYDRODIURIL) 12.5 MG tablet Take 12.5 mg by mouth daily.  06/08/17  Yes [provider]  levocetirizine (XYZAL) 5 MG tablet Take 1 tablet by mouth daily. 06/21/19  Yes [provider]  meclizine (ANTIVERT) 25 MG tablet Take 1 tablet (25 mg total) by mouth 3 (three) times daily as needed for dizziness. 08/11/14  Yes Melvenia Beam, MD  Multiple Vitamins-Minerals (EMERGEN-C IMMUNE PO) Take 1 packet by mouth daily.   Yes [provider]  pantoprazole (PROTONIX) 40 MG tablet Take 1 tablet (40 mg total) by mouth 2 (two) times daily. 12/20/18  Yes Aliene Altes S, PA-C  pravastatin (PRAVACHOL) 40 MG tablet Take 40 mg by mouth daily.   Yes [provider]  predniSONE (STERAPRED UNI-PAK 21 TAB) 5 MG (21) TBPK tablet See admin instructions. 03/26/20  Yes [provider]  pregabalin (LYRICA) 300 MG capsule Take 150 mg by mouth at bedtime.    Yes [provider]  promethazine (PHENERGAN) 25 MG tablet Take 25 mg by mouth every 6 (six) hours as needed for nausea or vomiting.   Yes [provider]  rizatriptan (MAXALT-MLT) 5 MG disintegrating tablet Take 5 mg by mouth daily as needed for migraine. May repeat in 2 hours if needed   Yes [provider]   Vitamin D, Cholecalciferol, 25 MCG (1000 UT) TABS Take 1 tablet by mouth daily as needed.   Yes [provider]  zinc gluconate 50 MG tablet Take 50 mg by mouth daily.   Yes [provider]  azithromycin (ZITHROMAX) 250 MG tablet Take by mouth. Patient not taking: No sig reported 03/26/20   [provider]  benzonatate (TESSALON) 100 MG capsule Take 200 mg by mouth every 8 (eight) hours as needed. 03/26/20   [provider]  doxycycline (ADOXA) 100 MG tablet Take 100 mg by mouth 2 (two) times daily. Patient not taking: No sig reported 01/24/20   [provider]  fluconazole (DIFLUCAN) 150 MG tablet Take 150 mg by mouth once. Patient not taking: No sig reported 10/13/19   [provider]  HYDROcodone-acetaminophen (NORCO/VICODIN) 5-325 MG tablet Take by mouth. Patient not taking: No sig reported 07/04/19   [provider]  hydrocortisone (ANUSOL-HC) 2.5 % rectal cream Place 1 application rectally 2 (two) times daily. Patient not taking: No sig reported 03/17/19   Erenest Rasher, PA-C  levofloxacin (LEVAQUIN) 500 MG tablet Take 500 mg by mouth daily. Patient not taking: No sig reported 10/11/19   [provider]  lubiprostone (AMITIZA) 8 MCG capsule Take 1 capsule (8 mcg total) by mouth 2 (two) times daily with a meal. Patient not taking: No sig reported 12/20/18   Erenest Rasher, PA-C  nystatin (MYCOSTATIN) 100000 UNIT/ML suspension Take by mouth. Patient not taking: No sig reported 01/06/20   [provider]  tetrahydrozoline 0.05 % ophthalmic solution Place 1 drop into both eyes as needed (For allergies). Patient not taking: Reported on 04/14/2020    [provider]  tiZANidine (ZANAFLEX) 2 MG tablet Take 2 mg by mouth 3 (three) times daily.  Patient not taking: No sig reported 11/18/19   [provider]     Allergies:     Allergies  Allergen Reactions  . Ambien [Zolpidem Tartrate] Rash and  Other (See Comments)    Pt states she cannot walk after taking this medication  . Penicillins Hives and Swelling    Has patient had a PCN reaction causing immediate rash, facial/tongue/throat swelling, SOB or lightheadedness with hypotension: Yes Has patient had a PCN reaction causing severe rash involving mucus membranes or skin necrosis: Yes Has patient had a PCN reaction that required hospitalization Yes Has patient had a PCN reaction occurring within the last 10 years: Yes If all of the above answers are "NO", then may proceed with Cephalosporin use.   . Topamax [Topiramate] Other (See Comments)    Gives her kidney stones  . Hydrocodone Itching  . Chlorhexidine Gluconate Itching and Rash    "It felt bees were stinging me"  . Latex Rash  . Versed [Midazolam] Itching     Physical Exam:   Vitals  Blood pressure 111/84, pulse 81, temperature (!) 100.5 F (38.1 C), temperature source Oral, resp. rate 16, height '5\' 7"'  (1.702 m), weight 122.5 kg, SpO2 91 %.   1. General developed female, laying in bed, in no apparent distress  2. Normal affect and insight, Not Suicidal or Homicidal, Awake Alert, Oriented X 3.  3. No F.N deficits, ALL C.Nerves Intact, Strength 5/5 all 4 extremities, Sensation intact all 4 extremities, Plantars down going.  4. Ears and Eyes appear Normal, Conjunctivae clear, PERRLA. Moist Oral Mucosa.  5. Supple Neck, No JVD, No cervical lymphadenopathy appriciated, No Carotid Bruits.  6. Symmetrical Chest wall movement, Good air movement bilaterally, CTAB.  7. RRR, No Gallops, Rubs or Murmurs, No Parasternal Heave.  8. Positive Bowel Sounds, Abdomen Soft, No tenderness, No organomegaly appriciated,No rebound -guarding or rigidity.  9.  No Cyanosis, Normal Skin Turgor, No Skin Rash or Bruise.  10. Good muscle tone,  joints appear normal , no effusions, Normal ROM.  11. No Palpable Lymph Nodes in Neck or Axillae     Data Review:    CBC Recent Labs  Lab  04/01/2020 1355  WBC 3.3*  HGB 12.0  HCT 38.5  PLT 100*  MCV 90.2  MCH 28.1  MCHC 31.2  RDW 14.4  LYMPHSABS 0.5*  MONOABS 0.3  EOSABS 0.0  BASOSABS 0.0   ------------------------------------------------------------------------------------------------------------------  Chemistries  Recent Labs  Lab 04/01/2020 1355  NA 133*  K 3.5  CL 102  CO2 21*  GLUCOSE 174*  BUN 15  CREATININE 1.53*  CALCIUM 8.0*  AST 113*  ALT 56*  ALKPHOS 68  BILITOT 0.6   ------------------------------------------------------------------------------------------------------------------ estimated creatinine clearance is 63 mL/min (A) (by C-G formula based on SCr of 1.53 mg/dL (H)). ------------------------------------------------------------------------------------------------------------------ No results for input(s): TSH, T4TOTAL, T3FREE, THYROIDAB in the last 72 hours.  Invalid input(s): FREET3  Coagulation profile No results for input(s): INR, PROTIME in the last 168 hours. ------------------------------------------------------------------------------------------------------------------- Recent Labs    04/14/2020 1355  DDIMER 1.49*   -------------------------------------------------------------------------------------------------------------------  Cardiac Enzymes No results for input(s): CKMB, TROPONINI, MYOGLOBIN in the last 168 hours.  Invalid input(s): CK ------------------------------------------------------------------------------------------------------------------ No results found for: BNP   ---------------------------------------------------------------------------------------------------------------  Urinalysis    Component Value Date/Time   COLORURINE YELLOW 03/16/2019 1236   APPEARANCEUR HAZY (A) 03/16/2019 1236   LABSPEC 1.032 (H) 03/16/2019 1236   PHURINE 5.0 03/16/2019 [place] 03/16/2019 1236   [employer] 03/16/2019 1236  BILIRUBINUR NEGATIVE  03/16/2019 [place] 03/16/2019 [place] 03/16/2019 1236   UROBILINOGEN 0.2 02/03/2014 1455   NITRITE NEGATIVE 03/16/2019 1236   [place] 03/16/2019 1236    ----------------------------------------------------------------------------------------------------------------   Imaging Results:    CT Angio Chest PE W and/or Wo Contrast  Result Date: 04/13/2020 CLINICAL DATA:  COVID positive. Hemoptysis, hypoxia. Rule out pulmonary embolism. EXAM: CT ANGIOGRAPHY CHEST WITH CONTRAST TECHNIQUE: Multidetector CT imaging of the chest was performed using the standard protocol during bolus administration of intravenous contrast. Multiplanar CT image reconstructions and MIPs were obtained to evaluate the vascular anatomy. CONTRAST:  71m OMNIPAQUE IOHEXOL 350 MG/ML SOLN COMPARISON:  Chest 04/02/2020.  CT chest 11/22/2018 FINDINGS: Cardiovascular: Negative for pulmonary embolism. Pulmonary arteries normal in caliber. Normal aortic arch. Heart size normal. No pericardial effusion Mediastinum/Nodes: Negative for mass or adenopathy Lungs/Pleura: Extensive bilateral airspace disease with patchy and confluent areas of airspace infiltrate more severe in the upper lobes than lower lobes. No effusion. Upper Abdomen: Negative Musculoskeletal: Negative Review of the MIP images confirms the above findings. IMPRESSION: Negative for pulmonary embolism Extensive bilateral airspace disease compatible with widespread COVID pneumonia. Electronically Signed   By: CFranchot GalloM.D.   On: 04/18/2020 15:24   DG Chest Port 1 View  Result Date: 04/17/2020 CLINICAL DATA:  [place] EXAM: PORTABLE CHEST 1 VIEW COMPARISON:  August 16, 2019 FINDINGS: The cardiomediastinal silhouette is unchanged in contour. No pleural effusion. No pneumothorax. Multifocal bilateral peripheral predominant heterogeneous opacities, consistent with the sequela of COVID-19 infection. Status post cholecystectomy. No acute  osseous abnormality. IMPRESSION: Multifocal bilateral peripheral predominant heterogeneous opacities, consistent with the sequela of COVID-19 infection. Electronically Signed   By: SValentino SaxonMD   On: 03/28/2020 14:39    My personal review of EKG: Rhythm NSR, Rate  91 /min, QTc 466   Assessment & Plan:    Active Problems:   Morbid obesity (HCC)   GERD (gastroesophageal reflux disease)   Pseudotumor cerebri   Hypotension   Acute renal failure (HCC)   Headache, migraine   Acute respiratory disease due to COVID-19 virus  Acute hypoxic respiratory failure due to COVID-19 pneumonia -Unfortunately she is unvaccinated. -She sustained severe parenchymal injury as evident on her CT chest, she is with view of COVID-19 of pneumonia, with significant oxygen requirement, she is currently on 15 L high flow nasal cannula. -She was encouraged use incentive spirometry, flutter valve, out of bed to chair/ -She will be started on IV Solu-Medrol. -Patient declined Remdesivir(did talk to patient, and her daughter by phone, answered their questions and concerns about Remdesivir) -Patient denies any history of malignancy, or being treated with biologic, no history of left colitis, tuberculosis or viral hepatitis, discussed baricitinib with her, understands risks and benefits, agreeing to proceed, she will be started on baricitinib. -Continue to monitor inflammatory markers closely  AKI -due to volume depletion and dehydration, will hold hydrochlorothiazide and Lasix for now, avoid nephrotoxic medications  Chest pain -Musculoskeletal, related to cough, CTA negative for PE, EKG nonacute, troponins negative x2.  Hypertension -blood Pressure is soft, hold home medications  Transaminitis -Due to Covid, continue to monitor closely, will hold statin if trending up.  pseudotumor cerebri -continue diamox  GERD: -Continue with Protonix  depression/anxiety: -continue prozac and valium    obesity -BMI of 42.29  Hyperlipidemia -Hold statin given AST at 113  History of migraine/headache -Continue with Fioricet and rizatriptan as needed   DVT Prophylaxis   Lovenox  AM Labs Ordered, also please review Full Orders  Family Communication: Admission, patients condition and plan of care including tests being ordered have been discussed with the patient and daughter by phone who indicate understanding and agree with the plan and Code Status.  Code Status Full  Likely DC to  home  Condition GUARDED    Consults called: none    Admission status: inpatient  Time spent in minutes : 65 minutes   Phillips Climes M.D on 03/29/2020 at 6:09 PM   [facility] - Office  [phone]

## 2020-03-30 NOTE — ED Notes (Addendum)
Per pt. She wants to not take remdesivir at this time. Daughter Delorise Jackson) asked to speak with Dr. Randol Kern. Notified Dr. Randol Kern and provided phone number so that daughter could speak with him.

## 2020-03-30 NOTE — ED Triage Notes (Signed)
Pt to er, pt states that she is covid positive, states that she has been checking her oxygen at home and came in because she feels like she can't breath and her O2 sat was 81%, pt talking in short sentences, pt 73 on room air, pt placed on 6L via [place], pt 88 on 6L via [place]

## 2020-03-30 NOTE — ED Provider Notes (Signed)
[facility] EMERGENCY DEPARTMENT Provider Note   CSN: [id number] Arrival date & time: 03/29/2020  1325     History Chief Complaint  Patient presents with  . Shortness of Breath    Sandra [name] is a 45 y.o. female history of obesity, chronic pain, GERD, POTS, chronic bronchitis, hysterectomy.  Patient reports she is unvaccinated against Covid, 7 days ago she developed cough, fatigue and body aches.  She reports that same day she took a home test which showed that she was positive for COVID-19.  Her symptoms continue to worsen, she reports her shortness of breath has been severe over the past 2-3 days and she also reports chest pain which is a sharp tight sensation worse with deep breathing and coughing pain does not radiate and involves her entire chest there are no alleviating factors.  She reports this is associated with some scant hemoptysis.  Patient noted to be hypoxic with SPO2 73% on room air.  She was placed on 6 L nasal cannula on arrival.  On my initial evaluation patient is on 15 L nonrebreather.  Denies headache, head injury, sore throat, neck stiffness, abdominal pain, vomiting, diarrhea, dysuria/hematuria, extremity swelling/color change or any additional concerns  HPI     Past Medical History:  Diagnosis Date  . Anxiety   . Arthritis   . Chronic back pain   . Chronic migraine   . Constipation   . Depression   . Fibromyalgia   . GERD (gastroesophageal reflux disease)   . History of bronchitis   . History of cardiac catheterization    Normal coronaries 2007  . History of gastric ulcer   . History of hiatal hernia   . History of pneumonia   . Hypertension   . Idiopathic intracranial hypertension   . POTS (postural orthostatic tachycardia syndrome)   . Seasonal allergies     Patient Active Problem List   Diagnosis Date Noted  . Acute respiratory disease due to COVID-19 virus 04/19/2020  . Bloody diarrhea 03/16/2019  . Constipation 12/20/2018  . Dysphagia  03/17/2018  . Rectal bleeding 03/17/2018  . Abdominal pain 07/15/2017  . Fever, unspecified 07/15/2017  . Shunt malfunction 11/19/2015  . Acute renal failure (HCC)   . Headache, migraine   . POTS (postural orthostatic tachycardia syndrome) 04/04/2015  . Hypotension 04/04/2015  . SIRS (systemic inflammatory response syndrome) (HCC) 04/04/2015  . Pyrexia   . Pseudotumor cerebri 02/03/2014  . Possible Bacterial meningitis 02/03/2014  . Lumbar herniated disc 01/11/2014  . Morbid obesity (HCC) 01/21/2010  . INTERMEDIATE CORONARY SYNDROME 01/21/2010  . Headache, spinal, postoperative 01/21/2010  . UNSPECIFIED TACHYCARDIA 01/21/2010  . PALPITATIONS 01/21/2010  . PRECORDIAL PAIN 01/21/2010  . GERD (gastroesophageal reflux disease) 01/16/2010  . NAUSEA WITH VOMITING 01/16/2010  . DYSPHAGIA UNSPECIFIED 01/16/2010  . ABDOMINAL PAIN, EPIGASTRIC 01/16/2010    Past Surgical History:  Procedure Laterality Date  . ABDOMINAL HYSTERECTOMY    . BACK SURGERY    . BLADDER SURGERY    . BREAST SURGERY Left    Lumpectomy  . CARDIAC CATHETERIZATION  2010  . CHOLECYSTECTOMY    . COLONOSCOPY  2003   Moderate external hemorrhoids with normal TI  . CYSTOSCOPY W/ URETERAL STENT REMOVAL    . ESOPHAGEAL DILATION     4-5 times  . ESOPHAGOGASTRODUODENOSCOPY  2003   Small sliding hiatal hernia, erosive gastritis  . ESOPHAGOGASTRODUODENOSCOPY  2004   Erosive reflux esophagitis with Schatzki ring s/p dilation, erosive antral gastritis  . ESOPHAGOGASTRODUODENOSCOPY  2006  Non-critical Schatzki ring proximal to GEJ, s/p 54 and 56 Maloney dilatation. ?Candida esophagitis  . ESOPHAGOGASTRODUODENOSCOPY  2009   Small hiatal hernia  . LUMBAR LAMINECTOMY/DECOMPRESSION MICRODISCECTOMY Left 01/11/2014   Procedure: LUMBAR LAMINECTOMY/DECOMPRESSION MICRODISCECTOMY 1 LEVEL LEFT LUMBAR TWO/THREE;  Surgeon: Tressie Stalker, MD;  Location: [facility];  Service: Neurosurgery;  Laterality: Left;  . NASAL SEPTOPLASTY  W/ TURBINOPLASTY Bilateral 09/09/2019   Procedure: NASAL SEPTOPLASTY WITH BILATERAL TURBINATE REDUCTION;  Surgeon: Newman Pies, MD;  Location: [facility];  Service: ENT;  Laterality: Bilateral;  . TUBAL LIGATION    . ventrical atrium shunt  04/2018  . VENTRICULOPERITONEAL SHUNT Right 11/19/2015   Procedure: VENTRICULAR-PERITONEAL SHUNT RIGHT;  Surgeon: Tressie Stalker, MD;  Location: [facility];  Service: Neurosurgery;  Laterality: Right;  Right     OB History   No obstetric history on file.     Family History  Problem Relation Age of Onset  . Kidney cancer Mother   . Diabetes Mother   . Hypertension Mother   . Thyroid disease Mother   . Ulcers Father   . Coronary artery disease Father        Premature CAD  . Hypertension Father   . Prostate cancer Maternal Grandfather   . Pancreatic cancer Maternal Grandmother   . Lung cancer Paternal Grandmother   . Aortic aneurysm Other   . Colon cancer Neg Hx     Social History   Tobacco Use  . Smoking status: Never Smoker  . Smokeless tobacco: Never Used  Substance Use Topics  . Alcohol use: No    Alcohol/week: 0.0 standard drinks  . Drug use: No    Home Medications Prior to Admission medications   Medication Sig Start Date End Date Taking? Authorizing Provider  furosemide (LASIX) 20 MG tablet Take by mouth. 12/08/18  Yes [provider]  HYDROcodone-acetaminophen (NORCO/VICODIN) 5-325 MG tablet Take by mouth. 07/04/19  Yes [provider]  levocetirizine (XYZAL) 5 MG tablet Take 1 tablet by mouth daily. 06/21/19  Yes [provider]  povidone-iodine (BETADINE) 10 % external solution PAINT STAPLE LINE TWICE DAILY UNTIL STAPLES ARE REMOVED. 04/06/19 04/05/20 Yes [provider]  acetaZOLAMIDE (DIAMOX) 250 MG tablet Take 250 mg by mouth 2 (two) times daily. 11/18/19   [provider]  acyclovir (ZOVIRAX) 200 MG capsule Take 200 mg by mouth daily. 03/09/20   [provider]   albuterol (VENTOLIN HFA) 108 (90 Base) MCG/ACT inhaler Inhale into the lungs every 6 (six) hours as needed for wheezing or shortness of breath.    [provider]  atenolol (TENORMIN) 25 MG tablet Take 1 tablet (25 mg total) by mouth daily. 01/28/19 09/02/19  Jonelle Sidle, MD  Azelastine HCl 137 MCG/SPRAY SOLN Place 2 puffs into both nostrils daily. 01/13/19   [provider]  azithromycin (ZITHROMAX) 250 MG tablet Take by mouth. 03/26/20   [provider]  benzonatate (TESSALON) 100 MG capsule Take 200 mg by mouth every 8 (eight) hours as needed. 03/26/20   [provider]  BREO ELLIPTA 100-25 MCG/INH AEPB Inhale 1 puff into the lungs daily. 01/13/19   [provider]  butalbital-acetaminophen-caffeine (FIORICET, ESGIC) 50-325-40 MG tablet Take 1 tablet by mouth every 6 (six) hours as needed for headache. 04/06/15   Vassie Loll, MD  cetirizine (ZYRTEC) 10 MG tablet Take 10 mg by mouth daily.     [provider]  Dextromethorphan-guaiFENesin  [facility] DM PO) Take by mouth as needed.  [provider]  diazepam (VALIUM) 5 MG tablet Take 5 mg by mouth every 6 (six) hours as needed for anxiety.    [provider]  diphenhydrAMINE (BENADRYL) 25 mg capsule Take 25 mg by mouth every 6 (six) hours as needed for itching.    [provider]  doxycycline (ADOXA) 100 MG tablet Take 100 mg by mouth 2 (two) times daily. 01/24/20   [provider]  fluconazole (DIFLUCAN) 150 MG tablet Take 150 mg by mouth once. 10/13/19   [provider]  FLUoxetine (PROZAC) 40 MG capsule Take 40 mg by mouth daily. 03/04/20   [provider]  hydrochlorothiazide (HYDRODIURIL) 12.5 MG tablet Take 12.5 mg by mouth daily.  06/08/17   [provider]  hydrocortisone (ANUSOL-HC) 2.5 % rectal cream Place 1 application rectally 2 (two) times daily. 03/17/19   Letta Median, PA-C  levofloxacin (LEVAQUIN) 500 MG tablet  Take 500 mg by mouth daily. 10/11/19   [provider]  lubiprostone (AMITIZA) 8 MCG capsule Take 1 capsule (8 mcg total) by mouth 2 (two) times daily with a meal. 12/20/18   Letta Median, PA-C  meclizine (ANTIVERT) 25 MG tablet Take 1 tablet (25 mg total) by mouth 3 (three) times daily as needed for dizziness. 08/11/14   Anson Fret, MD  nystatin (MYCOSTATIN) 100000 UNIT/ML suspension Take by mouth. 01/06/20   [provider]  pantoprazole (PROTONIX) 40 MG tablet Take 1 tablet (40 mg total) by mouth 2 (two) times daily. 12/20/18   Letta Median, PA-C  pravastatin (PRAVACHOL) 40 MG tablet Take 40 mg by mouth daily.    [provider]  predniSONE (STERAPRED UNI-PAK 21 TAB) 5 MG (21) TBPK tablet See admin instructions. 03/26/20   [provider]  pregabalin (LYRICA) 300 MG capsule Take 150 mg by mouth at bedtime.     [provider]  promethazine (PHENERGAN) 25 MG tablet Take 25 mg by mouth every 6 (six) hours as needed for nausea or vomiting.    [provider]  rizatriptan (MAXALT-MLT) 5 MG disintegrating tablet Take 5 mg by mouth daily as needed for migraine. May repeat in 2 hours if needed    [provider]  tetrahydrozoline (VISINE) 0.05 % ophthalmic solution Place 1 drop into both eyes as needed (For allergies).    [provider]  tiZANidine (ZANAFLEX) 2 MG tablet Take 2 mg by mouth 3 (three) times daily. 11/18/19   [provider]    Allergies    Ambien [zolpidem tartrate], Penicillins, Topamax [topiramate], Hydrocodone, Chlorhexidine gluconate, Latex, and Versed [midazolam]  Review of Systems   Review of Systems Ten systems are reviewed and are negative for acute change except as noted in the HPI  Physical Exam Updated Vital Signs BP 111/84   Pulse 81   Temp (!) 100.5 F (38.1 C) (Oral)   Resp 16   Ht 5\' 7"  (1.702 m)   Wt 122.5 kg   SpO2 91%   BMI 42.29 kg/m   Physical  Exam Constitutional:      General: She is not in acute distress.    Appearance: Normal appearance. She is well-developed. She is obese. She is ill-appearing. She is not diaphoretic.  HENT:     Head: Normocephalic and atraumatic.  Eyes:     General: Vision grossly intact. Gaze aligned appropriately.     Pupils: Pupils are equal, round, and reactive to light.  Neck:     Trachea: Trachea and phonation  normal.  Cardiovascular:     Rate and Rhythm: Regular rhythm. Tachycardia present.  Pulmonary:     Effort: Pulmonary effort is normal. Tachypnea present. No respiratory distress.  Abdominal:     General: There is no distension.     Palpations: Abdomen is soft.     Tenderness: There is no abdominal tenderness. There is no guarding or rebound.  Musculoskeletal:        General: Normal range of motion.     Cervical back: Normal range of motion.     Right lower leg: No tenderness. No edema.     Left lower leg: No tenderness. No edema.  Skin:    General: Skin is warm and dry.  Neurological:     Mental Status: She is alert.     GCS: GCS eye subscore is 4. GCS verbal subscore is 5. GCS motor subscore is 6.     Comments: Speech is clear and goal oriented, follows commands Major Cranial nerves without deficit, no facial droop Moves extremities without ataxia, coordination intact  Psychiatric:        Behavior: Behavior normal.     ED Results / Procedures / Treatments   Labs (all labs ordered are listed, but only abnormal results are displayed) Labs Reviewed  SARS CORONAVIRUS 2 BY RT PCR (HOSPITAL ORDER, PERFORMED IN [facility]) - Abnormal; Notable for the following components:      Result Value   SARS Coronavirus 2 POSITIVE (*)    All other components within normal limits  LACTIC ACID, PLASMA - Abnormal; Notable for the following components:   Lactic Acid, Venous 2.4 (*)    All other components within normal limits  CBC WITH DIFFERENTIAL/PLATELET - Abnormal; Notable for  the following components:   WBC 3.3 (*)    Platelets 100 (*)    Lymphs Abs 0.5 (*)    All other components within normal limits  COMPREHENSIVE METABOLIC PANEL - Abnormal; Notable for the following components:   Sodium 133 (*)    CO2 21 (*)    Glucose, Bld 174 (*)    Creatinine, Ser 1.53 (*)    Calcium 8.0 (*)    Albumin 3.4 (*)    AST 113 (*)    ALT 56 (*)    GFR, Estimated 43 (*)    All other components within normal limits  D-DIMER, QUANTITATIVE (NOT AT [facility]) - Abnormal; Notable for the following components:   D-Dimer, Quant 1.49 (*)    All other components within normal limits  LACTATE DEHYDROGENASE - Abnormal; Notable for the following components:   LDH 439 (*)    All other components within normal limits  FERRITIN - Abnormal; Notable for the following components:   Ferritin 581 (*)    All other components within normal limits  TRIGLYCERIDES - Abnormal; Notable for the following components:   Triglycerides 213 (*)    All other components within normal limits  C-REACTIVE PROTEIN - Abnormal; Notable for the following components:   CRP 1.6 (*)    All other components within normal limits  CULTURE, BLOOD (ROUTINE X 2)  CULTURE, BLOOD (ROUTINE X 2)  PROCALCITONIN  FIBRINOGEN  LIPASE, BLOOD  LACTIC ACID, PLASMA  POC URINE PREG, ED  TROPONIN I (HIGH SENSITIVITY)  TROPONIN I (HIGH SENSITIVITY)    EKG EKG Interpretation  Date/Time:  Friday March 30 2020 14:21:14 EST Ventricular Rate:  91 PR Interval:    QRS Duration: 83 QT Interval:  378 QTC Calculation: 466 R  Axis:   13 Text Interpretation: Sinus rhythm Low voltage, precordial leads Abnormal R-wave progression, early transition No STEMI Confirmed by Alvester Chou [id number]) on 2020/03/31 3:21:18 PM   Radiology CT Angio Chest PE W and/or Wo Contrast  Result Date: 2020-03-31 CLINICAL DATA:  COVID positive. Hemoptysis, hypoxia. Rule out pulmonary embolism. EXAM: CT ANGIOGRAPHY CHEST WITH CONTRAST TECHNIQUE:  Multidetector CT imaging of the chest was performed using the standard protocol during bolus administration of intravenous contrast. Multiplanar CT image reconstructions and MIPs were obtained to evaluate the vascular anatomy. CONTRAST:  39mL OMNIPAQUE IOHEXOL 350 MG/ML SOLN COMPARISON:  Chest March 31, 2020.  CT chest 11/22/2018 FINDINGS: Cardiovascular: Negative for pulmonary embolism. Pulmonary arteries normal in caliber. Normal aortic arch. Heart size normal. No pericardial effusion Mediastinum/Nodes: Negative for mass or adenopathy Lungs/Pleura: Extensive bilateral airspace disease with patchy and confluent areas of airspace infiltrate more severe in the upper lobes than lower lobes. No effusion. Upper Abdomen: Negative Musculoskeletal: Negative Review of the MIP images confirms the above findings. IMPRESSION: Negative for pulmonary embolism Extensive bilateral airspace disease compatible with widespread COVID pneumonia. Electronically Signed   By: Marlan Palau M.D.   On: 03/31/20 15:24   DG Chest Port 1 View  Result Date: 2020/03/31 CLINICAL DATA:  Boxing EXAM: PORTABLE CHEST 1 VIEW COMPARISON:  August 16, 2019 FINDINGS: The cardiomediastinal silhouette is unchanged in contour. No pleural effusion. No pneumothorax. Multifocal bilateral peripheral predominant heterogeneous opacities, consistent with the sequela of COVID-19 infection. Status post cholecystectomy. No acute osseous abnormality. IMPRESSION: Multifocal bilateral peripheral predominant heterogeneous opacities, consistent with the sequela of COVID-19 infection. Electronically Signed   By: Meda Klinefelter MD   On: Mar 31, 2020 14:39    Procedures .Critical Care Performed by: Bill Salinas, PA-C Authorized by: Bill Salinas, PA-C   Critical care provider statement:    Critical care time (minutes):  50   Critical care was necessary to treat or prevent imminent or life-threatening deterioration of the following conditions:   Respiratory failure   Critical care was time spent personally by me on the following activities:  Discussions with consultants, evaluation of patient's response to treatment, examination of patient, ordering and performing treatments and interventions, ordering and review of laboratory studies, ordering and review of radiographic studies, pulse oximetry, re-evaluation of patient's condition, obtaining history from patient or surrogate, review of old charts and development of treatment plan with patient or surrogate     Medications Ordered in ED Medications  remdesivir 100 mg in sodium chloride 0.9 % 100 mL IVPB (has no administration in time range)  remdesivir 100 mg in sodium chloride 0.9 % 100 mL IVPB (has no administration in time range)  sodium chloride 0.9 % bolus 500 mL (500 mLs Intravenous New Bag/Given 2020/03/31 1542)  iohexol (OMNIPAQUE) 350 MG/ML injection 60 mL (60 mLs Intravenous Contrast Given Mar 31, 2020 1505)  dexamethasone (DECADRON) injection 10 mg (10 mg Intravenous Given 03/31/20 1531)    ED Course  I have reviewed the triage vital signs and the nursing notes.  Pertinent labs & imaging results that were available during my care of the patient were reviewed by me and considered in my medical decision making (see chart for details).  Clinical Course as of 03-31-2020 1623  Fri 31-Mar-2020  1520 45 yo female w/ hx of COPD presenting with cough, dyspnea, covid positive for 7 days, not vaccianted for this.  She is on 15 L NRB with 95% O2, speaking in full sentences, with dry persistent cough.   [  MT]    Clinical Course User Index [MT] Trifan, Kermit Balo, MD   MDM Rules/Calculators/A&P                         Additional history obtained from: 1. Nursing notes from this visit. 2. EMR review. ---------------------- I ordered, reviewed and interpreted labs which include: CBC shows leukopenia of 3.3, supportive of COVID-19 diagnosis, no anemia. CMP shows elevated creatinine concerning  for AKI, no emergent electrolyte derangement or gap, mild elevation of AST and ALT. Inflammatory markers CRP D-dimer ferritin LDH triglycerides elevated. Covid test positive, suspect etiology of patient's symptoms SNC troponin within normal limits, reassuring. Lactic elevated 2.4. Lipase within normal limits, doubt pancreatitis.  CXR:  IMPRESSION:  Multifocal bilateral peripheral predominant heterogeneous opacities,  consistent with the sequela of COVID-19 infection.   EKG: Sinus rhythm Low voltage, precordial leads Abnormal R-wave progression, early transition No STEMI Confirmed by Alvester Chou [phone]) on 04/26/20 3:21:18 PM  CT Angio PE Study:  IMPRESSION:  Negative for pulmonary embolism    Extensive bilateral airspace disease compatible with widespread  COVID pneumonia.  - Patient reassessed she remained stable on supplemental oxygen.  Remains alert and oriented.  She states understanding of findings above and is agreeable for admission.  She initially refused remdesivir but after I perform shared decision making with the patient she is agreeable to starting remdesivir as well as steroids.  Consult placed to hospitalist for admission - 4:15 PM: Consult with Dr. Randol Kern, patient accepted for admission.  Senaya Rhatigan was evaluated in Emergency Department on 04-26-2020 for the symptoms described in the history of present illness. She was evaluated in the context of the global COVID-19 pandemic, which necessitated consideration that the patient might be at risk for infection with the SARS-CoV-2 virus that causes COVID-19. Institutional protocols and algorithms that pertain to the evaluation of patients at risk for COVID-19 are in a state of rapid change based on information released by regulatory bodies including the CDC and federal and state organizations. These policies and algorithms were followed during the patient's care in the ED.  Note: Portions of this report may have been  transcribed using voice recognition software. Every effort was made to ensure accuracy; however, inadvertent computerized transcription errors may still be present. Final Clinical Impression(s) / ED Diagnoses Final diagnoses:  COVID-19 virus infection  Acute respiratory failure with hypoxia [facility])    Rx / DC Orders ED Discharge Orders    None       Elizabeth Palau April 26, 2020 1623    Terald Sleeper, MD 2020/04/26 [phone]

## 2020-03-31 DIAGNOSIS — J9601 Acute respiratory failure with hypoxia: Secondary | ICD-10-CM

## 2020-03-31 DIAGNOSIS — G43809 Other migraine, not intractable, without status migrainosus: Secondary | ICD-10-CM | POA: Diagnosis not present

## 2020-03-31 DIAGNOSIS — N179 Acute kidney failure, unspecified: Secondary | ICD-10-CM

## 2020-03-31 DIAGNOSIS — U071 COVID-19: Secondary | ICD-10-CM | POA: Diagnosis not present

## 2020-03-31 DIAGNOSIS — K219 Gastro-esophageal reflux disease without esophagitis: Secondary | ICD-10-CM | POA: Diagnosis not present

## 2020-03-31 DIAGNOSIS — G932 Benign intracranial hypertension: Secondary | ICD-10-CM

## 2020-03-31 LAB — CBC WITH DIFFERENTIAL/PLATELET
Abs Immature Granulocytes: 0.01 10*3/uL (ref 0.00–0.07)
Basophils Absolute: 0 10*3/uL (ref 0.0–0.1)
Basophils Relative: 0 %
Eosinophils Absolute: 0 10*3/uL (ref 0.0–0.5)
Eosinophils Relative: 0 %
HCT: 38.9 % (ref 36.0–46.0)
Hemoglobin: 12.1 g/dL (ref 12.0–15.0)
Immature Granulocytes: 1 %
Lymphocytes Relative: 27 %
Lymphs Abs: 0.4 10*3/uL — ABNORMAL LOW (ref 0.7–4.0)
MCH: 28.3 pg (ref 26.0–34.0)
MCHC: 31.1 g/dL (ref 30.0–36.0)
MCV: 90.9 fL (ref 80.0–100.0)
Monocytes Absolute: 0.1 10*3/uL (ref 0.1–1.0)
Monocytes Relative: 5 %
Neutro Abs: 1 10*3/uL — ABNORMAL LOW (ref 1.7–7.7)
Neutrophils Relative %: 67 %
Platelets: 92 10*3/uL — ABNORMAL LOW (ref 150–400)
RBC: 4.28 MIL/uL (ref 3.87–5.11)
RDW: 14.3 % (ref 11.5–15.5)
WBC: 1.5 10*3/uL — ABNORMAL LOW (ref 4.0–10.5)
nRBC: 0 % (ref 0.0–0.2)

## 2020-03-31 LAB — COMPREHENSIVE METABOLIC PANEL WITH GFR
ALT: 55 U/L — ABNORMAL HIGH (ref 0–44)
AST: 117 U/L — ABNORMAL HIGH (ref 15–41)
Albumin: 3.2 g/dL — ABNORMAL LOW (ref 3.5–5.0)
Alkaline Phosphatase: 69 U/L (ref 38–126)
Anion gap: 9 (ref 5–15)
BUN: 15 mg/dL (ref 6–20)
CO2: 26 mmol/L (ref 22–32)
Calcium: 8.7 mg/dL — ABNORMAL LOW (ref 8.9–10.3)
Chloride: 105 mmol/L (ref 98–111)
Creatinine, Ser: 1.19 mg/dL — ABNORMAL HIGH (ref 0.44–1.00)
GFR, Estimated: 57 mL/min — ABNORMAL LOW
Glucose, Bld: 252 mg/dL — ABNORMAL HIGH (ref 70–99)
Potassium: 4.2 mmol/L (ref 3.5–5.1)
Sodium: 139 mmol/L (ref 135–145)
Total Bilirubin: 0.6 mg/dL (ref 0.3–1.2)
Total Protein: 7 g/dL (ref 6.5–8.1)

## 2020-03-31 LAB — D-DIMER, QUANTITATIVE: D-Dimer, Quant: 1.18 ug{FEU}/mL — ABNORMAL HIGH (ref 0.00–0.50)

## 2020-03-31 LAB — MRSA PCR SCREENING: MRSA by PCR: NEGATIVE

## 2020-03-31 LAB — HIV ANTIBODY (ROUTINE TESTING W REFLEX): HIV Screen 4th Generation wRfx: NONREACTIVE

## 2020-03-31 LAB — GLUCOSE, CAPILLARY
Glucose-Capillary: 184 mg/dL — ABNORMAL HIGH (ref 70–99)
Glucose-Capillary: 199 mg/dL — ABNORMAL HIGH (ref 70–99)
Glucose-Capillary: 221 mg/dL — ABNORMAL HIGH (ref 70–99)

## 2020-03-31 MED ORDER — ALPRAZOLAM 0.5 MG PO TABS
0.5000 mg | ORAL_TABLET | Freq: Three times a day (TID) | ORAL | Status: DC | PRN
  Administered 2020-03-31 – 2020-04-13 (×24): 0.5 mg via ORAL
  Filled 2020-03-31 (×24): qty 1

## 2020-03-31 MED ORDER — SODIUM CHLORIDE 0.9 % IV SOLN
100.0000 mg | Freq: Every day | INTRAVENOUS | Status: AC
  Administered 2020-04-01 – 2020-04-04 (×4): 100 mg via INTRAVENOUS
  Filled 2020-03-31 (×4): qty 20

## 2020-03-31 MED ORDER — SODIUM CHLORIDE 0.9 % IV SOLN
100.0000 mg | INTRAVENOUS | Status: AC
  Administered 2020-03-31 (×2): 100 mg via INTRAVENOUS
  Filled 2020-03-31 (×2): qty 20

## 2020-03-31 MED ORDER — SACCHAROMYCES BOULARDII 250 MG PO CAPS
250.0000 mg | ORAL_CAPSULE | Freq: Two times a day (BID) | ORAL | Status: DC
  Administered 2020-03-31 – 2020-04-14 (×24): 250 mg via ORAL
  Filled 2020-03-31 (×26): qty 1

## 2020-03-31 MED ORDER — ENSURE ENLIVE PO LIQD
237.0000 mL | Freq: Two times a day (BID) | ORAL | Status: DC
  Administered 2020-03-31 – 2020-04-12 (×13): 237 mL via ORAL

## 2020-03-31 NOTE — Progress Notes (Signed)
PROGRESS NOTE    Beautiful Pensyl  BTD:[id number] DOB: [birth year] DOA: 04/03/2020 PCP: Glenda Chroman, MD   Chief Complaint  Patient presents with  . Shortness of Breath    Brief admission Narrative:  Sandra [name]  is a 45 y.o. female, nickel history of GERD,vertigo, depression, anxiety, pseudotumor cerebri, benign intracranial hypertension, with VP shunt, patient is unvaccinated against Covid, she presents with shortness of breath, cough, patient diagnosed with COVID-19 7 days ago with a home kit, she reports cough, shortness of breath, fatigue and body ache, but her symptoms continue to worsen, report dyspnea has worsened over the last 2 to 3 days, as well she reports chest pain related to deep breathing, and coughing, prompted her to come to ED. - in ED patient saturation 73% on room air, she is currently on 15 L high flow nasal cannula, CTA chest negative for PE, but significant for severe COVID-19  pneumonia, creatinine elevated at 1.5, AST elevated at 113, [facility] hospitalist consulted to admit.  Assessment & Plan: 1-acute respiratory failure with hypoxia secondary to COVID-19 pneumonia -Continue to follow inflammatory markers -Continue treatment with steroids, remdesivir and baricitinib. -Continue to wean oxygen supplementation as tolerated -Patient has been encourage to participate with proning position, incentive spirometer and flutter valve. -Continue as needed antitussive medications, vitamin C and zinc.  2-Morbid obesity (HCC) -Body mass index is 43.87 kg/m. -Low calorie diet, portion control increase physical activity discussed with patient.  3-depression/anxiety -Starting Xanax (alert social dysfunction and family sickness actively affecting patient) -Continue Lexapro. -No suicidal ideation hallucination currently.  4-GERD (gastroesophageal reflux disease) -Continue PPI.  5-Pseudotumor cerebri -Continue Diamox -Patient denies headaches currently.  6-acute kidney  injury -In the setting of prerenal azotemia, dehydration and transient hypotension -Hold HCTZ and Lasix -Minimize nephrotoxic medications -Continue to maintain adequate hydration -Follow renal function trend.  7-mild transaminitis -AST 113; most likely in the setting of acute COVID infection -Holding statins currently. -Follow LFTs.  8-history of migraines -Continue as needed Fioricet and rizatriptan.  9-history of COPD -Currently no wheezing -Continue home inhaler regimen. -Steroids treatment as mentioned above, assisting with condition stability.   DVT prophylaxis:Lovenox Code Status: Full code. Family Communication: Daughter was present at bedside. Disposition:   Status is: Inpatient  Dispo: The patient is from: Home              Anticipated d/c is to: Home              Anticipated d/c date is: To be determined              Patient currently no medically stable for discharge; still requiring significant amount of oxygen supplementation (15 L high flow nasal cannula along with nonrebreather), continue treatment with IV steroids, remdesivir and baricitinib.   Difficult to place patient: No       Consultants:   None   Procedures:  See below for x-ray reports.  Antimicrobials/antiviral Remdesivir 2 out of 5  Subjective: Reports ongoing nonproductive coughing spells, short winded sensation and shortness of breath; requiring 15 L high flow nasal cannula along with nonrebreather.  Patient having difficulty speaking in full sentences.  Objective: Vitals:   03/31/20 1000 03/31/20 1100 03/31/20 1200 03/31/20 1300  BP: 123/73 127/83 129/72 128/78  Pulse: 81 74 67 79  Resp: '17 13 15 18  ' Temp:    98.3 F (36.8 C)  TempSrc:    Oral  SpO2: 95% 95% 95% (!) 81%  Weight:      Height:  Intake/Output Summary (Last 24 hours) at 03/31/2020 1534 Last data filed at 03/31/2020 0600 Gross per 24 hour  Intake 700 ml  Output -  Net 700 ml   Filed Weights   04/09/2020  1337 03/31/20 0038  Weight: 122.5 kg 123.3 kg    Examination:  General exam: Mild respiratory distress with any exertion; positive tachypnea, intermittent coughing spells and having difficulty speaking in full sentences.  15 L nasal cannula supplementation along with nonrebreather.  Afebrile. Respiratory system: Positive rhonchi bilaterally; positive tachypnea appreciated; no wheezing or crackles. Cardiovascular system: S1 & S2 heard, RRR.  No rubs, no gallops, no murmurs.  Unable to assess JVD with body habitus. Gastrointestinal system: Abdomen is nondistended, soft and nontender. No organomegaly or masses felt. Normal bowel sounds heard. Central nervous system: No focal neurological deficits. Extremities: No cyanosis or clubbing. Skin: No petechiae. Psychiatry: Judgement and insight appear normal.  Anxious, flat affect.  Data Reviewed: I have personally reviewed following labs and imaging studies  CBC: Recent Labs  Lab 04/17/2020 1355 03/31/20 0227  WBC 3.3* 1.5*  NEUTROABS 2.5 1.0*  HGB 12.0 12.1  HCT 38.5 38.9  MCV 90.2 90.9  PLT 100* 92*    Basic Metabolic Panel: Recent Labs  Lab 04/04/2020 1355 03/31/20 0227  NA 133* 139  K 3.5 4.2  CL 102 105  CO2 21* 26  GLUCOSE 174* 252*  BUN 15 15  CREATININE 1.53* 1.19*  CALCIUM 8.0* 8.7*    GFR: Estimated Creatinine Clearance: 80 mL/min (A) (by C-G formula based on SCr of 1.19 mg/dL (H)).  Liver Function Tests: Recent Labs  Lab 04/03/2020 1355 03/31/20 0227  AST 113* 117*  ALT 56* 55*  ALKPHOS 68 69  BILITOT 0.6 0.6  PROT 6.7 7.0  ALBUMIN 3.4* 3.2*    CBG: Recent Labs  Lab 03/31/20 0817 03/31/20 1138  GLUCAP 184* 199*     Recent Results (from the past 240 hour(s))  SARS Coronavirus 2 by RT PCR (hospital order, performed in [facility] hospital lab) Nasopharyngeal Nasopharyngeal Swab     Status: Abnormal   Collection Time: 04/03/2020  1:55 PM   Specimen: Nasopharyngeal Swab  Result Value Ref Range Status    SARS Coronavirus 2 POSITIVE (A) NEGATIVE Final    Comment: RESULT CALLED TO, READ BACK BY AND VERIFIED WITH: MD ON  [id number] @ 1530 BY HENDERSON L. (NOTE) SARS-CoV-2 target nucleic acids are DETECTED  SARS-CoV-2 RNA is generally detectable in upper respiratory specimens  during the acute phase of infection.  Positive results are indicative  of the presence of the identified virus, but do not rule out bacterial infection or co-infection with other pathogens not detected by the test.  Clinical correlation with patient history and  other diagnostic information is necessary to determine patient infection status.  The expected result is negative.  Fact Sheet for Patients:   StrictlyIdeas.no   Fact Sheet for Healthcare Providers:   BankingDealers.co.za    This test is not yet approved or cleared by the [place] FDA and  has been authorized for detection and/or diagnosis of SARS-CoV-2 by FDA under an Emergency Use Authorization (EUA).  This EUA will remain in effect (meaning this  test can be used) for the duration of  the COVID-19 declaration under Section 564(b)(1) of the Act, 21 U.S.C. section 360-bbb-3(b)(1), unless the authorization is terminated or revoked sooner.  Performed at [facility], [address], [place], [place] [address]   Blood Culture (routine x 2)  Status: None (Preliminary result)   Collection Time: 04/11/2020  1:55 PM   Specimen: BLOOD  Result Value Ref Range Status   Specimen Description BLOOD BLOOD RIGHT HAND  Final   Special Requests   Final    Blood Culture adequate volume BOTTLES DRAWN AEROBIC AND ANAEROBIC   Culture   Final    NO GROWTH < 24 HOURS Performed at [facility], [address] [place], [place] [address]    Report Status PENDING  Incomplete  Blood Culture (routine x 2)     Status: None (Preliminary result)   Collection Time: 03/28/2020  2:00 PM   Specimen: BLOOD  Result Value Ref Range Status    Specimen Description BLOOD RIGHT ANTECUBITAL  Final   Special Requests   Final    Blood Culture adequate volume BOTTLES DRAWN AEROBIC AND ANAEROBIC   Culture   Final    NO GROWTH < 24 HOURS Performed at [facility], [address], [place], [place] [address]    Report Status PENDING  Incomplete  MRSA PCR Screening     Status: None   Collection Time: 03/31/20 12:36 AM   Specimen: Nasal Mucosa; Nasopharyngeal  Result Value Ref Range Status   MRSA by PCR NEGATIVE NEGATIVE Final    Comment:        The GeneXpert MRSA Assay (FDA approved for NASAL specimens only), is one component of a comprehensive MRSA colonization surveillance program. It is not intended to diagnose MRSA infection nor to guide or monitor treatment for MRSA infections. Performed at [facility], [address], [place],  [address]     Radiology Studies: CT Angio Chest PE W and/or Wo Contrast  Result Date: 03/27/2020 CLINICAL DATA:  COVID positive. Hemoptysis, hypoxia. Rule out pulmonary embolism. EXAM: CT ANGIOGRAPHY CHEST WITH CONTRAST TECHNIQUE: Multidetector CT imaging of the chest was performed using the standard protocol during bolus administration of intravenous contrast. Multiplanar CT image reconstructions and MIPs were obtained to evaluate the vascular anatomy. CONTRAST:  28m OMNIPAQUE IOHEXOL 350 MG/ML SOLN COMPARISON:  Chest 03/29/2020.  CT chest 11/22/2018 FINDINGS: Cardiovascular: Negative for pulmonary embolism. Pulmonary arteries normal in caliber. Normal aortic arch. Heart size normal. No pericardial effusion Mediastinum/Nodes: Negative for mass or adenopathy Lungs/Pleura: Extensive bilateral airspace disease with patchy and confluent areas of airspace infiltrate more severe in the upper lobes than lower lobes. No effusion. Upper Abdomen: Negative Musculoskeletal: Negative Review of the MIP images confirms the above findings. IMPRESSION: Negative for pulmonary embolism Extensive bilateral airspace  disease compatible with widespread COVID pneumonia. Electronically Signed   By: CFranchot GalloM.D.   On: 03/27/2020 15:24   DG Chest Port 1 View  Result Date: 04/17/2020 CLINICAL DATA:  Boxing EXAM: PORTABLE CHEST 1 VIEW COMPARISON:  August 16, 2019 FINDINGS: The cardiomediastinal silhouette is unchanged in contour. No pleural effusion. No pneumothorax. Multifocal bilateral peripheral predominant heterogeneous opacities, consistent with the sequela of COVID-19 infection. Status post cholecystectomy. No acute osseous abnormality. IMPRESSION: Multifocal bilateral peripheral predominant heterogeneous opacities, consistent with the sequela of COVID-19 infection. Electronically Signed   By: SValentino SaxonMD   On: 03/31/2020 14:39   Scheduled Meds: . acetaZOLAMIDE  250 mg Oral BID  . acyclovir  200 mg Oral Daily  . vitamin C  500 mg Oral Daily  . azelastine  2 spray Each Nare Daily  . baricitinib  2 mg Oral Daily  . cholecalciferol  25 mcg Oral Daily  . enoxaparin (LOVENOX) injection  60 mg Subcutaneous Q24H  .  feeding supplement  237 mL Oral BID BM  . FLUoxetine  40 mg Oral Daily  . fluticasone furoate-vilanterol  1 puff Inhalation Daily  . loratadine  10 mg Oral Daily  . methylPREDNISolone (SOLU-MEDROL) injection  1 mg/kg Intravenous Q12H   Followed by  . [START ON 04/02/2020] predniSONE  50 mg Oral Daily  . pantoprazole  40 mg Oral BID  . pregabalin  150 mg Oral QHS  . saccharomyces boulardii  250 mg Oral BID  . zinc sulfate  220 mg Oral Daily   Continuous Infusions: . [START ON 04/01/2020] remdesivir 100 mg in NS 100 mL       LOS: 1 day    Time spent: 35 minutes    Barton Dubois, MD [facility]   To contact the attending provider between 7A-7P or the covering provider during after hours 7P-7A, please log into the web site [email or web address] and access using universal [place] password for that web site. If you do not have the password, please call the hospital  operator.  03/31/2020, 3:34 PM

## 2020-03-31 NOTE — Progress Notes (Signed)
Initial Nutrition Assessment  DOCUMENTATION CODES:   Morbid obesity  INTERVENTION:  Continue Ensure Enlive po BID, each supplement provides 350 kcal and 20 grams of protein  ProSource Plus 30 ml po TID, each supplement provides 100 kcal and 15 grams of protein   NUTRITION DIAGNOSIS:   Increased nutrient needs related to catabolic illness (XYBFX-83 infection) as evidenced by estimated needs.    GOAL:   Patient will meet greater than or equal to 90% of their needs    MONITOR:   Labs,I & O's,Skin,Supplement acceptance,PO intake,Weight trends  REASON FOR ASSESSMENT:   Malnutrition Screening Tool    ASSESSMENT:  45 year old female admitted with acute respiratory failure with hypoxia secondary to COVID-19 pneumonia. Past medical history of GERD, vertigo, depression, anxiety, pseudomotor cerebri, benign intracranial hypertension with VP shunt presented with worsening SOB, cough, fatigue, and body aches since testing positive for Covid 7 days ago with home kit.  RD working remotely.  Pt with ongoing high oxygen requirements, 15 L HF with NRB  Unable to obtain nutrition history at this time. No documented intakes for review, suspect meal intake is poor. She accepting 1 of 2 Ensure supplements offered today. Will continue Ensure and add ProSource Plus to help her meet her needs.   Per chart, weights have decreased 17 lbs (5.9%) over the last 7 months; insignificant  Medications reviewed and include: Acyclovir, Vit C, D3, Prozac, Methylprednisolone, Protonix, Lyrica, Florastor, Zinc sulfate, Remdesivir  Labs: CBGs [phone]  NUTRITION - FOCUSED PHYSICAL EXAM:  Unable to complete at this time  Diet Order:   Diet Order            Diet Heart Room service appropriate? Yes; Fluid consistency: Thin  Diet effective now                 EDUCATION NEEDS:   Not appropriate for education at this time  Skin:  Skin Assessment: Reviewed RN Assessment  Last BM:  2/5 - type  6;type 7  Height:   Ht Readings from Last 1 Encounters:  03/31/20 '5\' 6"'  (1.676 m)    Weight:   Wt Readings from Last 1 Encounters:  03/31/20 123.3 kg    BMI:  Body mass index is 43.87 kg/m.  Estimated Nutritional Needs:   Kcal:  0045-9977  Protein:  140-154  Fluid:  >2.2 L    Lajuan Lines, RD, LDN Clinical Nutrition After Hours/Weekend Pager # in [phone]

## 2020-04-01 DIAGNOSIS — G43809 Other migraine, not intractable, without status migrainosus: Secondary | ICD-10-CM | POA: Diagnosis not present

## 2020-04-01 DIAGNOSIS — U071 COVID-19: Secondary | ICD-10-CM | POA: Diagnosis not present

## 2020-04-01 DIAGNOSIS — K219 Gastro-esophageal reflux disease without esophagitis: Secondary | ICD-10-CM | POA: Diagnosis not present

## 2020-04-01 DIAGNOSIS — J9601 Acute respiratory failure with hypoxia: Secondary | ICD-10-CM | POA: Diagnosis not present

## 2020-04-01 LAB — CBC WITH DIFFERENTIAL/PLATELET
Abs Immature Granulocytes: 0.02 10*3/uL (ref 0.00–0.07)
Basophils Absolute: 0 10*3/uL (ref 0.0–0.1)
Basophils Relative: 0 %
Eosinophils Absolute: 0 10*3/uL (ref 0.0–0.5)
Eosinophils Relative: 0 %
HCT: 40.5 % (ref 36.0–46.0)
Hemoglobin: 12.1 g/dL (ref 12.0–15.0)
Immature Granulocytes: 1 %
Lymphocytes Relative: 18 %
Lymphs Abs: 0.4 10*3/uL — ABNORMAL LOW (ref 0.7–4.0)
MCH: 27.6 pg (ref 26.0–34.0)
MCHC: 29.9 g/dL — ABNORMAL LOW (ref 30.0–36.0)
MCV: 92.5 fL (ref 80.0–100.0)
Monocytes Absolute: 0.1 10*3/uL (ref 0.1–1.0)
Monocytes Relative: 6 %
Neutro Abs: 1.7 10*3/uL (ref 1.7–7.7)
Neutrophils Relative %: 75 %
Platelets: 103 10*3/uL — ABNORMAL LOW (ref 150–400)
RBC: 4.38 MIL/uL (ref 3.87–5.11)
RDW: 14.3 % (ref 11.5–15.5)
WBC: 2.2 10*3/uL — ABNORMAL LOW (ref 4.0–10.5)
nRBC: 0 % (ref 0.0–0.2)

## 2020-04-01 LAB — COMPREHENSIVE METABOLIC PANEL
ALT: 52 U/L — ABNORMAL HIGH (ref 0–44)
AST: 111 U/L — ABNORMAL HIGH (ref 15–41)
Albumin: 3 g/dL — ABNORMAL LOW (ref 3.5–5.0)
Alkaline Phosphatase: 72 U/L (ref 38–126)
Anion gap: 9 (ref 5–15)
BUN: 23 mg/dL — ABNORMAL HIGH (ref 6–20)
CO2: 24 mmol/L (ref 22–32)
Calcium: 8.9 mg/dL (ref 8.9–10.3)
Chloride: 107 mmol/L (ref 98–111)
Creatinine, Ser: 1.34 mg/dL — ABNORMAL HIGH (ref 0.44–1.00)
GFR, Estimated: 50 mL/min — ABNORMAL LOW (ref >60)
Glucose, Bld: 236 mg/dL — ABNORMAL HIGH (ref 70–99)
Potassium: 3.9 mmol/L (ref 3.5–5.1)
Sodium: 140 mmol/L (ref 135–145)
Total Bilirubin: 0.6 mg/dL (ref 0.3–1.2)
Total Protein: 6.6 g/dL (ref 6.5–8.1)

## 2020-04-01 LAB — C-REACTIVE PROTEIN: CRP: 2.4 mg/dL — ABNORMAL HIGH (ref <1.0)

## 2020-04-01 LAB — D-DIMER, QUANTITATIVE: D-Dimer, Quant: 1.14 ug/mL-FEU — ABNORMAL HIGH (ref 0.00–0.50)

## 2020-04-01 MED ORDER — BARICITINIB 2 MG PO TABS
4.0000 mg | ORAL_TABLET | Freq: Every day | ORAL | Status: AC
  Administered 2020-04-01 – 2020-04-10 (×10): 4 mg via ORAL
  Filled 2020-04-01 (×11): qty 2

## 2020-04-01 MED ORDER — NYSTATIN 100000 UNIT/ML MT SUSP
5.0000 mL | Freq: Four times a day (QID) | OROMUCOSAL | Status: DC
  Administered 2020-04-01 – 2020-04-18 (×57): 500000 [IU] via ORAL
  Filled 2020-04-01 (×55): qty 5

## 2020-04-01 NOTE — Progress Notes (Signed)
PROGRESS NOTE    Ardra Kuznicki  RJJ:[id number] DOB: 08-11-1975 DOA: 04/15/2020 PCP: Glenda Chroman, MD   Chief Complaint  Patient presents with  . Shortness of Breath    Brief admission Narrative:  Sandra [name]  is a 45 y.o. female, nickel history of GERD,vertigo, depression, anxiety, pseudotumor cerebri, benign intracranial hypertension, with VP shunt, patient is unvaccinated against Covid, she presents with shortness of breath, cough, patient diagnosed with COVID-19 7 days ago with a home kit, she reports cough, shortness of breath, fatigue and body ache, but her symptoms continue to worsen, report dyspnea has worsened over the last 2 to 3 days, as well she reports chest pain related to deep breathing, and coughing, prompted her to come to ED. - in ED patient saturation 73% on room air, she is currently on 15 L high flow nasal cannula, CTA chest negative for PE, but significant for severe COVID-19  pneumonia, creatinine elevated at 1.5, AST elevated at 113, [facility] hospitalist consulted to admit.  Assessment & Plan: 1-acute respiratory failure with hypoxia secondary to COVID-19 pneumonia -Continue to follow inflammatory markers -Continue treatment with steroids, remdesivir and baricitinib. -Continue to wean oxygen supplementation as tolerated -Patient has been encourage to participate with proning position, incentive spirometer and flutter valve. -Continue as needed antitussive medications, vitamin C and zinc. -Will attempt BiPAP at bedtime.  2-Morbid obesity (HCC) -Body mass index is 45.51 kg/m. -Low calorie diet, portion control increase physical activity discussed with patient. -With concerns for obesity hypoventilation syndrome, sleep apnea; patient significant other reporting episodes of apnea in the middle of the night and snoring prior to admission or being sick.  3-depression/anxiety -Starting Xanax (alert social dysfunction and family sickness actively affecting  patient) -Continue Lexapro. -No suicidal ideation hallucination currently.  4-GERD (gastroesophageal reflux disease) -Continue PPI.  5-Pseudotumor cerebri -Continue Diamox -Patient denies headaches currently.  6-acute kidney injury -In the setting of prerenal azotemia, dehydration and transient hypotension -Hold HCTZ and Lasix -Minimize nephrotoxic medications -Continue to maintain adequate hydration -Follow renal function trend.  7-mild transaminitis -AST 113; most likely in the setting of acute COVID infection -Holding statins currently. -Follow LFTs.  8-history of migraines -Continue as needed Fioricet and rizatriptan.  9-history of COPD -Currently no wheezing -Continue home inhaler regimen. -Steroids treatment as mentioned above, assisting with condition stability.   DVT prophylaxis:Lovenox Code Status: Full code. Family Communication: Patient's boyfriend/significant other at bedside. Disposition:   Status is: Inpatient  Dispo: The patient is from: Home              Anticipated d/c is to: Home              Anticipated d/c date is: To be determined              Patient currently no medically stable for discharge; still requiring significant amount of oxygen supplementation (15 L high flow nasal cannula along with nonrebreather), continue treatment with IV steroids, remdesivir and baricitinib.   Difficult to place patient: No       Consultants:   None   Procedures:  See below for x-ray reports.  Antimicrobials/antiviral Remdesivir 3 out of 5  Subjective: Continue to have intermittent coughing spells; short winded sensation with minimal activity.  Requiring 15 L high flow nasal cannula supplementation along with nonrebreather mask.  Minimal activity demonstrate significant desaturation despite supplementation.  Patient received 100% FiO2.  Objective: Vitals:   04/01/20 0500 04/01/20 0503 04/01/20 0809 04/01/20 0900  BP: 122/66   124/72  Pulse: 73    89  Resp: 20   14  Temp:  98.4 F (36.9 C) 99.1 F (37.3 C)   TempSrc:  Axillary Axillary   SpO2: 90%   96%  Weight:  127.9 kg    Height:        Intake/Output Summary (Last 24 hours) at 04/01/2020 1606 Last data filed at 04/01/2020 0900 Gross per 24 hour  Intake 360 ml  Output -  Net 360 ml   Filed Weights   04/13/2020 1337 03/31/20 0038 04/01/20 0503  Weight: 122.5 kg 123.3 kg 127.9 kg    Examination: General exam: Alert, awake, oriented x 3; no chest pain, no nausea, no vomiting.  Still complaining of coughing spells and with it, some chest discomfort; significant desaturation despite supplementation with any type of activity.  No fever.  Patient is requiring nonrebreather along with 15 L high flow nasal cannula.  100% FiO2. Respiratory system: Demonstrating difficulty speaking in full sentences; positive rhonchi bilaterally; no wheezing appreciated. Cardiovascular system:RRR. No murmurs, rubs, gallops.  Unable to properly assess JVD with body habitus. Gastrointestinal system: Abdomen is obese, nondistended, soft and nontender. No organomegaly or masses felt. Normal bowel sounds heard. Central nervous system: Alert and oriented. No focal neurological deficits. Extremities: No cyanosis or clubbing. Skin: No rashes, no petechiae. Psychiatry: Judgement and insight appear normal.  Anxious mood, flat affect.  Data Reviewed: I have personally reviewed following labs and imaging studies  CBC: Recent Labs  Lab 03/27/2020 1355 03/31/20 0227 04/01/20 0217  WBC 3.3* 1.5* 2.2*  NEUTROABS 2.5 1.0* 1.7  HGB 12.0 12.1 12.1  HCT 38.5 38.9 40.5  MCV 90.2 90.9 92.5  PLT 100* 92* 103*    Basic Metabolic Panel: Recent Labs  Lab 04/23/2020 1355 03/31/20 0227 04/01/20 0217  NA 133* 139 140  K 3.5 4.2 3.9  CL 102 105 107  CO2 21* 26 24  GLUCOSE 174* 252* 236*  BUN 15 15 23*  CREATININE 1.53* 1.19* 1.34*  CALCIUM 8.0* 8.7* 8.9    GFR: Estimated Creatinine Clearance: 72.6 mL/min (A)  (by C-G formula based on SCr of 1.34 mg/dL (H)).  Liver Function Tests: Recent Labs  Lab 04/13/2020 1355 03/31/20 0227 04/01/20 0217  AST 113* 117* 111*  ALT 56* 55* 52*  ALKPHOS 68 69 72  BILITOT 0.6 0.6 0.6  PROT 6.7 7.0 6.6  ALBUMIN 3.4* 3.2* 3.0*    CBG: Recent Labs  Lab 03/31/20 0817 03/31/20 1138 03/31/20 1642  GLUCAP 184* 199* 221*     Recent Results (from the past 240 hour(s))  SARS Coronavirus 2 by RT PCR (hospital order, performed in [place] hospital lab) Nasopharyngeal Nasopharyngeal Swab     Status: Abnormal   Collection Time: 04/07/2020  1:55 PM   Specimen: Nasopharyngeal Swab  Result Value Ref Range Status   SARS Coronavirus 2 POSITIVE (A) NEGATIVE Final    Comment: RESULT CALLED TO, READ BACK BY AND VERIFIED WITH: MD ON  [id number] @ 1530 BY HENDERSON L. (NOTE) SARS-CoV-2 target nucleic acids are DETECTED  SARS-CoV-2 RNA is generally detectable in upper respiratory specimens  during the acute phase of infection.  Positive results are indicative  of the presence of the identified virus, but do not rule out bacterial infection or co-infection with other pathogens not detected by the test.  Clinical correlation with patient history and  other diagnostic information is necessary to determine patient infection status.  The expected result is negative.  Fact Sheet for Patients:  StrictlyIdeas.no   Fact Sheet for Healthcare Providers:   BankingDealers.co.za    This test is not yet approved or cleared by the [place] FDA and  has been authorized for detection and/or diagnosis of SARS-CoV-2 by FDA under an Emergency Use Authorization (EUA).  This EUA will remain in effect (meaning this  test can be used) for the duration of  the COVID-19 declaration under Section 564(b)(1) of the Act, 21 U.S.C. section 360-bbb-3(b)(1), unless the authorization is terminated or revoked sooner.  Performed at [facility], [address] [place], [place] [address]   Blood Culture (routine x 2)     Status: None (Preliminary result)   Collection Time: 04/05/2020  1:55 PM   Specimen: BLOOD  Result Value Ref Range Status   Specimen Description BLOOD BLOOD RIGHT HAND  Final   Special Requests   Final    Blood Culture adequate volume BOTTLES DRAWN AEROBIC AND ANAEROBIC   Culture   Final    NO GROWTH 2 DAYS Performed at [facility], [address], [place], [place] [address]    Report Status PENDING  Incomplete  Blood Culture (routine x 2)     Status: None (Preliminary result)   Collection Time: 04/09/2020  2:00 PM   Specimen: BLOOD  Result Value Ref Range Status   Specimen Description BLOOD RIGHT ANTECUBITAL  Final   Special Requests   Final    Blood Culture adequate volume BOTTLES DRAWN AEROBIC AND ANAEROBIC   Culture   Final    NO GROWTH 2 DAYS Performed at [facility], [address] [place], [place] [address]    Report Status PENDING  Incomplete  MRSA PCR Screening     Status: None   Collection Time: 03/31/20 12:36 AM   Specimen: Nasal Mucosa; Nasopharyngeal  Result Value Ref Range Status   MRSA by PCR NEGATIVE NEGATIVE Final    Comment:        The GeneXpert MRSA Assay (FDA approved for NASAL specimens only), is one component of a comprehensive MRSA colonization surveillance program. It is not intended to diagnose MRSA infection nor to guide or monitor treatment for MRSA infections. Performed at [facility], [address], [place], [place] [address]     Radiology Studies: No results found. Scheduled Meds: . acetaZOLAMIDE  250 mg Oral BID  . acyclovir  200 mg Oral Daily  . vitamin C  500 mg Oral Daily  . azelastine  2 spray Each Nare Daily  . baricitinib  4 mg Oral Daily  . cholecalciferol  25 mcg Oral Daily  . enoxaparin (LOVENOX) injection  60 mg Subcutaneous Q24H  . feeding supplement  237 mL Oral BID BM  . FLUoxetine  40 mg Oral Daily  . fluticasone furoate-vilanterol  1 puff  Inhalation Daily  . loratadine  10 mg Oral Daily  . methylPREDNISolone (SOLU-MEDROL) injection  1 mg/kg Intravenous Q12H   Followed by  . [START ON 04/02/2020] predniSONE  50 mg Oral Daily  . pantoprazole  40 mg Oral BID  . pregabalin  150 mg Oral QHS  . saccharomyces boulardii  250 mg Oral BID  . zinc sulfate  220 mg Oral Daily   Continuous Infusions: . remdesivir 100 mg in NS 100 mL 100 mg (04/01/20 1051)     LOS: 2 days    Time spent: 35 minutes    Barton Dubois, MD [facility]   To contact the attending provider between 7A-7P or the covering provider during after hours  7P-7A, please log into the web site [email or web address] and access using universal [place] password for that web site. If you do not have the password, please call the hospital operator.  04/01/2020, 4:06 PM

## 2020-04-02 DIAGNOSIS — G43809 Other migraine, not intractable, without status migrainosus: Secondary | ICD-10-CM | POA: Diagnosis not present

## 2020-04-02 DIAGNOSIS — J9601 Acute respiratory failure with hypoxia: Secondary | ICD-10-CM | POA: Diagnosis not present

## 2020-04-02 DIAGNOSIS — U071 COVID-19: Secondary | ICD-10-CM | POA: Diagnosis not present

## 2020-04-02 DIAGNOSIS — K219 Gastro-esophageal reflux disease without esophagitis: Secondary | ICD-10-CM | POA: Diagnosis not present

## 2020-04-02 LAB — COMPREHENSIVE METABOLIC PANEL
ALT: 42 U/L (ref 0–44)
AST: 68 U/L — ABNORMAL HIGH (ref 15–41)
Albumin: 3 g/dL — ABNORMAL LOW (ref 3.5–5.0)
Alkaline Phosphatase: 70 U/L (ref 38–126)
Anion gap: 9 (ref 5–15)
BUN: 27 mg/dL — ABNORMAL HIGH (ref 6–20)
CO2: 21 mmol/L — ABNORMAL LOW (ref 22–32)
Calcium: 8.6 mg/dL — ABNORMAL LOW (ref 8.9–10.3)
Chloride: 107 mmol/L (ref 98–111)
Creatinine, Ser: 1.06 mg/dL — ABNORMAL HIGH (ref 0.44–1.00)
GFR, Estimated: >60 mL/min (ref >60)
Glucose, Bld: 260 mg/dL — ABNORMAL HIGH (ref 70–99)
Potassium: 3.4 mmol/L — ABNORMAL LOW (ref 3.5–5.1)
Sodium: 137 mmol/L (ref 135–145)
Total Bilirubin: 0.5 mg/dL (ref 0.3–1.2)
Total Protein: 6.4 g/dL — ABNORMAL LOW (ref 6.5–8.1)

## 2020-04-02 LAB — CBC WITH DIFFERENTIAL/PLATELET
Abs Immature Granulocytes: 0.03 10*3/uL (ref 0.00–0.07)
Basophils Absolute: 0 10*3/uL (ref 0.0–0.1)
Basophils Relative: 0 %
Eosinophils Absolute: 0 10*3/uL (ref 0.0–0.5)
Eosinophils Relative: 0 %
HCT: 38.9 % (ref 36.0–46.0)
Hemoglobin: 11.8 g/dL — ABNORMAL LOW (ref 12.0–15.0)
Immature Granulocytes: 1 %
Lymphocytes Relative: 15 %
Lymphs Abs: 0.5 10*3/uL — ABNORMAL LOW (ref 0.7–4.0)
MCH: 27.6 pg (ref 26.0–34.0)
MCHC: 30.3 g/dL (ref 30.0–36.0)
MCV: 90.9 fL (ref 80.0–100.0)
Monocytes Absolute: 0.2 10*3/uL (ref 0.1–1.0)
Monocytes Relative: 5 %
Neutro Abs: 2.6 10*3/uL (ref 1.7–7.7)
Neutrophils Relative %: 79 %
Platelets: 123 10*3/uL — ABNORMAL LOW (ref 150–400)
RBC: 4.28 MIL/uL (ref 3.87–5.11)
RDW: 14.2 % (ref 11.5–15.5)
WBC: 3.2 10*3/uL — ABNORMAL LOW (ref 4.0–10.5)
nRBC: 0 % (ref 0.0–0.2)

## 2020-04-02 LAB — GLUCOSE, CAPILLARY
Glucose-Capillary: 272 mg/dL — ABNORMAL HIGH (ref 70–99)
Glucose-Capillary: 382 mg/dL — ABNORMAL HIGH (ref 70–99)
Glucose-Capillary: 348 mg/dL — ABNORMAL HIGH (ref 70–99)
Glucose-Capillary: 357 mg/dL — ABNORMAL HIGH (ref 70–99)

## 2020-04-02 LAB — D-DIMER, QUANTITATIVE: D-Dimer, Quant: 0.65 ug/mL-FEU — ABNORMAL HIGH (ref 0.00–0.50)

## 2020-04-02 LAB — C-REACTIVE PROTEIN: CRP: 2.3 mg/dL — ABNORMAL HIGH (ref <1.0)

## 2020-04-02 MED ORDER — METHYLPREDNISOLONE SODIUM SUCC 125 MG IJ SOLR
80.0000 mg | Freq: Three times a day (TID) | INTRAMUSCULAR | Status: DC
  Administered 2020-04-02 – 2020-04-05 (×8): 80 mg via INTRAVENOUS
  Filled 2020-04-02 (×8): qty 2

## 2020-04-02 MED ORDER — INSULIN ASPART 100 UNIT/ML ~~LOC~~ SOLN
0.0000 [IU] | Freq: Every day | SUBCUTANEOUS | Status: DC
  Administered 2020-04-02: 5 [IU] via SUBCUTANEOUS
  Administered 2020-04-03 – 2020-04-07 (×3): 2 [IU] via SUBCUTANEOUS
  Administered 2020-04-14: 5 [IU] via SUBCUTANEOUS

## 2020-04-02 MED ORDER — DIPHENHYDRAMINE HCL 50 MG/ML IJ SOLN
25.0000 mg | Freq: Three times a day (TID) | INTRAMUSCULAR | Status: DC | PRN
  Administered 2020-04-03 – 2020-04-12 (×5): 25 mg via INTRAVENOUS
  Filled 2020-04-02 (×6): qty 1

## 2020-04-02 MED ORDER — INSULIN DETEMIR 100 UNIT/ML ~~LOC~~ SOLN
8.0000 [IU] | Freq: Every day | SUBCUTANEOUS | Status: DC
  Administered 2020-04-02 – 2020-04-03 (×2): 8 [IU] via SUBCUTANEOUS
  Filled 2020-04-02 (×3): qty 0.08

## 2020-04-02 MED ORDER — POTASSIUM CHLORIDE CRYS ER 20 MEQ PO TBCR
40.0000 meq | EXTENDED_RELEASE_TABLET | Freq: Once | ORAL | Status: AC
  Administered 2020-04-02: 40 meq via ORAL
  Filled 2020-04-02: qty 2

## 2020-04-02 MED ORDER — INSULIN ASPART 100 UNIT/ML ~~LOC~~ SOLN
0.0000 [IU] | Freq: Three times a day (TID) | SUBCUTANEOUS | Status: DC
  Administered 2020-04-03 (×2): 8 [IU] via SUBCUTANEOUS
  Administered 2020-04-03 – 2020-04-04 (×4): 5 [IU] via SUBCUTANEOUS
  Administered 2020-04-05: 8 [IU] via SUBCUTANEOUS
  Administered 2020-04-05: 3 [IU] via SUBCUTANEOUS
  Administered 2020-04-05: 8 [IU] via SUBCUTANEOUS
  Administered 2020-04-06: 5 [IU] via SUBCUTANEOUS
  Administered 2020-04-06: 11 [IU] via SUBCUTANEOUS
  Administered 2020-04-07: 3 [IU] via SUBCUTANEOUS
  Administered 2020-04-07 – 2020-04-08 (×3): 5 [IU] via SUBCUTANEOUS
  Administered 2020-04-09 (×3): 3 [IU] via SUBCUTANEOUS
  Administered 2020-04-10: 2 [IU] via SUBCUTANEOUS
  Administered 2020-04-13: 5 [IU] via SUBCUTANEOUS

## 2020-04-02 MED ORDER — MORPHINE SULFATE (PF) 4 MG/ML IV SOLN
4.0000 mg | Freq: Once | INTRAVENOUS | Status: AC
  Administered 2020-04-02: 4 mg via INTRAVENOUS
  Filled 2020-04-02: qty 1

## 2020-04-02 MED ORDER — ACETAMINOPHEN 325 MG PO TABS
650.0000 mg | ORAL_TABLET | Freq: Four times a day (QID) | ORAL | Status: DC | PRN
  Administered 2020-04-03 – 2020-04-16 (×4): 650 mg via ORAL
  Filled 2020-04-02 (×4): qty 2

## 2020-04-02 NOTE — Progress Notes (Signed)
PROGRESS NOTE    Sandra [name]  EEF:[id number] DOB: [birth year] DOA: 04/21/2020 PCP: Glenda Chroman, MD   Chief Complaint  Patient presents with  . Shortness of Breath    Brief admission Narrative:  Sandra [name]  is a 45 y.o. female, nickel history of GERD,vertigo, depression, anxiety, pseudotumor cerebri, benign intracranial hypertension, with VP shunt, patient is unvaccinated against Covid, she presents with shortness of breath, cough, patient diagnosed with COVID-19 7 days ago with a home kit, she reports cough, shortness of breath, fatigue and body ache, but her symptoms continue to worsen, report dyspnea has worsened over the last 2 to 3 days, as well she reports chest pain related to deep breathing, and coughing, prompted her to come to ED. - in ED patient saturation 73% on room air, she is currently on 15 L high flow nasal cannula, CTA chest negative for PE, but significant for severe COVID-19  pneumonia, creatinine elevated at 1.5, AST elevated at 113, [facility] hospitalist consulted to admit.  Assessment & Plan: 1-acute respiratory failure with hypoxia secondary to COVID-19 pneumonia -Continue to follow inflammatory markers -Continue treatment with steroids, remdesivir and baricitinib. -Continue to wean oxygen supplementation as tolerated -Patient has been encourage to participate with proning position, incentive spirometer and flutter valve. -Continue as needed antitussive medications, vitamin C and zinc. -Will continue BiPAP at bedtime.  2-Morbid obesity (HCC) -Body mass index is 44.16 kg/m. -Low calorie diet, portion control increase physical activity discussed with patient. -With concerns for obesity hypoventilation syndrome, sleep apnea; patient significant other reporting episodes of apnea in the middle of the night and snoring prior to admission or being sick.  3-depression/anxiety -Starting Xanax (alert social dysfunction and family sickness actively affecting  patient) -Continue Lexapro. -No suicidal ideation hallucination currently.  4-GERD (gastroesophageal reflux disease) -Continue PPI.  5-Pseudotumor cerebri -Continue Diamox -Patient denies headaches currently.  6-acute kidney injury -In the setting of prerenal azotemia, dehydration and transient hypotension -Hold HCTZ and Lasix -Minimize nephrotoxic medications -Continue to maintain adequate hydration -Follow renal function trend.  7-mild transaminitis -AST 113; most likely in the setting of acute COVID infection -Holding statins currently. -Follow LFTs.  8-history of migraines -Continue as needed Fioricet and rizatriptan.  9-history of COPD -Currently no wheezing -Continue home inhaler regimen. -Steroids treatment as mentioned above, assisting with condition stability.   DVT prophylaxis:Lovenox Code Status: Full code. Family Communication: Patient's boyfriend/significant other at bedside. Disposition:   Status is: Inpatient  Dispo: The patient is from: Home              Anticipated d/c is to: Home              Anticipated d/c date is: To be determined              Patient currently no medically stable for discharge; still requiring significant amount of oxygen supplementation (15 L high flow nasal cannula along with nonrebreather), continue treatment with IV steroids, remdesivir and baricitinib.   Difficult to place patient: No       Consultants:   None   Procedures:  See below for x-ray reports.  Antimicrobials/antiviral Remdesivir 3 out of 5  Subjective: No fever, no nausea, no vomiting.  Still requiring increased amount of oxygen supplementation; good response with the use of BiPAP at nighttime.  Intermittent coughing spells, short winded sensation and desaturation with activity has been reported.  Objective: Vitals:   04/02/20 1130 04/02/20 1200 04/02/20 1556 04/02/20 1626  BP: (!) 109/45 (!) 117/49 Marland Kitchen)  121/58   Pulse: 70 73 69 70  Resp: '15 16  18 20  ' Temp: 99.1 F (37.3 C)   98.9 F (37.2 C)  TempSrc: Axillary   Oral  SpO2: 96% 95% 96% 96%  Weight:      Height:       No intake or output data in the 24 hours ending 04/02/20 1822 Filed Weights   04/09/2020 1337 03/31/20 0038 04/01/20 0503  Weight: 122.5 kg 123.3 kg 127.9 kg    Examination: General exam: Afebrile, no nausea, no vomiting.  Resting comfortably on BiPAP.   Respiratory system: Positive rhonchi bilaterally; no wheezing, no crackles, no accessory muscles appreciated. Cardiovascular system:RRR. No murmurs, rubs, gallops.  No JVD. Gastrointestinal system: Abdomen is obese, nondistended, soft and nontender. No organomegaly or masses felt. Normal bowel sounds heard. Central nervous system: No focal neurological deficits. Extremities: No cyanosis or clubbing. Skin: No rashes, no petechiae Psychiatry: Intermittently anxious, sad with flat affect; patient's mother has recently die after suddenly experiencing stroke.   Data Reviewed: I have personally reviewed following labs and imaging studies  CBC: Recent Labs  Lab 04/11/2020 1355 03/31/20 0227 04/01/20 0217 04/02/20 0532  WBC 3.3* 1.5* 2.2* 3.2*  NEUTROABS 2.5 1.0* 1.7 2.6  HGB 12.0 12.1 12.1 11.8*  HCT 38.5 38.9 40.5 38.9  MCV 90.2 90.9 92.5 90.9  PLT 100* 92* 103* 123*    Basic Metabolic Panel: Recent Labs  Lab 04/06/2020 1355 03/31/20 0227 04/01/20 0217 04/02/20 0532  NA 133* 139 140 137  K 3.5 4.2 3.9 3.4*  CL 102 105 107 107  CO2 21* 26 24 21*  GLUCOSE 174* 252* 236* 260*  BUN 15 15 23* 27*  CREATININE 1.53* 1.19* 1.34* 1.06*  CALCIUM 8.0* 8.7* 8.9 8.6*    GFR: Estimated Creatinine Clearance: 93.2 mL/min (A) (by C-G formula based on SCr of 1.06 mg/dL (H)).  Liver Function Tests: Recent Labs  Lab 04/14/2020 1355 03/31/20 0227 04/01/20 0217 04/02/20 0532  AST 113* 117* 111* 68*  ALT 56* 55* 52* 42  ALKPHOS 68 69 72 70  BILITOT 0.6 0.6 0.6 0.5  PROT 6.7 7.0 6.6 6.4*  ALBUMIN 3.4* 3.2*  3.0* 3.0*    CBG: Recent Labs  Lab 03/31/20 1138 03/31/20 1642 04/02/20 0758 04/02/20 1128 04/02/20 1605  GLUCAP 199* 221* 272* 382* 348*     Recent Results (from the past 240 hour(s))  SARS Coronavirus 2 by RT PCR (hospital order, performed in [place] hospital lab) Nasopharyngeal Nasopharyngeal Swab     Status: Abnormal   Collection Time: 04/02/2020  1:55 PM   Specimen: Nasopharyngeal Swab  Result Value Ref Range Status   SARS Coronavirus 2 POSITIVE (A) NEGATIVE Final    Comment: RESULT CALLED TO, READ BACK BY AND VERIFIED WITH: MD ON  [id number] @ 1530 BY HENDERSON L. (NOTE) SARS-CoV-2 target nucleic acids are DETECTED  SARS-CoV-2 RNA is generally detectable in upper respiratory specimens  during the acute phase of infection.  Positive results are indicative  of the presence of the identified virus, but do not rule out bacterial infection or co-infection with other pathogens not detected by the test.  Clinical correlation with patient history and  other diagnostic information is necessary to determine patient infection status.  The expected result is negative.  Fact Sheet for Patients:   StrictlyIdeas.no   Fact Sheet for Healthcare Providers:   BankingDealers.co.za    This test is not yet approved or cleared by the [place] FDA and  has been authorized for detection and/or diagnosis of SARS-CoV-2 by FDA under an Emergency Use Authorization (EUA).  This EUA will remain in effect (meaning this  test can be used) for the duration of  the COVID-19 declaration under Section 564(b)(1) of the Act, 21 U.S.C. section 360-bbb-3(b)(1), unless the authorization is terminated or revoked sooner.  Performed at [facility], [address] [place], [place] [address]   Blood Culture (routine x 2)     Status: None (Preliminary result)   Collection Time: 04/07/2020  1:55 PM   Specimen: BLOOD  Result Value Ref Range Status   Specimen  Description BLOOD BLOOD RIGHT HAND  Final   Special Requests   Final    Blood Culture adequate volume BOTTLES DRAWN AEROBIC AND ANAEROBIC   Culture   Final    NO GROWTH 3 DAYS Performed at [facility], [address], [place], [place] [address]    Report Status PENDING  Incomplete  Blood Culture (routine x 2)     Status: None (Preliminary result)   Collection Time: 04/09/2020  2:00 PM   Specimen: BLOOD  Result Value Ref Range Status   Specimen Description BLOOD RIGHT ANTECUBITAL  Final   Special Requests   Final    Blood Culture adequate volume BOTTLES DRAWN AEROBIC AND ANAEROBIC   Culture   Final    NO GROWTH 3 DAYS Performed at [facility], [address], [place], [place] [address]    Report Status PENDING  Incomplete  MRSA PCR Screening     Status: None   Collection Time: 03/31/20 12:36 AM   Specimen: Nasal Mucosa; Nasopharyngeal  Result Value Ref Range Status   MRSA by PCR NEGATIVE NEGATIVE Final    Comment:        The GeneXpert MRSA Assay (FDA approved for NASAL specimens only), is one component of a comprehensive MRSA colonization surveillance program. It is not intended to diagnose MRSA infection nor to guide or monitor treatment for MRSA infections. Performed at [facility], [address], [place], [place] [address]     Radiology Studies: No results found. Scheduled Meds: . acetaZOLAMIDE  250 mg Oral BID  . acyclovir  200 mg Oral Daily  . vitamin C  500 mg Oral Daily  . azelastine  2 spray Each Nare Daily  . baricitinib  4 mg Oral Daily  . cholecalciferol  25 mcg Oral Daily  . enoxaparin (LOVENOX) injection  60 mg Subcutaneous Q24H  . feeding supplement  237 mL Oral BID BM  . FLUoxetine  40 mg Oral Daily  . fluticasone furoate-vilanterol  1 puff Inhalation Daily  . [START ON 04/03/2020] insulin aspart  0-15 Units Subcutaneous TID WC  . insulin aspart  0-5 Units Subcutaneous QHS  . insulin detemir  8 Units Subcutaneous QHS  . loratadine  10 mg Oral Daily  .  methylPREDNISolone (SOLU-MEDROL) injection  80 mg Intravenous Q8H  . nystatin  5 mL Oral QID  . pantoprazole  40 mg Oral BID  . pregabalin  150 mg Oral QHS  . saccharomyces boulardii  250 mg Oral BID  . zinc sulfate  220 mg Oral Daily   Continuous Infusions: . remdesivir 100 mg in NS 100 mL 100 mg (04/02/20 0917)     LOS: 3 days    Time spent: 35 minutes    Barton Dubois, MD [facility]   To contact the attending provider between 7A-7P or the covering provider during after hours 7P-7A, please log into the  web site [email or web address] and access using universal [place] password for that web site. If you do not have the password, please call the hospital operator.  04/02/2020, 6:22 PM

## 2020-04-02 NOTE — Progress Notes (Signed)
Patient placed on BIPAP for the night.  Patient tolerating well at this time.  RT will continue to monitor.

## 2020-04-02 NOTE — Progress Notes (Signed)
Inpatient Diabetes Program Recommendations  AACE/ADA: New Consensus Statement on Inpatient Glycemic Control (2015)  Target Ranges:  Prepandial:   less than 140 mg/dL      Peak postprandial:   less than 180 mg/dL (1-2 hours)      Critically ill patients:  140 - 180 mg/dL   Results for ILLYANA, SCHORSCH (MRN [id number]) as of 04/02/2020 11:18  Ref. Range 03/31/2020 08:17 03/31/2020 11:38 03/31/2020 16:42 04/02/2020 07:58  Glucose-Capillary Latest Ref Range: 70 - 99 mg/dL 861 (H) 683 (H) 729 (H) 272 (H)    Admit with: Acute hypoxic respiratory failure due to COVID-19 pneumonia  No History of Diabetes noted     MD- Note patient receiving Solumedrol 80 mg Q8 hours.  CBGs elevated.  Please consider ordering Novolog Moderate Correction Scale/ SSI (0-15 units) TID AC + HS while pt getting steroids.       --Will follow patient during hospitalization--  Ambrose Finland RN, MSN, CDE Diabetes Coordinator Inpatient Glycemic Control Team Team Pager: [phone] (8a-5p)

## 2020-04-02 NOTE — Progress Notes (Signed)
Initial visit for support. She has been sleeping much of the day and thought she was awake but upon entering she had gone back to sleep. Her mother recently died. Will continue to try to offer support if possible.

## 2020-04-03 DIAGNOSIS — J9601 Acute respiratory failure with hypoxia: Secondary | ICD-10-CM | POA: Diagnosis not present

## 2020-04-03 DIAGNOSIS — U071 COVID-19: Secondary | ICD-10-CM | POA: Diagnosis not present

## 2020-04-03 DIAGNOSIS — G43809 Other migraine, not intractable, without status migrainosus: Secondary | ICD-10-CM | POA: Diagnosis not present

## 2020-04-03 DIAGNOSIS — K219 Gastro-esophageal reflux disease without esophagitis: Secondary | ICD-10-CM | POA: Diagnosis not present

## 2020-04-03 LAB — COMPREHENSIVE METABOLIC PANEL
ALT: 73 U/L — ABNORMAL HIGH (ref 0–44)
AST: 108 U/L — ABNORMAL HIGH (ref 15–41)
Albumin: 2.9 g/dL — ABNORMAL LOW (ref 3.5–5.0)
Alkaline Phosphatase: 81 U/L (ref 38–126)
Anion gap: 8 (ref 5–15)
BUN: 28 mg/dL — ABNORMAL HIGH (ref 6–20)
CO2: 20 mmol/L — ABNORMAL LOW (ref 22–32)
Calcium: 8.4 mg/dL — ABNORMAL LOW (ref 8.9–10.3)
Chloride: 109 mmol/L (ref 98–111)
Creatinine, Ser: 1.11 mg/dL — ABNORMAL HIGH (ref 0.44–1.00)
GFR, Estimated: >60 mL/min (ref >60)
Glucose, Bld: 295 mg/dL — ABNORMAL HIGH (ref 70–99)
Potassium: 3.6 mmol/L (ref 3.5–5.1)
Sodium: 137 mmol/L (ref 135–145)
Total Bilirubin: 0.7 mg/dL (ref 0.3–1.2)
Total Protein: 6.2 g/dL — ABNORMAL LOW (ref 6.5–8.1)

## 2020-04-03 LAB — D-DIMER, QUANTITATIVE: D-Dimer, Quant: 0.82 ug/mL-FEU — ABNORMAL HIGH (ref 0.00–0.50)

## 2020-04-03 LAB — GLUCOSE, CAPILLARY
Glucose-Capillary: 296 mg/dL — ABNORMAL HIGH (ref 70–99)
Glucose-Capillary: 253 mg/dL — ABNORMAL HIGH (ref 70–99)
Glucose-Capillary: 230 mg/dL — ABNORMAL HIGH (ref 70–99)
Glucose-Capillary: 209 mg/dL — ABNORMAL HIGH (ref 70–99)

## 2020-04-03 LAB — CBC WITH DIFFERENTIAL/PLATELET
Abs Immature Granulocytes: 0.05 10*3/uL (ref 0.00–0.07)
Basophils Absolute: 0 10*3/uL (ref 0.0–0.1)
Basophils Relative: 0 %
Eosinophils Absolute: 0 10*3/uL (ref 0.0–0.5)
Eosinophils Relative: 0 %
HCT: 38.7 % (ref 36.0–46.0)
Hemoglobin: 11.8 g/dL — ABNORMAL LOW (ref 12.0–15.0)
Immature Granulocytes: 1 %
Lymphocytes Relative: 10 %
Lymphs Abs: 0.4 10*3/uL — ABNORMAL LOW (ref 0.7–4.0)
MCH: 27.8 pg (ref 26.0–34.0)
MCHC: 30.5 g/dL (ref 30.0–36.0)
MCV: 91.1 fL (ref 80.0–100.0)
Monocytes Absolute: 0.2 10*3/uL (ref 0.1–1.0)
Monocytes Relative: 6 %
Neutro Abs: 3 10*3/uL (ref 1.7–7.7)
Neutrophils Relative %: 83 %
Platelets: 125 10*3/uL — ABNORMAL LOW (ref 150–400)
RBC: 4.25 MIL/uL (ref 3.87–5.11)
RDW: 13.6 % (ref 11.5–15.5)
WBC: 3.6 10*3/uL — ABNORMAL LOW (ref 4.0–10.5)
nRBC: 0 % (ref 0.0–0.2)

## 2020-04-03 LAB — HEMOGLOBIN A1C
Hgb A1c MFr Bld: 6.6 % — ABNORMAL HIGH (ref 4.8–5.6)
Mean Plasma Glucose: 142.72 mg/dL

## 2020-04-03 LAB — C-REACTIVE PROTEIN: CRP: 1.5 mg/dL — ABNORMAL HIGH (ref <1.0)

## 2020-04-03 MED ORDER — SALINE SPRAY 0.65 % NA SOLN
1.0000 | NASAL | Status: DC | PRN
  Administered 2020-04-06: 1 via NASAL
  Filled 2020-04-03: qty 44

## 2020-04-03 MED ORDER — METHOCARBAMOL 500 MG PO TABS
500.0000 mg | ORAL_TABLET | Freq: Three times a day (TID) | ORAL | Status: DC | PRN
  Administered 2020-04-03 – 2020-04-06 (×8): 500 mg via ORAL
  Filled 2020-04-03 (×8): qty 1

## 2020-04-03 NOTE — Progress Notes (Signed)
PROGRESS NOTE    Sandra [name]  GNF:[id number] DOB: 1975-11-29 DOA: 04/12/2020 PCP: Glenda Chroman, MD   Chief Complaint  Patient presents with  . Shortness of Breath    Brief admission Narrative:  Sandra [name]  is a 45 y.o. female, nickel history of GERD,vertigo, depression, anxiety, pseudotumor cerebri, benign intracranial hypertension, with VP shunt, patient is unvaccinated against Covid, she presents with shortness of breath, cough, patient diagnosed with COVID-19 7 days ago with a home kit, she reports cough, shortness of breath, fatigue and body ache, but her symptoms continue to worsen, report dyspnea has worsened over the last 2 to 3 days, as well she reports chest pain related to deep breathing, and coughing, prompted her to come to ED. - in ED patient saturation 73% on room air, she is currently on 15 L high flow nasal cannula, CTA chest negative for PE, but significant for severe COVID-19  pneumonia, creatinine elevated at 1.5, AST elevated at 113, [facility] hospitalist consulted to admit.  Assessment & Plan: 1-acute respiratory failure with hypoxia secondary to COVID-19 pneumonia -Continue to follow inflammatory markers -Continue treatment with steroids, remdesivir and baricitinib. -Continue to wean oxygen supplementation as tolerated -Patient has been encourage to participate with proning position, incentive spirometer and flutter valve. -Continue as needed antitussive medications, vitamin C and zinc. -Will continue BiPAP at bedtime. -will add robaxin PRN to assist with intercostal muscles.  2-Morbid obesity (HCC) -Body mass index is 44.16 kg/m. -Low calorie diet, portion control increase physical activity discussed with patient. -With concerns for obesity hypoventilation syndrome, sleep apnea; patient significant other reporting episodes of apnea in the middle of the night and snoring prior to admission or being sick.  3-depression/anxiety -Starting Xanax (alert social  dysfunction and family sickness actively affecting patient) -Continue Lexapro. -No suicidal ideation or hallucinations currently.  4-GERD (gastroesophageal reflux disease) -Continue PPI.  5-Pseudotumor cerebri -Continue Diamox -Patient denies headaches currently.  6-acute kidney injury -In the setting of prerenal azotemia, dehydration and transient hypotension -Hold HCTZ and Lasix -Minimize nephrotoxic medications -Continue to maintain adequate hydration -Follow renal function trend.  7-mild transaminitis -AST 113; most likely in the setting of acute COVID infection -Holding statins currently. -Follow LFTs.  8-history of migraines -Continue as needed Fioricet and rizatriptan.  9-history of COPD -Currently no wheezing -Continue home inhaler regimen. -Steroids treatment as mentioned above, assisting with condition stability.   DVT prophylaxis:Lovenox Code Status: Full code. Family Communication: Patient's boyfriend/significant other at bedside. Disposition:   Status is: Inpatient  Dispo: The patient is from: Home              Anticipated d/c is to: Home              Anticipated d/c date is: To be determined              Patient currently no medically stable for discharge; still requiring significant amount of oxygen supplementation (15 L high flow nasal cannula along with nonrebreather), continue treatment with IV steroids, remdesivir and baricitinib.   Difficult to place patient: No       Consultants:   None   Procedures:  See below for x-ray reports.  Antimicrobials/antiviral Remdesivir 4 out of 5  Subjective: No fever, no nausea, no vomiting.  Complaining of intermittent coughing spells and discomfort in her intercostal muscles.  Continues to require significant amount of oxygen supplementation.  Reports an improvement at night after the use of BiPAP.  Objective: Vitals:   04/03/20 0800 04/03/20 0831  04/03/20 0900 04/03/20 1000  BP: (!) 113/51   (!)  118/49  Pulse: (!) 47  (!) 54 65  Resp: (!) '23  17 15  ' Temp:      TempSrc:      SpO2: 90% (!) 89% 93% 90%  Weight:      Height:       No intake or output data in the 24 hours ending 04/03/20 1109 Filed Weights   04/15/2020 1337 03/31/20 0038 04/01/20 0503  Weight: 122.5 kg 123.3 kg 127.9 kg    Examination: General exam: Alert, awake, oriented x 3, no fever, no nausea, no vomiting; expressed intercostal discomfort from coughing spells; still requiring 15 L high flow nasal cannula along with nonrebreather; demonstrated good tolerance to the use of BiPAP, with improvement in her oxygen saturation and aggressive night.   Respiratory system: No wheezing, no crackles, no using accessory muscle.  Positive rhonchi bilaterally appreciated. Cardiovascular system:RRR. No murmurs, rubs, gallops.  No JVD. Gastrointestinal system: Abdomen is obese, nondistended, soft and nontender. No organomegaly or masses felt. Normal bowel sounds heard. Central nervous system: Alert and oriented. No focal neurological deficits. Extremities: No cyanosis or clubbing. Skin: No petechiae. Psychiatry: Judgement and insight appear normal.  Anxious mood and with flat affect.   Data Reviewed: I have personally reviewed following labs and imaging studies  CBC: Recent Labs  Lab 04/08/2020 1355 03/31/20 0227 04/01/20 0217 04/02/20 0532 04/03/20 0620  WBC 3.3* 1.5* 2.2* 3.2* 3.6*  NEUTROABS 2.5 1.0* 1.7 2.6 3.0  HGB 12.0 12.1 12.1 11.8* 11.8*  HCT 38.5 38.9 40.5 38.9 38.7  MCV 90.2 90.9 92.5 90.9 91.1  PLT 100* 92* 103* 123* 125*    Basic Metabolic Panel: Recent Labs  Lab 04/01/2020 1355 03/31/20 0227 04/01/20 0217 04/02/20 0532 04/03/20 0620  NA 133* 139 140 137 137  K 3.5 4.2 3.9 3.4* 3.6  CL 102 105 107 107 109  CO2 21* 26 24 21* 20*  GLUCOSE 174* 252* 236* 260* 295*  BUN 15 15 23* 27* 28*  CREATININE 1.53* 1.19* 1.34* 1.06* 1.11*  CALCIUM 8.0* 8.7* 8.9 8.6* 8.4*    GFR: Estimated Creatinine  Clearance: 89 mL/min (A) (by C-G formula based on SCr of 1.11 mg/dL (H)).  Liver Function Tests: Recent Labs  Lab 04/06/2020 1355 03/31/20 0227 04/01/20 0217 04/02/20 0532 04/03/20 0620  AST 113* 117* 111* 68* 108*  ALT 56* 55* 52* 42 73*  ALKPHOS 68 69 72 70 81  BILITOT 0.6 0.6 0.6 0.5 0.7  PROT 6.7 7.0 6.6 6.4* 6.2*  ALBUMIN 3.4* 3.2* 3.0* 3.0* 2.9*    CBG: Recent Labs  Lab 04/02/20 0758 04/02/20 1128 04/02/20 1605 04/02/20 2143 04/03/20 0716  GLUCAP 272* 382* 348* 357* 296*     Recent Results (from the past 240 hour(s))  SARS Coronavirus 2 by RT PCR (hospital order, performed in [facility] hospital lab) Nasopharyngeal Nasopharyngeal Swab     Status: Abnormal   Collection Time: 04/14/2020  1:55 PM   Specimen: Nasopharyngeal Swab  Result Value Ref Range Status   SARS Coronavirus 2 POSITIVE (A) NEGATIVE Final    Comment: RESULT CALLED TO, READ BACK BY AND VERIFIED WITH: MD ON  [id number] @ 1530 BY HENDERSON L. (NOTE) SARS-CoV-2 target nucleic acids are DETECTED  SARS-CoV-2 RNA is generally detectable in upper respiratory specimens  during the acute phase of infection.  Positive results are indicative  of the presence of the identified virus, but do not rule out bacterial infection or  co-infection with other pathogens not detected by the test.  Clinical correlation with patient history and  other diagnostic information is necessary to determine patient infection status.  The expected result is negative.  Fact Sheet for Patients:   StrictlyIdeas.no   Fact Sheet for Healthcare Providers:   BankingDealers.co.za    This test is not yet approved or cleared by the [place] FDA and  has been authorized for detection and/or diagnosis of SARS-CoV-2 by FDA under an Emergency Use Authorization (EUA).  This EUA will remain in effect (meaning this  test can be used) for the duration of  the COVID-19 declaration under Section  564(b)(1) of the Act, 21 U.S.C. section 360-bbb-3(b)(1), unless the authorization is terminated or revoked sooner.  Performed at [facility], [address] [place], [place] [address]   Blood Culture (routine x 2)     Status: None (Preliminary result)   Collection Time: 04/17/2020  1:55 PM   Specimen: BLOOD  Result Value Ref Range Status   Specimen Description BLOOD BLOOD RIGHT HAND  Final   Special Requests   Final    Blood Culture adequate volume BOTTLES DRAWN AEROBIC AND ANAEROBIC   Culture   Final    NO GROWTH 4 DAYS Performed at [facility], [address], [place], [place] [address]    Report Status PENDING  Incomplete  Blood Culture (routine x 2)     Status: None (Preliminary result)   Collection Time: 04/17/2020  2:00 PM   Specimen: BLOOD  Result Value Ref Range Status   Specimen Description BLOOD RIGHT ANTECUBITAL  Final   Special Requests   Final    Blood Culture adequate volume BOTTLES DRAWN AEROBIC AND ANAEROBIC   Culture   Final    NO GROWTH 4 DAYS Performed at [facility], [address] [place], [place] [address]    Report Status PENDING  Incomplete  MRSA PCR Screening     Status: None   Collection Time: 03/31/20 12:36 AM   Specimen: Nasal Mucosa; Nasopharyngeal  Result Value Ref Range Status   MRSA by PCR NEGATIVE NEGATIVE Final    Comment:        The GeneXpert MRSA Assay (FDA approved for NASAL specimens only), is one component of a comprehensive MRSA colonization surveillance program. It is not intended to diagnose MRSA infection nor to guide or monitor treatment for MRSA infections. Performed at [facility], [address], [place], [place] [address]     Radiology Studies: No results found. Scheduled Meds: . acetaZOLAMIDE  250 mg Oral BID  . acyclovir  200 mg Oral Daily  . vitamin C  500 mg Oral Daily  . azelastine  2 spray Each Nare Daily  . baricitinib  4 mg Oral Daily  . cholecalciferol  25 mcg Oral Daily  . enoxaparin (LOVENOX) injection   60 mg Subcutaneous Q24H  . feeding supplement  237 mL Oral BID BM  . FLUoxetine  40 mg Oral Daily  . fluticasone furoate-vilanterol  1 puff Inhalation Daily  . insulin aspart  0-15 Units Subcutaneous TID WC  . insulin aspart  0-5 Units Subcutaneous QHS  . insulin detemir  8 Units Subcutaneous QHS  . loratadine  10 mg Oral Daily  . methylPREDNISolone (SOLU-MEDROL) injection  80 mg Intravenous Q8H  . nystatin  5 mL Oral QID  . pantoprazole  40 mg Oral BID  . pregabalin  150 mg Oral QHS  . saccharomyces boulardii  250 mg Oral BID  .  zinc sulfate  220 mg Oral Daily   Continuous Infusions: . remdesivir 100 mg in NS 100 mL 100 mg (04/03/20 1036)     LOS: 4 days    Time spent: 35 minutes    Barton Dubois, MD [facility]   To contact the attending provider between 7A-7P or the covering provider during after hours 7P-7A, please log into the web site [email or web address] and access using universal [place] password for that web site. If you do not have the password, please call the hospital operator.  04/03/2020, 11:09 AM

## 2020-04-04 DIAGNOSIS — G43809 Other migraine, not intractable, without status migrainosus: Secondary | ICD-10-CM | POA: Diagnosis not present

## 2020-04-04 DIAGNOSIS — U071 COVID-19: Secondary | ICD-10-CM | POA: Diagnosis not present

## 2020-04-04 DIAGNOSIS — J9601 Acute respiratory failure with hypoxia: Secondary | ICD-10-CM | POA: Diagnosis not present

## 2020-04-04 DIAGNOSIS — K219 Gastro-esophageal reflux disease without esophagitis: Secondary | ICD-10-CM | POA: Diagnosis not present

## 2020-04-04 LAB — CBC WITH DIFFERENTIAL/PLATELET
Abs Immature Granulocytes: 0.06 10*3/uL (ref 0.00–0.07)
Basophils Absolute: 0 10*3/uL (ref 0.0–0.1)
Basophils Relative: 0 %
Eosinophils Absolute: 0 10*3/uL (ref 0.0–0.5)
Eosinophils Relative: 0 %
HCT: 37.8 % (ref 36.0–46.0)
Hemoglobin: 12.1 g/dL (ref 12.0–15.0)
Immature Granulocytes: 1 %
Lymphocytes Relative: 13 %
Lymphs Abs: 0.5 10*3/uL — ABNORMAL LOW (ref 0.7–4.0)
MCH: 28.2 pg (ref 26.0–34.0)
MCHC: 32 g/dL (ref 30.0–36.0)
MCV: 88.1 fL (ref 80.0–100.0)
Monocytes Absolute: 0.2 10*3/uL (ref 0.1–1.0)
Monocytes Relative: 5 %
Neutro Abs: 3.5 10*3/uL (ref 1.7–7.7)
Neutrophils Relative %: 81 %
Platelets: 124 10*3/uL — ABNORMAL LOW (ref 150–400)
RBC: 4.29 MIL/uL (ref 3.87–5.11)
RDW: 13.5 % (ref 11.5–15.5)
WBC: 4.3 10*3/uL (ref 4.0–10.5)
nRBC: 0 % (ref 0.0–0.2)

## 2020-04-04 LAB — COMPREHENSIVE METABOLIC PANEL
ALT: 55 U/L — ABNORMAL HIGH (ref 0–44)
AST: 47 U/L — ABNORMAL HIGH (ref 15–41)
Albumin: 2.9 g/dL — ABNORMAL LOW (ref 3.5–5.0)
Alkaline Phosphatase: 75 U/L (ref 38–126)
Anion gap: 7 (ref 5–15)
BUN: 32 mg/dL — ABNORMAL HIGH (ref 6–20)
CO2: 22 mmol/L (ref 22–32)
Calcium: 8.5 mg/dL — ABNORMAL LOW (ref 8.9–10.3)
Chloride: 109 mmol/L (ref 98–111)
Creatinine, Ser: 1.02 mg/dL — ABNORMAL HIGH (ref 0.44–1.00)
GFR, Estimated: >60 mL/min (ref >60)
Glucose, Bld: 234 mg/dL — ABNORMAL HIGH (ref 70–99)
Potassium: 3.5 mmol/L (ref 3.5–5.1)
Sodium: 138 mmol/L (ref 135–145)
Total Bilirubin: 0.8 mg/dL (ref 0.3–1.2)
Total Protein: 5.8 g/dL — ABNORMAL LOW (ref 6.5–8.1)

## 2020-04-04 LAB — GLUCOSE, CAPILLARY
Glucose-Capillary: 225 mg/dL — ABNORMAL HIGH (ref 70–99)
Glucose-Capillary: 248 mg/dL — ABNORMAL HIGH (ref 70–99)
Glucose-Capillary: 239 mg/dL — ABNORMAL HIGH (ref 70–99)
Glucose-Capillary: 257 mg/dL — ABNORMAL HIGH (ref 70–99)

## 2020-04-04 LAB — D-DIMER, QUANTITATIVE: D-Dimer, Quant: 0.7 ug/mL-FEU — ABNORMAL HIGH (ref 0.00–0.50)

## 2020-04-04 LAB — CULTURE, BLOOD (ROUTINE X 2)
Culture: NO GROWTH
Culture: NO GROWTH
Special Requests: ADEQUATE
Special Requests: ADEQUATE

## 2020-04-04 LAB — C-REACTIVE PROTEIN: CRP: 3 mg/dL — ABNORMAL HIGH (ref <1.0)

## 2020-04-04 MED ORDER — OXYCODONE HCL 5 MG PO TABS
5.0000 mg | ORAL_TABLET | Freq: Four times a day (QID) | ORAL | Status: DC | PRN
Start: 2020-04-04 — End: 2020-04-14
  Administered 2020-04-04 – 2020-04-12 (×12): 5 mg via ORAL
  Filled 2020-04-04 (×12): qty 1

## 2020-04-04 MED ORDER — INSULIN DETEMIR 100 UNIT/ML ~~LOC~~ SOLN
9.0000 [IU] | Freq: Two times a day (BID) | SUBCUTANEOUS | Status: DC
  Administered 2020-04-04: 9 [IU] via SUBCUTANEOUS
  Filled 2020-04-04 (×2): qty 0.09

## 2020-04-04 NOTE — Progress Notes (Signed)
Pt taken off BIPAP and placed on 15l salter and NRB spo2 91%

## 2020-04-04 NOTE — Progress Notes (Signed)
PROGRESS NOTE    Sandra [name]  MLY:[id number] DOB: [birth year] DOA: 03/28/2020 PCP: Glenda Chroman, MD   Chief Complaint  Patient presents with  . Shortness of Breath    Brief admission Narrative:  Sandra [name]  is a 45 y.o. female, nickel history of GERD,vertigo, depression, anxiety, pseudotumor cerebri, benign intracranial hypertension, with VP shunt, patient is unvaccinated against Covid, she presents with shortness of breath, cough, patient diagnosed with COVID-19 7 days ago with a home kit, she reports cough, shortness of breath, fatigue and body ache, but her symptoms continue to worsen, report dyspnea has worsened over the last 2 to 3 days, as well she reports chest pain related to deep breathing, and coughing, prompted her to come to ED. - in ED patient saturation 73% on room air, she is currently on 15 L high flow nasal cannula, CTA chest negative for PE, but significant for severe COVID-19  pneumonia, creatinine elevated at 1.5, AST elevated at 113, [facility] hospitalist consulted to admit.  Assessment & Plan: 1-acute respiratory failure with hypoxia secondary to COVID-19 pneumonia -Continue to follow inflammatory markers -Continue treatment with steroids, remdesivir and baricitinib. -Continue to wean oxygen supplementation as tolerated -Patient has been encourage to participate with proning position, incentive spirometer and flutter valve. -Continue as needed antitussive medications, vitamin C and zinc. -Will continue BiPAP at bedtime. -will add low-dose oxycodone PRN to assist with intercostal muscles and abdominal pain.  Continue also to use Robaxin as needed.  2-Morbid obesity (HCC) -Body mass index is 44.16 kg/m. -Low calorie diet, portion control increase physical activity discussed with patient. -With concerns for obesity hypoventilation syndrome, sleep apnea; patient significant other reporting episodes of apnea in the middle of the night and snoring prior to admission or  being sick.  3-depression/anxiety -Starting Xanax (alert social dysfunction and family sickness actively affecting patient) -Continue Lexapro. -No suicidal ideation or hallucinations currently.  4-GERD (gastroesophageal reflux disease) -Continue PPI.  5-Pseudotumor cerebri -Continue Diamox -Patient denies headaches currently.  6-acute kidney injury -In the setting of prerenal azotemia, dehydration and transient hypotension -Hold HCTZ and Lasix -Minimize nephrotoxic medications -Continue to maintain adequate hydration -Follow renal function trend.  7-mild transaminitis -AST 113; most likely in the setting of acute COVID infection -Holding statins currently. -Follow LFTs.  8-history of migraines -Continue as needed Fioricet and rizatriptan.  9-history of COPD -Currently no wheezing -Continue home inhaler regimen. -Steroids treatment as mentioned above, assisting with condition stability.  10-type 2 diabetes with hyperglycemia -No prior history of diabetes or using hypoglycemic agents prior to admission -Steroids therapy causing hyperglycemia -Continue sliding scale insulin and adjusted dose of Levemir -Continue to follow CBGs and adjust hypoglycemic regimen as required. -At discharge patient will benefit of initiation of Metformin.   DVT prophylaxis:Lovenox Code Status: Full code. Family Communication: Patient's boyfriend/significant other at bedside. Disposition:   Status is: Inpatient  Dispo: The patient is from: Home              Anticipated d/c is to: Home              Anticipated d/c date is: To be determined              Patient currently no medically stable for discharge; still requiring significant amount of oxygen supplementation (15 L high flow nasal cannula along with nonrebreather), continue treatment with IV steroids, remdesivir (last dose) and baricitinib.  Using BiPAP nightly.   Difficult to place patient: No       Consultants:  None   Procedures:  See below for x-ray reports.  Antimicrobials/antiviral Remdesivir 5 out of 5  Subjective: No fever, no nausea, no vomiting.  Still short winded with minimal activity and desaturating despite supplementation while trying to use the bedside commode.  Patient reports pleuritic chest discomfort with coughing spells and also mid abdominal pain.  Objective: Vitals:   04/04/20 0800 04/04/20 0900 04/04/20 1000 04/04/20 1200  BP: (!) 147/80  137/74 (!) 143/79  Pulse: (!) 50  62 (!) 56  Resp: _0 Temp:  98.3 F (36.8 C)    TempSrc:  Oral    SpO2: 96%  91% 94%  Weight:      Height:        Intake/Output Summary (Last 24 hours) at 04/04/2020 1300 Last data filed at 04/03/2020 1800 Gross per 24 hour  Intake 340 ml  Output -  Net 340 ml   Filed Weights   04/16/2020 1337 03/31/20 0038 04/01/20 0503  Weight: 122.5 kg 123.3 kg 127.9 kg    Examination: General exam: Alert, awake, oriented x 3; reports tolerating BiPAP good and nighttime.  Still complaining of pleuritic chest discomfort with coughing spells and also complaining of mid abdominal discomfort.  No nausea, no vomiting, continue to be short winded with minimal exertion and using high flow and nonrebreather supplementation during the day.  Positive intermittent coughing spells. Respiratory system: No wheezing, no crackles, no using accessory muscles during exam.  Positive rhonchi. Cardiovascular system:RRR. No murmurs, rubs, gallops.  No JVD. Gastrointestinal system: Abdomen is obese, nondistended, soft and without guarding. No organomegaly or masses felt. Normal bowel sounds heard. Central nervous system: Alert and oriented. No focal neurological deficits. Extremities: No cyanosis or clubbing. Skin: No petechiae. Psychiatry: Judgement and insight appear normal. Mood & affect appropriate.    Data Reviewed: I have personally reviewed following labs and imaging studies  CBC: Recent Labs  Lab  03/31/20 0227 04/01/20 0217 04/02/20 0532 04/03/20 0620 04/04/20 0520  WBC 1.5* 2.2* 3.2* 3.6* 4.3  NEUTROABS 1.0* 1.7 2.6 3.0 3.5  HGB 12.1 12.1 11.8* 11.8* 12.1  HCT 38.9 40.5 38.9 38.7 37.8  MCV 90.9 92.5 90.9 91.1 88.1  PLT 92* 103* 123* 125* 124*    Basic Metabolic Panel: Recent Labs  Lab 03/31/20 0227 04/01/20 0217 04/02/20 0532 04/03/20 0620 04/04/20 0520  NA 139 140 137 137 138  K 4.2 3.9 3.4* 3.6 3.5  CL 105 107 107 109 109  CO2 26 24 21* 20* 22  GLUCOSE 252* 236* 260* 295* 234*  BUN 15 23* 27* 28* 32*  CREATININE 1.19* 1.34* 1.06* 1.11* 1.02*  CALCIUM 8.7* 8.9 8.6* 8.4* 8.5*    GFR: Estimated Creatinine Clearance: 96.9 mL/min (A) (by C-G formula based on SCr of 1.02 mg/dL (H)).  Liver Function Tests: Recent Labs  Lab 03/31/20 0227 04/01/20 0217 04/02/20 0532 04/03/20 0620 04/04/20 0520  AST 117* 111* 68* 108* 47*  ALT 55* 52* 42 73* 55*  ALKPHOS 69 72 70 81 75  BILITOT 0.6 0.6 0.5 0.7 0.8  PROT 7.0 6.6 6.4* 6.2* 5.8*  ALBUMIN 3.2* 3.0* 3.0* 2.9* 2.9*    CBG: Recent Labs  Lab 04/03/20 1113 04/03/20 1622 04/03/20 2047 04/04/20 0811 04/04/20 1211  GLUCAP 253* 230* 209* 225* 248*     Recent Results (from the past 240 hour(s))  SARS Coronavirus 2 by RT PCR (hospital order, performed in [facility] hospital lab) Nasopharyngeal Nasopharyngeal Swab     Status: Abnormal  Collection Time: 03/28/2020  1:55 PM   Specimen: Nasopharyngeal Swab  Result Value Ref Range Status   SARS Coronavirus 2 POSITIVE (A) NEGATIVE Final    Comment: RESULT CALLED TO, READ BACK BY AND VERIFIED WITH: MD ON  [id number] @ 1530 BY HENDERSON L. (NOTE) SARS-CoV-2 target nucleic acids are DETECTED  SARS-CoV-2 RNA is generally detectable in upper respiratory specimens  during the acute phase of infection.  Positive results are indicative  of the presence of the identified virus, but do not rule out bacterial infection or co-infection with other pathogens not detected by  the test.  Clinical correlation with patient history and  other diagnostic information is necessary to determine patient infection status.  The expected result is negative.  Fact Sheet for Patients:   StrictlyIdeas.no   Fact Sheet for Healthcare Providers:   BankingDealers.co.za    This test is not yet approved or cleared by the [place] FDA and  has been authorized for detection and/or diagnosis of SARS-CoV-2 by FDA under an Emergency Use Authorization (EUA).  This EUA will remain in effect (meaning this  test can be used) for the duration of  the COVID-19 declaration under Section 564(b)(1) of the Act, 21 U.S.C. section 360-bbb-3(b)(1), unless the authorization is terminated or revoked sooner.  Performed at [facility], [address] [place], [place] [address]   Blood Culture (routine x 2)     Status: None   Collection Time: 04/08/2020  1:55 PM   Specimen: BLOOD  Result Value Ref Range Status   Specimen Description BLOOD BLOOD RIGHT HAND  Final   Special Requests   Final    Blood Culture adequate volume BOTTLES DRAWN AEROBIC AND ANAEROBIC   Culture   Final    NO GROWTH 5 DAYS Performed at [facility], [address] [place], [place] [address]    Report Status 04/04/2020 FINAL  Final  Blood Culture (routine x 2)     Status: None   Collection Time: 04/22/2020  2:00 PM   Specimen: BLOOD  Result Value Ref Range Status   Specimen Description BLOOD RIGHT ANTECUBITAL  Final   Special Requests   Final    Blood Culture adequate volume BOTTLES DRAWN AEROBIC AND ANAEROBIC   Culture   Final    NO GROWTH 5 DAYS Performed at [facility], [address], [place], [place] [address]    Report Status 04/04/2020 FINAL  Final  MRSA PCR Screening     Status: None   Collection Time: 03/31/20 12:36 AM   Specimen: Nasal Mucosa; Nasopharyngeal  Result Value Ref Range Status   MRSA by PCR NEGATIVE NEGATIVE Final    Comment:        The  GeneXpert MRSA Assay (FDA approved for NASAL specimens only), is one component of a comprehensive MRSA colonization surveillance program. It is not intended to diagnose MRSA infection nor to guide or monitor treatment for MRSA infections. Performed at [facility], [address], [place], [place] [address]     Radiology Studies: No results found. Scheduled Meds: . acetaZOLAMIDE  250 mg Oral BID  . acyclovir  200 mg Oral Daily  . vitamin C  500 mg Oral Daily  . azelastine  2 spray Each Nare Daily  . baricitinib  4 mg Oral Daily  . cholecalciferol  25 mcg Oral Daily  . enoxaparin (LOVENOX) injection  60 mg Subcutaneous Q24H  . feeding supplement  237 mL Oral BID BM  . FLUoxetine  40 mg Oral  Daily  . fluticasone furoate-vilanterol  1 puff Inhalation Daily  . insulin aspart  0-15 Units Subcutaneous TID WC  . insulin aspart  0-5 Units Subcutaneous QHS  . insulin detemir  9 Units Subcutaneous BID  . loratadine  10 mg Oral Daily  . methylPREDNISolone (SOLU-MEDROL) injection  80 mg Intravenous Q8H  . nystatin  5 mL Oral QID  . pantoprazole  40 mg Oral BID  . pregabalin  150 mg Oral QHS  . saccharomyces boulardii  250 mg Oral BID  . zinc sulfate  220 mg Oral Daily   Continuous Infusions:    LOS: 5 days    Time spent: 35 minutes    Barton Dubois, MD [facility]   To contact the attending provider between 7A-7P or the covering provider during after hours 7P-7A, please log into the web site [email or web address] and access using universal [place] password for that web site. If you do not have the password, please call the hospital operator.  04/04/2020, 1:00 PM

## 2020-04-04 NOTE — Progress Notes (Signed)
Inpatient Diabetes Program Recommendations  AACE/ADA: New Consensus Statement on Inpatient Glycemic Control (2015)  Target Ranges:  Prepandial:   less than 140 mg/dL      Peak postprandial:   less than 180 mg/dL (1-2 hours)      Critically ill patients:  140 - 180 mg/dL   Lab Results  Component Value Date   GLUCAP 248 (H) 04/04/2020   HGBA1C 6.6 (H) 04/02/2020    Review of Glycemic Control Results for Sandra [name], Sandra [name] (MRN [id number]) as of 04/04/2020 12:38  Ref. Range 04/03/2020 11:13 04/03/2020 16:22 04/03/2020 20:47 04/04/2020 08:11 04/04/2020 12:11  Glucose-Capillary Latest Ref Range: 70 - 99 mg/dL 374 (H) 827 (H) 078 (H) 225 (H) 248 (H)   Inpatient Diabetes Program Recommendations:   -Increase Levemir 8 units to bid (0.15 units/kg x 127.9 kg = 19 units daily) Secure chat sent to Dr. Gwenlyn Perking.  Thank you, Billy Fischer. Kaydan Wong, RN, MSN, CDE  Diabetes Coordinator Inpatient Glycemic Control Team Team Pager [phone] (8am-5pm) 04/04/2020 12:40 PM

## 2020-04-05 DIAGNOSIS — U071 COVID-19: Secondary | ICD-10-CM | POA: Diagnosis not present

## 2020-04-05 DIAGNOSIS — N179 Acute kidney failure, unspecified: Secondary | ICD-10-CM | POA: Diagnosis not present

## 2020-04-05 DIAGNOSIS — J069 Acute upper respiratory infection, unspecified: Secondary | ICD-10-CM | POA: Diagnosis not present

## 2020-04-05 LAB — GLUCOSE, CAPILLARY
Glucose-Capillary: 282 mg/dL — ABNORMAL HIGH (ref 70–99)
Glucose-Capillary: 297 mg/dL — ABNORMAL HIGH (ref 70–99)
Glucose-Capillary: 195 mg/dL — ABNORMAL HIGH (ref 70–99)
Glucose-Capillary: 143 mg/dL — ABNORMAL HIGH (ref 70–99)

## 2020-04-05 MED ORDER — INSULIN DETEMIR 100 UNIT/ML ~~LOC~~ SOLN
12.0000 [IU] | Freq: Two times a day (BID) | SUBCUTANEOUS | Status: DC
  Administered 2020-04-05 – 2020-04-06 (×3): 12 [IU] via SUBCUTANEOUS
  Filled 2020-04-05 (×9): qty 0.12

## 2020-04-05 MED ORDER — METHYLPREDNISOLONE SODIUM SUCC 125 MG IJ SOLR
60.0000 mg | Freq: Three times a day (TID) | INTRAMUSCULAR | Status: DC
  Administered 2020-04-05 – 2020-04-09 (×13): 60 mg via INTRAVENOUS
  Filled 2020-04-05 (×12): qty 2

## 2020-04-05 NOTE — Progress Notes (Signed)
PROGRESS NOTE    Sandra [name]  MBW:[id number] DOB: 11-24-1975 DOA: 04/13/2020 PCP: Glenda Chroman, MD   Chief Complaint  Patient presents with  . Shortness of Breath    Brief admission Narrative:  Sandra [name]  is a 45 y.o. female, nickel history of GERD,vertigo, depression, anxiety, pseudotumor cerebri, benign intracranial hypertension, with VP shunt, patient is unvaccinated against Covid, she presents with shortness of breath, cough, patient diagnosed with COVID-19 7 days ago with a home kit, she reports cough, shortness of breath, fatigue and body ache, but her symptoms continue to worsen, report dyspnea has worsened over the last 2 to 3 days, as well she reports chest pain related to deep breathing, and coughing, prompted her to come to ED. - in ED patient saturation 73% on room air, she is currently on 15 L high flow nasal cannula, CTA chest negative for PE, but significant for severe COVID-19  pneumonia, creatinine elevated at 1.5, AST elevated at 113, [facility] hospitalist consulted to admit.  Assessment & Plan: 1-acute respiratory failure with hypoxia secondary to COVID-19 pneumonia -Continue to follow inflammatory markers -Continue treatment with steroids, remdesivir and baricitinib. -Continue to wean oxygen supplementation as tolerated -Patient has been encourage to participate with proning position, incentive spirometer and flutter valve. -Continue as needed antitussive medications, vitamin C and zinc. -did not tolerate  BiPAP well overnight (Claustrophobic)--   Continue also to use Robaxin and prn oxycodone as needed. Today requiring [place] at 15 L plus NRB to keep sats > 90 %  2-Morbid obesity (HCC) -Body mass index is 40.64 kg/m. -Low calorie diet, portion control increase physical activity discussed with patient. -With concerns for obesity hypoventilation syndrome, sleep apnea; patient significant other reporting episodes of apnea in the middle of the night and snoring prior to  admission or being sick. --Bipap qhs advised---pt states makes her claustrophobic   3-depression/anxiety -Starting Xanax (alert social dysfunction and family sickness actively affecting patient) -Continue Lexapro. -No suicidal ideation or hallucinations currently.  4-GERD (gastroesophageal reflux disease) -Continue PPI.  5-Pseudotumor cerebri -Continue Diamox -Patient denies headaches currently.  6-acute kidney injury -In the setting of prerenal azotemia, dehydration and transient hypotension -Hold HCTZ and Lasix -Minimize nephrotoxic medications -Continue to maintain adequate hydration -Follow renal function trend.  7-mild transaminitis -AST 113; most likely in the setting of acute COVID infection -Holding statins currently. -Follow LFTs.  8-history of migraines -Continue as needed Fioricet and rizatriptan.  9-history of COPD -Currently no wheezing -Continue home inhaler regimen. -Steroids treatment as mentioned above, assisting with condition stability.  10-type 2 diabetes with hyperglycemia -No prior history of diabetes or using hypoglycemic agents prior to admission -Steroids therapy causing hyperglycemia -Continue sliding scale insulin and adjusted dose of Levemir -Continue to follow CBGs and adjust hypoglycemic regimen as required. -At discharge patient will benefit of initiation of Metformin.   DVT prophylaxis:Lovenox Code Status: Full code. Family Communication: Patient's boyfriend/significant other  Disposition:   Status is: Inpatient  Dispo: The patient is from: Home              Anticipated d/c is to: Home              Anticipated d/c date is: To be determined              Patient currently no medically stable for discharge; still requiring significant amount of oxygen supplementation (15 L high flow nasal cannula along with nonrebreather), continue treatment with IV steroids, remdesivir (last dose) and baricitinib.  Using BiPAP  nightly.   Difficult to  place patient: No     Consultants:   None   Procedures:  See below for x-ray reports.  Antimicrobials/antiviral Remdesivir 5 out of 5  Subjective:  -Dyspnea and hypoxia persist, Not tolerating BiPAP well at night No fever  Or chills   Objective: Vitals:   04/05/20 1200 04/05/20 1233 04/05/20 1300 04/05/20 1400  BP: (!) 98/46   131/65  Pulse: 70  65 63  Resp: 20  16 (!) 22  Temp:  97.9 F (36.6 C)    TempSrc:  Axillary    SpO2: 94%  96% (!) 86%  Weight:      Height:       No intake or output data in the 24 hours ending 04/05/20 1449 Filed Weights   03/31/20 0038 04/01/20 0503 04/05/20 0410  Weight: 123.3 kg 127.9 kg 117.7 kg    Examination:  Physical Exam  Gen:- Awake Alert, conversational dyspnea HEENT:- [place].AT, No sclera icterus Nose:- [place] at 15 L plus NRB  Neck-Supple Neck,No JVD,.  Lungs-fair air movement, no wheezing CV- S1, S2 normal Abd-  +ve B.Sounds, Abd Soft, No tenderness,    Extremity/Skin:- No  edema,   good pulses Psych-affect is anxious, oriented x3 Neuro-no new focal deficits, no tremors    Data Reviewed: I have personally reviewed following labs and imaging studies  CBC: Recent Labs  Lab 03/31/20 0227 04/01/20 0217 04/02/20 0532 04/03/20 0620 04/04/20 0520  WBC 1.5* 2.2* 3.2* 3.6* 4.3  NEUTROABS 1.0* 1.7 2.6 3.0 3.5  HGB 12.1 12.1 11.8* 11.8* 12.1  HCT 38.9 40.5 38.9 38.7 37.8  MCV 90.9 92.5 90.9 91.1 88.1  PLT 92* 103* 123* 125* 124*    Basic Metabolic Panel: Recent Labs  Lab 03/31/20 0227 04/01/20 0217 04/02/20 0532 04/03/20 0620 04/04/20 0520  NA 139 140 137 137 138  K 4.2 3.9 3.4* 3.6 3.5  CL 105 107 107 109 109  CO2 26 24 21* 20* 22  GLUCOSE 252* 236* 260* 295* 234*  BUN 15 23* 27* 28* 32*  CREATININE 1.19* 1.34* 1.06* 1.11* 1.02*  CALCIUM 8.7* 8.9 8.6* 8.4* 8.5*    GFR: Estimated Creatinine Clearance: 92.4 mL/min (A) (by C-G formula based on SCr of 1.02 mg/dL (H)).  Liver Function Tests: Recent Labs   Lab 03/31/20 0227 04/01/20 0217 04/02/20 0532 04/03/20 0620 04/04/20 0520  AST 117* 111* 68* 108* 47*  ALT 55* 52* 42 73* 55*  ALKPHOS 69 72 70 81 75  BILITOT 0.6 0.6 0.5 0.7 0.8  PROT 7.0 6.6 6.4* 6.2* 5.8*  ALBUMIN 3.2* 3.0* 3.0* 2.9* 2.9*    CBG: Recent Labs  Lab 04/04/20 1211 04/04/20 1635 04/04/20 2007 04/05/20 0817 04/05/20 1215  GLUCAP 248* 239* 257* 282* 297*     Recent Results (from the past 240 hour(s))  SARS Coronavirus 2 by RT PCR (hospital order, performed in [place] hospital lab) Nasopharyngeal Nasopharyngeal Swab     Status: Abnormal   Collection Time: 04/14/2020  1:55 PM   Specimen: Nasopharyngeal Swab  Result Value Ref Range Status   SARS Coronavirus 2 POSITIVE (A) NEGATIVE Final    Comment: RESULT CALLED TO, READ BACK BY AND VERIFIED WITH: MD ON  [id number] @ 1530 BY HENDERSON L. (NOTE) SARS-CoV-2 target nucleic acids are DETECTED  SARS-CoV-2 RNA is generally detectable in upper respiratory specimens  during the acute phase of infection.  Positive results are indicative  of the presence of the identified virus, but do not rule  out bacterial infection or co-infection with other pathogens not detected by the test.  Clinical correlation with patient history and  other diagnostic information is necessary to determine patient infection status.  The expected result is negative.  Fact Sheet for Patients:   StrictlyIdeas.no   Fact Sheet for Healthcare Providers:   BankingDealers.co.za    This test is not yet approved or cleared by the [place] FDA and  has been authorized for detection and/or diagnosis of SARS-CoV-2 by FDA under an Emergency Use Authorization (EUA).  This EUA will remain in effect (meaning this  test can be used) for the duration of  the COVID-19 declaration under Section 564(b)(1) of the Act, 21 U.S.C. section 360-bbb-3(b)(1), unless the authorization is terminated or revoked  sooner.  Performed at [facility], [address], [place], [place] [address]   Blood Culture (routine x 2)     Status: None   Collection Time: 04/22/2020  1:55 PM   Specimen: BLOOD  Result Value Ref Range Status   Specimen Description BLOOD BLOOD RIGHT HAND  Final   Special Requests   Final    Blood Culture adequate volume BOTTLES DRAWN AEROBIC AND ANAEROBIC   Culture   Final    NO GROWTH 5 DAYS Performed at [facility], [address] [place], [place] [address]    Report Status 04/04/2020 FINAL  Final  Blood Culture (routine x 2)     Status: None   Collection Time: 04/10/2020  2:00 PM   Specimen: BLOOD  Result Value Ref Range Status   Specimen Description BLOOD RIGHT ANTECUBITAL  Final   Special Requests   Final    Blood Culture adequate volume BOTTLES DRAWN AEROBIC AND ANAEROBIC   Culture   Final    NO GROWTH 5 DAYS Performed at [facility], [address] [place], [place] [address]    Report Status 04/04/2020 FINAL  Final  MRSA PCR Screening     Status: None   Collection Time: 03/31/20 12:36 AM   Specimen: Nasal Mucosa; Nasopharyngeal  Result Value Ref Range Status   MRSA by PCR NEGATIVE NEGATIVE Final    Comment:        The GeneXpert MRSA Assay (FDA approved for NASAL specimens only), is one component of a comprehensive MRSA colonization surveillance program. It is not intended to diagnose MRSA infection nor to guide or monitor treatment for MRSA infections. Performed at [facility], [address] [place], [place] [address]     Radiology Studies: No results found. Scheduled Meds: . acetaZOLAMIDE  250 mg Oral BID  . acyclovir  200 mg Oral Daily  . vitamin C  500 mg Oral Daily  . azelastine  2 spray Each Nare Daily  . baricitinib  4 mg Oral Daily  . cholecalciferol  25 mcg Oral Daily  . enoxaparin (LOVENOX) injection  60 mg Subcutaneous Q24H  . feeding supplement  237 mL Oral BID BM  . FLUoxetine  40 mg Oral Daily  . fluticasone furoate-vilanterol  1 puff  Inhalation Daily  . insulin aspart  0-15 Units Subcutaneous TID WC  . insulin aspart  0-5 Units Subcutaneous QHS  . insulin detemir  12 Units Subcutaneous BID  . loratadine  10 mg Oral Daily  . methylPREDNISolone (SOLU-MEDROL) injection  60 mg Intravenous Q8H  . nystatin  5 mL Oral QID  . pantoprazole  40 mg Oral BID  . pregabalin  150 mg Oral QHS  . saccharomyces boulardii  250 mg Oral BID  .  zinc sulfate  220 mg Oral Daily   Continuous Infusions:    LOS: 6 days    Time spent: 35 minutes    Roxan Hockey, MD [facility]   To contact the attending provider between 7A-7P or the covering provider during after hours 7P-7A, please log into the web site [email or web address] and access using universal [place] password for that web site. If you do not have the password, please call the hospital operator.  04/05/2020, 2:49 PM

## 2020-04-05 NOTE — Progress Notes (Signed)
Patient refused BIPAP for the night. Said she would rather wear what she has on currently. She is on 15 lpm salter HFNC with a NRB mask and satting 91%. Discussed with patient that if her levels dropped too low we may have to wear the BIPAP for a little while. She seemed agreeable. Will continue to monitor.

## 2020-04-05 NOTE — Progress Notes (Signed)
RN informed RT that patient was begging to be taken off BIPAP after only a few hours on machine stating she could not breath with it on. Patient was placed back on 15 LPM HFNC with NRB and is currently satting 90%.

## 2020-04-06 DIAGNOSIS — G43809 Other migraine, not intractable, without status migrainosus: Secondary | ICD-10-CM | POA: Diagnosis not present

## 2020-04-06 DIAGNOSIS — N179 Acute kidney failure, unspecified: Secondary | ICD-10-CM | POA: Diagnosis not present

## 2020-04-06 DIAGNOSIS — U071 COVID-19: Secondary | ICD-10-CM | POA: Diagnosis not present

## 2020-04-06 LAB — COMPREHENSIVE METABOLIC PANEL
ALT: 41 U/L (ref 0–44)
AST: 72 U/L — ABNORMAL HIGH (ref 15–41)
Albumin: 2.8 g/dL — ABNORMAL LOW (ref 3.5–5.0)
Alkaline Phosphatase: 77 U/L (ref 38–126)
Anion gap: 8 (ref 5–15)
BUN: 31 mg/dL — ABNORMAL HIGH (ref 6–20)
CO2: 19 mmol/L — ABNORMAL LOW (ref 22–32)
Calcium: 8.2 mg/dL — ABNORMAL LOW (ref 8.9–10.3)
Chloride: 108 mmol/L (ref 98–111)
Creatinine, Ser: 1.09 mg/dL — ABNORMAL HIGH (ref 0.44–1.00)
GFR, Estimated: >60 mL/min (ref >60)
Glucose, Bld: 173 mg/dL — ABNORMAL HIGH (ref 70–99)
Potassium: 4 mmol/L (ref 3.5–5.1)
Sodium: 135 mmol/L (ref 135–145)
Total Bilirubin: 0.6 mg/dL (ref 0.3–1.2)
Total Protein: 5.9 g/dL — ABNORMAL LOW (ref 6.5–8.1)

## 2020-04-06 LAB — CBC
HCT: 36.2 % (ref 36.0–46.0)
Hemoglobin: 11.7 g/dL — ABNORMAL LOW (ref 12.0–15.0)
MCH: 28.1 pg (ref 26.0–34.0)
MCHC: 32.3 g/dL (ref 30.0–36.0)
MCV: 86.8 fL (ref 80.0–100.0)
Platelets: 138 10*3/uL — ABNORMAL LOW (ref 150–400)
RBC: 4.17 MIL/uL (ref 3.87–5.11)
RDW: 13.7 % (ref 11.5–15.5)
WBC: 6.7 10*3/uL (ref 4.0–10.5)
nRBC: 0 % (ref 0.0–0.2)

## 2020-04-06 LAB — GLUCOSE, CAPILLARY
Glucose-Capillary: 292 mg/dL — ABNORMAL HIGH (ref 70–99)
Glucose-Capillary: 347 mg/dL — ABNORMAL HIGH (ref 70–99)
Glucose-Capillary: 229 mg/dL — ABNORMAL HIGH (ref 70–99)

## 2020-04-06 MED ORDER — METHOCARBAMOL 500 MG PO TABS
500.0000 mg | ORAL_TABLET | Freq: Three times a day (TID) | ORAL | Status: DC
  Administered 2020-04-06 – 2020-04-14 (×17): 500 mg via ORAL
  Filled 2020-04-06 (×18): qty 1

## 2020-04-06 MED ORDER — SODIUM CHLORIDE 0.9 % IV SOLN: INTRAVENOUS | Status: DC

## 2020-04-06 MED ORDER — SODIUM CHLORIDE 0.9 % IV BOLUS
500.0000 mL | Freq: Once | INTRAVENOUS | Status: AC
  Administered 2020-04-06: 500 mL via INTRAVENOUS

## 2020-04-06 MED ORDER — PROSOURCE PLUS PO LIQD
30.0000 mL | Freq: Three times a day (TID) | ORAL | Status: DC
  Administered 2020-04-07 – 2020-04-12 (×12): 30 mL via ORAL
  Filled 2020-04-06 (×14): qty 30

## 2020-04-06 MED ORDER — NEPRO/CARBSTEADY PO LIQD
237.0000 mL | Freq: Three times a day (TID) | ORAL | Status: DC
  Administered 2020-04-08 – 2020-04-11 (×3): 237 mL via ORAL

## 2020-04-06 MED ORDER — INSULIN DETEMIR 100 UNIT/ML ~~LOC~~ SOLN
14.0000 [IU] | Freq: Two times a day (BID) | SUBCUTANEOUS | Status: DC
  Administered 2020-04-06 – 2020-04-14 (×12): 14 [IU] via SUBCUTANEOUS
  Filled 2020-04-06 (×21): qty 0.14

## 2020-04-06 NOTE — Progress Notes (Signed)
Nutrition Follow-up  DOCUMENTATION CODES:   Morbid obesity  INTERVENTION:  Ensure Enlive po BID, each supplement provides 350 kcal and 20 grams of protein  Nepro Shake po TID, each supplement provides 425 kcal and 19 grams protein  ProSource Plus 30 mg po TID, each supplement provides 100 kcal and 15 grams of protein  Consider scheduled bowel regimen  If poor acceptance of supplement regimen, consider placement of NG tube and initiating - Vital 1.2 @ 30 ml/hr, advance 10 ml every 6 hours to goal rate of 70 ml/hr (1680 ml/day) -Prosource TF 45 ml TID via tube  Regimen at goal rate provides 2136 kcal, 159 grams protein, and 1361 ml free water  NUTRITION DIAGNOSIS:   Increased nutrient needs related to catabolic illness (MNOTR-71 infection) as evidenced by estimated needs. -ongoing  GOAL:   Patient will meet greater than or equal to 90% of their needs -unmet  MONITOR:   Labs,I & O's,Skin,Supplement acceptance,PO intake,Weight trends  REASON FOR ASSESSMENT:   Malnutrition Screening Tool    ASSESSMENT:   45 year old female admitted with acute respiratory failure with hypoxia secondary to COVID-19 pneumonia. Past medical history of GERD, vertigo, depression, anxiety, pseudomotor cerebri, benign intracranial hypertension with VP shunt presented with worsening SOB, cough, fatigue, and body aches since testing positive for Covid 7 days ago with home kit.  Pt with ongoing high oxygen requirements, on 15 L plus NRB to keep sats > 90% today. Appetite and intake remains poor, eating 0-50% (12% average of the last 8 documented meals from 2/6-2/9). Pt is drinking Ensure supplements ~50% of the time and has accepted 2 of 2 Ensure offered today. Given catabolic illness, as well as very poor oral intake of nutrition in the last 7 days and weight trends, concerned for acute malnutrition. Will add Nepro supplement three times daily as well as ProSource Plus three times daily to help her meet  her needs. If pt is accepting of all supplements, this will provide 2275 kcal and 142 grams of protein (meeting 100% of calorie and protein needs) Given ongoing high oxygen requirements, poor meal intake likely to persist. If pt has poor intake of supplement regimen, recommend placement of NG tube for nutrition support.   Weight 117 kg (257.4 lbs) today down 5.5 kg (12 lbs) from 122.5 kg on 2/4; significant 4.5%  Last documented bowel movement on 2/7, consider scheduled bowel regimen.  I/Os: +3140 ml since admit UOP: 500 ml x 24 hrs  Medications reviewed and include: Acyclovir, Vit C, D3, Prozac, Levemir 14 units 2 times daily, SSI, Robaxin, Methylprednisolone, Protonix, Lyrica, Florastor, Zinc sulfate  IVF: NaCl @ 100 ml/hr  Labs: CBGs [phone], BUN 31 (H), Cr 1.09 (H)  Diet Order:   Diet Order            Diet heart healthy/carb modified Room service appropriate? Yes; Fluid consistency: Thin  Diet effective now                 EDUCATION NEEDS:   Not appropriate for education at this time  Skin:  Skin Assessment: Reviewed RN Assessment  Last BM:  2/7  Height:   Ht Readings from Last 1 Encounters:  04/02/20 _0  (1.702 m)    Weight:   Wt Readings from Last 1 Encounters:  04/06/20 117 kg    BMI:  Body mass index is 40.4 kg/m.  Estimated Nutritional Needs:   Kcal:  3291-9166  Protein:  140-154  Fluid:  >2.2 L  Lajuan Lines, RD, LDN Clinical Nutrition After Hours/Weekend Pager # in [place]

## 2020-04-06 NOTE — Progress Notes (Signed)
PROGRESS NOTE    Sandra [name]  GYK:[id number] DOB: Aug 16, 1975 DOA: 03/28/2020 PCP: Glenda Chroman, MD   Chief Complaint  Patient presents with  . Shortness of Breath    Brief admission Narrative:  Sandra [name]  is a 45 y.o. female, nickel history of GERD,vertigo, depression, anxiety, pseudotumor cerebri, benign intracranial hypertension, with VP shunt, patient is unvaccinated against Covid, she presents with shortness of breath, cough, patient diagnosed with COVID-19 7 days ago with a home kit, she reports cough, shortness of breath, fatigue and body ache, but her symptoms continue to worsen, report dyspnea has worsened over the last 2 to 3 days, as well she reports chest pain related to deep breathing, and coughing, prompted her to come to ED. - in ED patient saturation 73% on room air, she is currently on 15 L high flow nasal cannula, CTA chest negative for PE, but significant for severe COVID-19  pneumonia, creatinine elevated at 1.5, AST elevated at 113, [facility] hospitalist consulted to admit.  Assessment & Plan: 1-acute respiratory failure with hypoxia secondary to COVID-19 pneumonia --Continue treatment with steroids, remdesivir and baricitinib. -Patient has been encourage to participate with proning position, incentive spirometer and flutter valve. -Continue as needed antitussive medications, vitamin C and zinc. -did not tolerate  BiPAP well overnight (Claustrophobic)--   Continue also to use Robaxin and prn oxycodone as needed. Today requiring [place] at 15 L plus NRB to keep sats > 90 % -desats to 70/80's with activity with oxygen on and 50's when she does not have any o2 on.  --Significant cough, dyspnea and hypoxia persist  2-Morbid obesity (HCC) -Body mass index is 40.4 kg/m. -Low calorie diet, portion control increase physical activity discussed with patient. -With concerns for obesity hypoventilation syndrome, sleep apnea; patient significant other reporting episodes of apnea  in the middle of the night and snoring prior to admission or being sick. --Bipap qhs advised---pt states makes her claustrophobic   3-Depression/Anxiety -Starting Xanax (alert social dysfunction and family sickness actively affecting patient) -Continue Prozac, continue Xanax as needed -No suicidal ideation or hallucinations currently.  4-GERD (gastroesophageal reflux disease) -Continue PPI.  5-Pseudotumor cerebri -Continue Diamox -Patient denies headaches currently.  6-acute kidney injury -In the setting of prerenal azotemia, dehydration and transient hypotension -Hold HCTZ and Lasix --- renally adjust medications, avoid nephrotoxic agents / dehydration  / hypotension -Creatinine is down to 1.0 from a high of 1.53 -Follow renal function trend.  7-mild transaminitis ; most likely in the setting of acute COVID infection -Holding statins currently. -AST is down to 72 from a high of 117 -ALT is down to 41 from a high of 53 -Alk phos and T bili WNL -Follow LFTs.  8-history of migraines -Continue as needed Fioricet and rizatriptan.  9-history of COPD -Currently no wheezing -Continue home inhaler regimen. -Steroids treatment as mentioned above, assisting with condition stability.  10-type 2 diabetes with hyperglycemia -No prior history of diabetes or using hypoglycemic agents prior to admission -Steroids therapy causing hyperglycemia -Continue sliding scale insulin and adjusted dose of Levemir -Continue to follow CBGs and adjust hypoglycemic regimen as required. -At discharge patient will benefit of initiation of Metformin.   DVT prophylaxis:Lovenox Code Status: Full code. Family Communication: Patient's boyfriend/significant other  Disposition:   Status is: Inpatient  Dispo: The patient is from: Home              Anticipated d/c is to: Home  Anticipated d/c date is: To be determined              Patient currently no medically stable for discharge; still  requiring significant amount of oxygen supplementation (15 L high flow nasal cannula along with nonrebreather), continue treatment with IV steroids, remdesivir (last dose) and baricitinib.  Using BiPAP nightly.   Difficult to place patient: No     Consultants:   None   Procedures:  See below for x-ray reports.  Antimicrobials/antiviral Remdesivir 5 out of 5  Subjective:  Significant coughing spells, hypoxia and dyspnea persist - patient desats to 70/80's with activity with oxygen and 50's when she does not have any o2 on.   Objective: Vitals:   04/06/20 0300 04/06/20 0400 04/06/20 0432 04/06/20 0958  BP:  (!) 104/59    Pulse: 61 (!) 57    Resp: (!) 22 (!) 22    Temp:   98 F (36.7 C)   TempSrc:   Axillary   SpO2: (!) 89% (!) 87%  98%  Weight:   117 kg   Height:       No intake or output data in the 24 hours ending 04/06/20 1211 Filed Weights   04/01/20 0503 04/05/20 0410 04/06/20 0432  Weight: 127.9 kg 117.7 kg 117 kg    Examination:  Physical Exam Gen:- Awake Alert, conversational dyspnea HEENT:- [place].AT, No sclera icterus Nose:- [place] at 15 L plus NRB  Neck-Supple Neck,No JVD,.  Lungs-fair air movement, no wheezing CV- S1, S2 normal Abd-  +ve B.Sounds, Abd Soft, No tenderness, increased truncal adiposity    Extremity/Skin:- No  edema,   good pulses Psych-affect is anxious, oriented x3 Neuro-no new focal deficits, no tremors    Data Reviewed: I have personally reviewed following labs and imaging studies  CBC: Recent Labs  Lab 03/31/20 0227 04/01/20 0217 04/02/20 0532 04/03/20 0620 04/04/20 0520 04/06/20 0447  WBC 1.5* 2.2* 3.2* 3.6* 4.3 6.7  NEUTROABS 1.0* 1.7 2.6 3.0 3.5  --   HGB 12.1 12.1 11.8* 11.8* 12.1 11.7*  HCT 38.9 40.5 38.9 38.7 37.8 36.2  MCV 90.9 92.5 90.9 91.1 88.1 86.8  PLT 92* 103* 123* 125* 124* 138*    Basic Metabolic Panel: Recent Labs  Lab 04/01/20 0217 04/02/20 0532 04/03/20 0620 04/04/20 0520 04/06/20 0447  NA 140 137  137 138 135  K 3.9 3.4* 3.6 3.5 4.0  CL 107 107 109 109 108  CO2 24 21* 20* 22 19*  GLUCOSE 236* 260* 295* 234* 173*  BUN 23* 27* 28* 32* 31*  CREATININE 1.34* 1.06* 1.11* 1.02* 1.09*  CALCIUM 8.9 8.6* 8.4* 8.5* 8.2*    GFR: Estimated Creatinine Clearance: 86.2 mL/min (A) (by C-G formula based on SCr of 1.09 mg/dL (H)).  Liver Function Tests: Recent Labs  Lab 04/01/20 0217 04/02/20 0532 04/03/20 0620 04/04/20 0520 04/06/20 0447  AST 111* 68* 108* 47* 72*  ALT 52* 42 73* 55* 41  ALKPHOS 72 70 81 75 77  BILITOT 0.6 0.5 0.7 0.8 0.6  PROT 6.6 6.4* 6.2* 5.8* 5.9*  ALBUMIN 3.0* 3.0* 2.9* 2.9* 2.8*    CBG: Recent Labs  Lab 04/05/20 0817 04/05/20 1215 04/05/20 1628 04/05/20 1957 04/06/20 1050  GLUCAP 282* 297* 195* 143* 292*     Recent Results (from the past 240 hour(s))  SARS Coronavirus 2 by RT PCR (hospital order, performed in [facility] hospital lab) Nasopharyngeal Nasopharyngeal Swab     Status: Abnormal   Collection Time: 04/09/2020  1:55 PM  Specimen: Nasopharyngeal Swab  Result Value Ref Range Status   SARS Coronavirus 2 POSITIVE (A) NEGATIVE Final    Comment: RESULT CALLED TO, READ BACK BY AND VERIFIED WITH: MD ON  [id number] @ 1530 BY HENDERSON L. (NOTE) SARS-CoV-2 target nucleic acids are DETECTED  SARS-CoV-2 RNA is generally detectable in upper respiratory specimens  during the acute phase of infection.  Positive results are indicative  of the presence of the identified virus, but do not rule out bacterial infection or co-infection with other pathogens not detected by the test.  Clinical correlation with patient history and  other diagnostic information is necessary to determine patient infection status.  The expected result is negative.  Fact Sheet for Patients:   StrictlyIdeas.no   Fact Sheet for Healthcare Providers:   BankingDealers.co.za    This test is not yet approved or cleared by the [place]  FDA and  has been authorized for detection and/or diagnosis of SARS-CoV-2 by FDA under an Emergency Use Authorization (EUA).  This EUA will remain in effect (meaning this  test can be used) for the duration of  the COVID-19 declaration under Section 564(b)(1) of the Act, 21 U.S.C. section 360-bbb-3(b)(1), unless the authorization is terminated or revoked sooner.  Performed at [facility], [address] [place], [place] [address]   Blood Culture (routine x 2)     Status: None   Collection Time: 03/31/2020  1:55 PM   Specimen: Blood  Result Value Ref Range Status   Specimen Description BLOOD BLOOD RIGHT HAND  Final   Special Requests   Final    Blood Culture adequate volume BOTTLES DRAWN AEROBIC AND ANAEROBIC   Culture   Final    NO GROWTH 5 DAYS Performed at [facility] [address] [place], [place] [address]    Report Status 04/04/2020 FINAL  Final  Blood Culture (routine x 2)     Status: None   Collection Time: 04/03/2020  2:00 PM   Specimen: Blood  Result Value Ref Range Status   Specimen Description BLOOD RIGHT ANTECUBITAL  Final   Special Requests   Final    Blood Culture adequate volume BOTTLES DRAWN AEROBIC AND ANAEROBIC   Culture   Final    NO GROWTH 5 DAYS Performed at [facility], [address], [place], [place] [address]    Report Status 04/04/2020 FINAL  Final  MRSA PCR Screening     Status: None   Collection Time: 03/31/20 12:36 AM   Specimen: Nasal Mucosa; Nasopharyngeal  Result Value Ref Range Status   MRSA by PCR NEGATIVE NEGATIVE Final    Comment:        The GeneXpert MRSA Assay (FDA approved for NASAL specimens only), is one component of a comprehensive MRSA colonization surveillance program. It is not intended to diagnose MRSA infection nor to guide or monitor treatment for MRSA infections. Performed at [facility], [address] [place], [place] [address]     Radiology Studies: No results found. Scheduled Meds: . acetaZOLAMIDE  250 mg  Oral BID  . acyclovir  200 mg Oral Daily  . vitamin C  500 mg Oral Daily  . azelastine  2 spray Each Nare Daily  . baricitinib  4 mg Oral Daily  . cholecalciferol  25 mcg Oral Daily  . enoxaparin (LOVENOX) injection  60 mg Subcutaneous Q24H  . feeding supplement  237 mL Oral BID BM  . FLUoxetine  40 mg Oral Daily  . fluticasone furoate-vilanterol  1 puff  Inhalation Daily  . insulin aspart  0-15 Units Subcutaneous TID WC  . insulin aspart  0-5 Units Subcutaneous QHS  . insulin detemir  14 Units Subcutaneous BID  . loratadine  10 mg Oral Daily  . methocarbamol  500 mg Oral TID  . methylPREDNISolone (SOLU-MEDROL) injection  60 mg Intravenous Q8H  . nystatin  5 mL Oral QID  . pantoprazole  40 mg Oral BID  . pregabalin  150 mg Oral QHS  . saccharomyces boulardii  250 mg Oral BID  . zinc sulfate  220 mg Oral Daily   Continuous Infusions:    LOS: 7 days    Roxan Hockey, MD [facility]   To contact the attending provider between 7A-7P or the covering provider during after hours 7P-7A, please log into the web site [email or web address] and access using universal [place] password for that web site. If you do not have the password, please call the hospital operator.  04/06/2020, 12:11 PM

## 2020-04-06 NOTE — Progress Notes (Signed)
Started patient on HHFNC 35L @ 100% with NRB mask due to sats maintaining in the low 80s. Patient's sat will increase with cough.

## 2020-04-06 NOTE — Progress Notes (Signed)
Patient removing HFNC and non-rebreather to drink. She states she is having a very irritating cough that is triggering spasms. MD notified. Nasal saline spray ordered. Patient agreed to get up to chair for meals. Patient does desat to 70/80's with activity and 50's when she does not have any o2 on. Teaching on importance of keeping spo2 >90 with verbalization of teaching noted. All needs met at this time. Call light placed in reach.

## 2020-04-07 ENCOUNTER — Inpatient Hospital Stay (HOSPITAL_COMMUNITY): Payer: Medicare Other

## 2020-04-07 DIAGNOSIS — J1282 Pneumonia due to coronavirus disease 2019: Secondary | ICD-10-CM

## 2020-04-07 DIAGNOSIS — U071 COVID-19: Secondary | ICD-10-CM | POA: Diagnosis present

## 2020-04-07 DIAGNOSIS — J9601 Acute respiratory failure with hypoxia: Secondary | ICD-10-CM | POA: Diagnosis present

## 2020-04-07 DIAGNOSIS — R9431 Abnormal electrocardiogram [ECG] [EKG]: Secondary | ICD-10-CM | POA: Diagnosis not present

## 2020-04-07 DIAGNOSIS — D696 Thrombocytopenia, unspecified: Secondary | ICD-10-CM | POA: Diagnosis present

## 2020-04-07 LAB — ECHOCARDIOGRAM LIMITED
AV Mean grad: 11 mmHg
AV Peak grad: 20 mmHg
Ao pk vel: 2.24 m/s
Area-P 1/2: 5.38 cm2
Height: 67 in
S' Lateral: 2.76 cm
Weight: 4127.01 oz

## 2020-04-07 LAB — CBC
HCT: 33.3 % — ABNORMAL LOW (ref 36.0–46.0)
Hemoglobin: 10.7 g/dL — ABNORMAL LOW (ref 12.0–15.0)
MCH: 28.8 pg (ref 26.0–34.0)
MCHC: 32.1 g/dL (ref 30.0–36.0)
MCV: 89.5 fL (ref 80.0–100.0)
Platelets: 88 10*3/uL — ABNORMAL LOW (ref 150–400)
RBC: 3.72 MIL/uL — ABNORMAL LOW (ref 3.87–5.11)
RDW: 14.2 % (ref 11.5–15.5)
WBC: 4.9 10*3/uL (ref 4.0–10.5)
nRBC: 0 % (ref 0.0–0.2)

## 2020-04-07 LAB — COMPREHENSIVE METABOLIC PANEL
ALT: 37 U/L (ref 0–44)
AST: 91 U/L — ABNORMAL HIGH (ref 15–41)
Albumin: 2.4 g/dL — ABNORMAL LOW (ref 3.5–5.0)
Alkaline Phosphatase: 69 U/L (ref 38–126)
Anion gap: 7 (ref 5–15)
BUN: 24 mg/dL — ABNORMAL HIGH (ref 6–20)
CO2: 21 mmol/L — ABNORMAL LOW (ref 22–32)
Calcium: 7.9 mg/dL — ABNORMAL LOW (ref 8.9–10.3)
Chloride: 109 mmol/L (ref 98–111)
Creatinine, Ser: 1.03 mg/dL — ABNORMAL HIGH (ref 0.44–1.00)
GFR, Estimated: >60 mL/min (ref >60)
Glucose, Bld: 109 mg/dL — ABNORMAL HIGH (ref 70–99)
Potassium: 2.9 mmol/L — ABNORMAL LOW (ref 3.5–5.1)
Sodium: 137 mmol/L (ref 135–145)
Total Bilirubin: 0.7 mg/dL (ref 0.3–1.2)
Total Protein: 5.1 g/dL — ABNORMAL LOW (ref 6.5–8.1)

## 2020-04-07 LAB — GLUCOSE, CAPILLARY
Glucose-Capillary: 125 mg/dL — ABNORMAL HIGH (ref 70–99)
Glucose-Capillary: 91 mg/dL (ref 70–99)
Glucose-Capillary: 178 mg/dL — ABNORMAL HIGH (ref 70–99)
Glucose-Capillary: 229 mg/dL — ABNORMAL HIGH (ref 70–99)
Glucose-Capillary: 245 mg/dL — ABNORMAL HIGH (ref 70–99)

## 2020-04-07 LAB — MAGNESIUM: Magnesium: 2.1 mg/dL (ref 1.7–2.4)

## 2020-04-07 MED ORDER — MAGNESIUM SULFATE 2 GM/50ML IV SOLN
2.0000 g | Freq: Once | INTRAVENOUS | Status: AC
  Administered 2020-04-07: 2 g via INTRAVENOUS
  Filled 2020-04-07: qty 50

## 2020-04-07 MED ORDER — HYDROCOD POLST-CPM POLST ER 10-8 MG/5ML PO SUER: 5.0000 mL | ORAL | Status: DC | PRN

## 2020-04-07 MED ORDER — LEVALBUTEROL HCL 0.63 MG/3ML IN NEBU
INHALATION_SOLUTION | RESPIRATORY_TRACT | Status: AC
  Administered 2020-04-07: 0.63 mg
  Filled 2020-04-07: qty 3

## 2020-04-07 MED ORDER — SODIUM CHLORIDE 0.9 % IV BOLUS
1000.0000 mL | Freq: Once | INTRAVENOUS | Status: AC
  Administered 2020-04-07: 1000 mL via INTRAVENOUS

## 2020-04-07 MED ORDER — HYDROCOD POLST-CPM POLST ER 10-8 MG/5ML PO SUER
5.0000 mL | ORAL | Status: DC | PRN
  Administered 2020-04-07 – 2020-04-13 (×22): 5 mL via ORAL
  Filled 2020-04-07 (×23): qty 5

## 2020-04-07 MED ORDER — POTASSIUM CHLORIDE 10 MEQ/100ML IV SOLN
10.0000 meq | INTRAVENOUS | Status: AC
  Administered 2020-04-07 (×4): 10 meq via INTRAVENOUS
  Filled 2020-04-07 (×4): qty 100

## 2020-04-07 MED ORDER — ENOXAPARIN SODIUM 60 MG/0.6ML ~~LOC~~ SOLN
60.0000 mg | Freq: Two times a day (BID) | SUBCUTANEOUS | Status: DC
  Administered 2020-04-08: 60 mg via SUBCUTANEOUS
  Filled 2020-04-07: qty 0.6

## 2020-04-07 MED ORDER — ENOXAPARIN SODIUM 120 MG/0.8ML ~~LOC~~ SOLN
120.0000 mg | Freq: Two times a day (BID) | SUBCUTANEOUS | Status: DC
  Administered 2020-04-07: 120 mg via SUBCUTANEOUS
  Filled 2020-04-07: qty 0.8

## 2020-04-07 NOTE — Progress Notes (Signed)
Patient sitting up in bed at this time. Biggest complaint now is her persistent cough. Order for additional cough medicine obtained by provider.  Patient's daughter was at bedside most of the morning, assisting with cares and helping with therapeutic communication and distraction, which proved very helpful. Patient is calm, cooperative and eating now. Her daughter brought her food, encouraging increased PO intake and fluid intake.  Blood pressures continue to be soft, systolics 90's with maps of 60-65. Patient does not complain of headache or lightheadedness. She is receiving IV potassium peripherally with no c/o discomfort at this time to her extremity.  Good urine output noted since placement of foley catheter this afternoon.  Foley paced for accurate I/O in critically ill patient.

## 2020-04-07 NOTE — Progress Notes (Signed)
  Echocardiogram 2D Echocardiogram has been performed.  Sandra [name] 04/07/2020, 10:55 AM

## 2020-04-07 NOTE — Progress Notes (Signed)
Around 8:20 this am, patient became hypotensive again, with blood pressures 70/30. Manual cuff pressure taken and confirmed. Provider Emokpae was notified and an order for 1L NS bolus was given, as well as labs ordered. Patient was responsive to fluid bolus, with an improvement of BP to 110's systolic, with maps of 65 or greater. While labs were being drawn, patient started to shake and state she felt lightheaded. Noted HR increased to 135. EKG completed with showed tachycardia sinus rhythm, with t-wave abnormalities. MD notified. Patient becoming more lethargic, extremities cool to touch, pulses remain 2+ in upper and lower extremities. No warmth, redness, or swelling noted in bilateral upper or lower extremities.

## 2020-04-07 NOTE — Progress Notes (Signed)
PROGRESS NOTE    Sandra [name]  SVX:[id number] DOB: 06/03/1975 DOA: 04/11/2020 PCP: Glenda Chroman, MD   Chief Complaint  Patient presents with  . Shortness of Breath    Brief admission Narrative:  Sandra [name]  is a 45 y.o. female, nickel history of GERD,vertigo, depression, anxiety, pseudotumor cerebri, benign intracranial hypertension, with VP shunt, patient is unvaccinated against Covid, admitted on 04/17/2020 with acute hypoxic respiratory failure with O2 sats down to 73% on room air on admission---and found to have Covid 19 respiratory infection/pneumonia-  CTA chest negative for PE on admission--- and she required high flow nasal cannula.  Assessment & Plan:  1) Acute Respiratory Failure with Hypoxia secondary to COVID-19 Pneumonia --Continue Treatment with Steroids and Baricitinib. -Patient has been encourage to participate with proning position, incentive spirometer and flutter valve. -Completed Remdesivir already -Continue as needed antitussive medications, vitamin C and zinc. -did not tolerate  BiPAP well  (Claustrophobic)--   Continue also to use Robaxin and prn oxycodone as needed.  04/07/20 -Continue to have profound hypoxia, desaturates very quickly with positional change despite being on  [place] at 15 L plus NRB  -desats to 70/80's with activity with oxygen on and 50's when she does not have any o2 on.  -Had episodes of transient hypotension and tachycardia -required IV fluid boluses CXR on 04/07/20-- w/o New acute findings  2)Episodes of Transient Tachycardia and Transient Hypotension--- patient has longstanding history of POTS--- -Gave IVF bolus - c/n IVF  -LE Venous Doppler w/o acute findings Echo pending-- --Received Lovenox 120 mg sq x 1 --will change to Lovenox 60 mg q 12 hr --watch platelets carefully  3-Depression/Anxiety --Continue Prozac, continue Xanax as needed -No suicidal ideation or hallucinations currently.  4-GERD (gastroesophageal reflux  disease) -Continue PPI.  5-Pseudotumor cerebri -Continue Diamox -Patient denies headaches currently.  6-acute kidney injury -In the setting of prerenal azotemia, dehydration and transient hypotension -Hold HCTZ and Lasix --- renally adjust medications, avoid nephrotoxic agents / dehydration  / hypotension -Creatinine is down to 1.0 from a high of 1.53 -Follow renal function trend.  7-mild transaminitis ; most likely in the setting of acute COVID infection -Holding statins currently. -AST is down to 91 from a high of 117 -ALT is down to 37 from a high of 53 -Alk phos and T bili WNL -Follow LFTs.  8-history of migraines -Continue as needed Fioricet and rizatriptan.  9-history of COPD -Currently no wheezing -Continue home inhaler regimen. -Steroids treatment as mentioned above, assisting with condition stability.  10-type 2 diabetes with hyperglycemia -No prior history of diabetes or using hypoglycemic agents prior to admission -Steroids therapy causing hyperglycemia -Continue sliding scale insulin and adjusted dose of Levemir -Continue to follow CBGs and adjust hypoglycemic regimen as required. -At discharge patient will benefit of initiation of Metformin.  11)-Morbid obesity (HCC) -Body mass index is 40.4 kg/m. -Low calorie diet, portion control,  increase physical activity discussed with patient. --With concerns for obesity hypoventilation syndrome, sleep apnea;  ----Bipap qhs advised---pt states makes her claustrophobic  12)HypoKalemia--replace and recheck   DVT prophylaxis:Lovenox Code Status: Full code. Family Communication: Patient's boyfriend/significant other  Disposition:   Status is: Inpatient  Dispo: The patient is from: Home              Anticipated d/c is to: ?? LTAC Vs SNF              Anticipated d/c date is: To be determined  Patient currently no medically stable for discharge; still requiring significant amount of oxygen  supplementation (15 L high flow nasal cannula along with nonrebreather), continue treatment with IV steroids, remdesivir (last dose) and baricitinib.  Using BiPAP nightly.   Difficult to place patient: No  Consultants:   None   Procedures:  See below for x-ray reports.  Antimicrobials/antiviral Remdesivir 5 out of 5  Subjective:  04/07/20 -Continue to have profound hypoxia, desaturates very quickly with positional change despite being on  [place] at 15 L plus NRB  - Episodes of Transient Tachycardia and Transient Hypotension--requiring IVF  - Daughter Tori at bedside, questions answered   Objective: Vitals:   04/07/20 1000 04/07/20 1015 04/07/20 1030 04/07/20 1045  BP: (!) 102/53 (!) 102/56 (!) 111/48 (!) 118/44  Pulse: (!) 122 98 98 (!) 110  Resp: (!) 21 (!) 7 (!) 21 (!) 29  Temp:      TempSrc:      SpO2: (!) 63% 94% (!) 87% (!) 85%  Weight:      Height:        Intake/Output Summary (Last 24 hours) at 04/07/2020 1429 Last data filed at 04/07/2020 0500 Gross per 24 hour  Intake 2067 ml  Output 500 ml  Net 1567 ml   Filed Weights   04/01/20 0503 04/05/20 0410 04/06/20 0432  Weight: 127.9 kg 117.7 kg 117 kg    Examination:  Physical Exam Gen:- Awake Alert, conversational dyspnea HEENT:- [place].AT, No sclera icterus Nose:- [place] at 15 L plus NRB  Neck-Supple Neck,No JVD,.  Lungs-fair air movement, no wheezing CV- S1, S2 normal Abd-  +ve B.Sounds, Abd Soft, No tenderness, increased truncal adiposity    Extremity/Skin:- No  edema,   good pulses Psych-affect is anxious, oriented x3 Neuro-no new focal deficits, no tremors    Data Reviewed: I have personally reviewed following labs and imaging studies  CBC: Recent Labs  Lab 04/01/20 0217 04/02/20 0532 04/03/20 0620 04/04/20 0520 04/06/20 0447 04/07/20 0936  WBC 2.2* 3.2* 3.6* 4.3 6.7 4.9  NEUTROABS 1.7 2.6 3.0 3.5  --   --   HGB 12.1 11.8* 11.8* 12.1 11.7* 10.7*  HCT 40.5 38.9 38.7 37.8 36.2 33.3*  MCV 92.5  90.9 91.1 88.1 86.8 89.5  PLT 103* 123* 125* 124* 138* 88*    Basic Metabolic Panel: Recent Labs  Lab 04/02/20 0532 04/03/20 0620 04/04/20 0520 04/06/20 0447 04/07/20 0936  NA 137 137 138 135 137  K 3.4* 3.6 3.5 4.0 2.9*  CL 107 109 109 108 109  CO2 21* 20* 22 19* 21*  GLUCOSE 260* 295* 234* 173* 109*  BUN 27* 28* 32* 31* 24*  CREATININE 1.06* 1.11* 1.02* 1.09* 1.03*  CALCIUM 8.6* 8.4* 8.5* 8.2* 7.9*  MG  --   --   --   --  2.1    GFR: Estimated Creatinine Clearance: 91.2 mL/min (A) (by C-G formula based on SCr of 1.03 mg/dL (H)).  Liver Function Tests: Recent Labs  Lab 04/02/20 0532 04/03/20 0620 04/04/20 0520 04/06/20 0447 04/07/20 0936  AST 68* 108* 47* 72* 91*  ALT 42 73* 55* 41 37  ALKPHOS 70 81 75 77 69  BILITOT 0.5 0.7 0.8 0.6 0.7  PROT 6.4* 6.2* 5.8* 5.9* 5.1*  ALBUMIN 3.0* 2.9* 2.9* 2.8* 2.4*    CBG: Recent Labs  Lab 04/06/20 1319 04/06/20 1739 04/07/20 0108 04/07/20 0926 04/07/20 1234  GLUCAP 347* 229* 125* 91 178*     Recent Results (from the past 240 hour(s))  SARS Coronavirus 2 by RT PCR (hospital order, performed in [facility] hospital lab) Nasopharyngeal Nasopharyngeal Swab     Status: Abnormal   Collection Time: 04/09/2020  1:55 PM   Specimen: Nasopharyngeal Swab  Result Value Ref Range Status   SARS Coronavirus 2 POSITIVE (A) NEGATIVE Final    Comment: RESULT CALLED TO, READ BACK BY AND VERIFIED WITH: MD ON  [id number] @ 1530 BY HENDERSON L. (NOTE) SARS-CoV-2 target nucleic acids are DETECTED  SARS-CoV-2 RNA is generally detectable in upper respiratory specimens  during the acute phase of infection.  Positive results are indicative  of the presence of the identified virus, but do not rule out bacterial infection or co-infection with other pathogens not detected by the test.  Clinical correlation with patient history and  other diagnostic information is necessary to determine patient infection status.  The expected result is  negative.  Fact Sheet for Patients:   StrictlyIdeas.no   Fact Sheet for Healthcare Providers:   BankingDealers.co.za    This test is not yet approved or cleared by the [place] FDA and  has been authorized for detection and/or diagnosis of SARS-CoV-2 by FDA under an Emergency Use Authorization (EUA).  This EUA will remain in effect (meaning this  test can be used) for the duration of  the COVID-19 declaration under Section 564(b)(1) of the Act, 21 U.S.C. section 360-bbb-3(b)(1), unless the authorization is terminated or revoked sooner.  Performed at [facility], [address], [place], [place] [address]   Blood Culture (routine x 2)     Status: None   Collection Time: 03/27/2020  1:55 PM   Specimen: Blood  Result Value Ref Range Status   Specimen Description BLOOD BLOOD RIGHT HAND  Final   Special Requests   Final    Blood Culture adequate volume BOTTLES DRAWN AEROBIC AND ANAEROBIC   Culture   Final    NO GROWTH 5 DAYS Performed at [facility], [address] [place], [place] [address]    Report Status 04/04/2020 FINAL  Final  Blood Culture (routine x 2)     Status: None   Collection Time: 04/10/2020  2:00 PM   Specimen: Blood  Result Value Ref Range Status   Specimen Description BLOOD RIGHT ANTECUBITAL  Final   Special Requests   Final    Blood Culture adequate volume BOTTLES DRAWN AEROBIC AND ANAEROBIC   Culture   Final    NO GROWTH 5 DAYS Performed at [facility], [address], [place], [place] [address]    Report Status 04/04/2020 FINAL  Final  MRSA PCR Screening     Status: None   Collection Time: 03/31/20 12:36 AM   Specimen: Nasal Mucosa; Nasopharyngeal  Result Value Ref Range Status   MRSA by PCR NEGATIVE NEGATIVE Final    Comment:        The GeneXpert MRSA Assay (FDA approved for NASAL specimens only), is one component of a comprehensive MRSA colonization surveillance program. It is not intended to  diagnose MRSA infection nor to guide or monitor treatment for MRSA infections. Performed at [facility], [address] [place], [place] [address]     Radiology Studies: US Venous Img Lower Bilateral (DVT)  Result Date: 04/07/2020 CLINICAL DATA:  45 year old female with COVID and breathing issues. Evaluate for DVT. EXAM: BILATERAL LOWER EXTREMITY VENOUS DOPPLER ULTRASOUND TECHNIQUE: Gray-scale sonography with graded compression, as well as color Doppler and duplex ultrasound were performed to evaluate the lower extremity deep venous systems from the  level of the common femoral vein and including the common femoral, femoral, profunda femoral, popliteal and calf veins including the posterior tibial, peroneal and gastrocnemius veins when visible. The superficial great saphenous vein was also interrogated. Spectral Doppler was utilized to evaluate flow at rest and with distal augmentation maneuvers in the common femoral, femoral and popliteal veins. COMPARISON:  None. FINDINGS: RIGHT LOWER EXTREMITY Common Femoral Vein: No evidence of thrombus. Normal compressibility, respiratory phasicity and response to augmentation. Saphenofemoral Junction: No evidence of thrombus. Normal compressibility and flow on color Doppler imaging. Profunda Femoral Vein: No evidence of thrombus. Normal compressibility and flow on color Doppler imaging. Femoral Vein: No evidence of thrombus. Normal compressibility, respiratory phasicity and response to augmentation. Popliteal Vein: No evidence of thrombus. Normal compressibility, respiratory phasicity and response to augmentation. Calf Veins: No evidence of thrombus. Normal compressibility and flow on color Doppler imaging. Superficial Great Saphenous Vein: No evidence of thrombus. Normal compressibility. Venous Reflux:  None. Other Findings:  None. LEFT LOWER EXTREMITY Common Femoral Vein: No evidence of thrombus. Normal compressibility, respiratory phasicity and response to  augmentation. Saphenofemoral Junction: No evidence of thrombus. Normal compressibility and flow on color Doppler imaging. Profunda Femoral Vein: No evidence of thrombus. Normal compressibility and flow on color Doppler imaging. Femoral Vein: No evidence of thrombus. Normal compressibility, respiratory phasicity and response to augmentation. Popliteal Vein: No evidence of thrombus. Normal compressibility, respiratory phasicity and response to augmentation. Calf Veins: No evidence of thrombus. Normal compressibility and flow on color Doppler imaging. Superficial Great Saphenous Vein: No evidence of thrombus. Normal compressibility. Venous Reflux:  None. Other Findings:  None. IMPRESSION: No evidence of deep venous thrombosis in either lower extremity. Electronically Signed   By: Jacqulynn Cadet M.D.   On: 04/07/2020 12:05   DG CHEST PORT 1 VIEW  Result Date: 04/07/2020 CLINICAL DATA:  45 year old female positive COVID-19. Increasing shortness of breath. EXAM: PORTABLE CHEST 1 VIEW COMPARISON:  Chest CT 10/17/2020 and earlier. FINDINGS: Portable AP upright view at 0606 hours. Improved lung volume since 04/16/2020. Regressed but not resolved asymmetric pulmonary interstitial and indistinct opacity now mostly on the left. Mediastinal contours remain normal. Visualized tracheal air column is within normal limits. No pneumothorax or pleural effusion. Paucity of bowel gas. No osseous abnormality identified. IMPRESSION: Larger lung volumes with regressed but not resolved COVID-19 pneumonia, now more apparent on the left. Electronically Signed   By: Genevie Ann M.D.   On: 04/07/2020 07:40   Scheduled Meds: . (feeding supplement) PROSource Plus  30 mL Oral TID BM  . acetaZOLAMIDE  250 mg Oral BID  . acyclovir  200 mg Oral Daily  . vitamin C  500 mg Oral Daily  . azelastine  2 spray Each Nare Daily  . baricitinib  4 mg Oral Daily  . cholecalciferol  25 mcg Oral Daily  . [START ON 04/08/2020] enoxaparin (LOVENOX)  injection  60 mg Subcutaneous Q12H  . feeding supplement  237 mL Oral BID BM  . feeding supplement (NEPRO CARB STEADY)  237 mL Oral TID BM  . FLUoxetine  40 mg Oral Daily  . fluticasone furoate-vilanterol  1 puff Inhalation Daily  . insulin aspart  0-15 Units Subcutaneous TID WC  . insulin aspart  0-5 Units Subcutaneous QHS  . insulin detemir  14 Units Subcutaneous BID  . loratadine  10 mg Oral Daily  . methocarbamol  500 mg Oral TID  . methylPREDNISolone (SOLU-MEDROL) injection  60 mg Intravenous Q8H  . nystatin  5 mL Oral  QID  . pantoprazole  40 mg Oral BID  . pregabalin  150 mg Oral QHS  . saccharomyces boulardii  250 mg Oral BID  . zinc sulfate  220 mg Oral Daily   Continuous Infusions: . sodium chloride 50 mL/hr at 04/07/20 0800  . potassium chloride 10 mEq (04/07/20 1238)     LOS: 8 days    Roxan Hockey, MD [facility]   To contact the attending provider between 7A-7P or the covering provider during after hours 7P-7A, please log into the web site [email or web address] and access using universal [place] password for that web site. If you do not have the password, please call the hospital operator.  04/07/2020, 2:29 PM

## 2020-04-07 NOTE — Progress Notes (Signed)
Patient laying in bed, resting comfortably. Vitals stable, afebrile. Blood pressure improved from this AM. Patient appears more alert and interactive. She does continue to complain of a cough. Bedside report give to Fayrene Fearing, [place]. All questions answered. Signing off and relieved of care

## 2020-04-08 DIAGNOSIS — U071 COVID-19: Secondary | ICD-10-CM | POA: Diagnosis not present

## 2020-04-08 DIAGNOSIS — J1282 Pneumonia due to coronavirus disease 2019: Secondary | ICD-10-CM | POA: Diagnosis not present

## 2020-04-08 LAB — CBC
HCT: 30.4 % — ABNORMAL LOW (ref 36.0–46.0)
Hemoglobin: 9.7 g/dL — ABNORMAL LOW (ref 12.0–15.0)
MCH: 28.5 pg (ref 26.0–34.0)
MCHC: 31.9 g/dL (ref 30.0–36.0)
MCV: 89.4 fL (ref 80.0–100.0)
Platelets: 93 10*3/uL — ABNORMAL LOW (ref 150–400)
RBC: 3.4 MIL/uL — ABNORMAL LOW (ref 3.87–5.11)
RDW: 14.6 % (ref 11.5–15.5)
WBC: 4.3 10*3/uL (ref 4.0–10.5)
nRBC: 0 % (ref 0.0–0.2)

## 2020-04-08 LAB — BASIC METABOLIC PANEL WITH GFR
Anion gap: 7 (ref 5–15)
BUN: 17 mg/dL (ref 6–20)
CO2: 21 mmol/L — ABNORMAL LOW (ref 22–32)
Calcium: 7.9 mg/dL — ABNORMAL LOW (ref 8.9–10.3)
Chloride: 109 mmol/L (ref 98–111)
Creatinine, Ser: 0.95 mg/dL (ref 0.44–1.00)
GFR, Estimated: >60 mL/min
Glucose, Bld: 193 mg/dL — ABNORMAL HIGH (ref 70–99)
Potassium: 3.6 mmol/L (ref 3.5–5.1)
Sodium: 137 mmol/L (ref 135–145)

## 2020-04-08 LAB — GLUCOSE, CAPILLARY
Glucose-Capillary: 111 mg/dL — ABNORMAL HIGH (ref 70–99)
Glucose-Capillary: 213 mg/dL — ABNORMAL HIGH (ref 70–99)
Glucose-Capillary: 241 mg/dL — ABNORMAL HIGH (ref 70–99)
Glucose-Capillary: 169 mg/dL — ABNORMAL HIGH (ref 70–99)

## 2020-04-08 LAB — D-DIMER, QUANTITATIVE: D-Dimer, Quant: 1.58 ug{FEU}/mL — ABNORMAL HIGH (ref 0.00–0.50)

## 2020-04-08 MED ORDER — MENTHOL 3 MG MT LOZG
1.0000 | LOZENGE | Freq: Four times a day (QID) | OROMUCOSAL | Status: AC | PRN
  Administered 2020-04-09 – 2020-04-10 (×2): 3 mg via ORAL
  Filled 2020-04-08 (×2): qty 9

## 2020-04-08 MED ORDER — ENOXAPARIN SODIUM 60 MG/0.6ML ~~LOC~~ SOLN
60.0000 mg | SUBCUTANEOUS | Status: DC
  Administered 2020-04-09: 60 mg via SUBCUTANEOUS
  Filled 2020-04-08: qty 0.6

## 2020-04-08 MED ORDER — POTASSIUM CHLORIDE CRYS ER 20 MEQ PO TBCR
40.0000 meq | EXTENDED_RELEASE_TABLET | Freq: Once | ORAL | Status: AC
  Administered 2020-04-08: 40 meq via ORAL
  Filled 2020-04-08: qty 2

## 2020-04-08 NOTE — Progress Notes (Signed)
PROGRESS NOTE    Sandra [name]  YKD:[id number] DOB: 28-Jun-1975 DOA: 04/07/2020 PCP: Glenda Chroman, MD   Chief Complaint  Patient presents with  . Shortness of Breath    Brief admission Narrative:  Sandra [name]  is a 44 y.o. female, nickel history of GERD,vertigo, depression, anxiety, pseudotumor cerebri, benign intracranial hypertension, with VP shunt, patient is unvaccinated against Covid, admitted on 04/23/2020 with acute hypoxic respiratory failure with O2 sats down to 73% on room air on admission---and found to have Covid 19 respiratory infection/pneumonia-  CTA chest negative for PE on admission--- and she required high flow nasal cannula.  Assessment & Plan:  1) Acute Respiratory Failure with Hypoxia secondary to COVID-19 Pneumonia --Continue Treatment with iv Steroids and Baricitinib. -Patient has been encourage to participate with proning position, incentive spirometer and flutter valve. -Completed Remdesivir already -Continue as needed antitussive medications, vitamin C and zinc. -did not tolerate  BiPAP well  (Claustrophobic)--   Continue also to use Robaxin and prn oxycodone as needed.  04/08/20 -Continue to have profound hypoxia, desaturates very quickly with positional change despite being on  HFNC at 100% Fi02 and 35 L plus NRB  -CXR on 04/07/20-- w/o New acute findings  2)Episodes of Transient Tachycardia and Transient Hypotension--- patient has longstanding history of POTS--- -Gave IVF bolus - c/n IVF  -LE Venous Doppler w/o acute findings Echo with preserved EF and largely unremarkable, --Received Lovenox 120 mg sq x 1 -Okay to continue prophylactic Lovenox at 0.5 mg/kg every 24 hours--patient with thrombocytopenia so watch platelets closely  3-Depression/Anxiety --Continue Prozac, continue Xanax as needed -  4-GERD (gastroesophageal reflux disease) -Continue PPI.  5-Pseudotumor cerebri -Continue Diamox -Patient denies headaches currently.  6-acute  kidney injury -In the setting of prerenal azotemia, dehydration and transient hypotension -Hold HCTZ and Lasix --- renally adjust medications, avoid nephrotoxic agents / dehydration  / hypotension -Renal function normalized  7-mild transaminitis ; most likely in the setting of acute COVID infection -Holding statins currently. -AST is down to 91 from a high of 117 -ALT is down to 37 from a high of 53 -Alk phos and T bili WNL -Follow LFTs.  8-history of migraines -Continue as needed Fioricet and rizatriptan.  9-history of COPD -Currently no wheezing -Continue home inhaler regimen. -Steroids treatment as mentioned above, assisting with condition stability.  10-type 2 diabetes with hyperglycemia -No prior history of diabetes or using hypoglycemic agents prior to admission -Steroids therapy causing hyperglycemia -Continue sliding scale insulin and adjusted dose of Levemir -Continue to follow CBGs and adjust hypoglycemic regimen as required. -At discharge patient will benefit of initiation of Metformin.  11)-Morbid obesity (HCC) -Body mass index is 40.4 kg/m. -Low calorie diet, portion control,  increase physical activity discussed with patient. --With concerns for obesity hypoventilation syndrome, sleep apnea;  ----Bipap qhs advised---pt states makes her claustrophobic  12)HypoKalemia--replaced and recheck   DVT prophylaxis:Lovenox Code Status: Full code. Family Communication: Patient's daughter- Herbert Spires Disposition:   Status is: Inpatient  Dispo: The patient is from: Home              Anticipated d/c is to: ?? LTAC Vs SNF              Anticipated d/c date is: To be determined              Patient currently no medically stable for discharge; still requiring significant amount of oxygen supplementationcontinue treatment with IV steroids, and baricitinib.  Using BiPAP nightly.   Difficult to place  patient: No  Consultants:   None   Procedures:  See below for x-ray  reports.  Antimicrobials/antiviral Remdesivir 5 out of 5  Subjective:  04/08/20 -Profound hypoxia, cough and dyspnea persist -Tachycardia and hypotension improved -No chest pains   Objective: Vitals:   04/08/20 0800 04/08/20 0905 04/08/20 1000 04/08/20 1131  BP: 117/73 (!) 90/43 (!) 111/47 (!) 89/48  Pulse: (!) 102 97 (!) 103 97  Resp: 16 16 (!) 24 18  Temp:    97.9 F (36.6 C)  TempSrc:    Oral  SpO2: (!) 75% (!) 87%  (!) 79%  Weight:      Height:        Intake/Output Summary (Last 24 hours) at 04/08/2020 1139 Last data filed at 04/08/2020 0905 Gross per 24 hour  Intake 1584.87 ml  Output 2300 ml  Net -715.13 ml   Filed Weights   04/01/20 0503 04/05/20 0410 04/06/20 0432  Weight: 127.9 kg 117.7 kg 117 kg    Examination:  Physical Exam Gen:- Awake Alert, conversational dyspnea HEENT:- [place].AT, No sclera icterus Nose:-HFNC at 100% Fi02 and 35 L plus NRB  Neck-Supple Neck,No JVD,.  Lungs-fair air movement, no wheezing CV- S1, S2 normal Abd-  +ve B.Sounds, Abd Soft, No tenderness, increased truncal adiposity    Extremity/Skin:- No  edema,   good pulses Psych-affect is anxious, oriented x3 Neuro-no new focal deficits, no tremors   Data Reviewed: I have personally reviewed following labs and imaging studies  CBC: Recent Labs  Lab 04/02/20 0532 04/03/20 0620 04/04/20 0520 04/06/20 0447 04/07/20 0936 04/08/20 0317  WBC 3.2* 3.6* 4.3 6.7 4.9 4.3  NEUTROABS 2.6 3.0 3.5  --   --   --   HGB 11.8* 11.8* 12.1 11.7* 10.7* 9.7*  HCT 38.9 38.7 37.8 36.2 33.3* 30.4*  MCV 90.9 91.1 88.1 86.8 89.5 89.4  PLT 123* 125* 124* 138* 88* 93*    Basic Metabolic Panel: Recent Labs  Lab 04/03/20 0620 04/04/20 0520 04/06/20 0447 04/07/20 0936 04/08/20 0317  NA 137 138 135 137 137  K 3.6 3.5 4.0 2.9* 3.6  CL 109 109 108 109 109  CO2 20* 22 19* 21* 21*  GLUCOSE 295* 234* 173* 109* 193*  BUN 28* 32* 31* 24* 17  CREATININE 1.11* 1.02* 1.09* 1.03* 0.95  CALCIUM 8.4*  8.5* 8.2* 7.9* 7.9*  MG  --   --   --  2.1  --     GFR: Estimated Creatinine Clearance: 98.9 mL/min (by C-G formula based on SCr of 0.95 mg/dL).  Liver Function Tests: Recent Labs  Lab 04/02/20 0532 04/03/20 0620 04/04/20 0520 04/06/20 0447 04/07/20 0936  AST 68* 108* 47* 72* 91*  ALT 42 73* 55* 41 37  ALKPHOS 70 81 75 77 69  BILITOT 0.5 0.7 0.8 0.6 0.7  PROT 6.4* 6.2* 5.8* 5.9* 5.1*  ALBUMIN 3.0* 2.9* 2.9* 2.8* 2.4*    CBG: Recent Labs  Lab 04/07/20 1234 04/07/20 1725 04/07/20 2239 04/08/20 0749 04/08/20 1130  GLUCAP 178* 229* 245* 111* 213*     Recent Results (from the past 240 hour(s))  SARS Coronavirus 2 by RT PCR (hospital order, performed in [facility] hospital lab) Nasopharyngeal Nasopharyngeal Swab     Status: Abnormal   Collection Time: 04/03/2020  1:55 PM   Specimen: Nasopharyngeal Swab  Result Value Ref Range Status   SARS Coronavirus 2 POSITIVE (A) NEGATIVE Final    Comment: RESULT CALLED TO, READ BACK BY AND VERIFIED WITH: MD ON  [id number] @ [place] (NOTE) SARS-CoV-2 target nucleic acids are DETECTED  SARS-CoV-2 RNA is generally detectable in upper respiratory specimens  during the acute phase of infection.  Positive results are indicative  of the presence of the identified virus, but do not rule out bacterial infection or co-infection with other pathogens not detected by the test.  Clinical correlation with patient history and  other diagnostic information is necessary to determine patient infection status.  The expected result is negative.  Fact Sheet for Patients:   StrictlyIdeas.no   Fact Sheet for Healthcare Providers:   BankingDealers.co.za    This test is not yet approved or cleared by the [place] FDA and  has been authorized for detection and/or diagnosis of SARS-CoV-2 by FDA under an Emergency Use Authorization (EUA).  This EUA will remain in effect (meaning this  test can  be used) for the duration of  the COVID-19 declaration under Section 564(b)(1) of the Act, 21 U.S.C. section 360-bbb-3(b)(1), unless the authorization is terminated or revoked sooner.  Performed at [facility], [address], [place], [place] [address]   Blood Culture (routine x 2)     Status: None   Collection Time: 04/20/2020  1:55 PM   Specimen: Blood  Result Value Ref Range Status   Specimen Description BLOOD BLOOD RIGHT HAND  Final   Special Requests   Final    Blood Culture adequate volume BOTTLES DRAWN AEROBIC AND ANAEROBIC   Culture   Final    NO GROWTH 5 DAYS Performed at [facility], [address], [place], [place] [address]    Report Status 04/04/2020 FINAL  Final  Blood Culture (routine x 2)     Status: None   Collection Time: 04/01/2020  2:00 PM   Specimen: Blood  Result Value Ref Range Status   Specimen Description BLOOD RIGHT ANTECUBITAL  Final   Special Requests   Final    Blood Culture adequate volume BOTTLES DRAWN AEROBIC AND ANAEROBIC   Culture   Final    NO GROWTH 5 DAYS Performed at [facility], [address], [place], [place] [address]    Report Status 04/04/2020 FINAL  Final  MRSA PCR Screening     Status: None   Collection Time: 03/31/20 12:36 AM   Specimen: Nasal Mucosa; Nasopharyngeal  Result Value Ref Range Status   MRSA by PCR NEGATIVE NEGATIVE Final    Comment:        The GeneXpert MRSA Assay (FDA approved for NASAL specimens only), is one component of a comprehensive MRSA colonization surveillance program. It is not intended to diagnose MRSA infection nor to guide or monitor treatment for MRSA infections. Performed at [facility], [address], [place],  [address]     Radiology Studies: US Venous Img Lower Bilateral (DVT)  Result Date: 04/07/2020 CLINICAL DATA:  45 year old female with COVID and breathing issues. Evaluate for DVT. EXAM: BILATERAL LOWER EXTREMITY VENOUS DOPPLER ULTRASOUND TECHNIQUE: Gray-scale sonography with  graded compression, as well as color Doppler and duplex ultrasound were performed to evaluate the lower extremity deep venous systems from the level of the common femoral vein and including the common femoral, femoral, profunda femoral, popliteal and calf veins including the posterior tibial, peroneal and gastrocnemius veins when visible. The superficial great saphenous vein was also interrogated. Spectral Doppler was utilized to evaluate flow at rest and with distal augmentation maneuvers in the common femoral, femoral and popliteal veins. COMPARISON:  None. FINDINGS: RIGHT LOWER EXTREMITY Common Femoral  Vein: No evidence of thrombus. Normal compressibility, respiratory phasicity and response to augmentation. Saphenofemoral Junction: No evidence of thrombus. Normal compressibility and flow on color Doppler imaging. Profunda Femoral Vein: No evidence of thrombus. Normal compressibility and flow on color Doppler imaging. Femoral Vein: No evidence of thrombus. Normal compressibility, respiratory phasicity and response to augmentation. Popliteal Vein: No evidence of thrombus. Normal compressibility, respiratory phasicity and response to augmentation. Calf Veins: No evidence of thrombus. Normal compressibility and flow on color Doppler imaging. Superficial Great Saphenous Vein: No evidence of thrombus. Normal compressibility. Venous Reflux:  None. Other Findings:  None. LEFT LOWER EXTREMITY Common Femoral Vein: No evidence of thrombus. Normal compressibility, respiratory phasicity and response to augmentation. Saphenofemoral Junction: No evidence of thrombus. Normal compressibility and flow on color Doppler imaging. Profunda Femoral Vein: No evidence of thrombus. Normal compressibility and flow on color Doppler imaging. Femoral Vein: No evidence of thrombus. Normal compressibility, respiratory phasicity and response to augmentation. Popliteal Vein: No evidence of thrombus. Normal compressibility, respiratory phasicity  and response to augmentation. Calf Veins: No evidence of thrombus. Normal compressibility and flow on color Doppler imaging. Superficial Great Saphenous Vein: No evidence of thrombus. Normal compressibility. Venous Reflux:  None. Other Findings:  None. IMPRESSION: No evidence of deep venous thrombosis in either lower extremity. Electronically Signed   By: Jacqulynn Cadet M.D.   On: 04/07/2020 12:05   DG CHEST PORT 1 VIEW  Result Date: 04/07/2020 CLINICAL DATA:  45 year old female positive COVID-19. Increasing shortness of breath. EXAM: PORTABLE CHEST 1 VIEW COMPARISON:  Chest CT 10/17/2020 and earlier. FINDINGS: Portable AP upright view at 0606 hours. Improved lung volume since 03/27/2020. Regressed but not resolved asymmetric pulmonary interstitial and indistinct opacity now mostly on the left. Mediastinal contours remain normal. Visualized tracheal air column is within normal limits. No pneumothorax or pleural effusion. Paucity of bowel gas. No osseous abnormality identified. IMPRESSION: Larger lung volumes with regressed but not resolved COVID-19 pneumonia, now more apparent on the left. Electronically Signed   By: Genevie Ann M.D.   On: 04/07/2020 07:40   ECHOCARDIOGRAM LIMITED  Result Date: 04/07/2020    ECHOCARDIOGRAM LIMITED REPORT   Patient Name:   AVYANA PUFFENBARGER Date of Exam: 04/07/2020 Medical Rec #:  [id number]      Height:       67.0 in Accession #:    [id number]     Weight:       257.9 lb Date of Birth:  10-11-1975      BSA:          2.253 m Patient Age:    1 years       BP:           118/44 mmHg Patient Gender: F              HR:           110 bpm. Exam Location:  [facility] Procedure: Limited Echo, Cardiac Doppler and Color Doppler Indications:    Abnormal EKG  History:        Patient has prior history of Echocardiogram examinations, most                 recent 04/04/2015. COPD, Signs/Symptoms:Shortness of Breath and                 Chest Pain; Risk Factors:Hypertension, Current Smoker, Morbid                  obesity and Diabetes. Resp. failure, COVID+.  Sonographer:    Lavenia Atlas RDCS Referring Phys: [phone] Naoko Diperna  Sonographer Comments: COVID+ IMPRESSIONS  1. Left ventricular ejection fraction, by estimation, is 70 to 75%. The left ventricle has hyperdynamic function. Left ventricular diastolic parameters were normal.  2. Right ventricular systolic function is normal. The right ventricular size is normal.  3. The mitral valve is grossly normal. No evidence of mitral valve regurgitation.  4. The aortic valve is grossly normal. Aortic valve regurgitation is not visualized. No aortic stenosis is present. Comparison(s): A prior study was performed on 04/04/2015. Prior images reviewed side by side. Increase in LV function. Elevation in LVOT gradient related to increase in LV function. FINDINGS  Left Ventricle: Left ventricular ejection fraction, by estimation, is 70 to 75%. The left ventricle has hyperdynamic function. The left ventricular internal cavity size was normal in size. There is no left ventricular hypertrophy. Left ventricular diastolic parameters were normal. Right Ventricle: The right ventricular size is normal. No increase in right ventricular wall thickness. Right ventricular systolic function is normal. Left Atrium: Left atrial size was normal in size. Right Atrium: Right atrial size was normal in size. Pericardium: Trivial pericardial effusion is present. Mitral Valve: The mitral valve is grossly normal. Aortic Valve: DVI 0.68. The aortic valve is grossly normal. Aortic valve regurgitation is not visualized. No aortic stenosis is present. Aortic valve mean gradient measures 11.0 mmHg. Aortic valve peak gradient measures 20.0 mmHg. Aorta: The aortic root is normal in size and structure and the ascending aorta was not well visualized. LEFT VENTRICLE PLAX 2D LVIDd:         4.47 cm  Diastology LVIDs:         2.76 cm  LV e' medial:    0.53 cm/s LV PW:         1.14 cm  LV E/e' medial:  2.0  LV IVS:        0.96 cm  LV e' lateral:   0.10 cm/s LVOT diam:     2.00 cm  LV E/e' lateral: 10.8 LVOT Area:     3.14 cm  RIGHT VENTRICLE RV S prime:     12.60 cm/s LEFT ATRIUM         Index LA diam:    3.60 cm 1.60 cm/m  AORTIC VALVE AV Vmax:      223.50 cm/s AV Vmean:     150.000 cm/s AV VTI:       0.356 m AV Peak Grad: 20.0 mmHg AV Mean Grad: 11.0 mmHg  AORTA Ao Root diam: 2.60 cm MITRAL VALVE MV Area (PHT): 5.38 cm     SHUNTS MV Decel Time: 141 msec     Systemic Diam: 2.00 cm MV E velocity: 1.03 cm/s MV A velocity: 108.00 cm/s MV E/A ratio:  0.01 Riley Lam MD Electronically signed by Riley Lam MD Signature Date/Time: 04/07/2020/3:07:46 PM    Final    Scheduled Meds: . (feeding supplement) PROSource Plus  30 mL Oral TID BM  . acetaZOLAMIDE  250 mg Oral BID  . acyclovir  200 mg Oral Daily  . vitamin C  500 mg Oral Daily  . azelastine  2 spray Each Nare Daily  . baricitinib  4 mg Oral Daily  . cholecalciferol  25 mcg Oral Daily  . enoxaparin (LOVENOX) injection  60 mg Subcutaneous Q12H  . feeding supplement  237 mL Oral BID BM  . feeding supplement (NEPRO CARB STEADY)  237 mL Oral TID BM  . FLUoxetine  40  mg Oral Daily  . fluticasone furoate-vilanterol  1 puff Inhalation Daily  . insulin aspart  0-15 Units Subcutaneous TID WC  . insulin aspart  0-5 Units Subcutaneous QHS  . insulin detemir  14 Units Subcutaneous BID  . loratadine  10 mg Oral Daily  . methocarbamol  500 mg Oral TID  . methylPREDNISolone (SOLU-MEDROL) injection  60 mg Intravenous Q8H  . nystatin  5 mL Oral QID  . pantoprazole  40 mg Oral BID  . pregabalin  150 mg Oral QHS  . saccharomyces boulardii  250 mg Oral BID  . zinc sulfate  220 mg Oral Daily   Continuous Infusions: . sodium chloride 100 mL/hr at 04/08/20 0829     LOS: 9 days   Roxan Hockey, MD [facility]   To contact the attending provider between 7A-7P or the covering provider during after hours 7P-7A, please log into the  web site [email or web address] and access using universal [place] password for that web site. If you do not have the password, please call the hospital operator.  04/08/2020, 11:39 AM

## 2020-04-08 NOTE — Progress Notes (Signed)
Pt shows SPO2 of widely varying values. RT has been in the room with patient multiple times. Spot check of pt. shows no obvious or stated distress. SPO2 93. Pt now on side and in no obvious or stated distress. Room SPO2 still swings wildly compared to Portable probe. Spot check of SPO2 shows 93-95%

## 2020-04-09 ENCOUNTER — Inpatient Hospital Stay (HOSPITAL_COMMUNITY): Payer: Medicare Other

## 2020-04-09 DIAGNOSIS — U071 COVID-19: Secondary | ICD-10-CM | POA: Diagnosis not present

## 2020-04-09 DIAGNOSIS — J1282 Pneumonia due to coronavirus disease 2019: Secondary | ICD-10-CM | POA: Diagnosis not present

## 2020-04-09 DIAGNOSIS — I2699 Other pulmonary embolism without acute cor pulmonale: Secondary | ICD-10-CM | POA: Diagnosis present

## 2020-04-09 LAB — CBC
HCT: 32.7 % — ABNORMAL LOW (ref 36.0–46.0)
Hemoglobin: 10 g/dL — ABNORMAL LOW (ref 12.0–15.0)
MCH: 28.4 pg (ref 26.0–34.0)
MCHC: 30.6 g/dL (ref 30.0–36.0)
MCV: 92.9 fL (ref 80.0–100.0)
Platelets: 82 10*3/uL — ABNORMAL LOW (ref 150–400)
RBC: 3.52 MIL/uL — ABNORMAL LOW (ref 3.87–5.11)
RDW: 15.1 % (ref 11.5–15.5)
WBC: 4.6 10*3/uL (ref 4.0–10.5)
nRBC: 0 % (ref 0.0–0.2)

## 2020-04-09 LAB — COMPREHENSIVE METABOLIC PANEL
ALT: 38 U/L (ref 0–44)
AST: 63 U/L — ABNORMAL HIGH (ref 15–41)
Albumin: 2.3 g/dL — ABNORMAL LOW (ref 3.5–5.0)
Alkaline Phosphatase: 61 U/L (ref 38–126)
Anion gap: 8 (ref 5–15)
BUN: 19 mg/dL (ref 6–20)
CO2: 19 mmol/L — ABNORMAL LOW (ref 22–32)
Calcium: 8.1 mg/dL — ABNORMAL LOW (ref 8.9–10.3)
Chloride: 110 mmol/L (ref 98–111)
Creatinine, Ser: 0.77 mg/dL (ref 0.44–1.00)
GFR, Estimated: >60 mL/min (ref >60)
Glucose, Bld: 198 mg/dL — ABNORMAL HIGH (ref 70–99)
Potassium: 3.7 mmol/L (ref 3.5–5.1)
Sodium: 137 mmol/L (ref 135–145)
Total Bilirubin: 0.5 mg/dL (ref 0.3–1.2)
Total Protein: 5.2 g/dL — ABNORMAL LOW (ref 6.5–8.1)

## 2020-04-09 LAB — GLUCOSE, CAPILLARY
Glucose-Capillary: 162 mg/dL — ABNORMAL HIGH (ref 70–99)
Glucose-Capillary: 155 mg/dL — ABNORMAL HIGH (ref 70–99)
Glucose-Capillary: 163 mg/dL — ABNORMAL HIGH (ref 70–99)
Glucose-Capillary: 139 mg/dL — ABNORMAL HIGH (ref 70–99)

## 2020-04-09 LAB — D-DIMER, QUANTITATIVE: D-Dimer, Quant: 1.82 ug/mL-FEU — ABNORMAL HIGH (ref 0.00–0.50)

## 2020-04-09 MED ORDER — FUROSEMIDE 10 MG/ML IJ SOLN
20.0000 mg | Freq: Once | INTRAMUSCULAR | Status: AC
  Administered 2020-04-09: 20 mg via INTRAVENOUS
  Filled 2020-04-09: qty 2

## 2020-04-09 MED ORDER — ENOXAPARIN SODIUM 120 MG/0.8ML ~~LOC~~ SOLN
120.0000 mg | Freq: Two times a day (BID) | SUBCUTANEOUS | Status: DC
  Administered 2020-04-09 – 2020-04-11 (×4): 120 mg via SUBCUTANEOUS
  Filled 2020-04-09 (×3): qty 0.8

## 2020-04-09 MED ORDER — IOHEXOL 350 MG/ML SOLN
100.0000 mL | Freq: Once | INTRAVENOUS | Status: AC | PRN
  Administered 2020-04-09: 100 mL via INTRAVENOUS

## 2020-04-09 MED ORDER — METHYLPREDNISOLONE SODIUM SUCC 125 MG IJ SOLR
60.0000 mg | Freq: Two times a day (BID) | INTRAMUSCULAR | Status: DC
  Administered 2020-04-09 – 2020-04-14 (×11): 60 mg via INTRAVENOUS
  Filled 2020-04-09 (×11): qty 2

## 2020-04-09 NOTE — TOC Progression Note (Signed)
Transition of Care [facility]) - Progression Note    Patient Details  Name: Sandra [name] MRN: [id number] Date of Birth: May 23, 1975  [facility] CM/SW Contact  Barry Brunner, LCSW Phone Number: 04/09/2020, 3:12 PM  Clinical Narrative:    CSW observed patient's HH recommendation. CSW inquired about patient's agreeableness to [facility]. Patient agreeable to [facility] and stated they did not have a prefferred agency. CSW referred patient to [place] with [facility]. Clif[facility] Custard agreeable to begin services upon discharge. TOC to follow.        Expected Discharge Plan and Services                                                 Social Determinants of Health (SDOH) Interventions    Readmission Risk Interventions No flowsheet data found.

## 2020-04-09 NOTE — Plan of Care (Signed)
  Problem: Acute Rehab PT Goals(only PT should resolve) Goal: Pt Will Go Supine/Side To Sit Outcome: Progressing Flowsheets (Taken 04/09/2020 1233) Pt will go Supine/Side to Sit:  Independently  with modified independence Goal: Patient Will Transfer Sit To/From Stand Outcome: Progressing Flowsheets (Taken 04/09/2020 1233) Patient will transfer sit to/from stand:  with modified independence  with supervision Goal: Pt Will Transfer Bed To Chair/Chair To Bed Outcome: Progressing Flowsheets (Taken 04/09/2020 1233) Pt will Transfer Bed to Chair/Chair to Bed:  with modified independence  with supervision Goal: Pt Will Ambulate Outcome: Progressing Flowsheets (Taken 04/09/2020 1233) Pt will Ambulate:  50 feet  with supervision  with rolling walker  with least restrictive assistive device   12:33 PM, 04/09/20 Ocie Bob, MPT Physical Therapist with [facility] 336 [phone] office [phone] mobile phone

## 2020-04-09 NOTE — Progress Notes (Signed)
PROGRESS NOTE    Sandra [name]  WER:[id number] DOB: 1975/05/15 DOA: 04/11/2020 PCP: Glenda Chroman, MD   Chief Complaint  Patient presents with  . Shortness of Breath    Brief admission Narrative:  Sandra [name]  is a 45 y.o. female, nickel history of GERD,vertigo, depression, anxiety, pseudotumor cerebri, benign intracranial hypertension, with VP shunt, patient is unvaccinated against Covid, admitted on 04/11/2020 with acute hypoxic respiratory failure with O2 sats down to 73% on room air on admission---and found to have Covid 19 respiratory infection/pneumonia-  CTA chest negative for PE on admission--- and she required high flow nasal cannula.  Assessment & Plan:  1) Acute Respiratory Failure with Hypoxia secondary to COVID-19 Pneumonia --Continue Treatment with iv Steroids and Baricitinib. -Patient has been encourage to participate with proning position, incentive spirometer and flutter valve. -Completed Remdesivir already -Continue as needed antitussive medications, vitamin C and zinc. --Encourage BiPAP use nightly   Continue also to use Robaxin and prn oxycodone as needed.  04/09/20 -Continue to have profound hypoxia, desaturates very quickly with positional change despite being on  HFNC at 100% Fi02 and 25 L plus NRB  -CXR on 04/07/20-- w/o New acute findings -CTA chest ordered for 04/09/2020  2)Episodes of Transient Tachycardia and Transient Hypotension--- patient has longstanding history of POTS--- Improved with IVFs - c/n IVF  -LE Venous Doppler w/o acute findings Echo with preserved EF and largely unremarkable, --Received Lovenox 120 mg sq x 1 -Okay to continue prophylactic Lovenox at 0.5 mg/kg every 24 hours--patient with thrombocytopenia so watch platelets closely --CTA chest ordered for 04/09/2020  3-Depression/Anxiety --Continue Prozac, continue Xanax as needed -  4-GERD (gastroesophageal reflux disease) -Continue PPI.  5-Pseudotumor cerebri -Continue  Diamox -Patient denies headaches currently.  6-acute kidney injury -In the setting of prerenal azotemia, dehydration and transient hypotension -Hold HCTZ and Lasix --- renally adjust medications, avoid nephrotoxic agents / dehydration  / hypotension -Renal function normalized  7-mild transaminitis ; most likely in the setting of acute COVID infection -Holding statins currently. -AST is down to 63 from a high of 117 -ALT is down to 38 from a high of 53 -Alk phos and T bili WNL -Follow LFTs.  8-history of migraines -Continue as needed Fioricet and rizatriptan.  9-history of COPD -Continue   inhaler regimen. -Steroids treatment as  above,   10-type 2 diabetes with hyperglycemia -No prior history of diabetes or using hypoglycemic agents prior to admission -Steroids therapy causing hyperglycemia -Continue sliding scale insulin and adjusted dose of Levemir -Continue to follow CBGs and adjust hypoglycemic regimen as required. -At discharge patient may  benefit of initiation of Metformin.  11)-Morbid obesity (HCC) -Body mass index is 40.4 kg/m. -Low calorie diet, portion control,  increase physical activity discussed with patient. --With concerns for obesity hypoventilation syndrome, sleep apnea;  ----Bipap qhs advised---pt states makes her claustrophobic  12)HypoKalemia--replaced and recheck  13)Thrombocytopenia/Anemia--- platelets currently above 80k, ---hemoglobin currently close to 10 -No obvious  bleeding noted - watch closely especially while on Lovenox   DVT prophylaxis:Lovenox Code Status: Full code. Family Communication: Patient's daughter- Herbert Spires Disposition:   Status is: Inpatient  Dispo: The patient is from: Home              Anticipated d/c is to: ?? LTAC Vs SNF              Anticipated d/c date is: To be determined              Patient currently no medically  stable for discharge; still requiring significant amount of oxygen supplementationcontinue treatment  with IV steroids, and baricitinib.  Using BiPAP nightly.   Difficult to place patient: No  Consultants:   None   Procedures:  See below for x-ray reports.  Antimicrobials/antiviral Remdesivir 5 out of 5  Subjective:  04/09/20 -Able to get up in the chair -Hypoxia persist requiring 100% HFNC at 25L/hr in addition to a nonrebreather bag -No chest pains  =-cough and shortness of breath persist  Objective: Vitals:   04/09/20 1100 04/09/20 1200 04/09/20 1300 04/09/20 1319  BP: (!) 96/55 (!) 99/59 (!) 108/54 (!) 108/54  Pulse: 76 80 (!) 101 70  Resp: 15 (!) '29 20 18  ' Temp:      TempSrc:      SpO2: 91% (!) 82% (!) 68% 95%  Weight:      Height:        Intake/Output Summary (Last 24 hours) at 04/09/2020 1325 Last data filed at 04/09/2020 1000 Gross per 24 hour  Intake 2054.23 ml  Output 1100 ml  Net 954.23 ml   Filed Weights   04/01/20 0503 04/05/20 0410 04/06/20 0432  Weight: 127.9 kg 117.7 kg 117 kg    Examination:  Physical Exam Gen:- Awake Alert, conversational dyspnea HEENT:- [place].AT, No sclera icterus Nose:-HFNC at 100% Fi02 and 25 L plus NRB  Neck-Supple Neck,No JVD,.  Lungs-fair air movement, no wheezing CV- S1, S2 normal Abd-  +ve B.Sounds, Abd Soft, No tenderness, increased truncal adiposity    Extremity/Skin:- No  edema,   good pulses Psych-affect is anxious, oriented x3 Neuro-generalized weakness, no new focal deficits, no tremors   Data Reviewed: I have personally reviewed following labs and imaging studies  CBC: Recent Labs  Lab 04/03/20 0620 04/04/20 0520 04/06/20 0447 04/07/20 0936 04/08/20 0317 04/09/20 0440  WBC 3.6* 4.3 6.7 4.9 4.3 4.6  NEUTROABS 3.0 3.5  --   --   --   --   HGB 11.8* 12.1 11.7* 10.7* 9.7* 10.0*  HCT 38.7 37.8 36.2 33.3* 30.4* 32.7*  MCV 91.1 88.1 86.8 89.5 89.4 92.9  PLT 125* 124* 138* 88* 93* 82*    Basic Metabolic Panel: Recent Labs  Lab 04/04/20 0520 04/06/20 0447 04/07/20 0936 04/08/20 0317  04/09/20 0440  NA 138 135 137 137 137  K 3.5 4.0 2.9* 3.6 3.7  CL 109 108 109 109 110  CO2 22 19* 21* 21* 19*  GLUCOSE 234* 173* 109* 193* 198*  BUN 32* 31* 24* 17 19  CREATININE 1.02* 1.09* 1.03* 0.95 0.77  CALCIUM 8.5* 8.2* 7.9* 7.9* 8.1*  MG  --   --  2.1  --   --     GFR: Estimated Creatinine Clearance: 117.5 mL/min (by C-G formula based on SCr of 0.77 mg/dL).  Liver Function Tests: Recent Labs  Lab 04/03/20 0620 04/04/20 0520 04/06/20 0447 04/07/20 0936 04/09/20 0440  AST 108* 47* 72* 91* 63*  ALT 73* 55* 41 37 38  ALKPHOS 81 75 77 69 61  BILITOT 0.7 0.8 0.6 0.7 0.5  PROT 6.2* 5.8* 5.9* 5.1* 5.2*  ALBUMIN 2.9* 2.9* 2.8* 2.4* 2.3*    CBG: Recent Labs  Lab 04/08/20 1130 04/08/20 1726 04/08/20 2020 04/09/20 0749 04/09/20 1242  GLUCAP 213* 241* 169* 162* 155*     Recent Results (from the past 240 hour(s))  SARS Coronavirus 2 by RT PCR (hospital order, performed in [facility] hospital lab) Nasopharyngeal Nasopharyngeal Swab     Status: Abnormal   Collection  Time: 03/28/2020  1:55 PM   Specimen: Nasopharyngeal Swab  Result Value Ref Range Status   SARS Coronavirus 2 POSITIVE (A) NEGATIVE Final    Comment: RESULT CALLED TO, READ BACK BY AND VERIFIED WITH: MD ON  [id number] @ 1530 BY HENDERSON L. (NOTE) SARS-CoV-2 target nucleic acids are DETECTED  SARS-CoV-2 RNA is generally detectable in upper respiratory specimens  during the acute phase of infection.  Positive results are indicative  of the presence of the identified virus, but do not rule out bacterial infection or co-infection with other pathogens not detected by the test.  Clinical correlation with patient history and  other diagnostic information is necessary to determine patient infection status.  The expected result is negative.  Fact Sheet for Patients:   StrictlyIdeas.no   Fact Sheet for Healthcare Providers:   BankingDealers.co.za    This test is  not yet approved or cleared by the [place] FDA and  has been authorized for detection and/or diagnosis of SARS-CoV-2 by FDA under an Emergency Use Authorization (EUA).  This EUA will remain in effect (meaning this  test can be used) for the duration of  the COVID-19 declaration under Section 564(b)(1) of the Act, 21 U.S.C. section 360-bbb-3(b)(1), unless the authorization is terminated or revoked sooner.  Performed at [facility], [address], [place], [place] [address]   Blood Culture (routine x 2)     Status: None   Collection Time: 03/29/2020  1:55 PM   Specimen: Blood  Result Value Ref Range Status   Specimen Description BLOOD BLOOD RIGHT HAND  Final   Special Requests   Final    Blood Culture adequate volume BOTTLES DRAWN AEROBIC AND ANAEROBIC   Culture   Final    NO GROWTH 5 DAYS Performed at [facility], [address] [place], [place] [address]    Report Status 04/04/2020 FINAL  Final  Blood Culture (routine x 2)     Status: None   Collection Time: 04/20/2020  2:00 PM   Specimen: Blood  Result Value Ref Range Status   Specimen Description BLOOD RIGHT ANTECUBITAL  Final   Special Requests   Final    Blood Culture adequate volume BOTTLES DRAWN AEROBIC AND ANAEROBIC   Culture   Final    NO GROWTH 5 DAYS Performed at [facility], [address] [place], [place] [address]    Report Status 04/04/2020 FINAL  Final  MRSA PCR Screening     Status: None   Collection Time: 03/31/20 12:36 AM   Specimen: Nasal Mucosa; Nasopharyngeal  Result Value Ref Range Status   MRSA by PCR NEGATIVE NEGATIVE Final    Comment:        The GeneXpert MRSA Assay (FDA approved for NASAL specimens only), is one component of a comprehensive MRSA colonization surveillance program. It is not intended to diagnose MRSA infection nor to guide or monitor treatment for MRSA infections. Performed at [facility], [address] [place], [place] [address]     Radiology Studies: No results  found. Scheduled Meds: . (feeding supplement) PROSource Plus  30 mL Oral TID BM  . acetaZOLAMIDE  250 mg Oral BID  . acyclovir  200 mg Oral Daily  . vitamin C  500 mg Oral Daily  . azelastine  2 spray Each Nare Daily  . baricitinib  4 mg Oral Daily  . cholecalciferol  25 mcg Oral Daily  . enoxaparin (LOVENOX) injection  60 mg Subcutaneous Q24H  . feeding supplement  237  mL Oral BID BM  . feeding supplement (NEPRO CARB STEADY)  237 mL Oral TID BM  . FLUoxetine  40 mg Oral Daily  . fluticasone furoate-vilanterol  1 puff Inhalation Daily  . insulin aspart  0-15 Units Subcutaneous TID WC  . insulin aspart  0-5 Units Subcutaneous QHS  . insulin detemir  14 Units Subcutaneous BID  . loratadine  10 mg Oral Daily  . methocarbamol  500 mg Oral TID  . methylPREDNISolone (SOLU-MEDROL) injection  60 mg Intravenous Q8H  . nystatin  5 mL Oral QID  . pantoprazole  40 mg Oral BID  . pregabalin  150 mg Oral QHS  . saccharomyces boulardii  250 mg Oral BID  . zinc sulfate  220 mg Oral Daily   Continuous Infusions: . sodium chloride 100 mL/hr at 04/09/20 1254    LOS: 10 days   Roxan Hockey, MD [facility]   To contact the attending provider between 7A-7P or the covering provider during after hours 7P-7A, please log into the web site [email or web address] and access using universal [place] password for that web site. If you do not have the password, please call the hospital operator.  04/09/2020, 1:25 PM

## 2020-04-09 NOTE — Progress Notes (Signed)
  Addendum- CTA Chest results discussed with radiologist with finding of pulmonary embolism and concern for possible ARDS versus edema  --Discussed with pharmacist will treat with therapeutic Lovenox for PE and watch platelets closely - -Stop IV fluids and give low-dose Lasix -Continue BiPAP nightly, High  flow nasal cannula during the day -Get PCCM consult  Shon Hale, MD

## 2020-04-09 NOTE — Evaluation (Signed)
Physical Therapy Evaluation Patient Details Name: Sandra [name] MRN: [id number] DOB: 04/12/1975 Today's Date: 04/09/2020   History of Present Illness  Sandra [name]  is a 45 y.o. female, nickel history of GERD,vertigo, depression, anxiety, pseudotumor cerebri, benign intracranial hypertension, with VP shunt, patient is unvaccinated against Covid, she presents with shortness of breath, cough, patient diagnosed with COVID-19 7 days ago with a home kit, she reports cough, shortness of breath, fatigue and body ache, but her symptoms continue to worsen, report dyspnea has worsened over the last 2 to 3 days, as well he reports chest pain related to deep breathing, and coughing, prompted her to come to ED.  - in ED patient saturation 73% on room air, she is currently on 15 L high flow nasal cannula, CTA chest negative for PE, but significant for severe COVID-19 of pneumonia, creatinine elevated at 1.5, AST elevated at 113, [facility] consulted to admit.    Clinical Impression  Patient limited mostly due to SOB, demonstrates good return for taking a few steps at bedside and transferring to chair without loss of balance.  On 37% HFNC O2 mask with SpO2 dropping from 95% to 70% with exertion, increased above 90% after sitting up in chair for 4-5 minutes.  Patient tolerated sitting up in chair after therapy.  Patient will benefit from continued physical therapy in hospital and recommended venue below to increase strength, balance, endurance for safe ADLs and gait.     Follow Up Recommendations Home health PT;Supervision for mobility/OOB;Supervision - Intermittent    Equipment Recommendations  None recommended by PT    Recommendations for Other Services       Precautions / Restrictions Precautions Precautions: Fall Restrictions Weight Bearing Restrictions: No      Mobility  Bed Mobility Overal bed mobility: Needs Assistance Bed Mobility: Supine to Sit     Supine to sit:  Supervision;Modified independent (Device/Increase time)     General bed mobility comments: HOB raised, increased time    Transfers Overall transfer level: Needs assistance Equipment used: Rolling walker (2 wheeled) Transfers: Sit to/from [facility] Sit to Stand: Supervision;Min guard Stand pivot transfers: Supervision;Min guard       General transfer comment: increased time, labored movement  Ambulation/Gait Ambulation/Gait assistance: Min guard;Min assist Gait Distance (Feet): 5 Feet Assistive device: Rolling walker (2 wheeled) Gait Pattern/deviations: Decreased step length - right;Decreased step length - left;Decreased stride length Gait velocity: decreased   General Gait Details: slow labored movement  Stairs            Wheelchair Mobility    Modified Rankin (Stroke Patients Only)       Balance Overall balance assessment: Needs assistance Sitting-balance support: Feet supported;Bilateral upper extremity supported Sitting balance-Leahy Scale: Good Sitting balance - Comments: seated at EOB   Standing balance support: During functional activity;Bilateral upper extremity supported Standing balance-Leahy Scale: Fair Standing balance comment: fair/good using RW                             Pertinent Vitals/Pain Pain Assessment: No/denies pain    Home Living Family/patient expects to be discharged to:: Private residence Living Arrangements: Non-relatives/Friends (fiancee) Available Help at Discharge: Family Type of Home: House Home Access: Stairs to enter Entrance Stairs-Rails: None Entrance Stairs-Number of Steps: 2 Home Layout: One level Home Equipment: Environmental consultant - 2 wheels      Prior Function Level of Independence: Needs assistance   Gait / Transfers Assistance Needed: household  ambulator using RW  ADL's / Homemaking Assistance Needed: assisted by family        Hand Dominance        Extremity/Trunk Assessment    Upper Extremity Assessment Upper Extremity Assessment: Overall WFL for tasks assessed    Lower Extremity Assessment Lower Extremity Assessment: Generalized weakness    Cervical / Trunk Assessment Cervical / Trunk Assessment: Normal  Communication   Communication: No difficulties  Cognition Arousal/Alertness: Awake/alert Behavior During Therapy: WFL for tasks assessed/performed Overall Cognitive Status: Within Functional Limits for tasks assessed                                        General Comments      Exercises     Assessment/Plan    PT Assessment Patient needs continued PT services  PT Problem List Decreased strength;Decreased activity tolerance;Decreased balance;Decreased mobility       PT Treatment Interventions DME instruction;Gait training;Stair training;Functional mobility training;Therapeutic activities;Therapeutic exercise;Balance training;Patient/family education    PT Goals (Current goals can be found in the Care Plan section)  Acute Rehab PT Goals Patient Stated Goal: return home with family to assist PT Goal Formulation: With patient Time For Goal Achievement: 04/16/20 Potential to Achieve Goals: Good    Frequency Min 3X/week   Barriers to discharge        Co-evaluation               AM-PAC PT "6 Clicks" Mobility  Outcome Measure Help needed turning from your back to your side while in a flat bed without using bedrails?: None Help needed moving from lying on your back to sitting on the side of a flat bed without using bedrails?: None Help needed moving to and from a bed to a chair (including a wheelchair)?: A Little Help needed standing up from a chair using your arms (e.g., wheelchair or bedside chair)?: A Little Help needed to walk in hospital room?: A Little Help needed climbing 3-5 steps with a railing? : A Lot 6 Click Score: 19    End of Session Equipment Utilized During Treatment: Oxygen Activity Tolerance:  Patient tolerated treatment well;Patient limited by fatigue Patient left: in chair;with call bell/phone within reach Nurse Communication: Mobility status PT Visit Diagnosis: Unsteadiness on feet (R26.81);Other abnormalities of gait and mobility (R26.89);Muscle weakness (generalized) (M62.81)    Time: 3086-5784 PT Time Calculation (min) (ACUTE ONLY): 31 min   Charges:   PT Evaluation $PT Eval Moderate Complexity: 1 Mod PT Treatments $Therapeutic Activity: 23-37 mins        12:31 PM, 04/09/20 Lonell Grandchild, MPT Physical Therapist with [facility] 336 [phone] office [phone] mobile phone

## 2020-04-09 NOTE — Progress Notes (Signed)
Patient transported from ICU to CT and back without any complications. 

## 2020-04-09 NOTE — TOC Progression Note (Deleted)
Transition of Care [facility]) - Progression Note    Patient Details  Name: Sandra [name] MRN: [id number] Date of Birth: 12-19-1975  [facility]) CM/SW Contact  Barry Brunner, LCSW Phone Number: 04/09/2020, 2:55 PM  Clinical Narrative:    CSW observed patient's HH recommendation. CSW inquired about patient's agreeableness to [facility]. Patient agreeable to [facility] and stated they did not have a prefferred agency. CSW referred patient to [facility] with [facility]. Clifton Custard agreeable to begin services upon discharge. TOC to follow.        Expected Discharge Plan and Services                                                 Social Determinants of Health (SDOH) Interventions    Readmission Risk Interventions No flowsheet data found.

## 2020-04-09 NOTE — Progress Notes (Signed)
Inpatient Diabetes Program Recommendations  AACE/ADA: New Consensus Statement on Inpatient Glycemic Control   Target Ranges:  Prepandial:   less than 140 mg/dL      Peak postprandial:   less than 180 mg/dL (1-2 hours)      Critically ill patients:  140 - 180 mg/dL   Results for Sandra [name], Sandra [name] (MRN [id number]) as of 04/09/2020 10:16  Ref. Range 04/08/2020 07:49 04/08/2020 11:30 04/08/2020 17:26 04/08/2020 20:20 04/09/2020 07:49  Glucose-Capillary Latest Ref Range: 70 - 99 mg/dL 315 (H) 176 (H) 160 (H) 169 (H) 162 (H)   Review of Glycemic Control  Current orders for Inpatient glycemic control: Levemir 14 units BID, Novolog 0-15 units TID with meals, Novolog 0-5 units QHS; Solumedrol 60 mg Q8H  Inpatient Diabetes Program Recommendations:    Insulin: If steroids are continued, please consider ordering Novolog 3 units TID with meals for meal coverage if patient eats at least 50% of meals.  Thanks, Orlando Penner, RN, MSN, CDE Diabetes Coordinator Inpatient Diabetes Program [phone] (Team Pager from 8am to 5pm)

## 2020-04-10 DIAGNOSIS — U071 COVID-19: Secondary | ICD-10-CM | POA: Diagnosis not present

## 2020-04-10 DIAGNOSIS — J1282 Pneumonia due to coronavirus disease 2019: Secondary | ICD-10-CM | POA: Diagnosis not present

## 2020-04-10 DIAGNOSIS — J9601 Acute respiratory failure with hypoxia: Secondary | ICD-10-CM | POA: Diagnosis not present

## 2020-04-10 LAB — CBC
HCT: 34.3 % — ABNORMAL LOW (ref 36.0–46.0)
Hemoglobin: 10.5 g/dL — ABNORMAL LOW (ref 12.0–15.0)
MCH: 28.2 pg (ref 26.0–34.0)
MCHC: 30.6 g/dL (ref 30.0–36.0)
MCV: 92.2 fL (ref 80.0–100.0)
Platelets: 107 10*3/uL — ABNORMAL LOW (ref 150–400)
RBC: 3.72 MIL/uL — ABNORMAL LOW (ref 3.87–5.11)
RDW: 15.1 % (ref 11.5–15.5)
WBC: 5.9 10*3/uL (ref 4.0–10.5)
nRBC: 0 % (ref 0.0–0.2)

## 2020-04-10 LAB — PROTIME-INR
INR: 1.2 (ref 0.8–1.2)
Prothrombin Time: 14.4 seconds (ref 11.4–15.2)

## 2020-04-10 LAB — URINALYSIS, ROUTINE W REFLEX MICROSCOPIC
Bilirubin Urine: NEGATIVE
Glucose, UA: NEGATIVE mg/dL
Ketones, ur: NEGATIVE mg/dL
Nitrite: NEGATIVE
Protein, ur: 100 mg/dL — AB
RBC / HPF: >50 RBC/hpf — ABNORMAL HIGH (ref 0–5)
Specific Gravity, Urine: 1.021 (ref 1.005–1.030)
pH: 6 (ref 5.0–8.0)

## 2020-04-10 LAB — GLUCOSE, CAPILLARY
Glucose-Capillary: 109 mg/dL — ABNORMAL HIGH (ref 70–99)
Glucose-Capillary: 90 mg/dL (ref 70–99)
Glucose-Capillary: 136 mg/dL — ABNORMAL HIGH (ref 70–99)
Glucose-Capillary: 118 mg/dL — ABNORMAL HIGH (ref 70–99)

## 2020-04-10 LAB — APTT: aPTT: 29 seconds (ref 24–36)

## 2020-04-10 LAB — BASIC METABOLIC PANEL
Anion gap: 6 (ref 5–15)
BUN: 26 mg/dL — ABNORMAL HIGH (ref 6–20)
CO2: 25 mmol/L (ref 22–32)
Calcium: 8.3 mg/dL — ABNORMAL LOW (ref 8.9–10.3)
Chloride: 108 mmol/L (ref 98–111)
Creatinine, Ser: 0.82 mg/dL (ref 0.44–1.00)
GFR, Estimated: >60 mL/min (ref >60)
Glucose, Bld: 149 mg/dL — ABNORMAL HIGH (ref 70–99)
Potassium: 3.6 mmol/L (ref 3.5–5.1)
Sodium: 139 mmol/L (ref 135–145)

## 2020-04-10 LAB — BRAIN NATRIURETIC PEPTIDE: B Natriuretic Peptide: 272 pg/mL — ABNORMAL HIGH (ref 0.0–100.0)

## 2020-04-10 MED ORDER — SODIUM CHLORIDE 0.9 % IV SOLN: 1.0000 g | INTRAVENOUS | Status: DC

## 2020-04-10 MED ORDER — FUROSEMIDE 10 MG/ML IJ SOLN
20.0000 mg | Freq: Once | INTRAMUSCULAR | Status: AC
  Administered 2020-04-10: 20 mg via INTRAVENOUS
  Filled 2020-04-10: qty 2

## 2020-04-10 MED ORDER — FUROSEMIDE 10 MG/ML IJ SOLN
20.0000 mg | Freq: Two times a day (BID) | INTRAMUSCULAR | Status: DC
  Administered 2020-04-10 – 2020-04-11 (×3): 20 mg via INTRAVENOUS
  Filled 2020-04-10 (×3): qty 2

## 2020-04-10 MED ORDER — BUTALBITAL-APAP-CAFFEINE 50-325-40 MG PO TABS
1.0000 | ORAL_TABLET | Freq: Four times a day (QID) | ORAL | Status: DC | PRN
  Administered 2020-04-10: 1 via ORAL
  Filled 2020-04-10: qty 1

## 2020-04-10 MED ORDER — SODIUM CHLORIDE 0.9 % IV SOLN
2.0000 g | INTRAVENOUS | Status: DC
  Administered 2020-04-10 – 2020-04-11 (×2): 2 g via INTRAVENOUS
  Filled 2020-04-10 (×2): qty 20

## 2020-04-10 MED ORDER — SODIUM CHLORIDE 0.9 % IV BOLUS
250.0000 mL | Freq: Once | INTRAVENOUS | Status: AC
  Administered 2020-04-10: 250 mL via INTRAVENOUS

## 2020-04-10 NOTE — Progress Notes (Signed)
PROGRESS NOTE    Sandra [name]  Sandra [name] DOB: Sep 08, 1975 DOA: 04/04/2020 PCP: Glenda Chroman, MD   Chief Complaint  Patient presents with  . Shortness of Breath    Brief admission Narrative:  Sandra [name]  is a 45 y.o. female, nickel history of GERD,vertigo, depression, anxiety, pseudotumor cerebri, benign intracranial hypertension, with VP shunt, patient is unvaccinated against Covid, admitted on 04/06/2020 with acute hypoxic respiratory failure with O2 sats down to 73% on room air on admission---and found to have Covid 19 respiratory infection/pneumonia-  CTA chest negative for PE on admission--- and she required high flow nasal cannula.  Assessment & Plan:  1) Acute Respiratory Failure with Hypoxia secondary to COVID-19 Pneumonia --Continue Treatment with iv Steroids and Baricitinib. -Patient has been encourage to participate with proning position, incentive spirometer and flutter valve. -Completed Remdesivir already -Continue as needed antitussive medications, vitamin C and zinc. --Encourage BiPAP use nightly   Continue also to use Robaxin and prn oxycodone as needed.  04/10/20 -Continue to have profound hypoxia, desaturates very quickly with positional change despite being on  HFNC at 100% Fi02 and 20 L plus NRB  -CXR on 04/07/20-- w/o New acute findings -CTA chest on 04/09/2020 with finding of pulmonary embolism and concern for possible ARDS versus edema -  2)Acute PE--- therapeutic Lovenox as ordered, watch platelets closely --LE Venous Doppler w/o acute findings Echo with preserved EF and largely unremarkable,  3)ARDS Vs Pulm Edema--IV Lasix as ordered --Continue BiPAP nightly, High  flow nasal cannula during the day   PCCM consult appreciated  4)Episodes of Transient Tachycardia and Transient Hypotension--- patient has longstanding history of POTS--- Improved with IVFs -Avoid further IV fluids due to concerns for volume overload  5)Fevers--- DC Foley catheter,  empiric treatment with IV Rocephin pending urine culture  6)-Depression/Anxiety --Continue Prozac, continue Xanax as needed -  7)-GERD (gastroesophageal reflux disease) -Continue PPI.  8)-Pseudotumor cerebri -Continue Diamox -Patient denies headaches currently.  9)-acute kidney injury -In the setting of prerenal azotemia, dehydration and transient hypotension -Stopped HCTZ  --- renally adjust medications, avoid nephrotoxic agents / dehydration  / hypotension -Renal function normalized -Renal function closely with reinitiation of IV Lasix  10)-mild transaminitis ; most likely in the setting of acute COVID infection -Holding statins currently. -AST is down to 63 from a high of 117 -ALT is down to 38 from a high of 53 -Alk phos and T bili WNL -Follow LFTs.  11-history of migraines -Continue as needed Fioricet and rizatriptan.  12-history of COPD -Continue   inhaler regimen. -Steroids treatment as  above,   13-type 2 diabetes with hyperglycemia -No prior history of diabetes or using hypoglycemic agents prior to admission -Steroids therapy causing hyperglycemia -Continue sliding scale insulin and adjusted dose of Levemir -Continue to follow CBGs and adjust hypoglycemic regimen as required. -At discharge patient may  benefit of initiation of Metformin.  14)-Morbid obesity (HCC) -Body mass index is 40.4 kg/m. --With concerns for obesity hypoventilation syndrome, sleep apnea;  ----Bipap qhs advised---pt states makes her claustrophobic  15)Thrombocytopenia/Anemia--- platelets currently above 100k, ---hemoglobin currently close to 10 -No obvious  bleeding noted - watch closely especially while on Lovenox   DVT prophylaxis:Lovenox Code Status: Full code. Family Communication: Patient's daughter- Sandra [name] Disposition:   Status is: Inpatient  Dispo: The patient is from: Home              Anticipated d/c is to: ?? LTAC Vs SNF  Anticipated d/c date is: To be  determined              Patient currently no medically stable for discharge; still requiring significant amount of oxygen supplementationcontinue treatment with IV steroids, and baricitinib.  Using BiPAP nightly.   Difficult to place patient: No  Consultants:   None   Procedures:  See below for x-ray reports.  Antimicrobials/antiviral Remdesivir 5 out of 5  Subjective:  04/10/20 -Feels depressed today as today is a funeral service for him late morning who passed away at age 80 -Cough and dyspnea is better  No Nausea, Vomiting or Diarrhea -Fevers noted  Objective: Vitals:   04/10/20 0600 04/10/20 0622 04/10/20 0837 04/10/20 1219  BP: 126/68     Pulse: 71     Resp: (!) 25     Temp: 97.6 F (36.4 C)   (!) 101.5 F (38.6 C)  TempSrc: Oral   Axillary  SpO2: 90%  (!) 87%   Weight:  124.9 kg    Height:        Intake/Output Summary (Last 24 hours) at 04/10/2020 1336 Last data filed at 04/10/2020 1610 Gross per 24 hour  Intake 527.45 ml  Output 2875 ml  Net -2347.55 ml   Filed Weights   04/05/20 0410 04/06/20 0432 04/10/20 0622  Weight: 117.7 kg 117 kg 124.9 kg    Examination:  Physical Exam Gen:- Awake Alert, conversational dyspnea HEENT:- [place].AT, No sclera icterus Nose:-HFNC at 100% Fi02 and 20 L plus NRB  Neck-Supple Neck,No JVD,.  Lungs-fair air movement, no wheezing CV- S1, S2 normal Abd-  +ve B.Sounds, Abd Soft, No tenderness, increased truncal adiposity , no CVA area tenderness Extremity/Skin:- No  edema,   good pulses Psych-affect is anxious, oriented x3 Neuro-generalized weakness, no new focal deficits, no tremors GU-Foley removed 04/10/2020   Data Reviewed: I have personally reviewed following labs and imaging studies  CBC: Recent Labs  Lab 04/04/20 0520 04/06/20 0447 04/07/20 0936 04/08/20 0317 04/09/20 0440 04/10/20 0448  WBC 4.3 6.7 4.9 4.3 4.6 5.9  NEUTROABS 3.5  --   --   --   --   --   HGB 12.1 11.7* 10.7* 9.7* 10.0* 10.5*  HCT 37.8  36.2 33.3* 30.4* 32.7* 34.3*  MCV 88.1 86.8 89.5 89.4 92.9 92.2  PLT 124* 138* 88* 93* 82* 107*    Basic Metabolic Panel: Recent Labs  Lab 04/06/20 0447 04/07/20 0936 04/08/20 0317 04/09/20 0440 04/10/20 0448  NA 135 137 137 137 139  K 4.0 2.9* 3.6 3.7 3.6  CL 108 109 109 110 108  CO2 19* 21* 21* 19* 25  GLUCOSE 173* 109* 193* 198* 149*  BUN 31* 24* 17 19 26*  CREATININE 1.09* 1.03* 0.95 0.77 0.82  CALCIUM 8.2* 7.9* 7.9* 8.1* 8.3*  MG  --  2.1  --   --   --     GFR: Estimated Creatinine Clearance: 118.9 mL/min (by C-G formula based on SCr of 0.82 mg/dL).  Liver Function Tests: Recent Labs  Lab 04/04/20 0520 04/06/20 0447 04/07/20 0936 04/09/20 0440  AST 47* 72* 91* 63*  ALT 55* 41 37 38  ALKPHOS 75 77 69 61  BILITOT 0.8 0.6 0.7 0.5  PROT 5.8* 5.9* 5.1* 5.2*  ALBUMIN 2.9* 2.8* 2.4* 2.3*    CBG: Recent Labs  Lab 04/09/20 1242 04/09/20 1627 04/09/20 2025 04/10/20 0739 04/10/20 1125  GLUCAP 155* 163* 139* 109* 90     No results found for this or any  previous visit (from the past 240 hour(s)).  Radiology Studies: CT ANGIO CHEST PE W OR WO CONTRAST  Result Date: 04/09/2020 CLINICAL DATA:  Acute hypoxic respiratory failure, COVID-19 positive EXAM: CT ANGIOGRAPHY CHEST WITH CONTRAST TECHNIQUE: Multidetector CT imaging of the chest was performed using the standard protocol during bolus administration of intravenous contrast. Multiplanar CT image reconstructions and MIPs were obtained to evaluate the vascular anatomy. CONTRAST:  125m OMNIPAQUE IOHEXOL 350 MG/ML SOLN COMPARISON:  04/07/2020 FINDINGS: Cardiovascular: This is a technically adequate evaluation of the pulmonary vasculature. Filling defects are seen within the right lower lobe segmental pulmonary arteries, consistent with acute pulmonary emboli. Overall, clot burden is minimal, with no evidence of right ventricular dilatation or right heart strain. The heart is unremarkable without pericardial effusion. No  evidence of thoracic aortic aneurysm or dissection. Left-sided central venous catheter tip within the superior vena cava. Mediastinum/Nodes: No enlarged mediastinal, hilar, or axillary lymph nodes. Thyroid gland, trachea, and esophagus demonstrate no significant findings. Lungs/Pleura: There has been interval progression of bilateral ground-glass airspace disease, now with both lungs diffusely involved. This could reflect acute worsening of known COVID-19 pneumonia, developing ARDS, or superimposed edema. No effusion or pneumothorax. Central airways are patent. Upper Abdomen: No acute abnormality. Musculoskeletal: No acute or destructive bony lesions. Reconstructed images demonstrate no additional findings. Review of the MIP images confirms the above findings. IMPRESSION: 1. Segmental right lower lobe pulmonary emboli. No evidence of right heart strain. 2. Interval progression of bilateral ground-glass airspace disease, now diffusely involving the bilateral lungs. This could reflect acute worsening of known COVID-19 pneumonia, developing ARDS, or superimposed edema. Critical Value/emergent results were called by telephone at the time of interpretation on 04/09/2020 at 5:08 pm [facility] who verbally acknowledged these results. Electronically Signed   By: MRanda NgoM.D.   On: 04/09/2020 17:09   Scheduled Meds: . (feeding supplement) PROSource Plus  30 mL Oral TID BM  . acetaZOLAMIDE  250 mg Oral BID  . acyclovir  200 mg Oral Daily  . vitamin C  500 mg Oral Daily  . azelastine  2 spray Each Nare Daily  . baricitinib  4 mg Oral Daily  . cholecalciferol  25 mcg Oral Daily  . enoxaparin (LOVENOX) injection  120 mg Subcutaneous Q12H  . feeding supplement  237 mL Oral BID BM  . feeding supplement (NEPRO CARB STEADY)  237 mL Oral TID BM  . FLUoxetine  40 mg Oral Daily  . fluticasone furoate-vilanterol  1 puff Inhalation Daily  . furosemide  20 mg Intravenous Q12H  . insulin aspart   0-15 Units Subcutaneous TID WC  . insulin aspart  0-5 Units Subcutaneous QHS  . insulin detemir  14 Units Subcutaneous BID  . loratadine  10 mg Oral Daily  . methocarbamol  500 mg Oral TID  . methylPREDNISolone (SOLU-MEDROL) injection  60 mg Intravenous Q12H  . nystatin  5 mL Oral QID  . pantoprazole  40 mg Oral BID  . pregabalin  150 mg Oral QHS  . saccharomyces boulardii  250 mg Oral BID  . zinc sulfate  220 mg Oral Daily   Continuous Infusions: . cefTRIAXone (ROCEPHIN)  IV      LOS: 11 days   CRoxan Hockey MD [facility]   To contact the attending provider between 7A-7P or the covering provider during after hours 7P-7A, please log into the web site [email or web address] and access using universal [place] password for that web site. If you do  not have the password, please call the hospital operator.  04/10/2020, 1:36 PM

## 2020-04-10 NOTE — Consult Note (Signed)
NAME:  Sandra [name], MRN:  [id number], DOB:  22-Dec-1975, LOS: 11 ADMISSION DATE:  04/08/2020, CONSULTATION DATE:  04/10/2020  REFERRING MD:  TRH, courage, CHIEF COMPLAINT: Severe hypoxia  Brief History:  45 year old woman, unvaccinated admitted 2/4 with Covid pneumonia, progressed to hypoxic respiratory failure, on heated high flow nasal cannula since 2/10.  PCCM consulted for management of severe refractory hypoxia  History of Present Illness:  45 year old unvaccinated woman admitted 2/4 with hypoxia and Covid pneumonia, bilateral lung disease demonstrated on CT scan.  Required high flow nasal cannula.  Has been treated with usual therapy including remdesivir, steroids and baricitinib, progressed to heated high flow and required nonrebreather in addition.  CT angiogram chest on 2/14 showed segmental right lower lobe pulmonary emboli, converted to therapeutic full dose Lovenox. PCCM consulted to assist with refractory hypoxia.  She has tolerated intermittent BiPAP  Past Medical History:  Pseudotumor cerebri status post VP shunt Anxiety, depression POTS with episodes of tachycardia and hypotension  Significant Hospital Events:    Consults:    Procedures:    Significant Diagnostic Tests:  CT angiogram chest 2/4 extensive bilateral airspace disease. CT angiogram chest 2/14 segmental right lower lobe pulmonary emboli , progression of bilateral groundglass airspace disease Venous duplex 2/12 - bilateral neg Echo normal LV function, normal RV pressures  Micro Data:  MRSA PCR negative BC 2/4 ng  Antimicrobials:  Remdesivir 2/4 >>2/8  Interim History / Subjective:    Objective   Blood pressure 126/68, pulse 71, temperature (!) 101.5 F (38.6 C), temperature source Axillary, resp. rate (!) 25, height 5\' 7"  (1.702 m), weight 124.9 kg, SpO2 (!) 87 %.    Vent Mode: BIPAP FiO2 (%):  [100 %] 100 % Set Rate:  [18 bmp] 18 bmp PEEP:  [5 cmH20] 5 cmH20   Intake/Output Summary (Last  24 hours) at 04/10/2020 1411 Last data filed at 04/10/2020 04/12/2020 Gross per 24 hour  Intake 527.45 ml  Output 2875 ml  Net -2347.55 ml   Filed Weights   04/05/20 0410 04/06/20 0432 04/10/20 0622  Weight: 117.7 kg 117 kg 124.9 kg    Examination: General: Middle-aged woman lying left lateral decubitus in bed, no distress, on heated high flow plus nonrebreather HENT: Mild pallor, no icterus, no JVD Lungs: Bilateral scattered crackles, no rhonchi, no accessory muscle use Cardiovascular: S1-S2 regular, no murmur Abdomen: Soft, nontender, no organomegaly Extremities: No deformity, no edema Neuro: Alert, interactive, nonfocal   Resolved Hospital Problem list     Assessment & Plan:  Acute hypoxic respiratory failure due to COVID pneumonia  -Her work of breathing is low "happy hypoxic" , she continues to require 20 L / 100% heated high flow , tolerates intermittent BiPAP -Continue baricitinib for 14 days. -Would continue Solu-Medrol at current doses while she remains severely hypoxic -Attempt to dial down FiO2 on heated high flow daily -Encourage self proning, sitting up position as much as possible  Acute segmental PE -appears to be in situ thrombus rather than embolic due to Covid -Agree with full dose Lovenox and monitor platelets  Mildly elevated LFTs -since admit so not related to remdesivir -Follow  Best practice (evaluated daily)  Diet: Full Pain/Anxiety/Delirium protocol (if indicated): N/A VAP protocol (if indicated): N/A DVT prophylaxis: Full dose Lovenox GI prophylaxis: N/A Glucose control: SSI Mobility: Out of bed as tolerated Disposition: ICU  Goals of Care:   Code Status: Full  Labs   CBC: Recent Labs  Lab 04/04/20 0520 04/06/20 0447 04/07/20 0936 04/08/20 0317  04/09/20 0440 04/10/20 0448  WBC 4.3 6.7 4.9 4.3 4.6 5.9  NEUTROABS 3.5  --   --   --   --   --   HGB 12.1 11.7* 10.7* 9.7* 10.0* 10.5*  HCT 37.8 36.2 33.3* 30.4* 32.7* 34.3*  MCV 88.1 86.8  89.5 89.4 92.9 92.2  PLT 124* 138* 88* 93* 82* 107*    Basic Metabolic Panel: Recent Labs  Lab 04/06/20 0447 04/07/20 0936 04/08/20 0317 04/09/20 0440 04/10/20 0448  NA 135 137 137 137 139  K 4.0 2.9* 3.6 3.7 3.6  CL 108 109 109 110 108  CO2 19* 21* 21* 19* 25  GLUCOSE 173* 109* 193* 198* 149*  BUN 31* 24* 17 19 26*  CREATININE 1.09* 1.03* 0.95 0.77 0.82  CALCIUM 8.2* 7.9* 7.9* 8.1* 8.3*  MG  --  2.1  --   --   --    GFR: Estimated Creatinine Clearance: 118.9 mL/min (by C-G formula based on SCr of 0.82 mg/dL). Recent Labs  Lab 04/07/20 0936 04/08/20 0317 04/09/20 0440 04/10/20 0448  WBC 4.9 4.3 4.6 5.9    Liver Function Tests: Recent Labs  Lab 04/04/20 0520 04/06/20 0447 04/07/20 0936 04/09/20 0440  AST 47* 72* 91* 63*  ALT 55* 41 37 38  ALKPHOS 75 77 69 61  BILITOT 0.8 0.6 0.7 0.5  PROT 5.8* 5.9* 5.1* 5.2*  ALBUMIN 2.9* 2.8* 2.4* 2.3*   No results for input(s): LIPASE, AMYLASE in the last 168 hours. No results for input(s): AMMONIA in the last 168 hours.  ABG No results found for: PHART, PCO2ART, PO2ART, HCO3, TCO2, ACIDBASEDEF, O2SAT   Coagulation Profile: Recent Labs  Lab 04/10/20 0448  INR 1.2    Cardiac Enzymes: No results for input(s): CKTOTAL, CKMB, CKMBINDEX, TROPONINI in the last 168 hours.  HbA1C: Hgb A1c MFr Bld  Date/Time Value Ref Range Status  04/02/2020 05:32 AM 6.6 (H) 4.8 - 5.6 % Final    Comment:    (NOTE) Pre diabetes:          5.7%-6.4%  Diabetes:              >6.4%  Glycemic control for   <7.0% adults with diabetes     CBG: Recent Labs  Lab 04/09/20 1242 04/09/20 1627 04/09/20 2025 04/10/20 0739 04/10/20 1125  GLUCAP 155* 163* 139* 109* 90    Review of Systems:   Shortness of breath with activity Cough, mostly dry, occasional blood-tinged Palpitations  Poor appetite Loss of taste  Past Medical History:  She,  has a past medical history of Anxiety, Arthritis, Chronic back pain, Chronic migraine,  Constipation, Depression, Fibromyalgia, GERD (gastroesophageal reflux disease), History of bronchitis, History of cardiac catheterization, History of gastric ulcer, History of hiatal hernia, History of pneumonia, Hypertension, Idiopathic intracranial hypertension, POTS (postural orthostatic tachycardia syndrome), and Seasonal allergies.   Surgical History:   Past Surgical History:  Procedure Laterality Date  . ABDOMINAL HYSTERECTOMY    . BACK SURGERY    . BLADDER SURGERY    . BREAST SURGERY Left    Lumpectomy  . CARDIAC CATHETERIZATION  2010  . CHOLECYSTECTOMY    . COLONOSCOPY  2003   Moderate external hemorrhoids with normal TI  . CYSTOSCOPY W/ URETERAL STENT REMOVAL    . ESOPHAGEAL DILATION     4-5 times  . ESOPHAGOGASTRODUODENOSCOPY  2003   Small sliding hiatal hernia, erosive gastritis  . ESOPHAGOGASTRODUODENOSCOPY  2004   Erosive reflux esophagitis with Schatzki ring s/p dilation, erosive  antral gastritis  . ESOPHAGOGASTRODUODENOSCOPY  2006   Non-critical Schatzki ring proximal to GEJ, s/p 54 and 56 Maloney dilatation. ?Candida esophagitis  . ESOPHAGOGASTRODUODENOSCOPY  2009   Small hiatal hernia  . LUMBAR LAMINECTOMY/DECOMPRESSION MICRODISCECTOMY Left 01/11/2014   Procedure: LUMBAR LAMINECTOMY/DECOMPRESSION MICRODISCECTOMY 1 LEVEL LEFT LUMBAR TWO/THREE;  Surgeon: Tressie StalkerJeffrey Jenkins, MD;  Location: [facility];  Service: Neurosurgery;  Laterality: Left;  . NASAL SEPTOPLASTY W/ TURBINOPLASTY Bilateral 09/09/2019   Procedure: NASAL SEPTOPLASTY WITH BILATERAL TURBINATE REDUCTION;  Surgeon: Newman Pieseoh, Su, MD;  Location: [facility];  Service: ENT;  Laterality: Bilateral;  . TUBAL LIGATION    . ventrical atrium shunt  04/2018  . VENTRICULOPERITONEAL SHUNT Right 11/19/2015   Procedure: VENTRICULAR-PERITONEAL SHUNT RIGHT;  Surgeon: Tressie StalkerJeffrey Jenkins, MD;  Location: [facility];  Service: Neurosurgery;  Laterality: Right;  Right     Social History:   reports that she has never  smoked. She has never used smokeless tobacco. She reports that she does not drink alcohol and does not use drugs.   Family History:  Her family history includes Aortic aneurysm in an other family member; Coronary artery disease in her father; Diabetes in her mother; Hypertension in her father and mother; Kidney cancer in her mother; Lung cancer in her paternal grandmother; Pancreatic cancer in her maternal grandmother; Prostate cancer in her maternal grandfather; Thyroid disease in her mother; Ulcers in her father. There is no history of Colon cancer.   Allergies Allergies  Allergen Reactions  . Ambien [Zolpidem Tartrate] Rash and Other (See Comments)    Pt states she cannot walk after taking this medication  . Penicillins Hives and Swelling    Has patient had a PCN reaction causing immediate rash, facial/tongue/throat swelling, SOB or lightheadedness with hypotension: Yes Has patient had a PCN reaction causing severe rash involving mucus membranes or skin necrosis: Yes Has patient had a PCN reaction occurring within the last 10 years: Yes If all of the above answers are "NO", then may proceed with Cephalosporin use. Pt had ceftriaxone and ceftazidime 01/2014 and 2019  . Topamax [Topiramate] Other (See Comments)    Gives her kidney stones  . Hydrocodone Itching  . Chlorhexidine Gluconate Itching and Rash    "It felt bees were stinging me"  . Latex Rash  . Versed [Midazolam] Itching     Home Medications  Prior to Admission medications   Medication Sig Start Date End Date Taking? Authorizing Provider  acetaminophen (TYLENOL) 500 MG tablet Take 1,000 mg by mouth every 6 (six) hours as needed.   Yes [provider]  acetaZOLAMIDE (DIAMOX) 250 MG tablet Take 250 mg by mouth 2 (two) times daily. 11/18/19  Yes [provider]  acyclovir (ZOVIRAX) 200 MG capsule Take 200 mg by mouth daily. 03/09/20  Yes [provider]  albuterol (VENTOLIN HFA) 108 (90 Base) MCG/ACT  inhaler Inhale into the lungs every 6 (six) hours as needed for wheezing or shortness of breath.   Yes [provider]  Ascorbic Acid (VITAMIN C) 1000 MG tablet Take 2,000 mg by mouth daily.   Yes [provider]  atenolol (TENORMIN) 25 MG tablet Take 1 tablet (25 mg total) by mouth daily. 01/28/19 09/02/19 Yes Jonelle SidleMcDowell, Samuel G, MD  Azelastine HCl 137 MCG/SPRAY SOLN Place 2 puffs into both nostrils daily. 01/13/19  Yes [provider]  BREO ELLIPTA 100-25 MCG/INH AEPB Inhale 1 puff into the lungs daily. 01/13/19  Yes [provider]  butalbital-acetaminophen-caffeine (FIORICET, ESGIC) 50-325-40 MG  tablet Take 1 tablet by mouth every 6 (six) hours as needed for headache. 04/06/15  Yes Vassie Loll, MD  diazepam (VALIUM) 5 MG tablet Take 5 mg by mouth every 6 (six) hours as needed for anxiety.   Yes [provider]  diphenhydrAMINE (BENADRYL) 25 mg capsule Take 25 mg by mouth every 6 (six) hours as needed for itching.   Yes [provider]  FLUoxetine (PROZAC) 40 MG capsule Take 40 mg by mouth daily. 03/04/20  Yes [provider]  furosemide (LASIX) 20 MG tablet Take by mouth. 12/08/18  Yes [provider]  hydrochlorothiazide (HYDRODIURIL) 12.5 MG tablet Take 12.5 mg by mouth daily.  06/08/17  Yes [provider]  levocetirizine (XYZAL) 5 MG tablet Take 1 tablet by mouth daily. 06/21/19  Yes [provider]  meclizine (ANTIVERT) 25 MG tablet Take 1 tablet (25 mg total) by mouth 3 (three) times daily as needed for dizziness. 08/11/14  Yes Anson Fret, MD  Multiple Vitamins-Minerals (EMERGEN-C IMMUNE PO) Take 1 packet by mouth daily.   Yes [provider]  pantoprazole (PROTONIX) 40 MG tablet Take 1 tablet (40 mg total) by mouth 2 (two) times daily. 12/20/18  Yes Ermalinda Memos S, PA-C  pravastatin (PRAVACHOL) 40 MG tablet Take 40 mg by mouth daily.   Yes [provider]  predniSONE (STERAPRED  UNI-PAK 21 TAB) 5 MG (21) TBPK tablet See admin instructions. 03/26/20  Yes [provider]  pregabalin (LYRICA) 300 MG capsule Take 150 mg by mouth at bedtime.    Yes [provider]  promethazine (PHENERGAN) 25 MG tablet Take 25 mg by mouth every 6 (six) hours as needed for nausea or vomiting.   Yes [provider]  rizatriptan (MAXALT-MLT) 5 MG disintegrating tablet Take 5 mg by mouth daily as needed for migraine. May repeat in 2 hours if needed   Yes [provider]  Vitamin D, Cholecalciferol, 25 MCG (1000 UT) TABS Take 1 tablet by mouth daily as needed.   Yes [provider]  zinc gluconate 50 MG tablet Take 50 mg by mouth daily.   Yes [provider]  azithromycin (ZITHROMAX) 250 MG tablet Take by mouth. Patient not taking: No sig reported 03/26/20   [provider]  benzonatate (TESSALON) 100 MG capsule Take 200 mg by mouth every 8 (eight) hours as needed. 03/26/20   [provider]  doxycycline (ADOXA) 100 MG tablet Take 100 mg by mouth 2 (two) times daily. Patient not taking: No sig reported 01/24/20   [provider]  fluconazole (DIFLUCAN) 150 MG tablet Take 150 mg by mouth once. Patient not taking: No sig reported 10/13/19   [provider]  HYDROcodone-acetaminophen (NORCO/VICODIN) 5-325 MG tablet Take by mouth. Patient not taking: No sig reported 07/04/19   [provider]  hydrocortisone (ANUSOL-HC) 2.5 % rectal cream Place 1 application rectally 2 (two) times daily. Patient not taking: No sig reported 03/17/19   Letta Median, PA-C  levofloxacin (LEVAQUIN) 500 MG tablet Take 500 mg by mouth daily. Patient not taking: No sig reported 10/11/19   [provider]  lubiprostone (AMITIZA) 8 MCG capsule Take 1 capsule (8 mcg total) by mouth 2 (two) times daily with a meal. Patient not taking: No sig reported 12/20/18   Letta Median, PA-C  nystatin (MYCOSTATIN) 100000 UNIT/ML  suspension Take by mouth. Patient not taking: No sig reported 01/06/20   [provider]  tetrahydrozoline 0.05 % ophthalmic solution Place  1 drop into both eyes as needed (For allergies). Patient not taking: Reported on 04/01/2020    [provider]  tiZANidine (ZANAFLEX) 2 MG tablet Take 2 mg by mouth 3 (three) times daily. Patient not taking: No sig reported 11/18/19   [provider]     Cyril Mourning MD. FCCP. [facility] Pager : [phone]  If no response to pager , please call [phone] until 7 pm After 7:00 pm call Elink  [phone]   04/10/2020

## 2020-04-11 ENCOUNTER — Inpatient Hospital Stay (HOSPITAL_COMMUNITY): Payer: Medicare Other

## 2020-04-11 DIAGNOSIS — J9601 Acute respiratory failure with hypoxia: Secondary | ICD-10-CM | POA: Diagnosis not present

## 2020-04-11 DIAGNOSIS — U071 COVID-19: Secondary | ICD-10-CM | POA: Diagnosis not present

## 2020-04-11 DIAGNOSIS — J1282 Pneumonia due to coronavirus disease 2019: Secondary | ICD-10-CM | POA: Diagnosis not present

## 2020-04-11 LAB — GLUCOSE, CAPILLARY
Glucose-Capillary: 100 mg/dL — ABNORMAL HIGH (ref 70–99)
Glucose-Capillary: 89 mg/dL (ref 70–99)
Glucose-Capillary: 131 mg/dL — ABNORMAL HIGH (ref 70–99)
Glucose-Capillary: 117 mg/dL — ABNORMAL HIGH (ref 70–99)

## 2020-04-11 LAB — CBC
HCT: 32.3 % — ABNORMAL LOW (ref 36.0–46.0)
Hemoglobin: 10.1 g/dL — ABNORMAL LOW (ref 12.0–15.0)
MCH: 28.1 pg (ref 26.0–34.0)
MCHC: 31.3 g/dL (ref 30.0–36.0)
MCV: 90 fL (ref 80.0–100.0)
Platelets: 98 10*3/uL — ABNORMAL LOW (ref 150–400)
RBC: 3.59 MIL/uL — ABNORMAL LOW (ref 3.87–5.11)
RDW: 14.9 % (ref 11.5–15.5)
WBC: 5.4 10*3/uL (ref 4.0–10.5)
nRBC: 0 % (ref 0.0–0.2)

## 2020-04-11 MED ORDER — ENOXAPARIN SODIUM 120 MG/0.8ML ~~LOC~~ SOLN
120.0000 mg | Freq: Two times a day (BID) | SUBCUTANEOUS | Status: DC
  Administered 2020-04-11 – 2020-04-21 (×21): 120 mg via SUBCUTANEOUS
  Filled 2020-04-11 (×25): qty 0.8

## 2020-04-11 NOTE — Progress Notes (Signed)
Physical Therapy Treatment Patient Details Name: Sandra [name] MRN: [id number] DOB: Jul 28, 1975 Today's Date: 04/11/2020    History of Present Illness Sandra [name]  is a 45 y.o. female, nickel history of GERD,vertigo, depression, anxiety, pseudotumor cerebri, benign intracranial hypertension, with VP shunt, patient is unvaccinated against Covid, she presents with shortness of breath, cough, patient diagnosed with COVID-19 7 days ago with a home kit, she reports cough, shortness of breath, fatigue and body ache, but her symptoms continue to worsen, report dyspnea has worsened over the last 2 to 3 days, as well he reports chest pain related to deep breathing, and coughing, prompted her to come to ED.  - in ED patient saturation 73% on room air, she is currently on 15 L high flow nasal cannula, CTA chest negative for PE, but significant for severe COVID-19 of pneumonia, creatinine elevated at 1.5, AST elevated at 113, [facility] hospitalist consulted to admit.    PT Comments    Patient demonstrates slightly increased tolerance for taking steps at bedside while on BIPAP with SpO2 between 87-91%, demonstrates good return for completing BLE exercises while seated at bedside and tolerated sitting up in chair after therapy - RN notified.  Patient will benefit from continued physical therapy in hospital and recommended venue below to increase strength, balance, endurance for safe ADLs and gait.    Follow Up Recommendations  Home health PT;Supervision for mobility/OOB;Supervision - Intermittent     Equipment Recommendations  None recommended by PT    Recommendations for Other Services       Precautions / Restrictions Precautions Precautions: Fall Restrictions Weight Bearing Restrictions: No    Mobility  Bed Mobility Overal bed mobility: Modified Independent Bed Mobility: Supine to Sit           General bed mobility comments: HOB raised, increased time    Transfers Overall transfer level:  Needs assistance Equipment used: Rolling walker (2 wheeled) Transfers: Sit to/from [facility] Sit to Stand: Supervision;Min guard Stand pivot transfers: Supervision;Min guard       General transfer comment: increased time, labored movement  Ambulation/Gait Ambulation/Gait assistance: Min guard;Min assist Gait Distance (Feet): 12 Feet Assistive device: Rolling walker (2 wheeled) Gait Pattern/deviations: Decreased step length - right;Decreased step length - left;Decreased stride length Gait velocity: decreased   General Gait Details: tolerated marching in place for up to 10-12 steps before having to sit due to c/o fatigue, on BIPAP with SpO2 at 87-91%   Stairs             Wheelchair Mobility    Modified Rankin (Stroke Patients Only)       Balance Overall balance assessment: Needs assistance Sitting-balance support: Feet supported;No upper extremity supported Sitting balance-Leahy Scale: Good Sitting balance - Comments: seated at EOB   Standing balance support: During functional activity;Bilateral upper extremity supported Standing balance-Leahy Scale: Fair Standing balance comment: fair/good using RW                            Cognition Arousal/Alertness: Awake/alert Behavior During Therapy: WFL for tasks assessed/performed Overall Cognitive Status: Within Functional Limits for tasks assessed                                        Exercises General Exercises - Lower Extremity Long Arc Quad: Seated;AROM;Strengthening;Both;10 reps Hip Flexion/Marching: Seated;AROM;Strengthening;Both;10 reps Toe Raises: Seated;AROM;Strengthening;10 reps;Both Heel  Raises: Seated;AROM;Strengthening;Both;10 reps    General Comments        Pertinent Vitals/Pain Pain Assessment: No/denies pain    Home Living                      Prior Function            PT Goals (current goals can now be found in the care plan  section) Acute Rehab PT Goals Patient Stated Goal: return home with family to assist PT Goal Formulation: With patient Time For Goal Achievement: 04/16/20 Potential to Achieve Goals: Good Progress towards PT goals: Progressing toward goals    Frequency    Min 3X/week      PT Plan Current plan remains appropriate    Co-evaluation              AM-PAC PT "6 Clicks" Mobility   Outcome Measure  Help needed turning from your back to your side while in a flat bed without using bedrails?: None Help needed moving from lying on your back to sitting on the side of a flat bed without using bedrails?: None Help needed moving to and from a bed to a chair (including a wheelchair)?: A Little Help needed standing up from a chair using your arms (e.g., wheelchair or bedside chair)?: A Little Help needed to walk in hospital room?: A Little Help needed climbing 3-5 steps with a railing? : A Lot 6 Click Score: 19    End of Session Equipment Utilized During Treatment: Oxygen Activity Tolerance: Patient tolerated treatment well;Patient limited by fatigue Patient left: in chair;with call bell/phone within reach Nurse Communication: Mobility status PT Visit Diagnosis: Unsteadiness on feet (R26.81);Other abnormalities of gait and mobility (R26.89);Muscle weakness (generalized) (M62.81)     Time: 0981-1914 PT Time Calculation (min) (ACUTE ONLY): 31 min  Charges:  $Therapeutic Exercise: 8-22 mins $Therapeutic Activity: 8-22 mins                     11:16 AM, 04/11/20 Lonell Grandchild, MPT Physical Therapist with [facility] [phone] office [phone] mobile phone

## 2020-04-11 NOTE — Progress Notes (Addendum)
PROGRESS NOTE    Sandra [name]  BXI:[id number] DOB: 1975/09/04 DOA: 03/29/2020 PCP: Glenda Chroman, MD   Chief Complaint  Patient presents with  . Shortness of Breath    Brief admission Narrative:  Sandra [name]  is a 45 y.o. female, nickel history of GERD,vertigo, depression, anxiety, pseudotumor cerebri, benign intracranial hypertension, with VP shunt, patient is unvaccinated against Covid, admitted on 04/10/2020 with acute hypoxic respiratory failure with O2 sats down to 73% on room air on admission---and found to have Covid 19 respiratory infection/pneumonia-  CTA chest negative for PE on admission--- and she required high flow nasal cannula.  Assessment & Plan:  1) Acute Respiratory Failure with Hypoxia secondary to COVID-19 Pneumonia --Continue Treatment with iv Steroids and Baricitinib. -Patient has been encourage to participate with proning position, incentive spirometer and flutter valve. -Completed Remdesivir already -Continue as needed antitussive medications, vitamin C and zinc. --Encourage BiPAP use nightly   Continue also to use Robaxin and prn oxycodone as needed.  04/11/20 -Continue to have profound hypoxia, desaturates very quickly with attempts to take her off BiPAP -Currently on BiPAP with 100% FiO2 -CXR on 04/11/20--with worsening Covid pneumonia findings -CTA chest on 04/09/2020 with finding of pulmonary embolism and concern for possible ARDS versus edema -  2)Acute PE--- therapeutic Lovenox as ordered, watch platelets closely --LE Venous Doppler w/o acute findings Echo with preserved EF and largely unremarkable, no right heart strain  3)ARDS Vs Pulm Edema--IV Lasix as ordered -Diuresing well, BNP 272   PCCM consult appreciated -Profound hypoxia persist -Echo with preserved EF  4)Episodes of Transient Tachycardia and Transient Hypotension--- patient has longstanding history of POTS--- Improved with IVFs -Avoid further IV fluids due to concerns for volume  overload  5)Possible UTI ----T max was 101.5 , no further fevers---,  -Foley catheter removed 04/10/2020 -Continue IV Rocephin pending urine and blood culture results -Patient had Foley catheter initially due to hemodynamic instability with hypotension and tachycardia requiring aggressive fluid boluses in the setting of severe COVID-19 infection with profound persistent hypoxia making it difficult for patient to get out of bed and need for accurate input and output monitoring  6)-Depression/Anxiety --Continue Prozac, continue Xanax as needed -  7)-GERD (gastroesophageal reflux disease) -Continue PPI.  8)-Pseudotumor cerebri -Continue Diamox -Patient denies headaches currently.  9)-acute kidney injury -In the setting of prerenal azotemia, dehydration and transient hypotension -Stopped HCTZ  --- renally adjust medications, avoid nephrotoxic agents / dehydration  / hypotension -Renal function normalized -Renal function closely with reinitiation of IV Lasix  10)-mild transaminitis ; most likely in the setting of acute COVID infection -Holding statins currently. -Alk phos and T bili WNL Hepatic Function Latest Ref Rng & Units 04/09/2020 04/07/2020 04/06/2020  Total Protein 6.5 - 8.1 g/dL 5.2(L) 5.1(L) 5.9(L)  Albumin 3.5 - 5.0 g/dL 2.3(L) 2.4(L) 2.8(L)  AST 15 - 41 U/L 63(H) 91(H) 72(H)  ALT 0 - 44 U/L 38 37 41  Alk Phosphatase 38 - 126 U/L 61 69 77  Total Bilirubin 0.3 - 1.2 mg/dL 0.5 0.7 0.6     11-history of migraines -Continue as needed Fioricet and rizatriptan.  12-history of COPD -Continue   inhaler regimen. -Steroids treatment as  above,   13-type 2 diabetes with hyperglycemia -No prior history of diabetes or using hypoglycemic agents prior to admission -Steroids therapy causing hyperglycemia -Continue sliding scale insulin and adjusted dose of Levemir -Continue to follow CBGs and adjust hypoglycemic regimen as required.  14)-Morbid obesity (HCC) -Body mass index is  40.4  kg/m. --With concerns for obesity hypoventilation syndrome, sleep apnea;  ----Bipap qhs advised--  15)Thrombocytopenia/Anemia--- platelets currently around 100k, ---hemoglobin currently close to 10 -No obvious  bleeding noted - watch closely especially while on Lovenox   DVT prophylaxis:Lovenox Code Status: Full code. Family Communication: Patient's daughter- Herbert Spires Disposition:   Status is: Inpatient  Dispo: The patient is from: Home              Anticipated d/c is to: ?? LTAC Vs SNF              Anticipated d/c date is: To be determined              Patient currently no medically stable for discharge; still requiring significant amount of oxygen supplementationcontinue treatment with IV steroids, and baricitinib.  Using BiPAP nightly.   Difficult to place patient: No  Consultants:   None   Procedures:  See below for x-ray reports.  Antimicrobials/antiviral Remdesivir 5 out of 5  Subjective:  04/11/20 -Having more respiratory difficulty unable to come off BiPAP -Called and updated patient's daughter Herbert Spires -No further fevers, no chills  Objective: Vitals:   04/11/20 1055 04/11/20 1100 04/11/20 1200 04/11/20 1300  BP:  113/66 97/71 (!) 112/46  Pulse: 82 85 (!) 104 95  Resp: (!) 22 (!) 26 (!) 29 (!) 21  Temp: 99.5 F (37.5 C)     TempSrc: Axillary     SpO2: 98% 99% 97% 95%  Weight:      Height:        Intake/Output Summary (Last 24 hours) at 04/11/2020 1355 Last data filed at 04/11/2020 0358 Gross per 24 hour  Intake 232.86 ml  Output 2430 ml  Net -2197.14 ml   Filed Weights   04/06/20 0432 04/10/20 0622 04/11/20 0357  Weight: 117 kg 124.9 kg 127.9 kg    Examination:  Physical Exam Gen:-Looks tired, conversational dyspnea HEENT:- [place].AT, No sclera icterus Nose:-BiPAP Neck-Supple Neck,No JVD,.  Lungs-diminished patient with bibasilar rales  CV- S1, S2 normal Abd-  +ve B.Sounds, Abd Soft, No tenderness, increased truncal adiposity , no CVA area  tenderness Extremity/Skin:- No  edema,   good pulses Psych-affect is anxious, oriented x3 Neuro-generalized weakness, no new focal deficits, no tremors   Data Reviewed: I have personally reviewed following labs and imaging studies  CBC: Recent Labs  Lab 04/07/20 0936 04/08/20 0317 04/09/20 0440 04/10/20 0448 04/11/20 0548  WBC 4.9 4.3 4.6 5.9 5.4  HGB 10.7* 9.7* 10.0* 10.5* 10.1*  HCT 33.3* 30.4* 32.7* 34.3* 32.3*  MCV 89.5 89.4 92.9 92.2 90.0  PLT 88* 93* 82* 107* 98*    Basic Metabolic Panel: Recent Labs  Lab 04/06/20 0447 04/07/20 0936 04/08/20 0317 04/09/20 0440 04/10/20 0448  NA 135 137 137 137 139  K 4.0 2.9* 3.6 3.7 3.6  CL 108 109 109 110 108  CO2 19* 21* 21* 19* 25  GLUCOSE 173* 109* 193* 198* 149*  BUN 31* 24* 17 19 26*  CREATININE 1.09* 1.03* 0.95 0.77 0.82  CALCIUM 8.2* 7.9* 7.9* 8.1* 8.3*  MG  --  2.1  --   --   --     GFR: Estimated Creatinine Clearance: 120.5 mL/min (by C-G formula based on SCr of 0.82 mg/dL).  Liver Function Tests: Recent Labs  Lab 04/06/20 0447 04/07/20 0936 04/09/20 0440  AST 72* 91* 63*  ALT 41 37 38  ALKPHOS 77 69 61  BILITOT 0.6 0.7 0.5  PROT 5.9* 5.1* 5.2*  ALBUMIN 2.8* 2.4*  2.3*    CBG: Recent Labs  Lab 04/10/20 1125 04/10/20 1554 04/10/20 2029 04/11/20 0731 04/11/20 1054  GLUCAP 90 136* 118* 100* 89     Recent Results (from the past 240 hour(s))  Culture, blood (Routine X 2) w Reflex to ID Panel     Status: None (Preliminary result)   Collection Time: 04/10/20  9:16 PM   Specimen: BLOOD LEFT HAND  Result Value Ref Range Status   Specimen Description BLOOD LEFT HAND  Final   Special Requests   Final    BOTTLES DRAWN AEROBIC AND ANAEROBIC Blood Culture adequate volume   Culture   Final    NO GROWTH < 12 HOURS Performed at [facility], [address], [place], [place] [address]    Report Status PENDING  Incomplete  Culture, blood (Routine X 2) w Reflex to ID Panel     Status: None (Preliminary  result)   Collection Time: 04/10/20  9:18 PM   Specimen: BLOOD RIGHT HAND  Result Value Ref Range Status   Specimen Description BLOOD RIGHT HAND  Final   Special Requests   Final    BOTTLES DRAWN AEROBIC ONLY Blood Culture results may not be optimal due to an inadequate volume of blood received in culture bottles   Culture   Final    NO GROWTH < 12 HOURS Performed at [facility] [place], [address] [place], [place] [address]    Report Status PENDING  Incomplete    Radiology Studies: CT ANGIO CHEST PE W OR WO CONTRAST  Result Date: 04/09/2020 CLINICAL DATA:  Acute hypoxic respiratory failure, COVID-19 positive EXAM: CT ANGIOGRAPHY CHEST WITH CONTRAST TECHNIQUE: Multidetector CT imaging of the chest was performed using the standard protocol during bolus administration of intravenous contrast. Multiplanar CT image reconstructions and MIPs were obtained to evaluate the vascular anatomy. CONTRAST:  115m OMNIPAQUE IOHEXOL 350 MG/ML SOLN COMPARISON:  04/07/2020 FINDINGS: Cardiovascular: This is a technically adequate evaluation of the pulmonary vasculature. Filling defects are seen within the right lower lobe segmental pulmonary arteries, consistent with acute pulmonary emboli. Overall, clot burden is minimal, with no evidence of right ventricular dilatation or right heart strain. The heart is unremarkable without pericardial effusion. No evidence of thoracic aortic aneurysm or dissection. Left-sided central venous catheter tip within the superior vena cava. Mediastinum/Nodes: No enlarged mediastinal, hilar, or axillary lymph nodes. Thyroid gland, trachea, and esophagus demonstrate no significant findings. Lungs/Pleura: There has been interval progression of bilateral ground-glass airspace disease, now with both lungs diffusely involved. This could reflect acute worsening of known COVID-19 pneumonia, developing ARDS, or superimposed edema. No effusion or pneumothorax. Central airways are patent. Upper  Abdomen: No acute abnormality. Musculoskeletal: No acute or destructive bony lesions. Reconstructed images demonstrate no additional findings. Review of the MIP images confirms the above findings. IMPRESSION: 1. Segmental right lower lobe pulmonary emboli. No evidence of right heart strain. 2. Interval progression of bilateral ground-glass airspace disease, now diffusely involving the bilateral lungs. This could reflect acute worsening of known COVID-19 pneumonia, developing ARDS, or superimposed edema. Critical Value/emergent results were called by telephone at the time of interpretation on 04/09/2020 at 5:08 pm [facility] who verbally acknowledged these results. Electronically Signed   By: MRanda NgoM.D.   On: 04/09/2020 17:09   DG CHEST PORT 1 VIEW  Result Date: 04/11/2020 CLINICAL DATA:  Dyspnea, increased shortness of breath, hypertension, COVID-19 positive EXAM: PORTABLE CHEST 1 VIEW COMPARISON:  Portable exam 0956 hours compared to 04/07/2020  FINDINGS: Normal heart size mediastinal contours. LEFT jugular line with tip projecting over SVC. Hazy BILATERAL pulmonary infiltrates in the mid to lower lungs greater on LEFT consistent with multifocal pneumonia and history of COVID-19. No pleural effusion or pneumothorax. IMPRESSION: Increased BILATERAL pulmonary infiltrates consistent with multifocal pneumonia and COVID-19. Electronically Signed   By: Lavonia Dana M.D.   On: 04/11/2020 11:49   Scheduled Meds: . (feeding supplement) PROSource Plus  30 mL Oral TID BM  . acetaZOLAMIDE  250 mg Oral BID  . acyclovir  200 mg Oral Daily  . vitamin C  500 mg Oral Daily  . azelastine  2 spray Each Nare Daily  . baricitinib  4 mg Oral Daily  . cholecalciferol  25 mcg Oral Daily  . enoxaparin (LOVENOX) injection  120 mg Subcutaneous Q12H  . feeding supplement  237 mL Oral BID BM  . feeding supplement (NEPRO CARB STEADY)  237 mL Oral TID BM  . FLUoxetine  40 mg Oral Daily  . fluticasone  furoate-vilanterol  1 puff Inhalation Daily  . furosemide  20 mg Intravenous Q12H  . insulin aspart  0-15 Units Subcutaneous TID WC  . insulin aspart  0-5 Units Subcutaneous QHS  . insulin detemir  14 Units Subcutaneous BID  . loratadine  10 mg Oral Daily  . methocarbamol  500 mg Oral TID  . methylPREDNISolone (SOLU-MEDROL) injection  60 mg Intravenous Q12H  . nystatin  5 mL Oral QID  . pantoprazole  40 mg Oral BID  . pregabalin  150 mg Oral QHS  . saccharomyces boulardii  250 mg Oral BID  . zinc sulfate  220 mg Oral Daily   Continuous Infusions: . cefTRIAXone (ROCEPHIN)  IV Stopped (04/10/20 1856)    LOS: 12 days   Roxan Hockey, MD [facility]   To contact the attending provider between 7A-7P or the covering provider during after hours 7P-7A, please log into the web site [email or web address] and access using universal [place] password for that web site. If you do not have the password, please call the hospital operator.  04/11/2020, 1:55 PM

## 2020-04-11 NOTE — Progress Notes (Signed)
Assumed care of patient this AM. Patient in bed, resting on Bipap. She is breathing with ease at this time, Spo2 94%. SBP 120's. Patient wants to switch to NRB mask and HFNC this am. RT notified and will help adjust based on patient requirements.

## 2020-04-11 NOTE — Progress Notes (Signed)
NAME:  Sandra [name], MRN:  [id number], DOB:  1975/11/27, LOS: 12 ADMISSION DATE:  04-Apr-2020, CONSULTATION DATE:  04/11/2020  REFERRING MD:  TRH, courage, CHIEF COMPLAINT: Severe hypoxia  Brief History:  45 year old woman, unvaccinated admitted 2/4 with Covid pneumonia, progressed to hypoxic respiratory failure, on heated high flow nasal cannula since 2/10.  PCCM consulted for management of severe refractory hypoxia  History of Present Illness:  45 year old unvaccinated woman admitted 2/4 with hypoxia and Covid pneumonia, bilateral lung disease demonstrated on CT scan.  Required high flow nasal cannula.  Has been treated with usual therapy including remdesivir, steroids and baricitinib, progressed to heated high flow and required nonrebreather in addition.  CT angiogram chest on 2/14 showed segmental right lower lobe pulmonary emboli, converted to therapeutic full dose Lovenox. PCCM consulted to assist with refractory hypoxia.  She has tolerated intermittent BiPAP  Past Medical History:  Pseudotumor cerebri status post VP shunt Anxiety, depression POTS with episodes of tachycardia and hypotension  Significant Hospital Events:    Consults:    Procedures:    Significant Diagnostic Tests:  CT angiogram chest 2/4 extensive bilateral airspace disease. CT angiogram chest 2/14 segmental right lower lobe pulmonary emboli , progression of bilateral groundglass airspace disease Venous duplex 2/12 - bilateral neg Echo normal LV function, normal RV pressures  Micro Data:  MRSA PCR negative BC 2/4 ng  Antimicrobials:  Remdesivir 2/4 >>2/8  Interim History / Subjective:   Remains critically ill, very hypoxic. On BiPAP/100% overnight Afebrile Mild hypotension overnight received 250 cc bolus, good urine output with Lasix 2.5 L last 24 hours  Objective   Blood pressure 97/71, pulse (!) 104, temperature 99.5 F (37.5 C), temperature source Axillary, resp. rate (!) 29, height 5\' 7"   (1.702 m), weight 127.9 kg, SpO2 97 %.    Vent Mode: BIPAP FiO2 (%):  [100 %] 100 % Set Rate:  [18 bmp] 18 bmp PEEP:  [5 cmH20] 5 cmH20   Intake/Output Summary (Last 24 hours) at 04/11/2020 1319 Last data filed at 04/11/2020 0358 Gross per 24 hour  Intake 232.86 ml  Output 2430 ml  Net -2197.14 ml   Filed Weights   04/06/20 0432 04/10/20 0622 04/11/20 0357  Weight: 117 kg 124.9 kg 127.9 kg    Examination: General: Middle-aged woman lying left lateral decubitus in bed, no distress, on BiPAP with large leak HENT: Mild pallor, no icterus, no JVD Lungs: Mild accessory muscle use, bilateral scattered crackles Cardiovascular: S1-S2 regular, no murmur Abdomen: Soft, nontender, no organomegaly Extremities: No deformity, no edema Neuro: Alert, interactive, nonfocal  Labs show stable thrombocytopenia Resolved Hospital Problem list     Assessment & Plan:  Acute hypoxic respiratory failure due to COVID pneumonia  -This morning did not tolerate transitioning from BiPAP to heated high flow, work of breathing seems to have increased , remains high risk for mechanical ventilation -Continue baricitinib for 14 days. -Would continue Solu-Medrol at current doses while she remains severely hypoxic -Attempt to transition again from BiPAP after few hours -Encourage self proning, sitting up position as much as possible  Acute segmental PE -appears to be in situ thrombus rather than embolic -Agree with full dose Lovenox and monitor platelets  Mild hypotension -resolved with fluids, likely due to overdiuresis, no evidence of bleeding or sepsis  Mildly elevated LFTs -since admit  -Follow  Best practice (evaluated daily)  Diet: Full Pain/Anxiety/Delirium protocol (if indicated): N/A VAP protocol (if indicated): N/A DVT prophylaxis: Full dose Lovenox GI prophylaxis: N/A Glucose control:  SSI Mobility: Out of bed as tolerated Disposition: ICU  Goals of Care:   Code Status: Full  Labs    CBC: Recent Labs  Lab 04/07/20 0936 04/08/20 0317 04/09/20 0440 04/10/20 0448 04/11/20 0548  WBC 4.9 4.3 4.6 5.9 5.4  HGB 10.7* 9.7* 10.0* 10.5* 10.1*  HCT 33.3* 30.4* 32.7* 34.3* 32.3*  MCV 89.5 89.4 92.9 92.2 90.0  PLT 88* 93* 82* 107* 98*    Basic Metabolic Panel: Recent Labs  Lab 04/06/20 0447 04/07/20 0936 04/08/20 0317 04/09/20 0440 04/10/20 0448  NA 135 137 137 137 139  K 4.0 2.9* 3.6 3.7 3.6  CL 108 109 109 110 108  CO2 19* 21* 21* 19* 25  GLUCOSE 173* 109* 193* 198* 149*  BUN 31* 24* 17 19 26*  CREATININE 1.09* 1.03* 0.95 0.77 0.82  CALCIUM 8.2* 7.9* 7.9* 8.1* 8.3*  MG  --  2.1  --   --   --    GFR: Estimated Creatinine Clearance: 120.5 mL/min (by C-G formula based on SCr of 0.82 mg/dL). Recent Labs  Lab 04/08/20 0317 04/09/20 0440 04/10/20 0448 04/11/20 0548  WBC 4.3 4.6 5.9 5.4    Liver Function Tests: Recent Labs  Lab 04/06/20 0447 04/07/20 0936 04/09/20 0440  AST 72* 91* 63*  ALT 41 37 38  ALKPHOS 77 69 61  BILITOT 0.6 0.7 0.5  PROT 5.9* 5.1* 5.2*  ALBUMIN 2.8* 2.4* 2.3*   No results for input(s): LIPASE, AMYLASE in the last 168 hours. No results for input(s): AMMONIA in the last 168 hours.  ABG No results found for: PHART, PCO2ART, PO2ART, HCO3, TCO2, ACIDBASEDEF, O2SAT   Coagulation Profile: Recent Labs  Lab 04/10/20 0448  INR 1.2        Cyril Mourning MD. FCCP. [facility] Pager : [phone]  If no response to pager , please call [phone] until 7 pm After 7:00 pm call Elink  [phone]   04/11/2020

## 2020-04-11 NOTE — Progress Notes (Signed)
Attempted to take off bipap with RT, and place on HFNC with NRB mask over. Patient immediately dropped her Spo2 in less than 1 mintue, to 50's. She became labored in her breathing, and started saying "I can't get any air." unable to give PO meds this AM, other than one dose of cough medicine( see MAR). MD notified of patient's status at this time

## 2020-04-11 NOTE — TOC Progression Note (Signed)
Transition of Care [facility]) - Progression Note    Patient Details  Name: Sandra [name] MRN: [id number] Date of Birth: Apr 21, 1975  Transition of Care [facility]) CM/SW Contact  Annice Needy, LCSW Phone Number: 04/11/2020, 4:04 PM  Clinical Narrative:    Per attending request, voicemail message left for [facility] intake requesting that they review patient for appropriateness.         Expected Discharge Plan and Services                                                 Social Determinants of Health (SDOH) Interventions    Readmission Risk Interventions No flowsheet data found.

## 2020-04-12 DIAGNOSIS — J1282 Pneumonia due to coronavirus disease 2019: Secondary | ICD-10-CM | POA: Diagnosis not present

## 2020-04-12 DIAGNOSIS — J9601 Acute respiratory failure with hypoxia: Secondary | ICD-10-CM | POA: Diagnosis not present

## 2020-04-12 DIAGNOSIS — N390 Urinary tract infection, site not specified: Secondary | ICD-10-CM | POA: Diagnosis not present

## 2020-04-12 DIAGNOSIS — U071 COVID-19: Secondary | ICD-10-CM | POA: Diagnosis not present

## 2020-04-12 LAB — CBC
HCT: 38.6 % (ref 36.0–46.0)
Hemoglobin: 12 g/dL (ref 12.0–15.0)
MCH: 28.4 pg (ref 26.0–34.0)
MCHC: 31.1 g/dL (ref 30.0–36.0)
MCV: 91.3 fL (ref 80.0–100.0)
Platelets: 153 10*3/uL (ref 150–400)
RBC: 4.23 MIL/uL (ref 3.87–5.11)
RDW: 14.9 % (ref 11.5–15.5)
WBC: 4 10*3/uL (ref 4.0–10.5)
nRBC: 0 % (ref 0.0–0.2)

## 2020-04-12 LAB — COMPREHENSIVE METABOLIC PANEL
ALT: 62 U/L — ABNORMAL HIGH (ref 0–44)
AST: 53 U/L — ABNORMAL HIGH (ref 15–41)
Albumin: 2.8 g/dL — ABNORMAL LOW (ref 3.5–5.0)
Alkaline Phosphatase: 100 U/L (ref 38–126)
Anion gap: 11 (ref 5–15)
BUN: 22 mg/dL — ABNORMAL HIGH (ref 6–20)
CO2: 27 mmol/L (ref 22–32)
Calcium: 8.8 mg/dL — ABNORMAL LOW (ref 8.9–10.3)
Chloride: 101 mmol/L (ref 98–111)
Creatinine, Ser: 0.86 mg/dL (ref 0.44–1.00)
GFR, Estimated: >60 mL/min (ref >60)
Glucose, Bld: 178 mg/dL — ABNORMAL HIGH (ref 70–99)
Potassium: 3.5 mmol/L (ref 3.5–5.1)
Sodium: 139 mmol/L (ref 135–145)
Total Bilirubin: 0.5 mg/dL (ref 0.3–1.2)
Total Protein: 6.5 g/dL (ref 6.5–8.1)

## 2020-04-12 LAB — GLUCOSE, CAPILLARY
Glucose-Capillary: 116 mg/dL — ABNORMAL HIGH (ref 70–99)
Glucose-Capillary: 95 mg/dL (ref 70–99)
Glucose-Capillary: 130 mg/dL — ABNORMAL HIGH (ref 70–99)
Glucose-Capillary: 147 mg/dL — ABNORMAL HIGH (ref 70–99)

## 2020-04-12 MED ORDER — FUROSEMIDE 10 MG/ML IJ SOLN
20.0000 mg | Freq: Every day | INTRAMUSCULAR | Status: DC
  Administered 2020-04-13 – 2020-04-14 (×2): 20 mg via INTRAVENOUS
  Filled 2020-04-12 (×2): qty 2

## 2020-04-12 MED ORDER — LEVOFLOXACIN IN D5W 500 MG/100ML IV SOLN
500.0000 mg | INTRAVENOUS | Status: AC
  Administered 2020-04-12 – 2020-04-16 (×5): 500 mg via INTRAVENOUS
  Filled 2020-04-12 (×4): qty 100

## 2020-04-12 MED ORDER — LORAZEPAM 2 MG/ML IJ SOLN
0.5000 mg | Freq: Once | INTRAMUSCULAR | Status: AC
  Administered 2020-04-12: 0.5 mg via INTRAVENOUS
  Filled 2020-04-12: qty 1

## 2020-04-12 MED ORDER — ENSURE ENLIVE PO LIQD
237.0000 mL | Freq: Four times a day (QID) | ORAL | Status: DC
  Administered 2020-04-12: 237 mL via ORAL

## 2020-04-12 NOTE — Progress Notes (Addendum)
Nutrition Follow-up  DOCUMENTATION CODES:   Morbid obesity  INTERVENTION:   -Increase Ensure Enlive po QID, each supplement provides 350 kcal and 20 grams of protein -Continue 30 ml Prosource Plus TID, each supplement provides 100 kcals and 15 grams protein -If poor acceptance of supplement regimen, consider placement of NG tube and initiating - Vital 1.2 @ 30 ml/hr, advance 10 ml every 6 hours to goal rate of 70 ml/hr (1680 ml/day) -Prosource TF 45 ml TID via tube  Regimen at goal rate provides 2136 kcal, 159 grams protein, and 1361 ml free water  NUTRITION DIAGNOSIS:   Increased nutrient needs related to catabolic illness (QJJHE-17 infection) as evidenced by estimated needs.  Ongoing  GOAL:   Patient will meet greater than or equal to 90% of their needs  Unmet  MONITOR:   Labs,I & O's,Skin,Supplement acceptance,PO intake,Weight trends  REASON FOR ASSESSMENT:   Malnutrition Screening Tool    ASSESSMENT:   45 year old female admitted with acute respiratory failure with hypoxia secondary to COVID-19 pneumonia. Past medical history of GERD, vertigo, depression, anxiety, pseudomotor cerebri, benign intracranial hypertension with VP shunt presented with worsening SOB, cough, fatigue, and body aches since testing positive for Covid 7 days ago with home kit.  2/15 on intermittent BiPAP. 2/16 diuresed well with Lasix, severe hypoxia during transition from BiPAP to high flow ,  Reviewed I/O's: -2.1 L x 24 hours and -1.6 L since admission  UOP: 2.1 L x 24 hours  Pt resting quietly; family members at bedside. RD did not disturb.   Per PCCM notes, pt is at high risk for intubation due to hypoxia when transition from Bi-pap to heated high flow [place]. Pt considering elective intubation, however, would like to discuss this with her children first.   Pt remains with poor oral intake; noted meal completions 0-10%. Pt with variable acceptance of Ensure supplements. Noted pt with no  BM in 4 days; suspect poor oral intake is contributing.   Given ongoing poor oral intake, RD reached out to Dr. Denton Brick via secure chat regarding possible NGT placement. Per MD, pt will likely get intubated within the next 24 hours and can start feeds via OGT when this does occur.   Medications reviewed and include vitamin D3, vitamin C, solu-medrol, and zinc sulfate.   Labs reviewed: CBGS: 95-130 (inpatient orders for glycemic control are 0-15 units insulin aspart TID with meal,s 0-5 units insulin aspart daily at bedtime, and 14 units insulin detemir BID).   Diet Order:   Diet Order            Diet heart healthy/carb modified Room service appropriate? Yes; Fluid consistency: Thin  Diet effective now                 EDUCATION NEEDS:   Not appropriate for education at this time  Skin:  Skin Assessment: Reviewed RN Assessment  Last BM:  04/08/20  Height:   Ht Readings from Last 1 Encounters:  04/02/20 '5\' 7"'  (1.702 m)    Weight:   Wt Readings from Last 1 Encounters:  04/11/20 127.9 kg   BMI:  Body mass index is 44.16 kg/m.  Estimated Nutritional Needs:   Kcal:  4081-4481  Protein:  140-154  Fluid:  >2.2 L    Loistine Chance, RD, LDN, [place] Registered Dietitian II Certified Diabetes Care and Education Specialist Please refer to [facility] for RD and/or RD on-call/weekend/after hours pager

## 2020-04-12 NOTE — Progress Notes (Signed)
PROGRESS NOTE    Sandra [name]  PFX:[id number] DOB: 08-04-1975 DOA: 04/10/2020 PCP: Glenda Chroman, MD   Chief Complaint  Patient presents with  . Shortness of Breath    Brief admission Narrative:  Sandra [name]  is a 45 y.o. female, nickel history of GERD,vertigo, depression, anxiety, pseudotumor cerebri, benign intracranial hypertension, with VP shunt, patient is unvaccinated against Covid, admitted on 04/22/2020 with acute hypoxic respiratory failure with O2 sats down to 73% on room air on admission---and found to have Covid 19 respiratory infection/pneumonia-  CTA chest negative for PE on admission--- and she required high flow nasal cannula.  Assessment & Plan:  1) Acute Respiratory Failure with Hypoxia secondary to COVID-19 Pneumonia --Continue Treatment with iv Steroids and Baricitinib. -Patient has been encourage to participate with proning position, incentive spirometer and flutter valve. -Completed Remdesivir already -Continue as needed antitussive medications, vitamin C and zinc. --Encourage BiPAP use nightly   Continue also to use Robaxin and prn oxycodone as needed.  04/12/20 -Continues to have profound hypoxia, desaturates very quickly with attempts to take her off BiPAP to HFNC Plus NRB -CXR on 04/11/20--with worsening Covid pneumonia findings -CTA chest on 04/09/2020 with finding of pulmonary embolism and concern for possible ARDS versus edema -Patient diuresed very well with low-dose Lasix -Dr. Elsworth Soho and I spoke with patient at bedside on 04/12/2020 about the benefits of elective intubation versus hypoxic emergency -  2)Acute PE--- therapeutic Lovenox as ordered, watch platelets closely --LE Venous Doppler w/o acute findings Echo with preserved EF and largely unremarkable, no right heart strain -Hemoglobin and platelets continues to improve and have mostly normalized at this time  3)ARDS Vs Pulm Edema--IV Lasix as ordered -Diuresing well, BNP 272   PCCM consult  appreciated -Profound hypoxia persist -Echo with preserved EF  4)Episodes of Transient Tachycardia and Transient Hypotension--- patient has longstanding history of POTS--- Improved with IVFs -Avoid further IV fluids due to concerns for volume overload  5)Possible UTI ----T max was 101.5 , no further fevers---,  -Foley catheter removed 04/10/2020 -Continue IV Rocephin pending urine and blood culture results -Patient had Foley catheter initially due to hemodynamic instability with hypotension and tachycardia requiring aggressive fluid boluses in the setting of severe COVID-19 infection with profound persistent hypoxia making it difficult for patient to get out of bed and need for accurate input and output monitoring  6)-Depression/Anxiety --Continue Prozac, continue Xanax as needed -  7)-GERD (gastroesophageal reflux disease) -Continue PPI.  8)-Pseudotumor cerebri -Continue Diamox -Patient denies headaches currently.  9)-acute kidney injury -In the setting of prerenal azotemia, dehydration and transient hypotension -Stopped HCTZ  --- renally adjust medications, avoid nephrotoxic agents / dehydration  / hypotension -Renal function normalized -Renal function closely with reinitiation of IV Lasix  10)-mild transaminitis ; most likely in the setting of acute COVID infection -Holding statins currently. -Alk phos and T bili WNL Hepatic Function Latest Ref Rng & Units 04/12/2020 04/09/2020 04/07/2020  Total Protein 6.5 - 8.1 g/dL 6.5 5.2(L) 5.1(L)  Albumin 3.5 - 5.0 g/dL 2.8(L) 2.3(L) 2.4(L)  AST 15 - 41 U/L 53(H) 63(H) 91(H)  ALT 0 - 44 U/L 62(H) 38 37  Alk Phosphatase 38 - 126 U/L 100 61 69  Total Bilirubin 0.3 - 1.2 mg/dL 0.5 0.5 0.7    11-history of migraines -Continue as needed Fioricet and rizatriptan.  12-history of COPD -Continue   inhaler regimen. -Steroids treatment as  above,   13-type 2 diabetes with hyperglycemia -No prior history of diabetes or using hypoglycemic  agents prior to admission -Steroids therapy causing hyperglycemia -Continue sliding scale insulin and adjusted dose of Levemir -Continue to follow CBGs and adjust hypoglycemic regimen as required.  14)-Morbid obesity (HCC) -Body mass index is 40.4 kg/m. --With concerns for obesity hypoventilation syndrome, sleep apnea;  ----Bipap qhs advised--  15) acute thrombocytopenia and acute anemia--- suspect due to severe infection --Hemoglobin and platelets continues to improve and have mostly normalized at this time -No obvious  bleeding noted - watch closely especially while on Lovenox   DVT prophylaxis:Lovenox Code Status: Full code. Family Communication: Patient's daughter- Sandra [name] and pt's son Sandra [name] Disposition:   Status is: Inpatient  Dispo: The patient is from: Home              Anticipated d/c is to: ?? LTAC Vs SNF              Anticipated d/c date is: To be determined              Patient currently no medically stable for discharge; still requiring significant amount of oxygen supplementationcontinue treatment with IV steroids, and baricitinib.  Using BiPAP nightly.   Difficult to place patient: No  Consultants:   None   Procedures:  See below for x-ray reports.  Antimicrobials/antiviral Remdesivir 5 out of 5  Subjective:  04/12/20 -Dr. Elsworth Soho and I spoke with patient at bedside on 04/12/2020 about the benefits of elective intubation versus hypoxic emergency -Patient son Sandra [name] at bedside -No fever  Or chills   No Nausea, Vomiting or Diarrhea  Objective: Vitals:   04/11/20 2235 04/11/20 2300 04/12/20 0754 04/12/20 0916  BP: (!) 129/96 (!) 116/48    Pulse: 77 77 70   Resp: (!) _0 Temp:   (!) 97.5 F (36.4 C)   TempSrc:   Axillary   SpO2: 96% 98% 98% 91%  Weight:      Height:        Intake/Output Summary (Last 24 hours) at 04/12/2020 1108 Last data filed at 04/12/2020 0500 Gross per 24 hour  Intake -  Output 2050 ml  Net -2050 ml   Filed Weights    04/06/20 0432 04/10/20 0622 04/11/20 0357  Weight: 117 kg 124.9 kg 127.9 kg    Examination:  Physical Exam Gen:-Overall ill-appearing, conversational dyspnea HEENT:- [place].AT, No sclera icterus Nose:-BiPAP  Alternating with HFNC + NRB Neck-Supple Neck,No JVD,.  Lungs-diminished patient with bibasilar rales  CV- S1, S2 normal Abd-  +ve B.Sounds, Abd Soft, No tenderness, increased truncal adiposity , no CVA area tenderness Extremity/Skin:- No  edema,   good pulses Psych-affect is anxious, oriented x3 Neuro-generalized weakness, no new focal deficits, no tremors   Data Reviewed: I have personally reviewed following labs and imaging studies  CBC: Recent Labs  Lab 04/08/20 0317 04/09/20 0440 04/10/20 0448 04/11/20 0548 04/12/20 0535  WBC 4.3 4.6 5.9 5.4 4.0  HGB 9.7* 10.0* 10.5* 10.1* 12.0  HCT 30.4* 32.7* 34.3* 32.3* 38.6  MCV 89.4 92.9 92.2 90.0 91.3  PLT 93* 82* 107* 98* 469    Basic Metabolic Panel: Recent Labs  Lab 04/07/20 0936 04/08/20 0317 04/09/20 0440 04/10/20 0448 04/12/20 0535  NA 137 137 137 139 139  K 2.9* 3.6 3.7 3.6 3.5  CL 109 109 110 108 101  CO2 21* 21* 19* 25 27  GLUCOSE 109* 193* 198* 149* 178*  BUN 24* 17 19 26* 22*  CREATININE 1.03* 0.95 0.77 0.82 0.86  CALCIUM 7.9* 7.9* 8.1* 8.3* 8.8*  MG  2.1  --   --   --   --     GFR: Estimated Creatinine Clearance: 114.9 mL/min (by C-G formula based on SCr of 0.86 mg/dL).  Liver Function Tests: Recent Labs  Lab 04/06/20 0447 04/07/20 0936 04/09/20 0440 04/12/20 0535  AST 72* 91* 63* 53*  ALT 41 37 38 62*  ALKPHOS 77 69 61 100  BILITOT 0.6 0.7 0.5 0.5  PROT 5.9* 5.1* 5.2* 6.5  ALBUMIN 2.8* 2.4* 2.3* 2.8*    CBG: Recent Labs  Lab 04/11/20 0731 04/11/20 1054 04/11/20 1617 04/11/20 2108 04/12/20 0752  GLUCAP 100* 89 131* 117* 116*     Recent Results (from the past 240 hour(s))  Urine Culture     Status: Abnormal (Preliminary result)   Collection Time: 04/10/20  3:20 PM   Specimen:  Urine, Catheterized  Result Value Ref Range Status   Specimen Description   Final    URINE, CATHETERIZED Performed at [facility], [address] [place], [place] [address]    Special Requests   Final    Normal Performed at [facility], [address] [place], [place] [address]    Culture >=100,000 COLONIES/mL ENTEROCOCCUS FAECALIS (A)  Final   Report Status PENDING  Incomplete  Culture, blood (Routine X 2) w Reflex to ID Panel     Status: None (Preliminary result)   Collection Time: 04/10/20  9:16 PM   Specimen: BLOOD LEFT HAND  Result Value Ref Range Status   Specimen Description BLOOD LEFT HAND  Final   Special Requests   Final    BOTTLES DRAWN AEROBIC AND ANAEROBIC Blood Culture adequate volume   Culture   Final    NO GROWTH < 12 HOURS Performed at [facility], [address] [place], [place] [address]    Report Status PENDING  Incomplete  Culture, blood (Routine X 2) w Reflex to ID Panel     Status: None (Preliminary result)   Collection Time: 04/10/20  9:18 PM   Specimen: BLOOD RIGHT HAND  Result Value Ref Range Status   Specimen Description BLOOD RIGHT HAND  Final   Special Requests   Final    BOTTLES DRAWN AEROBIC ONLY Blood Culture results may not be optimal due to an inadequate volume of blood received in culture bottles   Culture   Final    NO GROWTH < 12 HOURS Performed at [facility], [address] [place],  [address]    Report Status PENDING  Incomplete    Radiology Studies: DG CHEST PORT 1 VIEW  Result Date: 04/11/2020 CLINICAL DATA:  Dyspnea, increased shortness of breath, hypertension, COVID-19 positive EXAM: PORTABLE CHEST 1 VIEW COMPARISON:  Portable exam 0956 hours compared to 04/07/2020 FINDINGS: Normal heart size mediastinal contours. LEFT jugular line with tip projecting over SVC. Hazy BILATERAL pulmonary infiltrates in the mid to lower lungs greater on LEFT consistent with multifocal pneumonia and history of COVID-19. No pleural effusion or  pneumothorax. IMPRESSION: Increased BILATERAL pulmonary infiltrates consistent with multifocal pneumonia and COVID-19. Electronically Signed   By: Lavonia Dana M.D.   On: 04/11/2020 11:49   Scheduled Meds: . (feeding supplement) PROSource Plus  30 mL Oral TID BM  . acetaZOLAMIDE  250 mg Oral BID  . acyclovir  200 mg Oral Daily  . vitamin C  500 mg Oral Daily  . azelastine  2 spray Each Nare Daily  . baricitinib  4 mg Oral Daily  . cholecalciferol  25 mcg Oral Daily  . enoxaparin (  LOVENOX) injection  120 mg Subcutaneous Q12H  . feeding supplement  237 mL Oral BID BM  . feeding supplement (NEPRO CARB STEADY)  237 mL Oral TID BM  . FLUoxetine  40 mg Oral Daily  . fluticasone furoate-vilanterol  1 puff Inhalation Daily  . furosemide  20 mg Intravenous Q12H  . insulin aspart  0-15 Units Subcutaneous TID WC  . insulin aspart  0-5 Units Subcutaneous QHS  . insulin detemir  14 Units Subcutaneous BID  . loratadine  10 mg Oral Daily  . methocarbamol  500 mg Oral TID  . methylPREDNISolone (SOLU-MEDROL) injection  60 mg Intravenous Q12H  . nystatin  5 mL Oral QID  . pantoprazole  40 mg Oral BID  . pregabalin  150 mg Oral QHS  . saccharomyces boulardii  250 mg Oral BID  . zinc sulfate  220 mg Oral Daily   Continuous Infusions: . cefTRIAXone (ROCEPHIN)  IV 2 g (04/11/20 1500)    LOS: 13 days   Roxan Hockey, MD [facility]  To contact the attending provider between 7A-7P or the covering provider during after hours 7P-7A, please log into the web site [email or web address] and access using universal [place] password for that web site. If you do not have the password, please call the hospital operator.  04/12/2020, 11:08 AM

## 2020-04-12 NOTE — Progress Notes (Signed)
Placed patient on Bipap for the night.  Patient is resting comfortably, however, patient did state she has claustrophobic issues.  I asked patient to try to relax, not fight the machine but breathe with it, and now she seems to be more comfortable.  Will continue to monitor.

## 2020-04-12 NOTE — Progress Notes (Signed)
NAME:  Sandra [name], MRN:  [id number], DOB:  21-Nov-1975, LOS: 13 ADMISSION DATE:  04/09/2020, CONSULTATION DATE:  04/12/2020  REFERRING MD:  TRH, courage, CHIEF COMPLAINT: Severe hypoxia  Brief History:  45 year old woman, unvaccinated admitted 2/4 with Covid pneumonia, progressed to hypoxic respiratory failure, on heated high flow nasal cannula since 2/10.  CT angiogram chest on 2/14 showed segmental right lower lobe pulmonary emboli, converted to therapeutic full dose Lovenox. PCCM consulted to assist with refractory hypoxia.  She has tolerated intermittent BiPAP  Past Medical History:  Pseudotumor cerebri status post VP shunt Anxiety, depression POTS with episodes of tachycardia and hypotension  Significant Hospital Events:  2/15 on intermittent BiPAP. 2/16 diuresed well with Lasix, severe hypoxia during transition from BiPAP to high flow ,  Consults:    Procedures:    Significant Diagnostic Tests:  CT angiogram chest 2/4 extensive bilateral airspace disease. CT angiogram chest 2/14 segmental right lower lobe pulmonary emboli , progression of bilateral groundglass airspace disease Venous duplex 2/12 - bilateral neg Echo normal LV function, normal RV pressures  Micro Data:  MRSA PCR negative BC 2/4 ng  Antimicrobials:  Remdesivir 2/4 >>2/8  Interim History / Subjective:   Remains critically ill On BiPAP, 10/5, 100% Did not sleep well. Afebrile    Objective   Blood pressure (!) 116/48, pulse 70, temperature (!) 97.5 F (36.4 C), temperature source Axillary, resp. rate 10, height 5\' 7"  (1.702 m), weight 127.9 kg, SpO2 91 %.    Vent Mode: BIPAP FiO2 (%):  [100 %] 100 % Set Rate:  [18 bmp] 18 bmp PEEP:  [5 cmH20] 5 cmH20   Intake/Output Summary (Last 24 hours) at 04/12/2020 1037 Last data filed at 04/12/2020 0500 Gross per 24 hour  Intake -  Output 2050 ml  Net -2050 ml   Filed Weights   04/06/20 0432 04/10/20 0622 04/11/20 0357  Weight: 117 kg 124.9 kg  127.9 kg    Examination: General: Middle-aged woman lying left lateral decubitus in bed, no distress, on BiPAP with large leak HENT: Mild pallor, no icterus, no JVD Lungs: Mild accessory muscle use, bilateral scattered crackles Cardiovascular: S1-S2 regular, no murmur Abdomen: Soft, nontender, no organomegaly Extremities: No deformity, no edema Neuro: Alert, interactive, nonfocal  Labs show stable thrombocytopenia Resolved Hospital Problem list     Assessment & Plan:  Acute hypoxic respiratory failure due to COVID pneumonia  -She gets very hypoxic during transition from BiPAP to heated high flow, is high risk for mechanical ventilation -Continue baricitinib for 14 days. - continue Solu-Medrol  -Encourage self proning, sitting up position as much as possible -We had a long discussion with the patient about elective intubation, she only wants to do this as a last resort.  I am afraid that her hypoxia is worsening and explained the risk of hypoxic emergency.  She would like to visit with her son and daughter today  Acute segmental PE -appears to be in situ thrombus rather than embolic -Agree with full dose Lovenox, platelet count improving  Mild hypotension -resolved with fluids, likely due to overdiuresis, no evidence of bleeding or sepsis  Mildly elevated LFTs -since admit  -Improved  Best practice (evaluated daily)  Diet: Full Pain/Anxiety/Delirium protocol (if indicated): N/A VAP protocol (if indicated): N/A DVT prophylaxis: Full dose Lovenox GI prophylaxis: N/A Glucose control: SSI Mobility: Out of bed as tolerated Disposition: ICU  Goals of Care:   Code Status: Full  Labs   CBC: Recent Labs  Lab 04/08/20 0317 04/09/20  7741 04/10/20 0448 04/11/20 0548 04/12/20 0535  WBC 4.3 4.6 5.9 5.4 4.0  HGB 9.7* 10.0* 10.5* 10.1* 12.0  HCT 30.4* 32.7* 34.3* 32.3* 38.6  MCV 89.4 92.9 92.2 90.0 91.3  PLT 93* 82* 107* 98* 153    Basic Metabolic Panel: Recent Labs   Lab 04/07/20 0936 04/08/20 0317 04/09/20 0440 04/10/20 0448 04/12/20 0535  NA 137 137 137 139 139  K 2.9* 3.6 3.7 3.6 3.5  CL 109 109 110 108 101  CO2 21* 21* 19* 25 27  GLUCOSE 109* 193* 198* 149* 178*  BUN 24* 17 19 26* 22*  CREATININE 1.03* 0.95 0.77 0.82 0.86  CALCIUM 7.9* 7.9* 8.1* 8.3* 8.8*  MG 2.1  --   --   --   --    GFR: Estimated Creatinine Clearance: 114.9 mL/min (by C-G formula based on SCr of 0.86 mg/dL). Recent Labs  Lab 04/09/20 0440 04/10/20 0448 04/11/20 0548 04/12/20 0535  WBC 4.6 5.9 5.4 4.0    Liver Function Tests: Recent Labs  Lab 04/06/20 0447 04/07/20 0936 04/09/20 0440 04/12/20 0535  AST 72* 91* 63* 53*  ALT 41 37 38 62*  ALKPHOS 77 69 61 100  BILITOT 0.6 0.7 0.5 0.5  PROT 5.9* 5.1* 5.2* 6.5  ALBUMIN 2.8* 2.4* 2.3* 2.8*   No results for input(s): LIPASE, AMYLASE in the last 168 hours. No results for input(s): AMMONIA in the last 168 hours.  ABG No results found for: PHART, PCO2ART, PO2ART, HCO3, TCO2, ACIDBASEDEF, O2SAT   Coagulation Profile: Recent Labs  Lab 04/10/20 0448  INR 1.2        Cyril Mourning MD. FCCP. [facility] Pager : [phone]  If no response to pager , please call [phone] until 7 pm After 7:00 pm call Elink  [phone]   04/12/2020

## 2020-04-12 NOTE — TOC Progression Note (Signed)
Transition of Care [facility]) - Progression Note    Patient Details  Name: Sandra [name] MRN: [id number] Date of Birth: [birth year]  [facility]) CM/SW Contact  Annice Needy, LCSW Phone Number: 04/12/2020, 2:04 PM  Clinical Narrative:    [facility] can accept patient when she is 80% FI02 or if she is intubated as long as the settings are not "super high."  [facility] can accept patient when she is 60-65% FI02.        Expected Discharge Plan and Services                                                 Social Determinants of Health (SDOH) Interventions    Readmission Risk Interventions No flowsheet data found.

## 2020-04-13 ENCOUNTER — Inpatient Hospital Stay (HOSPITAL_COMMUNITY): Payer: Medicare Other

## 2020-04-13 ENCOUNTER — Encounter (HOSPITAL_COMMUNITY): Payer: Self-pay | Admitting: Internal Medicine

## 2020-04-13 DIAGNOSIS — I2693 Single subsegmental pulmonary embolism without acute cor pulmonale: Secondary | ICD-10-CM

## 2020-04-13 DIAGNOSIS — J9601 Acute respiratory failure with hypoxia: Secondary | ICD-10-CM | POA: Diagnosis not present

## 2020-04-13 DIAGNOSIS — J1282 Pneumonia due to coronavirus disease 2019: Secondary | ICD-10-CM | POA: Diagnosis not present

## 2020-04-13 DIAGNOSIS — J80 Acute respiratory distress syndrome: Secondary | ICD-10-CM | POA: Diagnosis not present

## 2020-04-13 DIAGNOSIS — U071 COVID-19: Secondary | ICD-10-CM | POA: Diagnosis not present

## 2020-04-13 LAB — POCT I-STAT 7, (LYTES, BLD GAS, ICA,H+H)
Acid-Base Excess: 1 mmol/L (ref 0.0–2.0)
Acid-Base Excess: 2 mmol/L (ref 0.0–2.0)
Bicarbonate: 28 mmol/L (ref 20.0–28.0)
Bicarbonate: 28.3 mmol/L — ABNORMAL HIGH (ref 20.0–28.0)
Calcium, Ion: 1.22 mmol/L (ref 1.15–1.40)
Calcium, Ion: 1.23 mmol/L (ref 1.15–1.40)
HCT: 32 % — ABNORMAL LOW (ref 36.0–46.0)
HCT: 33 % — ABNORMAL LOW (ref 36.0–46.0)
Hemoglobin: 10.9 g/dL — ABNORMAL LOW (ref 12.0–15.0)
Hemoglobin: 11.2 g/dL — ABNORMAL LOW (ref 12.0–15.0)
O2 Saturation: 91 %
O2 Saturation: 94 %
Patient temperature: 99
Patient temperature: 99.2
Potassium: 3.4 mmol/L — ABNORMAL LOW (ref 3.5–5.1)
Potassium: 3.6 mmol/L (ref 3.5–5.1)
Sodium: 138 mmol/L (ref 135–145)
Sodium: 140 mmol/L (ref 135–145)
TCO2: 30 mmol/L (ref 22–32)
TCO2: 30 mmol/L (ref 22–32)
pCO2 arterial: 52.3 mmHg — ABNORMAL HIGH (ref 32.0–48.0)
pCO2 arterial: 56.5 mmHg — ABNORMAL HIGH (ref 32.0–48.0)
pH, Arterial: 7.305 — ABNORMAL LOW (ref 7.350–7.450)
pH, Arterial: 7.343 — ABNORMAL LOW (ref 7.350–7.450)
pO2, Arterial: 67 mmHg — ABNORMAL LOW (ref 83.0–108.0)
pO2, Arterial: 81 mmHg — ABNORMAL LOW (ref 83.0–108.0)

## 2020-04-13 LAB — CBC
HCT: 32.4 % — ABNORMAL LOW (ref 36.0–46.0)
Hemoglobin: 10.1 g/dL — ABNORMAL LOW (ref 12.0–15.0)
MCH: 28.5 pg (ref 26.0–34.0)
MCHC: 31.2 g/dL (ref 30.0–36.0)
MCV: 91.5 fL (ref 80.0–100.0)
Platelets: 148 10*3/uL — ABNORMAL LOW (ref 150–400)
RBC: 3.54 MIL/uL — ABNORMAL LOW (ref 3.87–5.11)
RDW: 14.8 % (ref 11.5–15.5)
WBC: 4.9 10*3/uL (ref 4.0–10.5)
nRBC: 0 % (ref 0.0–0.2)

## 2020-04-13 LAB — BLOOD GAS, ARTERIAL
Acid-Base Excess: 1.3 mmol/L (ref 0.0–2.0)
Bicarbonate: 24.7 mmol/L (ref 20.0–28.0)
FIO2: 100
O2 Saturation: 88.2 %
Patient temperature: 37.8
pCO2 arterial: 59.2 mmHg — ABNORMAL HIGH (ref 32.0–48.0)
pH, Arterial: 7.287 — ABNORMAL LOW (ref 7.350–7.450)
pO2, Arterial: 70.4 mmHg — ABNORMAL LOW (ref 83.0–108.0)

## 2020-04-13 LAB — GLUCOSE, CAPILLARY
Glucose-Capillary: 84 mg/dL (ref 70–99)
Glucose-Capillary: 67 mg/dL — ABNORMAL LOW (ref 70–99)
Glucose-Capillary: 135 mg/dL — ABNORMAL HIGH (ref 70–99)
Glucose-Capillary: 222 mg/dL — ABNORMAL HIGH (ref 70–99)
Glucose-Capillary: 238 mg/dL — ABNORMAL HIGH (ref 70–99)

## 2020-04-13 LAB — MAGNESIUM: Magnesium: 2.2 mg/dL (ref 1.7–2.4)

## 2020-04-13 LAB — PHOSPHORUS: Phosphorus: 5 mg/dL — ABNORMAL HIGH (ref 2.5–4.6)

## 2020-04-13 MED ORDER — VITAL HIGH PROTEIN PO LIQD: 1000.0000 mL | ORAL | Status: DC

## 2020-04-13 MED ORDER — FENTANYL BOLUS VIA INFUSION
50.0000 ug | INTRAVENOUS | Status: DC | PRN
  Filled 2020-04-13: qty 50

## 2020-04-13 MED ORDER — ENSURE ENLIVE PO LIQD: 237.0000 mL | Freq: Four times a day (QID) | ORAL | Status: DC

## 2020-04-13 MED ORDER — MIDAZOLAM HCL 2 MG/2ML IJ SOLN
INTRAMUSCULAR | Status: AC
  Administered 2020-04-13: 2 mg via INTRAVENOUS
  Filled 2020-04-13: qty 2

## 2020-04-13 MED ORDER — MIDAZOLAM HCL 2 MG/2ML IJ SOLN: 2.0000 mg | INTRAMUSCULAR | Status: DC | PRN

## 2020-04-13 MED ORDER — NOREPINEPHRINE 4 MG/250ML-% IV SOLN
0.0000 ug/min | INTRAVENOUS | Status: DC
  Administered 2020-04-14: 2 ug/min via INTRAVENOUS
  Administered 2020-04-14: 6 ug/min via INTRAVENOUS
  Administered 2020-04-15: 5 ug/min via INTRAVENOUS
  Administered 2020-04-16: 7 ug/min via INTRAVENOUS
  Administered 2020-04-16: 6 ug/min via INTRAVENOUS
  Administered 2020-04-17 – 2020-04-21 (×4): 3 ug/min via INTRAVENOUS
  Administered 2020-04-22: 8 ug/min via INTRAVENOUS
  Administered 2020-04-22: 6 ug/min via INTRAVENOUS
  Administered 2020-04-23: 24 ug/min via INTRAVENOUS
  Administered 2020-04-23: 20 ug/min via INTRAVENOUS
  Administered 2020-04-23: 16 ug/min via INTRAVENOUS
  Administered 2020-04-23: 26 ug/min via INTRAVENOUS
  Administered 2020-04-23: 11 ug/min via INTRAVENOUS
  Administered 2020-04-23: 28 ug/min via INTRAVENOUS
  Administered 2020-04-23: 23 ug/min via INTRAVENOUS
  Filled 2020-04-13 (×18): qty 250

## 2020-04-13 MED ORDER — FENTANYL 2500MCG IN NS 250ML (10MCG/ML) PREMIX INFUSION
0.0000 ug/h | INTRAVENOUS | Status: DC
  Administered 2020-04-13: 25 ug/h via INTRAVENOUS
  Administered 2020-04-13: 350 ug/h via INTRAVENOUS
  Administered 2020-04-14 – 2020-04-15 (×5): 400 ug/h via INTRAVENOUS
  Administered 2020-04-15: 300 ug/h via INTRAVENOUS
  Administered 2020-04-15: 400 ug/h via INTRAVENOUS
  Administered 2020-04-16: 300 ug/h via INTRAVENOUS
  Filled 2020-04-13 (×10): qty 250

## 2020-04-13 MED ORDER — ACETAZOLAMIDE 250 MG PO TABS
250.0000 mg | ORAL_TABLET | Freq: Two times a day (BID) | ORAL | Status: DC
  Administered 2020-04-14: 250 mg via ORAL
  Filled 2020-04-13 (×2): qty 1

## 2020-04-13 MED ORDER — PANTOPRAZOLE SODIUM 40 MG IV SOLR
40.0000 mg | Freq: Every day | INTRAVENOUS | Status: DC
  Administered 2020-04-14 – 2020-04-26 (×14): 40 mg via INTRAVENOUS
  Filled 2020-04-13 (×14): qty 40

## 2020-04-13 MED ORDER — PROPOFOL 1000 MG/100ML IV EMUL
5.0000 ug/kg/min | INTRAVENOUS | Status: DC
  Administered 2020-04-14 (×3): 50 ug/kg/min via INTRAVENOUS
  Administered 2020-04-14: 10 ug/kg/min via INTRAVENOUS
  Administered 2020-04-14 – 2020-04-15 (×9): 50 ug/kg/min via INTRAVENOUS
  Administered 2020-04-16: 40 ug/kg/min via INTRAVENOUS
  Administered 2020-04-16 (×2): 50 ug/kg/min via INTRAVENOUS
  Filled 2020-04-13 (×17): qty 100

## 2020-04-13 MED ORDER — DOCUSATE SODIUM 50 MG/5ML PO LIQD
100.0000 mg | Freq: Two times a day (BID) | ORAL | Status: DC
  Administered 2020-04-14 – 2020-04-26 (×15): 100 mg
  Filled 2020-04-13 (×17): qty 10

## 2020-04-13 MED ORDER — FENTANYL CITRATE (PF) 100 MCG/2ML IJ SOLN: 50.0000 ug | Freq: Once | INTRAMUSCULAR | Status: AC

## 2020-04-13 MED ORDER — VITAL AF 1.2 CAL PO LIQD: 1000.0000 mL | ORAL | Status: DC

## 2020-04-13 MED ORDER — PROSOURCE TF PO LIQD
45.0000 mL | Freq: Three times a day (TID) | ORAL | Status: DC
  Administered 2020-04-14: 45 mL
  Filled 2020-04-13: qty 45

## 2020-04-13 MED ORDER — FENTANYL CITRATE (PF) 100 MCG/2ML IJ SOLN
INTRAMUSCULAR | Status: AC
  Administered 2020-04-13: 50 ug via INTRAVENOUS
  Filled 2020-04-13: qty 2

## 2020-04-13 MED ORDER — POLYETHYLENE GLYCOL 3350 17 G PO PACK
17.0000 g | PACK | Freq: Every day | ORAL | Status: DC
  Administered 2020-04-15 – 2020-04-25 (×6): 17 g
  Filled 2020-04-13 (×6): qty 1

## 2020-04-13 MED ORDER — MIDAZOLAM 50MG/50ML (1MG/ML) PREMIX INFUSION
0.5000 mg/h | INTRAVENOUS | Status: DC
  Administered 2020-04-13: 10 mg/h via INTRAVENOUS
  Administered 2020-04-13: 15:00:00 2 mg/h via INTRAVENOUS
  Administered 2020-04-13 – 2020-04-14 (×5): 10 mg/h via INTRAVENOUS
  Administered 2020-04-15: 7 mg/h via INTRAVENOUS
  Administered 2020-04-15 (×2): 10 mg/h via INTRAVENOUS
  Administered 2020-04-16: 8.5 mg/h via INTRAVENOUS
  Filled 2020-04-13 (×12): qty 50

## 2020-04-13 MED ORDER — NOREPINEPHRINE 4 MG/250ML-% IV SOLN
INTRAVENOUS | Status: AC
  Administered 2020-04-13: 2 ug/min via INTRAVENOUS
  Filled 2020-04-13: qty 250

## 2020-04-13 NOTE — Progress Notes (Signed)
NAME:  Sandra [name], MRN:  [id number], DOB:  01-Jul-1975, LOS: 14 ADMISSION DATE:  04/10/2020,   Brief History:  This is a 45 year old female with COVID-19 pneumonia and ARDS  History of Present Illness:  Is a 45 year old unvaccinated white female who presented to the hospital on 04/18/2020 with Covid pneumonia.  Over the next 14 days she has developed Covid ARDS.  She progressed from high flow nasal cannula to BiPAP and then ultimately was intubated on 04/12/2020.  She has been treated with remdesivir steroids and baricitnib.  On 02/14 patient had a small segmental right lower lobe pulmonary embolism and was converted to full dose Lovenox.   .  Past Medical History:  Anxiety Arthritis Depression Fibromyalgia GERD Gastric ulceration Hiatal hernia POTS Hypertension Morbid obesity  Significant Hospital Events:  Intubation  Consults:  Critical care medicine  Procedures:  04/13/20 intubated 04/13/2020 triple-lumen catheter placed  Significant Diagnostic Tests:    Micro Data:  Covid + 04/09/2020  Antimicrobials:  Levaquin    Objective   Blood pressure 140/74, pulse 61, temperature 99.2 F (37.3 C), temperature source Axillary, resp. rate (!) 25, height 5\' 4"  (1.626 m), weight 120.5 kg, SpO2 95 %.    Vent Mode: PRVC FiO2 (%):  [100 %] 100 % Set Rate:  [24 bmp-28 bmp] 28 bmp Vt Set:  [490 mL] 490 mL PEEP:  [12 cmH20-15 cmH20] 12 cmH20 Plateau Pressure:  [29 cmH20-32 cmH20] 31 cmH20   Intake/Output Summary (Last 24 hours) at 04/13/2020 2312 Last data filed at 04/13/2020 1300 Gross per 24 hour  Intake 750 ml  Output 2100 ml  Net -1350 ml   Filed Weights   04/10/20 0622 04/11/20 0357 04/13/20 2230  Weight: 124.9 kg 127.9 kg 120.5 kg    Examination: General: No acute distress HENT: Orally intubated with endotracheal tube.  Mucous membranes are moist.  Atraumatic/normocephalic Lungs: Clear to auscultation no wheezing rales or rhonchi noted Cardiovascular:  Regular rate no murmur rub or gallop Abdomen: Soft, obese, nondistended positive bowel sounds no rebound/rigidity/guarding though limited by neurologic status Extremities: Distal pulse intact x4.  No significant edema.  No clubbing or cyanosis. Neuro: RASS -4 not following commands. GU: Foley catheter intact    Assessment & Plan:  Acute respiratory failure Covid ARDS Covid pneumonia Morbid obesity Idiopathic intracranial hypertension POTS Chronic pain PE  Plan: Patient is transferred from 04/15/20 to [facility] intensive care unit. Patient is on low-dose Levophed to maintain MAP greater than 60. Versed and fentanyl for sedation. Patient is a good candidate for proning. Unfortunately the patient is not a good candidate for ECMO.  Her prolonged presentation in addition to her BMI greater than 40 makes her very poor candidate for ECMO to expect good pulmonary recovery. Continue Diamox for the idiopathic intracranial hypertension. Monitor I's/O's. Continue full dose Lovenox for PE  Best practice (evaluated daily)  Diet: N.p.o.  pain/Anxiety/Delirium protocol (if indicated):  VAP protocol (if indicated): Initiated DVT prophylaxis: Full dose Lovenox GI prophylaxis: Protonix Glucose control: Insulin sliding Mobility: Bedrest Disposition: Intensive care unit  Goals of Care:  * Code Status: Full  Labs   CBC: Recent Labs  Lab 04/09/20 0440 04/10/20 0448 04/11/20 0548 04/12/20 0535 04/13/20 0402 04/13/20 2232  WBC 4.6 5.9 5.4 4.0 4.9  --   HGB 10.0* 10.5* 10.1* 12.0 10.1* 11.2*  HCT 32.7* 34.3* 32.3* 38.6 32.4* 33.0*  MCV 92.9 92.2 90.0 91.3 91.5  --   PLT 82* 107* 98* 153 148*  --  Basic Metabolic Panel: Recent Labs  Lab 04/07/20 0936 04/08/20 0317 04/09/20 0440 04/10/20 0448 04/12/20 0535 04/13/20 1816 04/13/20 2232  NA 137 137 137 139 139  --  138  K 2.9* 3.6 3.7 3.6 3.5  --  3.6  CL 109 109 110 108 101  --   --   CO2 21* 21* 19* 25 27  --   --    GLUCOSE 109* 193* 198* 149* 178*  --   --   BUN 24* 17 19 26* 22*  --   --   CREATININE 1.03* 0.95 0.77 0.82 0.86  --   --   CALCIUM 7.9* 7.9* 8.1* 8.3* 8.8*  --   --   MG 2.1  --   --   --   --  2.2  --   PHOS  --   --   --   --   --  5.0*  --    GFR: Estimated Creatinine Clearance: 105.6 mL/min (by C-G formula based on SCr of 0.86 mg/dL). Recent Labs  Lab 04/10/20 0448 04/11/20 0548 04/12/20 0535 04/13/20 0402  WBC 5.9 5.4 4.0 4.9    Liver Function Tests: Recent Labs  Lab 04/07/20 0936 04/09/20 0440 04/12/20 0535  AST 91* 63* 53*  ALT 37 38 62*  ALKPHOS 69 61 100  BILITOT 0.7 0.5 0.5  PROT 5.1* 5.2* 6.5  ALBUMIN 2.4* 2.3* 2.8*   No results for input(s): LIPASE, AMYLASE in the last 168 hours. No results for input(s): AMMONIA in the last 168 hours.  ABG    Component Value Date/Time   PHART 7.305 (L) 04/13/2020 2232   PCO2ART 56.5 (H) 04/13/2020 2232   PO2ART 81 (L) 04/13/2020 2232   HCO3 28.0 04/13/2020 2232   TCO2 30 04/13/2020 2232   O2SAT 94.0 04/13/2020 2232     Coagulation Profile: Recent Labs  Lab 04/10/20 0448  INR 1.2    Cardiac Enzymes: No results for input(s): CKTOTAL, CKMB, CKMBINDEX, TROPONINI in the last 168 hours.  HbA1C: Hgb A1c MFr Bld  Date/Time Value Ref Range Status  04/02/2020 05:32 AM 6.6 (H) 4.8 - 5.6 % Final    Comment:    (NOTE) Pre diabetes:          5.7%-6.4%  Diabetes:              >6.4%  Glycemic control for   <7.0% adults with diabetes     CBG: Recent Labs  Lab 04/13/20 0824 04/13/20 1201 04/13/20 1322 04/13/20 1624 04/13/20 2225  GLUCAP 84 67* 135* 222* 238*    Review of Systems:   Unable to obtain  Past Medical History:  She,  has a past medical history of Anxiety, Arthritis, Chronic back pain, Chronic migraine, Constipation, Depression, Diabetes mellitus (HCC), Fibromyalgia, GERD (gastroesophageal reflux disease), History of bronchitis, History of cardiac catheterization, History of gastric ulcer,  History of hiatal hernia, History of pneumonia, Hypertension, Idiopathic intracranial hypertension, POTS (postural orthostatic tachycardia syndrome), and Seasonal allergies.   Surgical History:   Past Surgical History:  Procedure Laterality Date  . ABDOMINAL HYSTERECTOMY    . BACK SURGERY    . BLADDER SURGERY    . BREAST SURGERY Left    Lumpectomy  . CARDIAC CATHETERIZATION  2010  . CHOLECYSTECTOMY    . COLONOSCOPY  2003   Moderate external hemorrhoids with normal TI  . CYSTOSCOPY W/ URETERAL STENT REMOVAL    . ESOPHAGEAL DILATION     4-5 times  . ESOPHAGOGASTRODUODENOSCOPY  2003   Small sliding hiatal hernia, erosive gastritis  . ESOPHAGOGASTRODUODENOSCOPY  2004   Erosive reflux esophagitis with Schatzki ring s/p dilation, erosive antral gastritis  . ESOPHAGOGASTRODUODENOSCOPY  2006   Non-critical Schatzki ring proximal to GEJ, s/p 54 and 56 Maloney dilatation. ?Candida esophagitis  . ESOPHAGOGASTRODUODENOSCOPY  2009   Small hiatal hernia  . LUMBAR LAMINECTOMY/DECOMPRESSION MICRODISCECTOMY Left 01/11/2014   Procedure: LUMBAR LAMINECTOMY/DECOMPRESSION MICRODISCECTOMY 1 LEVEL LEFT LUMBAR TWO/THREE;  Surgeon: Tressie Stalker, MD;  Location: [facility];  Service: Neurosurgery;  Laterality: Left;  . NASAL SEPTOPLASTY W/ TURBINOPLASTY Bilateral 09/09/2019   Procedure: NASAL SEPTOPLASTY WITH BILATERAL TURBINATE REDUCTION;  Surgeon: Newman Pies, MD;  Location: [facility];  Service: ENT;  Laterality: Bilateral;  . TUBAL LIGATION    . ventrical atrium shunt  04/2018  . VENTRICULOPERITONEAL SHUNT Right 11/19/2015   Procedure: VENTRICULAR-PERITONEAL SHUNT RIGHT;  Surgeon: Tressie Stalker, MD;  Location: [facility];  Service: Neurosurgery;  Laterality: Right;  Right     Social History:   reports that she has never smoked. She has never used smokeless tobacco. She reports that she does not drink alcohol and does not use drugs.   Family History:  Her family history includes  Aortic aneurysm in an other family member; Coronary artery disease in her father; Diabetes in her mother; Hypertension in her father and mother; Kidney cancer in her mother; Lung cancer in her paternal grandmother; Pancreatic cancer in her maternal grandmother; Prostate cancer in her maternal grandfather; Thyroid disease in her mother; Ulcers in her father. There is no history of Colon cancer.   Allergies Allergies  Allergen Reactions  . Ambien [Zolpidem Tartrate] Rash and Other (See Comments)    Pt states she cannot walk after taking this medication  . Penicillins Hives and Swelling    Has patient had a PCN reaction causing immediate rash, facial/tongue/throat swelling, SOB or lightheadedness with hypotension: Yes Has patient had a PCN reaction causing severe rash involving mucus membranes or skin necrosis: Yes Has patient had a PCN reaction occurring within the last 10 years: Yes If all of the above answers are "NO", then may proceed with Cephalosporin use. Pt had ceftriaxone and ceftazidime 01/2014 and 2019  . Topamax [Topiramate] Other (See Comments)    Gives her kidney stones  . Hydrocodone Itching  . Chlorhexidine Gluconate Itching and Rash    "It felt bees were stinging me"  . Latex Rash  . Versed [Midazolam] Itching     Home Medications  Prior to Admission medications   Medication Sig Start Date End Date Taking? Authorizing Provider  acetaminophen (TYLENOL) 500 MG tablet Take 1,000 mg by mouth every 6 (six) hours as needed.   Yes [provider]  acetaZOLAMIDE (DIAMOX) 250 MG tablet Take 250 mg by mouth 2 (two) times daily. 11/18/19  Yes [provider]  acyclovir (ZOVIRAX) 200 MG capsule Take 200 mg by mouth daily. 03/09/20  Yes [provider]  albuterol (VENTOLIN HFA) 108 (90 Base) MCG/ACT inhaler Inhale into the lungs every 6 (six) hours as needed for wheezing or shortness of breath.   Yes [provider]  Ascorbic Acid (VITAMIN C) 1000 MG  tablet Take 2,000 mg by mouth daily.   Yes [provider]  atenolol (TENORMIN) 25 MG tablet Take 1 tablet (25 mg total) by mouth daily. 01/28/19 09/02/19 Yes Jonelle Sidle, MD  Azelastine HCl 137 MCG/SPRAY SOLN Place 2 puffs into both nostrils daily. 01/13/19  Yes [provider]  BREO ELLIPTA 100-25 MCG/INH AEPB Inhale 1 puff into the lungs daily. 01/13/19  Yes [provider]  butalbital-acetaminophen-caffeine (FIORICET, ESGIC) 50-325-40 MG tablet Take 1 tablet by mouth every 6 (six) hours as needed for headache. 04/06/15  Yes Vassie LollMadera, Carlos, MD  diazepam (VALIUM) 5 MG tablet Take 5 mg by mouth every 6 (six) hours as needed for anxiety.   Yes [provider]  diphenhydrAMINE (BENADRYL) 25 mg capsule Take 25 mg by mouth every 6 (six) hours as needed for itching.   Yes [provider]  FLUoxetine (PROZAC) 40 MG capsule Take 40 mg by mouth daily. 03/04/20  Yes [provider]  furosemide (LASIX) 20 MG tablet Take by mouth. 12/08/18  Yes [provider]  hydrochlorothiazide (HYDRODIURIL) 12.5 MG tablet Take 12.5 mg by mouth daily.  06/08/17  Yes [provider]  levocetirizine (XYZAL) 5 MG tablet Take 1 tablet by mouth daily. 06/21/19  Yes [provider]  meclizine (ANTIVERT) 25 MG tablet Take 1 tablet (25 mg total) by mouth 3 (three) times daily as needed for dizziness. 08/11/14  Yes Anson FretAhern, Antonia B, MD  Multiple Vitamins-Minerals (EMERGEN-C IMMUNE PO) Take 1 packet by mouth daily.   Yes [provider]  pantoprazole (PROTONIX) 40 MG tablet Take 1 tablet (40 mg total) by mouth 2 (two) times daily. 12/20/18  Yes Ermalinda MemosHarper, Kristen S, PA-C  pravastatin (PRAVACHOL) 40 MG tablet Take 40 mg by mouth daily.   Yes [provider]  predniSONE (STERAPRED UNI-PAK 21 TAB) 5 MG (21) TBPK tablet See admin instructions. 03/26/20  Yes [provider]  pregabalin (LYRICA) 300 MG capsule Take 150 mg by mouth at  bedtime.    Yes [provider]  promethazine (PHENERGAN) 25 MG tablet Take 25 mg by mouth every 6 (six) hours as needed for nausea or vomiting.   Yes [provider]  rizatriptan (MAXALT-MLT) 5 MG disintegrating tablet Take 5 mg by mouth daily as needed for migraine. May repeat in 2 hours if needed   Yes [provider]  Vitamin D, Cholecalciferol, 25 MCG (1000 UT) TABS Take 1 tablet by mouth daily as needed.   Yes [provider]  zinc gluconate 50 MG tablet Take 50 mg by mouth daily.   Yes [provider]  azithromycin (ZITHROMAX) 250 MG tablet Take by mouth. Patient not taking: No sig reported 03/26/20   [provider]  benzonatate (TESSALON) 100 MG capsule Take 200 mg by mouth every 8 (eight) hours as needed. 03/26/20   [provider]  doxycycline (ADOXA) 100 MG tablet Take 100 mg by mouth 2 (two) times daily. Patient not taking: No sig reported 01/24/20   [provider]  fluconazole (DIFLUCAN) 150 MG tablet Take 150 mg by mouth once. Patient not taking: No sig reported 10/13/19   [provider]  HYDROcodone-acetaminophen (NORCO/VICODIN) 5-325 MG tablet Take by mouth. Patient not taking: No sig reported 07/04/19   [provider]  hydrocortisone (ANUSOL-HC) 2.5 % rectal cream Place 1 application rectally 2 (two) times daily. Patient not taking: No sig reported 03/17/19   Letta MedianHarper, Kristen S, PA-C  levofloxacin (LEVAQUIN) 500 MG tablet Take 500 mg by mouth daily. Patient not taking: No sig reported 10/11/19   [provider]  lubiprostone (AMITIZA) 8 MCG capsule Take 1 capsule (8 mcg total) by mouth 2 (two) times daily with a meal. Patient not taking: No sig reported 12/20/18   Letta MedianHarper, Kristen S, PA-C  nystatin (MYCOSTATIN) 100000 UNIT/ML  suspension Take by mouth. Patient not taking: No sig reported 01/06/20   [provider]  tetrahydrozoline 0.05 % ophthalmic solution Place 1 drop into  both eyes as needed (For allergies). Patient not taking: Reported on Apr 16, 2020    [provider]  tiZANidine (ZANAFLEX) 2 MG tablet Take 2 mg by mouth 3 (three) times daily. Patient not taking: No sig reported 11/18/19   [provider]     Critical care time: 45 minutes

## 2020-04-13 NOTE — Procedures (Signed)
Central Venous Catheter Insertion Procedure Note  Sandra [name]  [id number]  1975-07-11  Date:04/13/20  Time:3:38 PM   Provider Performing:Elise Gladden V. Vassie Loll   Procedure: Insertion of Non-tunneled Central Venous [phone]) with US guidance (70263)   Indication(s) Difficult access  Consent Risks of the procedure as well as the alternatives and risks of each were explained to the patient and/or caregiver.  Consent for the procedure was obtained and is signed in the bedside chart  Anesthesia Topical only with 1% lidocaine   Timeout Verified patient identification, verified procedure, site/side was marked, verified correct patient position, special equipment/implants available, medications/allergies/relevant history reviewed, required imaging and test results available.  Sterile Technique Maximal sterile technique including full sterile barrier drape, hand hygiene, sterile gown, sterile gloves, mask, hair covering, sterile ultrasound probe cover (if used).  Procedure Description Area of catheter insertion was cleaned with chlorhexidine and draped in sterile fashion.  With real-time ultrasound guidance a central venous catheter was placed into the right internal jugular vein. Nonpulsatile blood flow and easy flushing noted in all ports.  The catheter was sutured in place and sterile dressing applied.  Complications/Tolerance None; patient tolerated the procedure well. Chest X-ray is ordered to verify placement for internal jugular or subclavian cannulation.   Chest x-ray is not ordered for femoral cannulation.  EBL Minimal  Specimen(s) None   Lankford Gutzmer V. Vassie Loll MD

## 2020-04-13 NOTE — Progress Notes (Signed)
NAME:  Sandra [name], MRN:  [id number], DOB:  [birth year], LOS: 14 ADMISSION DATE:  2020/04/16, CONSULTATION DATE:  04/13/2020  REFERRING MD:  TRH, courage, CHIEF COMPLAINT: Severe hypoxia  Brief History:  45 year old woman, unvaccinated admitted 2/4 with Covid pneumonia, progressed to hypoxic respiratory failure, on heated high flow nasal cannula since 2/10.  CT angiogram chest on 2/14 showed segmental right lower lobe pulmonary emboli, converted to therapeutic full dose Lovenox. PCCM consulted to assist with refractory hypoxia.  She has tolerated intermittent BiPAP  Past Medical History:  Pseudotumor cerebri status post VP shunt Anxiety, depression POTS with episodes of tachycardia and hypotension  Significant Hospital Events:  2/15 on intermittent BiPAP. 2/16 diuresed well with Lasix, severe hypoxia during transition from BiPAP to high flow , 2/17 BiPAP and high flow  Consults:    Procedures:    Significant Diagnostic Tests:  CT angiogram chest 2/4 extensive bilateral airspace disease. CT angiogram chest 2/14 segmental right lower lobe pulmonary emboli , progression of bilateral groundglass airspace disease Venous duplex 2/12 - bilateral neg Echo normal LV function, normal RV pressures  Micro Data:  MRSA PCR negative BC 2/4 ng  Antimicrobials:  Remdesivir 2/4 >>2/8  Interim History / Subjective:   Remains critically ill Now BiPAP dependent, 100%, 10/5 full facemask. Family at bedside    Objective   Blood pressure (!) 138/58, pulse 85, temperature 98 F (36.7 C), temperature source Oral, resp. rate 16, height 5\' 7"  (1.702 m), weight 127.9 kg, SpO2 90 %.    Vent Mode: PRVC FiO2 (%):  [100 %] 100 % Set Rate:  [18 bmp-24 bmp] 24 bmp Vt Set:  [490 mL] 490 mL PEEP:  [5 cmH20-15 cmH20] 15 cmH20 Plateau Pressure:  [32 cmH20] 32 cmH20   Intake/Output Summary (Last 24 hours) at 04/13/2020 1541 Last data filed at 04/13/2020 1300 Gross per 24 hour  Intake 750 ml   Output 2100 ml  Net -1350 ml   Filed Weights   04/06/20 0432 04/10/20 0622 04/11/20 0357  Weight: 117 kg 124.9 kg 127.9 kg    Examination: General: Middle-aged woman lying left lateral decubitus in bed, anxious, mild distress, on BiPAP  HENT: Mild pallor, no icterus, no JVD Lungs: Bilateral scattered crackles, mild accessory muscle use Cardiovascular: S1-S2 regular, no murmur Abdomen: Soft, nontender, no organomegaly Extremities: No deformity, no edema Neuro: Alert, interactive, nonfocal  Labs show improved thrombocytopenia. Chest x-ray post intubation shows ET tube in position, right IJ in position, bilateral diffuse airspace disease   Resolved Hospital Problem list     Assessment & Plan:  ARDS due to COVID pneumonia  -Continue baricitinib for 14 days. - continue Solu-Medrol  -Start low tidal volume ventilation per ARDS protocol Target TV 6-8cc/kgIBW Target Plateau Pressure < 30cm H20 Target driving pressure less than 15 cm of water Target PaO2 55-65: titrate PEEP/FiO2 per protocol Consider prone position 16h/day  while PaO2 to FiO2 ratio remains less than 1:150  Check CVP daily if CVL in place Target CVP less than 4, diurese as necessary -Consider paralytic if asynchrony - evaluate P/F ratio and decide about proning -If PEEP requirements increases, may have to consider ECMO, currently PEEP at 15 but expect to recruit and PEEP requirements to decrease  Sedation needs for mechanical ventilation -Use fentanyl drip with goal RASS -3 to -4 -Due to soft blood pressure, doubt she will tolerate propofol and will need to use Versed infusion if needed  Acute segmental PE -appears to be in situ thrombus rather than  embolic -Continue full dose Lovenox, platelet count improving  Mild hypotension -resolved with fluids, likely due to overdiuresis, no evidence of bleeding or sepsis  Mildly elevated LFTs -since admit  -Improved , follow intermittently. Start tube feeds  Best  practice (evaluated daily)  Diet: Full Pain/Anxiety/Delirium protocol (if indicated): N/A VAP protocol (if indicated): N/A DVT prophylaxis: Full dose Lovenox GI prophylaxis: N/A Glucose control: SSI Mobility: Out of bed as tolerated Disposition: ICU , transfer to [facility] planned,  Goals of Care:   Code Status: Full  Labs   CBC: Recent Labs  Lab 04/09/20 0440 04/10/20 0448 04/11/20 0548 04/12/20 0535 04/13/20 0402  WBC 4.6 5.9 5.4 4.0 4.9  HGB 10.0* 10.5* 10.1* 12.0 10.1*  HCT 32.7* 34.3* 32.3* 38.6 32.4*  MCV 92.9 92.2 90.0 91.3 91.5  PLT 82* 107* 98* 153 148*    Basic Metabolic Panel: Recent Labs  Lab 04/07/20 0936 04/08/20 0317 04/09/20 0440 04/10/20 0448 04/12/20 0535  NA 137 137 137 139 139  K 2.9* 3.6 3.7 3.6 3.5  CL 109 109 110 108 101  CO2 21* 21* 19* 25 27  GLUCOSE 109* 193* 198* 149* 178*  BUN 24* 17 19 26* 22*  CREATININE 1.03* 0.95 0.77 0.82 0.86  CALCIUM 7.9* 7.9* 8.1* 8.3* 8.8*  MG 2.1  --   --   --   --    GFR: Estimated Creatinine Clearance: 114.9 mL/min (by C-G formula based on SCr of 0.86 mg/dL). Recent Labs  Lab 04/10/20 0448 04/11/20 0548 04/12/20 0535 04/13/20 0402  WBC 5.9 5.4 4.0 4.9    Liver Function Tests: Recent Labs  Lab 04/07/20 0936 04/09/20 0440 04/12/20 0535  AST 91* 63* 53*  ALT 37 38 62*  ALKPHOS 69 61 100  BILITOT 0.7 0.5 0.5  PROT 5.1* 5.2* 6.5  ALBUMIN 2.4* 2.3* 2.8*   No results for input(s): LIPASE, AMYLASE in the last 168 hours. No results for input(s): AMMONIA in the last 168 hours.  ABG No results found for: PHART, PCO2ART, PO2ART, HCO3, TCO2, ACIDBASEDEF, O2SAT   Coagulation Profile: Recent Labs  Lab 04/10/20 0448  INR 1.2    Patient critically ill due to ARDS Interventions to address this today mechanical ventilation adjustments, sedation Risk of deterioration without these interventions is high  I personally spent 45 minutes providing critical care not including any separately billable  procedures     Cyril Mourning MD. FCCP. [facility] Pager : [phone]  If no response to pager , please call [phone] until 7 pm After 7:00 pm call Elink  [phone]   04/13/2020

## 2020-04-13 NOTE — Progress Notes (Signed)
Suctioned patient at beginning of shift during my vent check.  Patient immediately started to desat from coughing up secretions.  Took patient a couple of minutes to recover.  Patient went into 58s, but now is 94%.

## 2020-04-13 NOTE — Progress Notes (Signed)
PT Cancellation Note  Patient Details Name: Felissa Blouch MRN: [id number] DOB: Aug 05, 1975   Cancelled Treatment:    Reason Eval/Treat Not Completed: Medical issues which prohibited therapy.  Physical therapy held secondary to patient to be possibly intubated and transferred to [facility].   2:25 PM, 04/13/20 Ocie Bob, MPT Physical Therapist with [facility] 336 [phone] office [phone] mobile phone

## 2020-04-13 NOTE — Progress Notes (Signed)
Referral from RN during Rounding on unit today. Learned that patient would be intubated this afternoon and transferred to a network hospital for higher level of care. Family present bedside included daughter, son, and fiance.  Patient awake and struggling with anxiety and inability to breathe. Chaplain provided prayer, spiritual support, and affirmation of faith bedside. Patient stated that she is scared to be intubated and that she is generally scared of her outcome today. She is hopeful that medical interventions will assist her in healing. Family and patient thankful for prayer and encouragement. [place] will remain available as needed or requested in order to provide spiritual support and to assess for spiritual need.

## 2020-04-13 NOTE — Progress Notes (Signed)
Patient has not rested well throughout night tonight. PRN medications provided at bedtime. Patient has removed bipap mask several times throughout night with O2 saturation dropping into 50's without it. Educated patient on importance of keeping mask in place for as long as possible. Provided patient PRN tussionex per request. Patient now has bipap in place with O2 between 92-99.

## 2020-04-13 NOTE — Progress Notes (Signed)
eLink Physician-Brief Progress Note Patient Name: Sandra [name] DOB: 07-12-1975 MRN: [id number]   Date of Service  04/13/2020  HPI/Events of Note  45 yo F with acute hypoxemic respiratory failure secondary to Covid 19 pneumonia, intubated and mechanically ventilated, she also has PE, acute kidney injury, and UTI.  eICU Interventions  New Patient Evaluation completed,  Order to prone patient entered, she meets criteria.        Sandra [name] 04/13/2020, 11:06 PM

## 2020-04-13 NOTE — Progress Notes (Signed)
[facility] on site to transport patient. All patient belongings given to transport team. VS are as follows 125/52 O2 89% HR 63. Report given to [facility] on [facility]. No questions or concerns voiced. Daughter Sandra [name] updated over the telephone on patient's condition, and her imminent transfer.

## 2020-04-13 NOTE — Progress Notes (Signed)
RT removed patient from BIPAP and placed on Heated High Flow 100% FIO2 , 40L Flow and placed on nonrebreather. Patient appears to be tolerating SATs 91%, HR 75 and RR 18. RT will continue to monitor and assess.

## 2020-04-13 NOTE — Progress Notes (Signed)
Pt arrived via critical care mobile team, on vent and Versed, Fent., Levo gtts.  RT at bedside to assist with vent.  Pt placed on monitor.  Awakens to minimal stimuli.  COVID precautions in place.  CCM paged and at bedside.  POC continued.

## 2020-04-13 NOTE — Progress Notes (Signed)
Inpatient Diabetes Program Recommendations  AACE/ADA: New Consensus Statement on Inpatient Glycemic Control (2015)  Target Ranges:  Prepandial:   less than 140 mg/dL      Peak postprandial:   less than 180 mg/dL (1-2 hours)      Critically ill patients:  140 - 180 mg/dL   Lab Results  Component Value Date   GLUCAP 67 (L) 04/13/2020   HGBA1C 6.6 (H) 04/02/2020    Review of Glycemic Control Results for Sandra [name], Sandra [name] (MRN [id number]) as of 04/13/2020 12:25  Ref. Range 04/12/2020 07:52 04/12/2020 11:51 04/12/2020 16:41 04/12/2020 21:20 04/13/2020 08:24 04/13/2020 12:01  Glucose-Capillary Latest Ref Range: 70 - 99 mg/dL 808 (H) 95 811 (H) 031 (H) 84 67 (L)   Diabetes history: None  Current orders for Inpatient glycemic control:  Levemir 14 units bid Novolog 0-15 units tid + hs  Ensure Enlive 4x/daily Solumedrol 60 mg Q12 hours  Inpatient Diabetes Program Recommendations:    Pt has Levemir 14 units bid ordered but has been only getting it qhs for the past 2 days.  Hypoglycemia for lunch 67 today.  -  Reduce Levemir to 12 units qhs.  Thanks,  Christena Deem RN, MSN, BC-ADM Inpatient Diabetes Coordinator Team Pager [phone] (8a-5p)

## 2020-04-13 NOTE — Procedures (Signed)
Intubation Procedure Note  Divya Munshi  [id number]  20-Sep-1975  Date:04/13/20  Time:3:37 PM   Provider Performing:Tyquisha Sharps V. Tasfia Vasseur    Procedure: Intubation (31500)  Indication(s) Respiratory Failure  Consent Risks of the procedure as well as the alternatives and risks of each were explained to the patient and/or caregiver.  Consent for the procedure was obtained and is signed in the bedside chart   Anesthesia Etomidate, Versed, Fentanyl and Rocuronium   Time Out Verified patient identification, verified procedure, site/side was marked, verified correct patient position, special equipment/implants available, medications/allergies/relevant history reviewed, required imaging and test results available.   Sterile Technique Usual hand hygeine, masks, and gloves were used   Procedure Description Patient positioned in bed supine.  Sedation given as noted above.  Patient was intubated with endotracheal tube using Glidescope.  View was Grade 1 full glottis .  Number of attempts was 1.  Colorimetric CO2 detector was consistent with tracheal placement.   Complications/Tolerance None; patient tolerated the procedure well. Desaturation post procedure to 50% but recruited over 2 mins with PEEP Chest X-ray is ordered to verify placement.   EBL Minimal   Specimen(s) None  Amelie Caracci V. Vassie Loll MD

## 2020-04-13 NOTE — Progress Notes (Signed)
Nutrition Follow-up  DOCUMENTATION CODES:   Morbid obesity  INTERVENTION:  D/c Prosource Plus D/c Ensure Enlive  -Vital 1.2 @ 30 ml/hr, advance 10 ml every 8 hours to goal rate of 70 ml/hr (1680 ml/day) -Prosource TF 45 ml TID via tube  Regimen at goal rate provides 2136 kcal, 159 grams protein, and 1361 ml free water  NUTRITION DIAGNOSIS:   Increased nutrient needs related to catabolic illness (YIRSW-54 infection) as evidenced by estimated needs. -ongoing  GOAL:   Patient will meet greater than or equal to 90% of their needs -unmet, starting TF  MONITOR:   Labs,I & O's,Skin,Supplement acceptance,PO intake,Weight trends  REASON FOR ASSESSMENT:   Consult Enteral/tube feeding initiation and management  ASSESSMENT:   45 year old female admitted with acute respiratory failure with hypoxia secondary to COVID-19 pneumonia. Past medical history of GERD, vertigo, depression, anxiety, pseudomotor cerebri, benign intracranial hypertension with VP shunt presented with worsening SOB, cough, fatigue, and body aches since testing positive for Covid 7 days ago with home kit.  Admit 2/4  Pt is s/p intubation on 2/18 after recurrent episodes of desaturation on BiPAP and increased work of breathing  Meal intake has been very poor, 0-50% of the last 8 documented meals. (12% average). Pt has had poor tolerance to multiple supplements offered. She is at risk for refeeding, will need to monitor magnesium, potassium, and phosphorus daily as tube feeds progress towards goal and labs stable.   Weight 127.9 kg on 2/16 up 4.6 kg from 123.3 kg on 2/05; diuresing well; non-pitting BLE edema  I/Os: -1573.6 ml since admit UOP: 2100 ml x 24 hrs  Patient is currently sedated and intubated on ventilator support MV: 14.1 L/min No data recorded.  Propofol: none  Medications reviewed and include: Colace, Miralax, SSI, Levemir, Methylprednisolone, Protonix,  Drips:   Levoquin Fentanyl Versed Levo  Labs: CBGs 222,135,67, BUN 22 2/15 BNP 272  Diet Order:   Diet Order            Diet NPO time specified  Diet effective now                 EDUCATION NEEDS:   Not appropriate for education at this time  Skin:  Skin Assessment: Reviewed RN Assessment  Last BM:  2/13  Height:   Ht Readings from Last 1 Encounters:  04/13/20 '5\' 7"'  (1.702 m)    Weight:   Wt Readings from Last 1 Encounters:  04/11/20 127.9 kg    BMI:  Body mass index is 44.16 kg/m.  Estimated Nutritional Needs:   Kcal:  6270-3500  Protein:  140-154  Fluid:  >2.2 L   Lajuan Lines, RD, LDN Clinical Nutrition After Hours/Weekend Pager # in [place]

## 2020-04-13 NOTE — Progress Notes (Signed)
Gave report on patient to [place], RRT on 2H.

## 2020-04-13 NOTE — Progress Notes (Addendum)
PROGRESS NOTE    Sandra [name]  JEH:[id number] DOB: Nov 05, 1975 DOA: 04/09/2020 PCP: Glenda Chroman, MD   Chief Complaint  Patient presents with  . Shortness of Breath    Brief admission Narrative:  Sandra [name]  is a 45 y.o. female, nickel history of GERD,vertigo, depression, anxiety, pseudotumor cerebri, benign intracranial hypertension, with VP shunt, patient is unvaccinated against Covid, admitted on 04/05/2020 with acute hypoxic respiratory failure with O2 sats down to 73% on room air on admission---and found to have Covid 19 respiratory infection/pneumonia-  CTA chest negative for PE on admission--- and she required high flow nasal cannula.  Assessment & Plan:  1) Acute Respiratory Failure with Hypoxia secondary to COVID-19 Pneumonia -Patient was treated with  iv Steroids and po Baricitinib. -Completed Remdesivir already -Continue as needed antitussive medications, vitamin C and zinc. 04/13/20 -Intubated on 04/13/2020 after recurrent episodes of desaturation on BiPAP and increased work of breathing --Serial CXR on 04/11/20, 04/13/20--with worsening Covid pneumonia findings -CTA chest on 04/09/2020 with finding of pulmonary embolism and concern for possible ARDS versus edema -Patient diuresed very well with low-dose Lasix - 2)Acute PE--- therapeutic Lovenox as ordered, watch platelets closely --LE Venous Doppler w/o acute findings Echo with preserved EF and largely unremarkable, no right heart strain -Hemoglobin and platelets continues to improve and have mostly normalized at this time  3)ARDS Vs Pulm Edema--IV Lasix as ordered -Diuresing well, BNP 272   PCCM consult appreciated -Profound hypoxia persist--intubated 04/13/2020 -Echo with preserved EF  4)Episodes of Transient Tachycardia and Transient Hypotension--- patient has longstanding history of POTS--- Improved with IVFs -Avoid further IV fluids due to concerns for volume overload  5) E faecalis UTI --- CAUTI-- not POA --  no further fevers---,  -Foley catheter removed 04/10/2020,  -Foley replaced on 04/13/2020 after intubation -Initially treated with IV Rocephin, transition to Levaquin for a faecalis  -Patient had Foley catheter initially due to hemodynamic instability with hypotension and tachycardia requiring aggressive fluid boluses in the setting of severe COVID-19 infection with profound persistent hypoxia making it difficult for patient to get out of bed and need for accurate input and output monitoring  6)-Depression/Anxiety Previously on Prozac, continue Xanax -patient is now intubated and sedated -  7)-GERD (gastroesophageal reflux disease) -Continue PPI.  8)-Pseudotumor cerebri -Continue Diamox -Patient denies headaches prior to intubation  9)-acute kidney injury -In the setting of prerenal azotemia, dehydration and transient hypotension -Stopped HCTZ  --- renally adjust medications, avoid nephrotoxic agents / dehydration  / hypotension -Renal function normalized -Renal function closely with reinitiation of IV Lasix  10)-mild transaminitis ; most likely in the setting of acute COVID infection -Holding statins currently. -Alk phos and T bili WNL Hepatic Function Latest Ref Rng & Units 04/12/2020 04/09/2020 04/07/2020  Total Protein 6.5 - 8.1 g/dL 6.5 5.2(L) 5.1(L)  Albumin 3.5 - 5.0 g/dL 2.8(L) 2.3(L) 2.4(L)  AST 15 - 41 U/L 53(H) 63(H) 91(H)  ALT 0 - 44 U/L 62(H) 38 37  Alk Phosphatase 38 - 126 U/L 100 61 69  Total Bilirubin 0.3 - 1.2 mg/dL 0.5 0.5 0.7    11-history of migraines -Previously on Fioricet and rizatriptan, now intubated and sedated  12-history of COPD -Continue   inhaler regimen. -Steroids treatment as  above,   13-type 2 diabetes with hyperglycemia -No prior history of diabetes or using hypoglycemic agents prior to admission -Steroids therapy causing hyperglycemia -Continue sliding scale insulin and adjusted dose of Levemir -Continue to follow CBGs and adjust  hypoglycemic regimen as required.  14)-Morbid obesity (HCC) -Body mass index is 40.4 kg/m. --With concerns for obesity hypoventilation syndrome, sleep apnea;  ----Will most likely need Bipap post discharge  15)Acute Thrombocytopenia and Acute Anemia--- suspect due to severe infection --Hemoglobin and platelets continues to improve and have mostly normalized at this time -No obvious  bleeding noted - watch closely especially while on Lovenox  16)FEN--we will get dietitian consult for tube feeding   DVT prophylaxis:Lovenox-therapeutic dose Code Status: Full code. Family Communication: Patient's daughter- Herbert Spires and pt's son Laverna Peace Disposition:   Status is: Inpatient  Dispo: The patient is from: Home              Anticipated d/c is to: ?? LTAC Vs SNF              Anticipated d/c date is: To be determined              Patient currently not medically stable for discharge; --currently intubated and sedated Consultants:   PCCM   Procedures:  See below for x-ray reports. -Intubation on 04/13/2020-by Dr. Kara Mead -Right IJ central line on 04/13/2020-by Dr. Elsworth Soho  Antimicrobials/antiviral Remdesivir 5 out of 5  Subjective:  04/13/20 -Continued to have episodes of desaturation on BiPAP--- -intubated by Dr. Elsworth Soho   Objective: Vitals:   04/13/20 0700 04/13/20 0816 04/13/20 1300 04/13/20 1638  BP: (!) 138/58     Pulse: 85     Resp: 16     Temp:      TempSrc:      SpO2: 90% 90%  93%  Weight:      Height:   '5\' 7"'  (1.702 m)     Intake/Output Summary (Last 24 hours) at 04/13/2020 1658 Last data filed at 04/13/2020 1300 Gross per 24 hour  Intake 750 ml  Output 2100 ml  Net -1350 ml   Filed Weights   04/06/20 0432 04/10/20 0622 04/11/20 0357  Weight: 117 kg 124.9 kg 127.9 kg    Examination:  Physical Exam Gen:-Now intubated and sedated HEENT:- ET and OGt Neck- Rt IJ catheter Lungs-diminished patient with bibasilar rales  CV- S1, S2 normal Abd-  +ve B.Sounds, Abd  Soft, ND, increased truncal adiposity  Extremity/Skin:- No  edema,   good pulses NeuroPsych-exam limited as patient is now intubated and sedated GU- foley cath  Data Reviewed: I have personally reviewed following labs and imaging studies  CBC: Recent Labs  Lab 04/09/20 0440 04/10/20 0448 04/11/20 0548 04/12/20 0535 04/13/20 0402  WBC 4.6 5.9 5.4 4.0 4.9  HGB 10.0* 10.5* 10.1* 12.0 10.1*  HCT 32.7* 34.3* 32.3* 38.6 32.4*  MCV 92.9 92.2 90.0 91.3 91.5  PLT 82* 107* 98* 153 148*    Basic Metabolic Panel: Recent Labs  Lab 04/07/20 0936 04/08/20 0317 04/09/20 0440 04/10/20 0448 04/12/20 0535  NA 137 137 137 139 139  K 2.9* 3.6 3.7 3.6 3.5  CL 109 109 110 108 101  CO2 21* 21* 19* 25 27  GLUCOSE 109* 193* 198* 149* 178*  BUN 24* 17 19 26* 22*  CREATININE 1.03* 0.95 0.77 0.82 0.86  CALCIUM 7.9* 7.9* 8.1* 8.3* 8.8*  MG 2.1  --   --   --   --     GFR: Estimated Creatinine Clearance: 114.9 mL/min (by C-G formula based on SCr of 0.86 mg/dL).  Liver Function Tests: Recent Labs  Lab 04/07/20 0936 04/09/20 0440 04/12/20 0535  AST 91* 63* 53*  ALT 37 38 62*  ALKPHOS 69 61 100  BILITOT 0.7  0.5 0.5  PROT 5.1* 5.2* 6.5  ALBUMIN 2.4* 2.3* 2.8*    CBG: Recent Labs  Lab 04/12/20 2120 04/13/20 0824 04/13/20 1201 04/13/20 1322 04/13/20 1624  GLUCAP 147* 84 67* 135* 222*     Recent Results (from the past 240 hour(s))  Urine Culture     Status: Abnormal (Preliminary result)   Collection Time: 04/10/20  3:20 PM   Specimen: Urine, Catheterized  Result Value Ref Range Status   Specimen Description   Final    URINE, CATHETERIZED Performed at [facility], [address], [place], [place] [address]    Special Requests   Final    Normal Performed at [facility], [address], [place], [place] [address]    Culture (A)  Final    >=100,000 COLONIES/mL ENTEROCOCCUS FAECALIS SUSCEPTIBILITIES TO FOLLOW Performed at [facility], [place] [address] [place],  [place] [address]    Report Status PENDING  Incomplete  Culture, blood (Routine X 2) w Reflex to ID Panel     Status: None (Preliminary result)   Collection Time: 04/10/20  9:16 PM   Specimen: BLOOD LEFT HAND  Result Value Ref Range Status   Specimen Description BLOOD LEFT HAND  Final   Special Requests   Final    BOTTLES DRAWN AEROBIC AND ANAEROBIC Blood Culture adequate volume   Culture   Final    NO GROWTH 3 DAYS Performed at [facility], [address] [place], [place] [address]    Report Status PENDING  Incomplete  Culture, blood (Routine X 2) w Reflex to ID Panel     Status: None (Preliminary result)   Collection Time: 04/10/20  9:18 PM   Specimen: BLOOD RIGHT HAND  Result Value Ref Range Status   Specimen Description BLOOD RIGHT HAND  Final   Special Requests   Final    BOTTLES DRAWN AEROBIC ONLY Blood Culture results may not be optimal due to an inadequate volume of blood received in culture bottles   Culture   Final    NO GROWTH 3 DAYS Performed at [facility], [address] [place], [place] [address]    Report Status PENDING  Incomplete    Radiology Studies: Portable Chest x-ray  Result Date: 04/13/2020 CLINICAL DATA:  Intubated, history of COVID-19 pneumonia EXAM: PORTABLE CHEST 1 VIEW COMPARISON:  04/11/2020 FINDINGS: Three frontal views of the chest demonstrate endotracheal tube overlying tracheal air column, tip 1.8 cm above carina. Enteric catheter passes below diaphragm, tip and side port projecting over gastric fundus. Bilateral internal jugular catheters overlying the brachiocephalic confluence. Cardiac silhouette is stable. Progressive bilateral airspace disease most pronounced at the left lung base. No effusion or pneumothorax. No acute bony abnormalities. IMPRESSION: 1. Support devices as above.  No complication after intubation. 2. Multifocal bilateral airspace disease, with progression of left lower lobe consolidation since prior study. Electronically Signed   By:  Randa Ngo M.D.   On: 04/13/2020 15:11   Scheduled Meds: . (feeding supplement) PROSource Plus  30 mL Oral TID BM  . acetaZOLAMIDE  250 mg Oral BID  . acyclovir  200 mg Oral Daily  . vitamin C  500 mg Oral Daily  . azelastine  2 spray Each Nare Daily  . cholecalciferol  25 mcg Oral Daily  . docusate  100 mg Per Tube BID  . enoxaparin (LOVENOX) injection  120 mg Subcutaneous Q12H  . feeding supplement  237 mL Oral QID  . fentaNYL      .  fentaNYL (SUBLIMAZE) injection  50 mcg Intravenous Once  . FLUoxetine  40 mg Oral Daily  . fluticasone furoate-vilanterol  1 puff Inhalation Daily  . furosemide  20 mg Intravenous Daily  . insulin aspart  0-15 Units Subcutaneous TID WC  . insulin aspart  0-5 Units Subcutaneous QHS  . insulin detemir  14 Units Subcutaneous BID  . loratadine  10 mg Oral Daily  . methocarbamol  500 mg Oral TID  . methylPREDNISolone (SOLU-MEDROL) injection  60 mg Intravenous Q12H  . midazolam      . nystatin  5 mL Oral QID  . pantoprazole  40 mg Oral BID  . pantoprazole (PROTONIX) IV  40 mg Intravenous Daily  . polyethylene glycol  17 g Per Tube Daily  . pregabalin  150 mg Oral QHS  . saccharomyces boulardii  250 mg Oral BID  . zinc sulfate  220 mg Oral Daily   Continuous Infusions: . fentaNYL infusion INTRAVENOUS    . levofloxacin (LEVAQUIN) IV 500 mg (04/13/20 1245)  . midazolam    . norepinephrine    . norepinephrine (LEVOPHED) Adult infusion      LOS: 14 days   Roxan Hockey, MD [facility]  To contact the attending provider between 7A-7P or the covering provider during after hours 7P-7A, please log into the web site [email or web address] and access using universal [place] password for that web site. If you do not have the password, please call the hospital operator.  04/13/2020, 4:58 PM

## 2020-04-13 NOTE — Progress Notes (Signed)
Assumed care of patient this AM. Patient on bipap 100%.   Intubated today at 1415 approx. MD verbal orders for RSI override pull. 50mg  of Rocuronium, 20mg  Etomidate, 2mg  versed and of fentanyl giver per MD bedside order. Patient difficult to ventilate, and ventilator support increased to get Spo2 up to 89%.   1L of NS bolus given per MD bedside order for hypotension. Order for Levophed placed. Started on 68mcg/min and titrated up to 71mcg/min.  Patient requires large amounts of sedation because of ventilator compliance. Versed increased to 10mg /hr, fentanyl 300 mcg/hr and patient was still opening her eyes to voice. MD aware. Plan for deep sedation to allow for ventilator compliance.   Family updated. Report and sign off to night shift RN.   Waiting for transport at this time to [facility] 2 ICU cardiac

## 2020-04-14 ENCOUNTER — Inpatient Hospital Stay (HOSPITAL_COMMUNITY): Payer: Medicare Other

## 2020-04-14 DIAGNOSIS — U071 COVID-19: Secondary | ICD-10-CM | POA: Diagnosis not present

## 2020-04-14 DIAGNOSIS — J1282 Pneumonia due to coronavirus disease 2019: Secondary | ICD-10-CM | POA: Diagnosis not present

## 2020-04-14 LAB — GLUCOSE, CAPILLARY
Glucose-Capillary: 231 mg/dL — ABNORMAL HIGH (ref 70–99)
Glucose-Capillary: 219 mg/dL — ABNORMAL HIGH (ref 70–99)
Glucose-Capillary: 217 mg/dL — ABNORMAL HIGH (ref 70–99)
Glucose-Capillary: 217 mg/dL — ABNORMAL HIGH (ref 70–99)

## 2020-04-14 LAB — URINE CULTURE
Culture: >=100000 — AB
Special Requests: NORMAL

## 2020-04-14 LAB — POCT I-STAT 7, (LYTES, BLD GAS, ICA,H+H)
Acid-Base Excess: 9 mmol/L — ABNORMAL HIGH (ref 0.0–2.0)
Bicarbonate: 33.2 mmol/L — ABNORMAL HIGH (ref 20.0–28.0)
Calcium, Ion: 1.2 mmol/L (ref 1.15–1.40)
HCT: 27 % — ABNORMAL LOW (ref 36.0–46.0)
Hemoglobin: 9.2 g/dL — ABNORMAL LOW (ref 12.0–15.0)
O2 Saturation: 100 %
Patient temperature: 98.6
Potassium: 2.9 mmol/L — ABNORMAL LOW (ref 3.5–5.1)
Sodium: 146 mmol/L — ABNORMAL HIGH (ref 135–145)
TCO2: 35 mmol/L — ABNORMAL HIGH (ref 22–32)
pCO2 arterial: 44.7 mmHg (ref 32.0–48.0)
pH, Arterial: 7.479 — ABNORMAL HIGH (ref 7.350–7.450)
pO2, Arterial: 188 mmHg — ABNORMAL HIGH (ref 83.0–108.0)

## 2020-04-14 LAB — COMPREHENSIVE METABOLIC PANEL
ALT: 195 U/L — ABNORMAL HIGH (ref 0–44)
AST: 78 U/L — ABNORMAL HIGH (ref 15–41)
Albumin: 2.3 g/dL — ABNORMAL LOW (ref 3.5–5.0)
Alkaline Phosphatase: 159 U/L — ABNORMAL HIGH (ref 38–126)
Anion gap: 9 (ref 5–15)
BUN: 10 mg/dL (ref 6–20)
CO2: 28 mmol/L (ref 22–32)
Calcium: 8.4 mg/dL — ABNORMAL LOW (ref 8.9–10.3)
Chloride: 106 mmol/L (ref 98–111)
Creatinine, Ser: 0.85 mg/dL (ref 0.44–1.00)
GFR, Estimated: >60 mL/min (ref >60)
Glucose, Bld: 222 mg/dL — ABNORMAL HIGH (ref 70–99)
Potassium: 3.4 mmol/L — ABNORMAL LOW (ref 3.5–5.1)
Sodium: 143 mmol/L (ref 135–145)
Total Bilirubin: 1 mg/dL (ref 0.3–1.2)
Total Protein: 5.8 g/dL — ABNORMAL LOW (ref 6.5–8.1)

## 2020-04-14 LAB — BASIC METABOLIC PANEL
Anion gap: 12 (ref 5–15)
BUN: 15 mg/dL (ref 6–20)
CO2: 27 mmol/L (ref 22–32)
Calcium: 8.5 mg/dL — ABNORMAL LOW (ref 8.9–10.3)
Chloride: 104 mmol/L (ref 98–111)
Creatinine, Ser: 0.9 mg/dL (ref 0.44–1.00)
GFR, Estimated: >60 mL/min (ref >60)
Glucose, Bld: 222 mg/dL — ABNORMAL HIGH (ref 70–99)
Potassium: 3.4 mmol/L — ABNORMAL LOW (ref 3.5–5.1)
Sodium: 143 mmol/L (ref 135–145)

## 2020-04-14 LAB — CBC
HCT: 34.8 % — ABNORMAL LOW (ref 36.0–46.0)
Hemoglobin: 10.8 g/dL — ABNORMAL LOW (ref 12.0–15.0)
MCH: 28 pg (ref 26.0–34.0)
MCHC: 31 g/dL (ref 30.0–36.0)
MCV: 90.2 fL (ref 80.0–100.0)
Platelets: 211 10*3/uL (ref 150–400)
RBC: 3.86 MIL/uL — ABNORMAL LOW (ref 3.87–5.11)
RDW: 15.2 % (ref 11.5–15.5)
WBC: 8.3 10*3/uL (ref 4.0–10.5)
nRBC: 0 % (ref 0.0–0.2)

## 2020-04-14 LAB — MAGNESIUM: Magnesium: 2.2 mg/dL (ref 1.7–2.4)

## 2020-04-14 LAB — MRSA PCR SCREENING: MRSA by PCR: NEGATIVE

## 2020-04-14 LAB — PHOSPHORUS: Phosphorus: 3.6 mg/dL (ref 2.5–4.6)

## 2020-04-14 LAB — TRIGLYCERIDES: Triglycerides: 328 mg/dL — ABNORMAL HIGH (ref <150)

## 2020-04-14 MED ORDER — INSULIN ASPART 100 UNIT/ML ~~LOC~~ SOLN
0.0000 [IU] | SUBCUTANEOUS | Status: DC
  Administered 2020-04-14 (×3): 7 [IU] via SUBCUTANEOUS
  Administered 2020-04-14 – 2020-04-15 (×6): 4 [IU] via SUBCUTANEOUS
  Administered 2020-04-15: 7 [IU] via SUBCUTANEOUS
  Administered 2020-04-16 (×2): 4 [IU] via SUBCUTANEOUS
  Administered 2020-04-16: 11 [IU] via SUBCUTANEOUS
  Administered 2020-04-16: 7 [IU] via SUBCUTANEOUS
  Administered 2020-04-16: 4 [IU] via SUBCUTANEOUS
  Administered 2020-04-16 – 2020-04-17 (×2): 11 [IU] via SUBCUTANEOUS
  Administered 2020-04-17: 7 [IU] via SUBCUTANEOUS
  Administered 2020-04-17: 11 [IU] via SUBCUTANEOUS
  Administered 2020-04-17 (×3): 4 [IU] via SUBCUTANEOUS
  Administered 2020-04-17: 11 [IU] via SUBCUTANEOUS
  Administered 2020-04-18 (×3): 7 [IU] via SUBCUTANEOUS
  Administered 2020-04-18 (×3): 11 [IU] via SUBCUTANEOUS
  Administered 2020-04-19: 15 [IU] via SUBCUTANEOUS
  Administered 2020-04-19: 4 [IU] via SUBCUTANEOUS
  Administered 2020-04-19: 11 [IU] via SUBCUTANEOUS
  Administered 2020-04-19: 7 [IU] via SUBCUTANEOUS
  Administered 2020-04-19: 15 [IU] via SUBCUTANEOUS
  Administered 2020-04-19: 7 [IU] via SUBCUTANEOUS
  Administered 2020-04-20: 15 [IU] via SUBCUTANEOUS
  Administered 2020-04-20 (×2): 11 [IU] via SUBCUTANEOUS
  Administered 2020-04-20: 15 [IU] via SUBCUTANEOUS
  Administered 2020-04-20: 7 [IU] via SUBCUTANEOUS
  Administered 2020-04-21: 20 [IU] via SUBCUTANEOUS
  Administered 2020-04-21 (×2): 7 [IU] via SUBCUTANEOUS
  Administered 2020-04-21: 11 [IU] via SUBCUTANEOUS
  Administered 2020-04-21: 15 [IU] via SUBCUTANEOUS
  Administered 2020-04-21 – 2020-04-22 (×2): 7 [IU] via SUBCUTANEOUS
  Administered 2020-04-22: 4 [IU] via SUBCUTANEOUS
  Administered 2020-04-22 (×2): 15 [IU] via SUBCUTANEOUS
  Administered 2020-04-22: 7 [IU] via SUBCUTANEOUS
  Administered 2020-04-22: 11 [IU] via SUBCUTANEOUS
  Administered 2020-04-23: 20 [IU] via SUBCUTANEOUS
  Administered 2020-04-23: 7 [IU] via SUBCUTANEOUS
  Administered 2020-04-23: 4 [IU] via SUBCUTANEOUS
  Administered 2020-04-23: 11 [IU] via SUBCUTANEOUS
  Administered 2020-04-23: 7 [IU] via SUBCUTANEOUS

## 2020-04-14 MED ORDER — PROSOURCE TF PO LIQD
90.0000 mL | Freq: Two times a day (BID) | ORAL | Status: DC
  Administered 2020-04-14 – 2020-04-24 (×20): 90 mL
  Filled 2020-04-14 (×20): qty 90

## 2020-04-14 MED ORDER — INSULIN DETEMIR 100 UNIT/ML ~~LOC~~ SOLN
22.0000 [IU] | Freq: Two times a day (BID) | SUBCUTANEOUS | Status: DC
  Administered 2020-04-14 (×2): 22 [IU] via SUBCUTANEOUS
  Filled 2020-04-14 (×4): qty 0.22

## 2020-04-14 MED ORDER — PIVOT 1.5 CAL PO LIQD
1000.0000 mL | ORAL | Status: DC
  Administered 2020-04-15 – 2020-04-16 (×2): 1000 mL

## 2020-04-14 MED ORDER — POTASSIUM CHLORIDE 20 MEQ PO PACK
40.0000 meq | PACK | Freq: Four times a day (QID) | ORAL | Status: AC
  Administered 2020-04-14: 40 meq
  Filled 2020-04-14: qty 2

## 2020-04-14 MED ORDER — INSULIN ASPART 100 UNIT/ML ~~LOC~~ SOLN
4.0000 [IU] | SUBCUTANEOUS | Status: DC
  Administered 2020-04-14 – 2020-04-19 (×31): 4 [IU] via SUBCUTANEOUS

## 2020-04-14 NOTE — Progress Notes (Signed)
Pt supined with help of RT x 2 and RN x 4 without any complications. Cloth tape removed , no breakdown noted on the skin. ETT tube secured with commercial tube holder at 26 cm lips. Pt stayed stable and her O2 saturation is 100. RT will continue to monitor.

## 2020-04-14 NOTE — Progress Notes (Signed)
Pts head turned to left side without any complications. ETT tube is secured with cloth tape.

## 2020-04-14 NOTE — Progress Notes (Signed)
PT Cancellation Note  Patient Details Name: Sandra [name] MRN: [id number] DOB: [birth year]   Cancelled Treatment:    Reason Eval/Treat Not Completed: Medical issues which prohibited therapy.  Pt transferred from [facility] where she was active with PT on BiPAP.  Unfortunately, she is now intubated.  FiO2 100% and PEEP 12 are outside of parameters for therapy.  PT to hold and check in on Monday 04/16/20.  Thanks,  Corinna Capra, PT, DPT  Acute Rehabilitation [phone] pager #[phone] office       [name] Register 04/14/2020, 8:24 AM

## 2020-04-14 NOTE — Progress Notes (Signed)
Patient's arms rotated and head turned by RN x 1 and RT x 1 without complications. 

## 2020-04-14 NOTE — Progress Notes (Signed)
   NAME:  Sandra [name], MRN:  [id number], DOB:  [birth year], LOS: 15 ADMISSION DATE:  04/15/2020,   Brief History:  This is a 45 year old female with COVID-19 pneumonia and ARDS  History of Present Illness:  Is a 45 year old unvaccinated white female who presented to the hospital on 04/10/2020 with Covid pneumonia.  Over the next 14 days she has developed Covid ARDS.  She progressed from high flow nasal cannula to BiPAP and then ultimately was intubated on 04/12/2020.  She has been treated with remdesivir steroids and baricitnib.  On 02/14 patient had a small segmental right lower lobe pulmonary embolism and was converted to full dose Lovenox.   Past Medical History:  Anxiety Arthritis Depression Fibromyalgia GERD Gastric ulceration Hiatal hernia POTS Hypertension Morbid obesity  Significant Hospital Events:  Intubation  Consults:  Critical care medicine  Procedures:  04/13/20 intubated 04/13/2020 triple-lumen catheter placed  Significant Diagnostic Tests:    Micro Data:  Covid + 03/28/2020  Antimicrobials:  Levaquin    Objective   Blood pressure (!) 158/67, pulse (!) 58, temperature 99.7 F (37.6 C), temperature source Axillary, resp. rate (!) 28, height 5\' 4"  (1.626 m), weight 120.5 kg, SpO2 96 %.    Vent Mode: PRVC FiO2 (%):  [100 %] 100 % Set Rate:  [24 bmp-28 bmp] 28 bmp Vt Set:  [490 mL] 490 mL PEEP:  [12 cmH20-15 cmH20] 12 cmH20 Plateau Pressure:  [29 cmH20-35 cmH20] 35 cmH20   Intake/Output Summary (Last 24 hours) at 04/14/2020 0801 Last data filed at 04/14/2020 0500 Gross per 24 hour  Intake 769.85 ml  Output 3050 ml  Net -2280.15 ml   Filed Weights   04/11/20 0357 04/13/20 2230 04/14/20 0600  Weight: 127.9 kg 120.5 kg 120.5 kg    Examination: Constitutional: proned woman in NAD  Eyes: pinpoint, equal Ears, nose, mouth, and throat: ETT in place, minimal secretions Cardiovascular: proned, ext warm Respiratory: suprisingly clear, passive on  vent Gastrointestinal: proned Skin: No rashes, normal turgor Neurologic: heavily sedated precluding eval Psychiatric: cannot assess  Temps okay Net neg No chemistries in days  Assessment & Plan:  Acute respiratory failure Covid ARDS Covid pneumonia Morbid obesity Idiopathic intracranial hypertension?? POTS Chronic pain COVID- associated pulmonary embolsim Enterococcal UTI Hyperglycemia  - ARDSnet PEEP/FiO2, attempt to limit driving pressures as allowed by lung compliance - VAP prevention bundle -16/8h proning with A/a gradients < 150, adjust based on clinical response - Sedation to minimize shearing forces created by ventilator assynchrony - Keep even as able with balanced diuresis as tolerated by renal function - Levaquin for UTI with end date in place - Continue IV steroids for now - Strengthen basal bolus insulin regimen - DC all sedating meds except fent/prop/versed, may have to stop prop if TG keeps rising  Best practice (evaluated daily)  Diet: TF pain/Anxiety/Delirium protocol (if indicated): prop/fent/versed VAP protocol (if indicated): Initiated DVT prophylaxis: Full dose Lovenox GI prophylaxis: Protonix Glucose control: Insulin sliding Mobility: Bedrest Disposition: Intensive care unit  Goals of Care:   Code Status: Full Will address further pending clinical course   Patient critically ill due to COVID ARDS Interventions to address this today vent titration Risk of deterioration without these interventions is high  I personally spent 35 minutes providing critical care not including any separately billable procedures  01-31-1987 MD [place] 03/26/2020 7:23 AM Prefer epic messenger for cross cover needs If after hours, please call E-link

## 2020-04-14 NOTE — Progress Notes (Signed)
Called and updated daughter.  Myrla Halsted MD PCCM

## 2020-04-14 NOTE — Progress Notes (Signed)
Nutrition Follow-up  RD working remotely.  DOCUMENTATION CODES:   Morbid obesity  INTERVENTION:  - will adjust TF regimen: Pivot 1.5 @ 20 ml/hr to advance by 10 ml every 8 hours to reach goal rate of 50 ml/hr with 90 ml Prosource TF BID.  - at goal rate, this regimen, without kcal from propofol will provide 1960 kcal, 156 grams protein, and 911 ml free water.  - will monitor tolerance at goal rate and make decisions at that time about ability to continue increasing rate; partly dependent on plans for proning.  - free water flush, if desired, to be per CCM.  NUTRITION DIAGNOSIS:   Increased nutrient needs related to catabolic illness (QIONG-29 infection) as evidenced by estimated needs. -ongoing  GOAL:   Provide needs based on ASPEN/SCCM guidelines -to be met with TF  MONITOR:   Vent status,TF tolerance,Labs,Weight trends  REASON FOR ASSESSMENT:   Consult Enteral/tube feeding initiation and management  ASSESSMENT:   45 year old female admitted with acute respiratory failure with hypoxia secondary to COVID-19 pneumonia. Past medical history of GERD, vertigo, depression, anxiety, pseudomotor cerebri, benign intracranial hypertension with VP shunt presented with worsening SOB, cough, fatigue, and body aches since testing positive for Covid 7 days ago with home kit.   Patient was admitted to [facility] on 2/5 and transferred yesterday at 2207. She was intubated yesterday at ~1538. No OGT/NGT documented in the [facility] avatar or flow sheet.  Able to communicate with RN via secure chat and she reports that patient's OGT is clogged and needs to be replaced.   Will adjust TF regimen as outlined above. Weight has been fairly stable throughout admission with a few outliers. Mild pitting edema to BLE documented in the edema section of the flow sheet.   Per notes: - ARDS 2/2 COVID-19 - proning x16 hours/day - possible need to stop propofol if TGs continue rising   Patient is currently  intubated on ventilator support MV: 13.6 L/min Temp (24hrs), Avg:98.8 F (37.1 C), Min:98.6 F (37 C), Max:99.7 F (37.6 C) Propofol: 36.2 ml/hr (956 kcal)  Labs reviewed; CBGs: 231, 219, 217 mg/dl, BUN: 22 mg/dl, Ca: 8.8 mg/dl, LFTs elevated, triglycerides: 328 mg/dl.    Medications reviewed; 25 mcg cholecalciferol/day, 100 mg colace BID, 20 mg IV lasix/day, sliding scale novolog, 4 units novolog every 4 hours, 22 units levemir BID, 60 mg solu-medrol BID, 5 ml mycostatin QID, 40 mg IV protonix/day, 17 g miralax/day, 40 mEq Klor-Con x2 doses 2/19, 220 mg zinc sulfate/day.   Drips; fentanyl @ 400 mcg/hr, versed @ 10 mg/hr, levo @ 4 mcg/min, propofol @ 50 mcg/kg/min.     NUTRITION - FOCUSED PHYSICAL EXAM:  unable to complete at this time.   Diet Order:   Diet Order            Diet NPO time specified  Diet effective now                 EDUCATION NEEDS:   Not appropriate for education at this time  Skin:  Skin Assessment: Reviewed RN Assessment  Last BM:  2/13  Height:   Ht Readings from Last 1 Encounters:  04/13/20 $RemoveB'5\' 4"'YKfhMpas$  (1.626 m)    Weight:   Wt Readings from Last 1 Encounters:  04/14/20 120.5 kg    Estimated Nutritional Needs:  Kcal:  2430 kcal (30 kcal/kg adjBW) Protein:  150-165 grams Fluid:  >/= 2.5 L/day      Jarome Matin, MS, RD, LDN, CNSC Inpatient  Clinical Dietitian RD pager # available in [place]  After hours/weekend pager # available in [facility]

## 2020-04-14 NOTE — Progress Notes (Signed)
eLink Physician-Brief Progress Note Patient Name: Sandra [name] DOB: 05/26/1975 MRN: [id number]   Date of Service  04/14/2020  HPI/Events of Note    eICU Interventions  KUB reviewed and NG tube tip confirmed to be in the stomach.        Thomasene Lot Sharbel Sahagun 04/14/2020, 2:45 AM

## 2020-04-14 NOTE — Progress Notes (Signed)
Removed tube holder, mepilex tape placed on the cheeks and upper lip. ETT tube secured 26 cm  with cloth tape on the left side. Pt is resting well  And her O2 saturation is 91. Pts head is turned to the right side.

## 2020-04-14 NOTE — Progress Notes (Signed)
CMP collected this AM at 0800 on 04/14/20 but not resulting in Epic. Results attached and can also be found under media tab.

## 2020-04-14 NOTE — Progress Notes (Signed)
Patient's arms rotated and head turned by RN x 1 and RT x 1 without complications.

## 2020-04-15 DIAGNOSIS — U071 COVID-19: Secondary | ICD-10-CM | POA: Diagnosis not present

## 2020-04-15 DIAGNOSIS — J1282 Pneumonia due to coronavirus disease 2019: Secondary | ICD-10-CM | POA: Diagnosis not present

## 2020-04-15 LAB — GLUCOSE, CAPILLARY
Glucose-Capillary: 166 mg/dL — ABNORMAL HIGH (ref 70–99)
Glucose-Capillary: 175 mg/dL — ABNORMAL HIGH (ref 70–99)
Glucose-Capillary: 175 mg/dL — ABNORMAL HIGH (ref 70–99)
Glucose-Capillary: 153 mg/dL — ABNORMAL HIGH (ref 70–99)
Glucose-Capillary: 184 mg/dL — ABNORMAL HIGH (ref 70–99)
Glucose-Capillary: 220 mg/dL — ABNORMAL HIGH (ref 70–99)
Glucose-Capillary: 159 mg/dL — ABNORMAL HIGH (ref 70–99)
Glucose-Capillary: 162 mg/dL — ABNORMAL HIGH (ref 70–99)

## 2020-04-15 LAB — COMPREHENSIVE METABOLIC PANEL
ALT: 128 U/L — ABNORMAL HIGH (ref 0–44)
AST: 36 U/L (ref 15–41)
Albumin: 2.2 g/dL — ABNORMAL LOW (ref 3.5–5.0)
Alkaline Phosphatase: 111 U/L (ref 38–126)
Anion gap: 12 (ref 5–15)
BUN: 16 mg/dL (ref 6–20)
CO2: 26 mmol/L (ref 22–32)
Calcium: 8.3 mg/dL — ABNORMAL LOW (ref 8.9–10.3)
Chloride: 107 mmol/L (ref 98–111)
Creatinine, Ser: 0.72 mg/dL (ref 0.44–1.00)
GFR, Estimated: >60 mL/min (ref >60)
Glucose, Bld: 201 mg/dL — ABNORMAL HIGH (ref 70–99)
Potassium: 3.6 mmol/L (ref 3.5–5.1)
Sodium: 145 mmol/L (ref 135–145)
Total Bilirubin: 0.5 mg/dL (ref 0.3–1.2)
Total Protein: 5.1 g/dL — ABNORMAL LOW (ref 6.5–8.1)

## 2020-04-15 LAB — POCT I-STAT 7, (LYTES, BLD GAS, ICA,H+H)
Acid-Base Excess: 7 mmol/L — ABNORMAL HIGH (ref 0.0–2.0)
Acid-Base Excess: 7 mmol/L — ABNORMAL HIGH (ref 0.0–2.0)
Bicarbonate: 31.4 mmol/L — ABNORMAL HIGH (ref 20.0–28.0)
Bicarbonate: 32 mmol/L — ABNORMAL HIGH (ref 20.0–28.0)
Calcium, Ion: 1.22 mmol/L (ref 1.15–1.40)
Calcium, Ion: 1.23 mmol/L (ref 1.15–1.40)
HCT: 28 % — ABNORMAL LOW (ref 36.0–46.0)
HCT: 28 % — ABNORMAL LOW (ref 36.0–46.0)
Hemoglobin: 9.5 g/dL — ABNORMAL LOW (ref 12.0–15.0)
Hemoglobin: 9.5 g/dL — ABNORMAL LOW (ref 12.0–15.0)
O2 Saturation: 100 %
O2 Saturation: 100 %
Patient temperature: 98.5
Patient temperature: 99.3
Potassium: 3.7 mmol/L (ref 3.5–5.1)
Potassium: 3.7 mmol/L (ref 3.5–5.1)
Sodium: 146 mmol/L — ABNORMAL HIGH (ref 135–145)
Sodium: 145 mmol/L (ref 135–145)
TCO2: 33 mmol/L — ABNORMAL HIGH (ref 22–32)
TCO2: 33 mmol/L — ABNORMAL HIGH (ref 22–32)
pCO2 arterial: 42.9 mmHg (ref 32.0–48.0)
pCO2 arterial: 49.7 mmHg — ABNORMAL HIGH (ref 32.0–48.0)
pH, Arterial: 7.473 — ABNORMAL HIGH (ref 7.350–7.450)
pH, Arterial: 7.419 (ref 7.350–7.450)
pO2, Arterial: 188 mmHg — ABNORMAL HIGH (ref 83.0–108.0)
pO2, Arterial: 183 mmHg — ABNORMAL HIGH (ref 83.0–108.0)

## 2020-04-15 LAB — CULTURE, BLOOD (ROUTINE X 2)
Culture: NO GROWTH
Culture: NO GROWTH
Special Requests: ADEQUATE

## 2020-04-15 LAB — CBC
HCT: 33.3 % — ABNORMAL LOW (ref 36.0–46.0)
Hemoglobin: 10 g/dL — ABNORMAL LOW (ref 12.0–15.0)
MCH: 28.3 pg (ref 26.0–34.0)
MCHC: 30 g/dL (ref 30.0–36.0)
MCV: 94.3 fL (ref 80.0–100.0)
Platelets: 183 10*3/uL (ref 150–400)
RBC: 3.53 MIL/uL — ABNORMAL LOW (ref 3.87–5.11)
RDW: 15.5 % (ref 11.5–15.5)
WBC: 9 10*3/uL (ref 4.0–10.5)
nRBC: 0 % (ref 0.0–0.2)

## 2020-04-15 LAB — PHOSPHORUS: Phosphorus: 2.8 mg/dL (ref 2.5–4.6)

## 2020-04-15 LAB — TRIGLYCERIDES: Triglycerides: 349 mg/dL — ABNORMAL HIGH (ref <150)

## 2020-04-15 LAB — MAGNESIUM: Magnesium: 2.1 mg/dL (ref 1.7–2.4)

## 2020-04-15 MED ORDER — POTASSIUM CHLORIDE 20 MEQ PO PACK
40.0000 meq | PACK | Freq: Four times a day (QID) | ORAL | Status: AC
  Administered 2020-04-15 (×2): 40 meq
  Filled 2020-04-15 (×2): qty 2

## 2020-04-15 MED ORDER — FUROSEMIDE 10 MG/ML IJ SOLN
20.0000 mg | Freq: Two times a day (BID) | INTRAMUSCULAR | Status: DC
  Administered 2020-04-15 – 2020-04-16 (×3): 20 mg via INTRAVENOUS
  Filled 2020-04-15 (×3): qty 2

## 2020-04-15 MED ORDER — METHYLPREDNISOLONE SODIUM SUCC 125 MG IJ SOLR
60.0000 mg | INTRAMUSCULAR | Status: AC
  Administered 2020-04-15 – 2020-04-17 (×3): 60 mg via INTRAVENOUS
  Filled 2020-04-15 (×3): qty 2

## 2020-04-15 MED ORDER — METHYLNALTREXONE BROMIDE 12 MG/0.6ML ~~LOC~~ SOLN
12.0000 mg | Freq: Once | SUBCUTANEOUS | Status: AC
  Administered 2020-04-15: 12 mg via SUBCUTANEOUS
  Filled 2020-04-15: qty 0.6

## 2020-04-15 MED ORDER — POTASSIUM CHLORIDE 20 MEQ PO PACK: 40.0000 meq | PACK | Freq: Four times a day (QID) | ORAL | Status: DC

## 2020-04-15 MED ORDER — SORBITOL 70 % SOLN
60.0000 mL | Freq: Every day | Status: DC
  Administered 2020-04-15 – 2020-04-25 (×10): 60 mL
  Filled 2020-04-15 (×8): qty 60

## 2020-04-15 MED ORDER — METHYLPREDNISOLONE SODIUM SUCC 40 MG IJ SOLR
40.0000 mg | INTRAMUSCULAR | Status: AC
  Administered 2020-04-18 – 2020-04-20 (×3): 40 mg via INTRAVENOUS
  Filled 2020-04-15 (×3): qty 1

## 2020-04-15 MED ORDER — INSULIN DETEMIR 100 UNIT/ML ~~LOC~~ SOLN
22.0000 [IU] | Freq: Two times a day (BID) | SUBCUTANEOUS | Status: DC
  Administered 2020-04-15 – 2020-04-17 (×5): 22 [IU] via SUBCUTANEOUS
  Filled 2020-04-15 (×6): qty 0.22

## 2020-04-15 MED ORDER — INSULIN DETEMIR 100 UNIT/ML ~~LOC~~ SOLN
30.0000 [IU] | Freq: Two times a day (BID) | SUBCUTANEOUS | Status: DC
  Filled 2020-04-15 (×2): qty 0.3

## 2020-04-15 MED ORDER — FREE WATER
200.0000 mL | Freq: Four times a day (QID) | Status: DC
  Administered 2020-04-15 – 2020-04-18 (×12): 200 mL

## 2020-04-15 MED ORDER — METHYLPREDNISOLONE SODIUM SUCC 40 MG IJ SOLR
20.0000 mg | INTRAMUSCULAR | Status: AC
  Administered 2020-04-21 – 2020-04-23 (×3): 20 mg via INTRAVENOUS
  Filled 2020-04-15 (×3): qty 1

## 2020-04-15 NOTE — Progress Notes (Signed)
Assisted family with tele-visit via elink 

## 2020-04-15 NOTE — Progress Notes (Signed)
NAME:  Sandra [name], MRN:  [id number], DOB:  08/10/1975, LOS: 16 ADMISSION DATE:  04/19/2020,   Brief History:  This is a 45 year old female with COVID-19 pneumonia and ARDS  History of Present Illness:  Is a 45 year old unvaccinated white female who presented to the hospital on 03/27/2020 with Covid pneumonia.  Over the next 14 days she has developed Covid ARDS.  She progressed from high flow nasal cannula to BiPAP and then ultimately was intubated on 04/12/2020.  She has been treated with remdesivir steroids and baricitnib.  On 02/14 patient had a small segmental right lower lobe pulmonary embolism and was converted to full dose Lovenox.   Past Medical History:  Anxiety Arthritis Depression Fibromyalgia GERD Gastric ulceration Hiatal hernia POTS Hypertension Morbid obesity  Significant Hospital Events:  Intubation  Consults:  Critical care medicine  Procedures:  04/13/20 intubated 04/13/2020 triple-lumen catheter placed  Significant Diagnostic Tests:    Micro Data:  Covid + 04/23/2020  Antimicrobials:  Levaquin    Subjective  Looks better today from gas exchange standpoint, still heavily sedated.  Objective   Blood pressure (!) 91/43, pulse 62, temperature 99.3 F (37.4 C), temperature source Axillary, resp. rate (!) 23, height 5\' 4"  (1.626 m), weight 122.7 kg, SpO2 95 %.    Vent Mode: PRVC FiO2 (%):  [70 %-100 %] 70 % Set Rate:  [23 bmp-28 bmp] 23 bmp Vt Set:  [490 mL] 490 mL PEEP:  [12 cmH20] 12 cmH20 Plateau Pressure:  [30 cmH20-35 cmH20] 30 cmH20   Intake/Output Summary (Last 24 hours) at 04/15/2020 04/17/2020 Last data filed at 04/15/2020 0450 Gross per 24 hour  Intake 1017.37 ml  Output 3070 ml  Net -2052.63 ml   Filed Weights   04/14/20 0600 04/15/20 0543 04/15/20 0600  Weight: 120.5 kg 116.5 kg 122.7 kg    Examination: Constitutional: no acute distress laying in bed supine  Eyes: slightly disconjugate gaze, pupils equal/reactive Ears, nose,  mouth, and throat: ETT in place, minimal secretions Cardiovascular: RRR, ext warm Respiratory: Suprisingly clear, passive on vent Gastrointestinal: soft, hypoactive BS Skin: No rashes, normal turgor Neurologic: heavily sedated Psychiatric: RASS -5  Temps stable Bps good Cr stable Sodium a bit up  Assessment & Plan:  Acute respiratory failure Covid ARDS Suspected pulmonary edema superimposed Covid pneumonia Morbid obesity Idiopathic intracranial hypertension s/p VP shunt POTS Chronic pain, fibromyalgia COVID- associated pulmonary embolsim Enterococcal UTI Hyperglycemia  - ARDSnet PEEP/FiO2, attempt to limit driving pressures as allowed by lung compliance - VAP prevention bundle -16/8h proning with A/a gradients < 150, adjust based on clinical response - Sedation to minimize shearing forces created by ventilator assynchrony - Keep negative as able with balanced diuresis as tolerated by renal function: lasix 20mg  BID - Levaquin for UTI with end date in place - Decrease IV steroids: 9 day taper ordered - Continue current insulin regimen given decrease in steroids - Watch TG, may need to switch to fent/versed alone  Best practice (evaluated daily)  Diet: TF pain/Anxiety/Delirium protocol (if indicated): prop/fent/versed VAP protocol (if indicated): Initiated DVT prophylaxis: Full dose Lovenox GI prophylaxis: Protonix Glucose control: Insulin sliding Mobility: Bedrest Disposition: Intensive care unit  Goals of Care:   Code Status: Full Confirmed on 04/14/20 Will call and update family  Patient critically ill due to COVID ARDS Interventions to address this today vent titration Risk of deterioration without these interventions is high  I personally spent 34 minutes providing critical care not including any separately billable procedures  MD  [facility] Prefer epic messenger for cross cover needs If after hours, please call  E-link

## 2020-04-15 NOTE — Progress Notes (Signed)
eLink Physician-Brief Progress Note Patient Name: Sandra [name] DOB: [birth year] MRN: [id number]   Date of Service  04/15/2020  HPI/Events of Note  ABG on 100%/PRVC 28/TV 490/P 12 = 7.479/44.7/188.  eICU Interventions  Plan: 1. Decrease PRVC rate to 23. 2. Repeat ABG at 5 SM.     Intervention Category Major Interventions: Respiratory failure - evaluation and management;Acid-Base disturbance - evaluation and management  Raynald Rouillard Eugene 04/15/2020, 12:11 AM

## 2020-04-15 NOTE — Progress Notes (Signed)
Neuro: Pt remains sedated and unable to follow commands at this time. Pupils equal and reactive.    Respiratory: Pt remains on ventilator at this time with vent settings of PRVC 60%/12/23/490. O2 sats >90%. Last blood gas results below.  Results for MELLONIE, GUESS (MRN [id number]) as of 04/15/2020 18:02  Ref. Range 04/15/2020 04:57  Sample type Unknown ARTERIAL  pH, Arterial Latest Ref Range: 7.350 - 7.450  7.419  pCO2 arterial Latest Ref Range: 32.0 - 48.0 mmHg 49.7 (H)  pO2, Arterial Latest Ref Range: 83.0 - 108.0 mmHg 183 (H)  TCO2 Latest Ref Range: 22 - 32 mmol/L 33 (H)  Acid-Base Excess Latest Ref Range: 0.0 - 2.0 mmol/L 7.0 (H)  Bicarbonate Latest Ref Range: 20.0 - 28.0 mmol/L 32.0 (H)  O2 Saturation Latest Units: % 100.0  Patient temperature Unknown 99.3 F  Collection site Unknown Radial     Cardiovascular: Pt remains in sinus rhythm and brady down to 40's at times. BP continues to be low and pt receiving vasopressors for BP control. Pt with generalized edema.   GI/GU: Pt with foley in place with adequate urine output. Pt with 1050 ml of urine output throughout shift. Last BM is recorded on 02/05. Bowel regimen ongoing. Pt receiving tube feeds via a NG tube and tolerating at this time with minimal residuals.   Skin: Skin intact with no s/s of skin breakdown at this time. 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   Pain: Pt with no signs of pain, infusion of fentanyl continued.    Events: NO acute events throughout shift. Pts plan of care to continue with current regimen, Family updated and no further questions at this time.

## 2020-04-15 NOTE — Plan of Care (Signed)

## 2020-04-16 ENCOUNTER — Inpatient Hospital Stay (HOSPITAL_COMMUNITY): Payer: Medicare Other

## 2020-04-16 DIAGNOSIS — U071 COVID-19: Secondary | ICD-10-CM | POA: Diagnosis not present

## 2020-04-16 DIAGNOSIS — J1282 Pneumonia due to coronavirus disease 2019: Secondary | ICD-10-CM | POA: Diagnosis not present

## 2020-04-16 DIAGNOSIS — J069 Acute upper respiratory infection, unspecified: Secondary | ICD-10-CM | POA: Diagnosis not present

## 2020-04-16 LAB — COMPREHENSIVE METABOLIC PANEL
ALT: 106 U/L — ABNORMAL HIGH (ref 0–44)
AST: 30 U/L (ref 15–41)
Albumin: 2.3 g/dL — ABNORMAL LOW (ref 3.5–5.0)
Alkaline Phosphatase: 109 U/L (ref 38–126)
Anion gap: 8 (ref 5–15)
BUN: 21 mg/dL — ABNORMAL HIGH (ref 6–20)
CO2: 31 mmol/L (ref 22–32)
Calcium: 8.6 mg/dL — ABNORMAL LOW (ref 8.9–10.3)
Chloride: 107 mmol/L (ref 98–111)
Creatinine, Ser: 0.92 mg/dL (ref 0.44–1.00)
GFR, Estimated: >60 mL/min (ref >60)
Glucose, Bld: 169 mg/dL — ABNORMAL HIGH (ref 70–99)
Potassium: 3.4 mmol/L — ABNORMAL LOW (ref 3.5–5.1)
Sodium: 146 mmol/L — ABNORMAL HIGH (ref 135–145)
Total Bilirubin: 0.6 mg/dL (ref 0.3–1.2)
Total Protein: 5.8 g/dL — ABNORMAL LOW (ref 6.5–8.1)

## 2020-04-16 LAB — CBC
HCT: 35 % — ABNORMAL LOW (ref 36.0–46.0)
Hemoglobin: 10.5 g/dL — ABNORMAL LOW (ref 12.0–15.0)
MCH: 28.6 pg (ref 26.0–34.0)
MCHC: 30 g/dL (ref 30.0–36.0)
MCV: 95.4 fL (ref 80.0–100.0)
Platelets: 230 10*3/uL (ref 150–400)
RBC: 3.67 MIL/uL — ABNORMAL LOW (ref 3.87–5.11)
RDW: 16.1 % — ABNORMAL HIGH (ref 11.5–15.5)
WBC: 13.4 10*3/uL — ABNORMAL HIGH (ref 4.0–10.5)
nRBC: 0 % (ref 0.0–0.2)

## 2020-04-16 LAB — GLUCOSE, CAPILLARY
Glucose-Capillary: 161 mg/dL — ABNORMAL HIGH (ref 70–99)
Glucose-Capillary: 172 mg/dL — ABNORMAL HIGH (ref 70–99)
Glucose-Capillary: 178 mg/dL — ABNORMAL HIGH (ref 70–99)
Glucose-Capillary: 243 mg/dL — ABNORMAL HIGH (ref 70–99)
Glucose-Capillary: 257 mg/dL — ABNORMAL HIGH (ref 70–99)
Glucose-Capillary: 287 mg/dL — ABNORMAL HIGH (ref 70–99)

## 2020-04-16 LAB — MAGNESIUM: Magnesium: 2.1 mg/dL (ref 1.7–2.4)

## 2020-04-16 LAB — PHOSPHORUS: Phosphorus: 3.7 mg/dL (ref 2.5–4.6)

## 2020-04-16 MED ORDER — BISACODYL 10 MG RE SUPP
10.0000 mg | Freq: Two times a day (BID) | RECTAL | Status: DC
  Administered 2020-04-16 – 2020-04-20 (×5): 10 mg via RECTAL
  Filled 2020-04-16 (×5): qty 1

## 2020-04-16 MED ORDER — MIDAZOLAM 50MG/50ML (1MG/ML) PREMIX INFUSION
2.0000 mg/h | INTRAVENOUS | Status: DC
  Administered 2020-04-16 (×4): 12 mg/h via INTRAVENOUS
  Administered 2020-04-17: 14 mg/h via INTRAVENOUS
  Administered 2020-04-17 (×2): 13 mg/h via INTRAVENOUS
  Filled 2020-04-16 (×8): qty 50

## 2020-04-16 MED ORDER — ARTIFICIAL TEARS OPHTHALMIC OINT
1.0000 "application " | TOPICAL_OINTMENT | Freq: Three times a day (TID) | OPHTHALMIC | Status: DC
  Administered 2020-04-16 – 2020-04-27 (×34): 1 via OPHTHALMIC
  Filled 2020-04-16 (×6): qty 3.5

## 2020-04-16 MED ORDER — FENTANYL 2500MCG IN NS 250ML (10MCG/ML) PREMIX INFUSION
50.0000 ug/h | INTRAVENOUS | Status: DC
  Administered 2020-04-16 – 2020-04-21 (×14): 300 ug/h via INTRAVENOUS
  Filled 2020-04-16 (×13): qty 250

## 2020-04-16 MED ORDER — POTASSIUM CHLORIDE CRYS ER 20 MEQ PO TBCR
40.0000 meq | EXTENDED_RELEASE_TABLET | Freq: Two times a day (BID) | ORAL | Status: AC
  Administered 2020-04-16 (×2): 40 meq via ORAL
  Filled 2020-04-16 (×2): qty 2

## 2020-04-16 MED ORDER — MIDAZOLAM BOLUS VIA INFUSION
1.0000 mg | INTRAVENOUS | Status: DC | PRN
  Administered 2020-04-21 – 2020-04-24 (×16): 2 mg via INTRAVENOUS
  Administered 2020-04-26 (×2): 1 mg via INTRAVENOUS
  Filled 2020-04-16: qty 2

## 2020-04-16 MED ORDER — ALBUMIN HUMAN 25 % IV SOLN
25.0000 g | Freq: Four times a day (QID) | INTRAVENOUS | Status: AC
  Administered 2020-04-16: 12.5 g via INTRAVENOUS
  Administered 2020-04-16 – 2020-04-17 (×3): 25 g via INTRAVENOUS
  Filled 2020-04-16 (×4): qty 100

## 2020-04-16 MED ORDER — FENTANYL CITRATE (PF) 100 MCG/2ML IJ SOLN
50.0000 ug | Freq: Once | INTRAMUSCULAR | Status: AC
  Administered 2020-04-16: 50 ug via INTRAVENOUS

## 2020-04-16 MED ORDER — FENTANYL BOLUS VIA INFUSION
50.0000 ug | INTRAVENOUS | Status: DC | PRN
  Filled 2020-04-16: qty 50

## 2020-04-16 MED ORDER — SODIUM CHLORIDE 0.9 % IV SOLN
0.0000 ug/kg/min | INTRAVENOUS | Status: DC
  Administered 2020-04-16 (×2): 3 ug/kg/min via INTRAVENOUS
  Administered 2020-04-17: 4 ug/kg/min via INTRAVENOUS
  Administered 2020-04-17: 3 ug/kg/min via INTRAVENOUS
  Administered 2020-04-17: 4 ug/kg/min via INTRAVENOUS
  Administered 2020-04-18 – 2020-04-19 (×4): 3 ug/kg/min via INTRAVENOUS
  Filled 2020-04-16 (×14): qty 20

## 2020-04-16 MED ORDER — ACETAMINOPHEN 160 MG/5ML PO SOLN
650.0000 mg | Freq: Four times a day (QID) | ORAL | Status: DC | PRN
  Administered 2020-04-16 – 2020-04-26 (×13): 650 mg
  Filled 2020-04-16 (×13): qty 20.3

## 2020-04-16 MED ORDER — ROCURONIUM BROMIDE 10 MG/ML (PF) SYRINGE
50.0000 mg | PREFILLED_SYRINGE | Freq: Once | INTRAVENOUS | Status: AC
  Administered 2020-04-16: 50 mg via INTRAVENOUS
  Filled 2020-04-16: qty 10

## 2020-04-16 MED ORDER — ACETAZOLAMIDE 250 MG PO TABS
250.0000 mg | ORAL_TABLET | Freq: Every day | ORAL | Status: DC
  Administered 2020-04-16 – 2020-04-18 (×3): 250 mg via ORAL
  Filled 2020-04-16 (×4): qty 1

## 2020-04-16 MED ORDER — ACETAMINOPHEN 160 MG/5ML PO SOLN: 650.0000 mg | Freq: Four times a day (QID) | ORAL | Status: DC | PRN

## 2020-04-16 NOTE — Progress Notes (Signed)
PT Cancellation Note  Patient Details Name: Sandra [name] MRN: [id number] DOB: [birth year]   Cancelled Treatment:    Reason Eval/Treat Not Completed: Medical issues which prohibited therapy (Per nurse, pt paralyzed.  If she isnt she fights the vent. Will sign off. MD, please reorder when pt becomes appropriate.)   Berline Lopes 04/16/2020, 3:11 PM Kayron Hicklin W,PT Acute Rehabilitation Services Pager:  [phone]  Office:  [phone]

## 2020-04-16 NOTE — Progress Notes (Addendum)
NAME:  Sandra [name], MRN:  [id number], DOB:  11-Feb-1976, LOS: 17 ADMISSION DATE:  2020-04-17,   Brief History:  This is a 45 year old female with COVID-19 pneumonia and ARDS  History of Present Illness:  Is a 45 year old unvaccinated white female who presented to the hospital on 04-17-20 with Covid pneumonia.  Over the next 14 days she has developed Covid ARDS.  She progressed from high flow nasal cannula to BiPAP and then ultimately was intubated on 04/12/2020.  She has been treated with remdesivir steroids and baricitnib.  On 02/14 patient had a small segmental right lower lobe pulmonary embolism and was converted to full dose Lovenox.   Past Medical History:  Anxiety Arthritis Depression Fibromyalgia GERD Gastric ulceration Hiatal hernia POTS Hypertension Morbid obesity  Significant Hospital Events:  Intubation  Consults:  Critical care medicine  Procedures:  04/13/20 intubated 04/13/2020 triple-lumen catheter placed  Significant Diagnostic Tests:    Micro Data:  Covid + 04/17/20  Antimicrobials:  Levaquin    Subjective  No events. Some vent dyssynchrony requiring paralytics last night.  Objective   Blood pressure (!) 109/52, pulse 74, temperature 100 F (37.8 C), temperature source Oral, resp. rate 16, height 5\' 4"  (1.626 m), weight 117.6 kg, SpO2 94 %.    Vent Mode: PRVC FiO2 (%):  [60 %-100 %] 100 % Set Rate:  [23 bmp] 23 bmp Vt Set:  [490 mL] 490 mL PEEP:  [10 cmH20-14 cmH20] 14 cmH20 Plateau Pressure:  [28 cmH20-33 cmH20] 28 cmH20   Intake/Output Summary (Last 24 hours) at 04/16/2020 04/18/2020 Last data filed at 04/16/2020 0600 Gross per 24 hour  Intake 2595.55 ml  Output 2010 ml  Net 585.55 ml   Filed Weights   04/15/20 0543 04/15/20 0600 04/16/20 0400  Weight: 116.5 kg 122.7 kg 117.6 kg    Examination: Constitutional: ill appearing sedated woman supine, sedated  Eyes: pupils pinpoint, equal,  reactive Ears, nose, mouth, and throat: ETT  in place, minimal secretions Cardiovascular: RRR, ext warm Respiratory: remains pretty clear, triggering vent Gastrointestinal: soft, hypoactive BS Skin: No rashes, normal turgor Neurologic: not withdrawing on current level of sedation Psychiatric: RASS -5  Labs reviewed No new chest imaging   Assessment & Plan:  Acute respiratory failure Covid ARDS Suspected pulmonary edema superimposed Covid pneumonia Morbid obesity Idiopathic intracranial hypertension s/p VP shunt POTS Chronic pain, fibromyalgia COVID- associated pulmonary embolsim Enterococcal UTI Hyperglycemia Constipation  - ARDSnet PEEP/FiO2, attempt to limit driving pressures as allowed by lung compliance - VAP prevention bundle -16/8h proning with A/a gradients < 150, adjust based on clinical response: will need to prone today - Sedation to minimize shearing forces created by ventilator assynchrony, NMB protocol to start 2/21 - I think she is now near euvolemia, hold lasix, give some albumin, diamox - Levaquin for UTI with end date in place - Decrease IV steroids: 9 day taper ordered - Continue current insulin regimen given decrease in steroids: seems okay today - Stop prop rising TG; already on versed/fent - Sorbitol/miralax/dulcolax and qod relistor until we have BM, may need enema  Best practice (evaluated daily)  Diet: TF pain/Anxiety/Delirium protocol (if indicated): fent/versed VAP protocol (if indicated): Initiated DVT prophylaxis: Full dose Lovenox GI prophylaxis: Protonix Glucose control: Insulin sliding Mobility: Bedrest Disposition: Intensive care unit  Goals of Care:   Code Status: Full Confirmed on 04/14/20 Will call and update family  Patient critically ill due to COVID ARDS Interventions to address this today vent titration Risk of deterioration without these interventions  is high  I personally spent 33 minutes providing critical care not including any separately billable  procedures  Myrla Halsted MD [facility] Prefer epic messenger for cross cover needs If after hours, please call E-link

## 2020-04-16 NOTE — Progress Notes (Signed)
Daughter called unit, updated on pt's condition. Passed her and other family member's number to [facility] to set up video visit.

## 2020-04-16 NOTE — Plan of Care (Signed)

## 2020-04-16 NOTE — Progress Notes (Signed)
Physical Therapy Discharge Patient Details Name: Sandra [name] MRN: [id number] DOB: 1975/10/10 Today's Date: 04/16/2020 Time:  -     Patient discharged from PT services secondary to medical decline - will need to re-order PT to resume therapy services.  Please see latest therapy progress note for current level of functioning and progress toward goals.    Progress and discharge plan discussed with patient and/or caregiver: Patient unable to participate in discharge planning and no caregivers available  GP     Berline Lopes 04/16/2020, 3:11 PM  Akari Crysler W,PT Acute Rehabilitation Services Pager:  [phone]  Office:  [phone]

## 2020-04-16 NOTE — Progress Notes (Signed)
Called daughter and gave update.

## 2020-04-17 ENCOUNTER — Inpatient Hospital Stay (HOSPITAL_COMMUNITY): Payer: Medicare Other

## 2020-04-17 DIAGNOSIS — U071 COVID-19: Secondary | ICD-10-CM | POA: Diagnosis not present

## 2020-04-17 DIAGNOSIS — J1282 Pneumonia due to coronavirus disease 2019: Secondary | ICD-10-CM | POA: Diagnosis not present

## 2020-04-17 DIAGNOSIS — J069 Acute upper respiratory infection, unspecified: Secondary | ICD-10-CM | POA: Diagnosis not present

## 2020-04-17 LAB — COMPREHENSIVE METABOLIC PANEL
ALT: 113 U/L — ABNORMAL HIGH (ref 0–44)
AST: 66 U/L — ABNORMAL HIGH (ref 15–41)
Albumin: 2.9 g/dL — ABNORMAL LOW (ref 3.5–5.0)
Alkaline Phosphatase: 99 U/L (ref 38–126)
Anion gap: 6 (ref 5–15)
BUN: 21 mg/dL — ABNORMAL HIGH (ref 6–20)
CO2: 31 mmol/L (ref 22–32)
Calcium: 8.6 mg/dL — ABNORMAL LOW (ref 8.9–10.3)
Chloride: 107 mmol/L (ref 98–111)
Creatinine, Ser: 0.72 mg/dL (ref 0.44–1.00)
GFR, Estimated: >60 mL/min (ref >60)
Glucose, Bld: 160 mg/dL — ABNORMAL HIGH (ref 70–99)
Potassium: 3.8 mmol/L (ref 3.5–5.1)
Sodium: 144 mmol/L (ref 135–145)
Total Bilirubin: 0.6 mg/dL (ref 0.3–1.2)
Total Protein: 5.6 g/dL — ABNORMAL LOW (ref 6.5–8.1)

## 2020-04-17 LAB — CBC
HCT: 27.7 % — ABNORMAL LOW (ref 36.0–46.0)
Hemoglobin: 8.5 g/dL — ABNORMAL LOW (ref 12.0–15.0)
MCH: 29.5 pg (ref 26.0–34.0)
MCHC: 30.7 g/dL (ref 30.0–36.0)
MCV: 96.2 fL (ref 80.0–100.0)
Platelets: 141 10*3/uL — ABNORMAL LOW (ref 150–400)
RBC: 2.88 MIL/uL — ABNORMAL LOW (ref 3.87–5.11)
RDW: 16 % — ABNORMAL HIGH (ref 11.5–15.5)
WBC: 8.5 10*3/uL (ref 4.0–10.5)
nRBC: 0 % (ref 0.0–0.2)

## 2020-04-17 LAB — BLOOD GAS, ARTERIAL
Acid-Base Excess: 5.6 mmol/L — ABNORMAL HIGH (ref 0.0–2.0)
Bicarbonate: 31.4 mmol/L — ABNORMAL HIGH (ref 20.0–28.0)
Drawn by: 511331
FIO2: 100
O2 Saturation: 94.8 %
Patient temperature: 37.5
pCO2 arterial: 64.8 mmHg — ABNORMAL HIGH (ref 32.0–48.0)
pH, Arterial: 7.31 — ABNORMAL LOW (ref 7.350–7.450)
pO2, Arterial: 83.7 mmHg (ref 83.0–108.0)

## 2020-04-17 LAB — POCT I-STAT 7, (LYTES, BLD GAS, ICA,H+H)
Acid-Base Excess: 4 mmol/L — ABNORMAL HIGH (ref 0.0–2.0)
Bicarbonate: 33.1 mmol/L — ABNORMAL HIGH (ref 20.0–28.0)
Calcium, Ion: 1.3 mmol/L (ref 1.15–1.40)
HCT: 44 % (ref 36.0–46.0)
Hemoglobin: 15 g/dL (ref 12.0–15.0)
O2 Saturation: 93 %
Patient temperature: 99.1
Potassium: 4 mmol/L (ref 3.5–5.1)
Sodium: 144 mmol/L (ref 135–145)
TCO2: 35 mmol/L — ABNORMAL HIGH (ref 22–32)
pCO2 arterial: 73.4 mmHg (ref 32.0–48.0)
pH, Arterial: 7.264 — ABNORMAL LOW (ref 7.350–7.450)
pO2, Arterial: 82 mmHg — ABNORMAL LOW (ref 83.0–108.0)

## 2020-04-17 LAB — GLUCOSE, CAPILLARY
Glucose-Capillary: 154 mg/dL — ABNORMAL HIGH (ref 70–99)
Glucose-Capillary: 182 mg/dL — ABNORMAL HIGH (ref 70–99)
Glucose-Capillary: 193 mg/dL — ABNORMAL HIGH (ref 70–99)
Glucose-Capillary: 207 mg/dL — ABNORMAL HIGH (ref 70–99)
Glucose-Capillary: 251 mg/dL — ABNORMAL HIGH (ref 70–99)
Glucose-Capillary: 253 mg/dL — ABNORMAL HIGH (ref 70–99)
Glucose-Capillary: 262 mg/dL — ABNORMAL HIGH (ref 70–99)

## 2020-04-17 LAB — PHOSPHORUS: Phosphorus: 2.9 mg/dL (ref 2.5–4.6)

## 2020-04-17 LAB — MAGNESIUM: Magnesium: 2.2 mg/dL (ref 1.7–2.4)

## 2020-04-17 MED ORDER — MIDAZOLAM 50MG/50ML (1MG/ML) PREMIX INFUSION: 2.0000 mg/h | INTRAVENOUS | Status: DC

## 2020-04-17 MED ORDER — SODIUM CHLORIDE 0.9 % IV SOLN
0.5000 mg/h | INTRAVENOUS | Status: DC
  Administered 2020-04-17: 15:00:00 13 mg/h via INTRAVENOUS
  Administered 2020-04-18 – 2020-04-19 (×2): 10 mg/h via INTRAVENOUS
  Administered 2020-04-20: 23:00:00 15 mg/h via INTRAVENOUS
  Administered 2020-04-22: 13 mg/h via INTRAVENOUS
  Administered 2020-04-24 – 2020-04-26 (×3): 10 mg/h via INTRAVENOUS
  Administered 2020-04-27: 2 mg/h via INTRAVENOUS
  Filled 2020-04-17: qty 30
  Filled 2020-04-17 (×12): qty 50
  Filled 2020-04-17: qty 30
  Filled 2020-04-17 (×2): qty 50

## 2020-04-17 MED ORDER — POTASSIUM CHLORIDE 10 MEQ/50ML IV SOLN
10.0000 meq | INTRAVENOUS | Status: AC
  Administered 2020-04-17 (×6): 10 meq via INTRAVENOUS
  Filled 2020-04-17 (×6): qty 50

## 2020-04-17 MED ORDER — METOLAZONE 5 MG PO TABS
5.0000 mg | ORAL_TABLET | Freq: Once | ORAL | Status: AC
  Administered 2020-04-17: 5 mg via ORAL
  Filled 2020-04-17: qty 1

## 2020-04-17 MED ORDER — OXYMETAZOLINE HCL 0.05 % NA SOLN
1.0000 | Freq: Two times a day (BID) | NASAL | Status: DC
  Administered 2020-04-17 – 2020-04-26 (×11): 1 via NASAL
  Filled 2020-04-17: qty 30

## 2020-04-17 MED ORDER — PIVOT 1.5 CAL PO LIQD
1000.0000 mL | ORAL | Status: DC
  Administered 2020-04-17 – 2020-04-24 (×8): 1000 mL

## 2020-04-17 MED ORDER — ORAL CARE MOUTH RINSE
15.0000 mL | OROMUCOSAL | Status: DC
  Administered 2020-04-17 – 2020-04-27 (×99): 15 mL via OROMUCOSAL

## 2020-04-17 MED ORDER — INSULIN DETEMIR 100 UNIT/ML ~~LOC~~ SOLN
28.0000 [IU] | Freq: Two times a day (BID) | SUBCUTANEOUS | Status: DC
  Administered 2020-04-17: 28 [IU] via SUBCUTANEOUS
  Filled 2020-04-17 (×3): qty 0.28

## 2020-04-17 NOTE — Plan of Care (Signed)
Goal: Will remain free from infection Outcome: Progressing  Goal: Cardiovascular complication will be avoided Outcome: Progressing    Problem: Nutrition: Goal: Adequate nutrition will be maintained Outcome: Progressing  Problem: Elimination: Goal: Will not experience complications related to bowel motility Outcome: Progressing Goal: Will not experience complications related to urinary retention Outcome: Progressing   Problem: Pain Managment: Goal: General experience of comfort will improve Outcome: Progressing   Problem: Safety: Goal: Ability to remain free from injury will improve Outcome: Progressing   Problem: Skin Integrity: Goal: Risk for impaired skin integrity will decrease Outcome: Progressing

## 2020-04-17 NOTE — Progress Notes (Signed)
Assisted family with tele-visit via elink 

## 2020-04-17 NOTE — Progress Notes (Signed)
ABG obtained and sent to lab, lab aware.

## 2020-04-17 NOTE — Progress Notes (Signed)
NAME:  Sandra [name], MRN:  [id number], DOB:  04/25/1975, LOS: 18 ADMISSION DATE:  04/22/2020,   Brief History:  This is a 45 year old female with COVID-19 pneumonia and ARDS  History of Present Illness:  Is a 45 year old unvaccinated white female who presented to the hospital on 04/19/2020 with Covid pneumonia.  Over the next 14 days she has developed Covid ARDS.  She progressed from high flow nasal cannula to BiPAP and then ultimately was intubated on 04/12/2020.  She has been treated with remdesivir steroids and baricitnib.  On 02/14 patient had a small segmental right lower lobe pulmonary embolism and was converted to full dose Lovenox.   Past Medical History:  Anxiety Arthritis Depression Fibromyalgia GERD Gastric ulceration Hiatal hernia POTS Hypertension Morbid obesity  Significant Hospital Events:  Intubation  Consults:  Critical care medicine  Procedures:  04/13/20 intubated 04/13/2020 triple-lumen catheter placed  Significant Diagnostic Tests:    Micro Data:  Covid + 04/09/2020  Antimicrobials:  Levaquin  Subjective  No events. Held off on proning yesterday due to improving oxygenation with paralytic unfortunately now oxygenation worse.  Objective   Blood pressure (!) 121/56, pulse 86, temperature 99.3 F (37.4 C), temperature source Axillary, resp. rate (!) 23, height 5\' 4"  (1.626 m), weight 120 kg, SpO2 90 %.    Vent Mode: PRVC FiO2 (%):  [90 %-100 %] 100 % Set Rate:  [23 bmp] 23 bmp Vt Set:  [490 mL] 490 mL PEEP:  [12 cmH20-16 cmH20] 12 cmH20 Plateau Pressure:  [28 cmH20-35 cmH20] 34 cmH20   Intake/Output Summary (Last 24 hours) at 04/17/2020 0847 Last data filed at 04/17/2020 0700 Gross per 24 hour  Intake 3554.91 ml  Output 1710 ml  Net 1844.91 ml   Filed Weights   04/15/20 0600 04/16/20 0400 04/17/20 0500  Weight: 122.7 kg 117.6 kg 120 kg    Examination: Constitutional: sedated paralyzed woman in NAD  Eyes: pinpoint Ears, nose,  mouth, and throat: ETT in place, minimal secretions Cardiovascular: RRR, ext warm Respiratory: diminished bases, passive on vent Gastrointestinal: soft, hypoactive BS Skin: No rashes, normal turgor Neurologic: paralyzed Psychiatric: cannot assess  ETT low on CXR: retracted this AM   Assessment & Plan:  Acute respiratory failure Covid ARDS Suspected pulmonary edema superimposed Covid pneumonia Morbid obesity Idiopathic intracranial hypertension s/p VP shunt POTS Chronic pain, fibromyalgia COVID- associated pulmonary embolsim Enterococcal UTI- treated with levaquin Hyperglycemia- increased levemir 04/17/20 Constipation- ongoing  - ARDSnet PEEP/FiO2, attempt to limit driving pressures as allowed by lung compliance - VAP prevention bundle -16/8h proning with A/a gradients < 150, adjust based on clinical response - Sedation to minimize shearing forces created by ventilator assynchrony: - continue NMB with fent/versed for today - Keep event to neg with balanced diuresis, dose of metolazone today - Decrease IV steroids: taper as ordered - Continue current insulin regimen given decrease in steroids: seems okay today - Sorbitol/miralax/dulcolax and qod relistor until we have BM, enema once supine again - Off isolation 2/25: 21 day from +  Best practice (evaluated daily)  Diet: TF pain/Anxiety/Delirium protocol (if indicated): fent/versed VAP protocol (if indicated): in place DVT prophylaxis: Full dose Lovenox GI prophylaxis: Protonix Glucose control: basal bolus as ordered Mobility: Bedrest Disposition: Intensive care unit  Goals of Care:   Code Status: Full Confirmed on 04/14/20 Will call and update family  Patient critically ill due to COVID ARDS Interventions to address this today vent titration Risk of deterioration without these interventions is high  I personally spent 31  minutes providing critical care not including any separately billable procedures  Myrla Halsted  MD [facility] Prefer epic messenger for cross cover needs If after hours, please call E-link

## 2020-04-17 NOTE — Progress Notes (Addendum)
Nutrition Follow-up   DOCUMENTATION CODES:   Morbid obesity  INTERVENTION:   Plan Cortrak tomorrow.   Miralax, colace, and sorbitol given this am. Patient now with liquid stool per nursing, may be able to decrease or change regimen PRN.   Tube feeding:  -Pivot 1.5 @ 60 ml/hr via OG (1440 ml) -Prosource TF 45 ml BID -Free water 200 ml Q6 hours   Provides: 2240 kcals, 157 grams protein, 1080 ml free water (1880 ml with flushes)  NUTRITION DIAGNOSIS:   Increased nutrient needs related to catabolic illness (SPZZC-02 infection) as evidenced by estimated needs   Ongoing  GOAL:   Provide needs based on ASPEN/SCCM guidelines   Addressed via TF  MONITOR:   Vent status,TF tolerance,Labs,Weight trends  REASON FOR ASSESSMENT:   Consult Enteral/tube feeding initiation and management  ASSESSMENT:   45 year old female admitted with acute respiratory failure with hypoxia secondary to COVID-19 pneumonia. Past medical history of GERD, vertigo, depression, anxiety, pseudomotor cerebri, benign intracranial hypertension with VP shunt presented with worsening SOB, cough, fatigue, and body aches since testing positive for Covid 7 days ago with home kit.   2/5- admitted [facility] 2/18- transferred to [facility] ICU, intubated 2/19- transferred to [facility]  Pt discussed during ICU rounds and with RN.   Requiring paralytic. Off propofol. Proning today (16/8hrs). Currently receiving Pivot 1.5 @ 50 ml/hr. Adjust rate to better meet needs. Having liquids stools. May be able to decrease or d/c scheduled bowel regimen.   CBGs elevated. Continues on steroids. Levemir increased.   Patient remains intubated on ventilator support MV: 10.7 L/min Temp (24hrs), Avg:99.8 F (37.7 C), Min:98.8 F (37.1 C), Max:101.3 F (38.5 C)   Admission weight: 123.3 kg  Current weight: 120 kg   UOP: 1710 ml x 24 hrs  Stool: 275 ml x 24 hrs   Drips: nimbex, levophed, 10 mEq KCl x5 runs Medications: 500 mg Vitamin C,  dulcolax, Vitamin D, colace, SS novolog, levemir, solumedrol, miralax, 200 mg zinc sulfate Labs: CBG 160-257 TGs 349 (2/20)  Diet Order:   Diet Order            Diet NPO time specified  Diet effective now                 EDUCATION NEEDS:   Not appropriate for education at this time  Skin:  Skin Assessment: Reviewed RN Assessment  Last BM:  2/22 via rectal tube  Height:   Ht Readings from Last 1 Encounters:  04/13/20 _0  (1.626 m)    Weight:   Wt Readings from Last 1 Encounters:  04/17/20 120 kg   Adjusted Body Weight: 81.1 kg   Estimated Nutritional Needs:   Kcal:  2179-8102 kcal   Protein:  145-160 grams   Fluid:  >/= 2 L/day  [name] RD, LDN Clinical Nutrition Pager listed in [place]

## 2020-04-17 NOTE — Progress Notes (Signed)
ABG reported to elink.

## 2020-04-17 NOTE — Progress Notes (Signed)
eLink Physician-Brief Progress Note Patient Name: Sandra [name] DOB: [birth year] MRN: [id number]   Date of Service  04/17/2020  HPI/Events of Note  ABG on 100%/PRVC 23/TV 490/P 12 = 7.31/64.8/83.7.  eICU Interventions  Continue present ventilator management.      Intervention Category Major Interventions: Respiratory failure - evaluation and management  Sandra [name] 04/17/2020, 5:11 AM

## 2020-04-17 NOTE — Progress Notes (Signed)
Due to desat post proning, a recruitment maneuver was completed on PC 20, peep 20, RR 9, 100%, iT 3.00.  Sats improved to 86%.  After recruitment vent changes made per DR. Katrinka Blazing, CCM.

## 2020-04-17 NOTE — Progress Notes (Signed)
Assited family with tele-visit via elink

## 2020-04-18 ENCOUNTER — Inpatient Hospital Stay (HOSPITAL_COMMUNITY): Payer: Medicare Other

## 2020-04-18 DIAGNOSIS — J1282 Pneumonia due to coronavirus disease 2019: Secondary | ICD-10-CM | POA: Diagnosis not present

## 2020-04-18 DIAGNOSIS — U071 COVID-19: Secondary | ICD-10-CM | POA: Diagnosis not present

## 2020-04-18 LAB — POCT I-STAT 7, (LYTES, BLD GAS, ICA,H+H)
Acid-Base Excess: 9 mmol/L — ABNORMAL HIGH (ref 0.0–2.0)
Acid-Base Excess: 9 mmol/L — ABNORMAL HIGH (ref 0.0–2.0)
Bicarbonate: 36.4 mmol/L — ABNORMAL HIGH (ref 20.0–28.0)
Bicarbonate: 36.6 mmol/L — ABNORMAL HIGH (ref 20.0–28.0)
Calcium, Ion: 1.3 mmol/L (ref 1.15–1.40)
Calcium, Ion: 1.25 mmol/L (ref 1.15–1.40)
HCT: 32 % — ABNORMAL LOW (ref 36.0–46.0)
HCT: 27 % — ABNORMAL LOW (ref 36.0–46.0)
Hemoglobin: 10.9 g/dL — ABNORMAL LOW (ref 12.0–15.0)
Hemoglobin: 9.2 g/dL — ABNORMAL LOW (ref 12.0–15.0)
O2 Saturation: 99 %
O2 Saturation: 99 %
Patient temperature: 98.1
Potassium: 4.5 mmol/L (ref 3.5–5.1)
Potassium: 4.4 mmol/L (ref 3.5–5.1)
Sodium: 138 mmol/L (ref 135–145)
Sodium: 135 mmol/L (ref 135–145)
TCO2: 38 mmol/L — ABNORMAL HIGH (ref 22–32)
TCO2: 39 mmol/L — ABNORMAL HIGH (ref 22–32)
pCO2 arterial: 67.1 mmHg (ref 32.0–48.0)
pCO2 arterial: 66.4 mmHg (ref 32.0–48.0)
pH, Arterial: 7.341 — ABNORMAL LOW (ref 7.350–7.450)
pH, Arterial: 7.349 — ABNORMAL LOW (ref 7.350–7.450)
pO2, Arterial: 132 mmHg — ABNORMAL HIGH (ref 83.0–108.0)
pO2, Arterial: 161 mmHg — ABNORMAL HIGH (ref 83.0–108.0)

## 2020-04-18 LAB — COMPREHENSIVE METABOLIC PANEL
ALT: 131 U/L — ABNORMAL HIGH (ref 0–44)
AST: 38 U/L (ref 15–41)
Albumin: 2.7 g/dL — ABNORMAL LOW (ref 3.5–5.0)
Alkaline Phosphatase: 96 U/L (ref 38–126)
Anion gap: 9 (ref 5–15)
BUN: 26 mg/dL — ABNORMAL HIGH (ref 6–20)
CO2: 33 mmol/L — ABNORMAL HIGH (ref 22–32)
Calcium: 8.9 mg/dL (ref 8.9–10.3)
Chloride: 97 mmol/L — ABNORMAL LOW (ref 98–111)
Creatinine, Ser: 0.72 mg/dL (ref 0.44–1.00)
GFR, Estimated: >60 mL/min (ref >60)
Glucose, Bld: 262 mg/dL — ABNORMAL HIGH (ref 70–99)
Potassium: 4.4 mmol/L (ref 3.5–5.1)
Sodium: 139 mmol/L (ref 135–145)
Total Bilirubin: 0.6 mg/dL (ref 0.3–1.2)
Total Protein: 5.5 g/dL — ABNORMAL LOW (ref 6.5–8.1)

## 2020-04-18 LAB — CBC
HCT: 25.7 % — ABNORMAL LOW (ref 36.0–46.0)
Hemoglobin: 7.8 g/dL — ABNORMAL LOW (ref 12.0–15.0)
MCH: 29.4 pg (ref 26.0–34.0)
MCHC: 30.4 g/dL (ref 30.0–36.0)
MCV: 97 fL (ref 80.0–100.0)
Platelets: 112 10*3/uL — ABNORMAL LOW (ref 150–400)
RBC: 2.65 MIL/uL — ABNORMAL LOW (ref 3.87–5.11)
RDW: 15.4 % (ref 11.5–15.5)
WBC: 5.9 10*3/uL (ref 4.0–10.5)
nRBC: 0 % (ref 0.0–0.2)

## 2020-04-18 LAB — GLUCOSE, CAPILLARY
Glucose-Capillary: 243 mg/dL — ABNORMAL HIGH (ref 70–99)
Glucose-Capillary: 260 mg/dL — ABNORMAL HIGH (ref 70–99)
Glucose-Capillary: 228 mg/dL — ABNORMAL HIGH (ref 70–99)
Glucose-Capillary: 283 mg/dL — ABNORMAL HIGH (ref 70–99)
Glucose-Capillary: 267 mg/dL — ABNORMAL HIGH (ref 70–99)
Glucose-Capillary: 243 mg/dL — ABNORMAL HIGH (ref 70–99)

## 2020-04-18 LAB — PHOSPHORUS: Phosphorus: 3.2 mg/dL (ref 2.5–4.6)

## 2020-04-18 LAB — MAGNESIUM: Magnesium: 2.1 mg/dL (ref 1.7–2.4)

## 2020-04-18 MED ORDER — INSULIN DETEMIR 100 UNIT/ML ~~LOC~~ SOLN
38.0000 [IU] | Freq: Two times a day (BID) | SUBCUTANEOUS | Status: DC
  Administered 2020-04-18 – 2020-04-19 (×3): 38 [IU] via SUBCUTANEOUS
  Filled 2020-04-18 (×4): qty 0.38

## 2020-04-18 MED ORDER — ACETAZOLAMIDE 250 MG PO TABS
500.0000 mg | ORAL_TABLET | Freq: Two times a day (BID) | ORAL | Status: AC
  Administered 2020-04-18: 500 mg
  Filled 2020-04-18: qty 2

## 2020-04-18 MED ORDER — NYSTATIN 100000 UNIT/ML MT SUSP
5.0000 mL | Freq: Four times a day (QID) | OROMUCOSAL | Status: DC
  Administered 2020-04-18 – 2020-04-22 (×14): 500000 [IU]
  Filled 2020-04-18 (×14): qty 5

## 2020-04-18 MED ORDER — ACETAZOLAMIDE 250 MG PO TABS
500.0000 mg | ORAL_TABLET | Freq: Two times a day (BID) | ORAL | Status: DC
  Administered 2020-04-18: 500 mg via ORAL
  Filled 2020-04-18 (×2): qty 2

## 2020-04-18 MED ORDER — VITAMIN D 25 MCG (1000 UNIT) PO TABS
25.0000 ug | ORAL_TABLET | Freq: Every day | ORAL | Status: DC
  Administered 2020-04-19 – 2020-04-26 (×8): 25 ug
  Filled 2020-04-18 (×8): qty 1

## 2020-04-18 MED ORDER — FLUOXETINE HCL 20 MG PO CAPS
40.0000 mg | ORAL_CAPSULE | Freq: Every day | ORAL | Status: DC
  Administered 2020-04-19 – 2020-04-24 (×6): 40 mg
  Filled 2020-04-18 (×6): qty 2

## 2020-04-18 MED ORDER — SODIUM CHLORIDE 0.9 % IV SOLN
2.0000 g | INTRAVENOUS | Status: DC
  Administered 2020-04-18 – 2020-04-22 (×5): 2 g via INTRAVENOUS
  Filled 2020-04-18: qty 20
  Filled 2020-04-18: qty 2
  Filled 2020-04-18 (×5): qty 20

## 2020-04-18 MED ORDER — ASCORBIC ACID 500 MG PO TABS
500.0000 mg | ORAL_TABLET | Freq: Every day | ORAL | Status: DC
  Administered 2020-04-19 – 2020-04-26 (×8): 500 mg
  Filled 2020-04-18 (×8): qty 1

## 2020-04-18 MED ORDER — ZINC SULFATE 220 (50 ZN) MG PO CAPS
220.0000 mg | ORAL_CAPSULE | Freq: Every day | ORAL | Status: DC
  Administered 2020-04-19 – 2020-04-26 (×8): 220 mg
  Filled 2020-04-18 (×8): qty 1

## 2020-04-18 NOTE — Progress Notes (Addendum)
eLink Physician-Brief Progress Note Patient Name: Sandra [name] DOB: [birth year] MRN: [id number]   Date of Service  04/18/2020  HPI/Events of Note  Notified of ABG results - 7.341/67.1/132. Mg 2.1, K 4.4, crea 0.72. Hgb 7.8.  eICU Interventions  Continue with current vent settings.  Continue to monitor while on full dose lovenox.      Intervention Category Minor Interventions: Other:  Larinda Buttery 04/18/2020, 6:18 AM

## 2020-04-18 NOTE — Progress Notes (Addendum)
NAME:  Sandra [name], MRN:  [id number], DOB:  [birth year], LOS: 19 ADMISSION DATE:  2020/04/19,   Brief History:  This is a 45 year old female with COVID-19 pneumonia and ARDS  History of Present Illness:  Is a 44 year old unvaccinated white female who presented to the hospital on 2020-04-19 with Covid pneumonia.  Over the next 14 days she has developed Covid ARDS.  She progressed from high flow nasal cannula to BiPAP and then ultimately was intubated on 04/12/2020.  She has been treated with remdesivir steroids and baricitnib.  On 02/14 patient had a small segmental right lower lobe pulmonary embolism and was converted to full dose Lovenox.   Past Medical History:  Anxiety Arthritis Depression Fibromyalgia GERD Gastric ulceration Hiatal hernia POTS Hypertension Morbid obesity  Significant Hospital Events:  Intubation  Consults:  Critical care medicine  Procedures:  04/13/20 intubated 04/13/2020 triple-lumen catheter placed  Significant Diagnostic Tests:    Micro Data:  Covid + 19-Apr-2020  Antimicrobials:  Levaquin  Subjective  No events. Supine now. Sedated/paralyzed.  Objective   Blood pressure (!) 106/44, pulse 65, temperature 98.1 F (36.7 C), temperature source Axillary, resp. rate (!) 28, height 5\' 4"  (1.626 m), weight 122.2 kg, SpO2 99 %.    Vent Mode: PRVC FiO2 (%):  [100 %] 100 % Set Rate:  [23 bmp-28 bmp] 28 bmp Vt Set:  [400 mL-490 mL] 400 mL PEEP:  [12 cmH20-16 cmH20] 16 cmH20 Plateau Pressure:  [28 cmH20-38 cmH20] 28 cmH20   Intake/Output Summary (Last 24 hours) at 04/18/2020 04/20/2020 Last data filed at 04/18/2020 0700 Gross per 24 hour  Intake 4982.2 ml  Output 3440 ml  Net 1542.2 ml   Filed Weights   04/16/20 0400 04/17/20 0500 04/18/20 0424  Weight: 117.6 kg 120 kg 122.2 kg    Examination: Constitutional: sedated paralyzed woman in NAD  Eyes: pinpoint Ears, nose, mouth, and throat: ETT in place, minimal secretions, nares  packed Cardiovascular: RRR, ext warm Respiratory: clear apices, diminished bases Gastrointestinal: +BS Skin: No rashes, normal turgor Neurologic: paralyzed Psychiatric: cannot assess  P/F 132 supine DP 19, PEEP 16, Plat 35 H/H a bit odwn Net + a bit  Assessment & Plan:  Acute respiratory failure Covid ARDS COVID- associated pulmonary embolsim Suspected pulmonary edema superimposed Covid pneumonia Morbid obesity Idiopathic intracranial hypertension s/p VP shunt POTS Chronic pain, fibromyalgia Enterococcal UTI- treated with levaquin Hyperglycemia- worse today Constipation- ongoing ABLA from nose bleeding- after proning, required packing x 2 2/22  - ARDSnet PEEP/FiO2, attempt to limit driving pressures as allowed by lung compliance - VAP prevention bundle -16/8h proning with A/a gradients < 150, adjust based on clinical response - Sedation to minimize shearing forces created by ventilator assynchrony: - wean NMB today - Continue packing while proning for now, ceftriaxone for TSS ppx - Keep event to neg with balanced diuresis, stop FWF, 2 doses diamox today - Decrease IV steroids: taper as ordered - Continue current insulin regimen given decrease in steroids: increase from 28 levemir to 38 - Continue bowel regimen - Off isolation 2/25: 21 day from +  Best practice (evaluated daily)  Diet: TF pain/Anxiety/Delirium protocol (if indicated): fent/versed VAP protocol (if indicated): in place DVT prophylaxis: Full dose Lovenox GI prophylaxis: Protonix Glucose control: basal bolus as ordered Mobility: Bedrest Disposition: Intensive care unit  Goals of Care:   Code Status: Full Confirmed on 04/14/20 Nurse present, all measures, GOC next due 2/26 Told her on 2/22 estimated survival chances around 20% with present clinical picture  Patient critically ill due to COVID ARDS Interventions to address this today vent titration Risk of deterioration without these interventions is  high  I personally spent 35 minutes providing critical care not including any separately billable procedures  Myrla Halsted MD [facility] Prefer epic messenger for cross cover needs If after hours, please call E-link

## 2020-04-18 NOTE — Progress Notes (Signed)
Patient maneuvered back into supine position at 0100 (previously prone); 5 person assist including respiratory therapist. No cardiovascular or respiratory complications noted at this time. VSS, patient sp02 98%. Will continue to monitor.

## 2020-04-18 NOTE — Progress Notes (Signed)
Patient placed in supine position and tolerated procedure well with no complications noted. RT removed cloth tape and secured ETT with commercial tube holder at 23cm at the lip. Bilateral breath sounds are equal and inspiratory and expiratory VT match. Spo2 is currently 97%.  RT will continue to monitor as needed.

## 2020-04-18 NOTE — Progress Notes (Signed)
Cortrak Tube Team Note:  Consult received to place a Cortrak feeding tube.   Arrived to place Cortrak feeding tube. OGT was removed. Ultimately unable to place Cortrak tube as patient with nasal packing in place that is unable to be removed. Plan is for RN to replace OGT at this time. Patient was previously tolerating tube feeds at goal rate via OGT. Cortrak tube placement can be attempted at later date.  Felix Pacini, MS, RD, LDN Pager number available on Amion

## 2020-04-18 NOTE — Procedures (Signed)
Nasal packing Indication: significant epistaxis from both nares Rhinorockets placed in both nares with resolution in epistaxis  Myrla Halsted MD PCCM

## 2020-04-19 ENCOUNTER — Inpatient Hospital Stay (HOSPITAL_COMMUNITY): Payer: Medicare Other

## 2020-04-19 DIAGNOSIS — U071 COVID-19: Secondary | ICD-10-CM | POA: Diagnosis not present

## 2020-04-19 DIAGNOSIS — J069 Acute upper respiratory infection, unspecified: Secondary | ICD-10-CM | POA: Diagnosis not present

## 2020-04-19 DIAGNOSIS — J1282 Pneumonia due to coronavirus disease 2019: Secondary | ICD-10-CM | POA: Diagnosis not present

## 2020-04-19 LAB — CBC
HCT: 28.8 % — ABNORMAL LOW (ref 36.0–46.0)
Hemoglobin: 8.5 g/dL — ABNORMAL LOW (ref 12.0–15.0)
MCH: 28.7 pg (ref 26.0–34.0)
MCHC: 29.5 g/dL — ABNORMAL LOW (ref 30.0–36.0)
MCV: 97.3 fL (ref 80.0–100.0)
Platelets: 132 10*3/uL — ABNORMAL LOW (ref 150–400)
RBC: 2.96 MIL/uL — ABNORMAL LOW (ref 3.87–5.11)
RDW: 15.3 % (ref 11.5–15.5)
WBC: 6.7 10*3/uL (ref 4.0–10.5)
nRBC: 0 % (ref 0.0–0.2)

## 2020-04-19 LAB — COMPREHENSIVE METABOLIC PANEL
ALT: 94 U/L — ABNORMAL HIGH (ref 0–44)
AST: 17 U/L (ref 15–41)
Albumin: 2.7 g/dL — ABNORMAL LOW (ref 3.5–5.0)
Alkaline Phosphatase: 99 U/L (ref 38–126)
Anion gap: 10 (ref 5–15)
BUN: 33 mg/dL — ABNORMAL HIGH (ref 6–20)
CO2: 33 mmol/L — ABNORMAL HIGH (ref 22–32)
Calcium: 8.9 mg/dL (ref 8.9–10.3)
Chloride: 95 mmol/L — ABNORMAL LOW (ref 98–111)
Creatinine, Ser: 0.76 mg/dL (ref 0.44–1.00)
GFR, Estimated: >60 mL/min (ref >60)
Glucose, Bld: 256 mg/dL — ABNORMAL HIGH (ref 70–99)
Potassium: 3.7 mmol/L (ref 3.5–5.1)
Sodium: 138 mmol/L (ref 135–145)
Total Bilirubin: 0.5 mg/dL (ref 0.3–1.2)
Total Protein: 5.8 g/dL — ABNORMAL LOW (ref 6.5–8.1)

## 2020-04-19 LAB — GLUCOSE, CAPILLARY
Glucose-Capillary: 177 mg/dL — ABNORMAL HIGH (ref 70–99)
Glucose-Capillary: 222 mg/dL — ABNORMAL HIGH (ref 70–99)
Glucose-Capillary: 244 mg/dL — ABNORMAL HIGH (ref 70–99)
Glucose-Capillary: 278 mg/dL — ABNORMAL HIGH (ref 70–99)
Glucose-Capillary: 297 mg/dL — ABNORMAL HIGH (ref 70–99)
Glucose-Capillary: 301 mg/dL — ABNORMAL HIGH (ref 70–99)
Glucose-Capillary: 320 mg/dL — ABNORMAL HIGH (ref 70–99)

## 2020-04-19 LAB — MAGNESIUM: Magnesium: 2 mg/dL (ref 1.7–2.4)

## 2020-04-19 LAB — PHOSPHORUS: Phosphorus: 3.9 mg/dL (ref 2.5–4.6)

## 2020-04-19 MED ORDER — INSULIN ASPART 100 UNIT/ML ~~LOC~~ SOLN
6.0000 [IU] | SUBCUTANEOUS | Status: DC
  Administered 2020-04-19 – 2020-04-23 (×26): 6 [IU] via SUBCUTANEOUS

## 2020-04-19 MED ORDER — QUETIAPINE FUMARATE 50 MG PO TABS
50.0000 mg | ORAL_TABLET | Freq: Two times a day (BID) | ORAL | Status: DC
  Administered 2020-04-19 – 2020-04-26 (×14): 50 mg
  Filled 2020-04-19 (×14): qty 1

## 2020-04-19 MED ORDER — CLONAZEPAM 1 MG PO TABS: 1.0000 mg | ORAL_TABLET | Freq: Three times a day (TID) | ORAL | Status: DC

## 2020-04-19 MED ORDER — QUETIAPINE FUMARATE 50 MG PO TABS
50.0000 mg | ORAL_TABLET | Freq: Two times a day (BID) | ORAL | Status: DC
Start: 2020-04-19 — End: 2020-04-19
  Administered 2020-04-19: 50 mg via ORAL
  Filled 2020-04-19: qty 1

## 2020-04-19 MED ORDER — ACETAZOLAMIDE 250 MG PO TABS
250.0000 mg | ORAL_TABLET | Freq: Two times a day (BID) | ORAL | Status: DC
  Administered 2020-04-19: 250 mg via ORAL
  Filled 2020-04-19: qty 1

## 2020-04-19 MED ORDER — FUROSEMIDE 10 MG/ML IJ SOLN
40.0000 mg | Freq: Two times a day (BID) | INTRAMUSCULAR | Status: AC
  Administered 2020-04-19 (×2): 40 mg via INTRAVENOUS
  Filled 2020-04-19 (×2): qty 4

## 2020-04-19 MED ORDER — INSULIN DETEMIR 100 UNIT/ML ~~LOC~~ SOLN
12.0000 [IU] | Freq: Once | SUBCUTANEOUS | Status: DC
  Filled 2020-04-19: qty 0.12

## 2020-04-19 MED ORDER — CLONAZEPAM 1 MG PO TABS
1.0000 mg | ORAL_TABLET | Freq: Three times a day (TID) | ORAL | Status: DC
  Administered 2020-04-19 – 2020-04-21 (×6): 1 mg
  Filled 2020-04-19 (×6): qty 1

## 2020-04-19 MED ORDER — ALTEPLASE 2 MG IJ SOLR
2.0000 mg | Freq: Once | INTRAMUSCULAR | Status: DC
  Filled 2020-04-19: qty 2

## 2020-04-19 MED ORDER — INSULIN DETEMIR 100 UNIT/ML ~~LOC~~ SOLN
50.0000 [IU] | Freq: Two times a day (BID) | SUBCUTANEOUS | Status: DC
  Administered 2020-04-19 – 2020-04-23 (×8): 50 [IU] via SUBCUTANEOUS
  Filled 2020-04-19 (×11): qty 0.5

## 2020-04-19 MED ORDER — ACETAZOLAMIDE 250 MG PO TABS
250.0000 mg | ORAL_TABLET | Freq: Two times a day (BID) | ORAL | Status: AC
  Administered 2020-04-19: 250 mg
  Filled 2020-04-19: qty 1

## 2020-04-19 NOTE — Progress Notes (Signed)
NAME:  Sandra [name], MRN:  [id number], DOB:  [birth year], LOS: 20 ADMISSION DATE:  03/31/2020,   Brief History:  This is a 45 year old female with COVID-19 pneumonia and ARDS  History of Present Illness:  Is a 45 year old unvaccinated white female who presented to the hospital on 03/29/2020 with Covid pneumonia.  Over the next 14 days she has developed Covid ARDS.  She progressed from high flow nasal cannula to BiPAP and then ultimately was intubated on 04/12/2020.  She has been treated with remdesivir steroids and baricitnib.  On 02/14 patient had a small segmental right lower lobe pulmonary embolism and was converted to full dose Lovenox.   Past Medical History:  Anxiety Arthritis Depression Fibromyalgia GERD Gastric ulceration Hiatal hernia POTS Hypertension Morbid obesity  Significant Hospital Events:  Intubation  Consults:  Critical care medicine  Procedures:  04/13/20 intubated 04/13/2020 triple-lumen catheter placed  Significant Diagnostic Tests:    Micro Data:  Covid + 03/29/2020  Antimicrobials:  Levaquin  Subjective  No events. Trial off paralytics resulted in de-recruitment and sats to 60s.  Objective   Blood pressure (!) 111/48, pulse 64, temperature 98.3 F (36.8 C), temperature source Oral, resp. rate (!) 28, height 5\' 4"  (1.626 m), weight 122.2 kg, SpO2 95 %.    Vent Mode: PRVC FiO2 (%):  [70 %-100 %] 100 % Set Rate:  [28 bmp] 28 bmp Vt Set:  [400 mL] 400 mL PEEP:  [16 cmH20] 16 cmH20 Plateau Pressure:  [30 cmH20-37 cmH20] 37 cmH20   Intake/Output Summary (Last 24 hours) at 04/19/2020 0913 Last data filed at 04/19/2020 04/21/2020 Gross per 24 hour  Intake 1507.16 ml  Output 3350 ml  Net -1842.84 ml   Filed Weights   04/16/20 0400 04/17/20 0500 04/18/20 0424  Weight: 117.6 kg 120 kg 122.2 kg    Examination: Constitutional: sedated paralyzed woman in NAD  Eyes: pinpoint Ears, nose, mouth, and throat: ETT in place, minimal secretions, nares  packed Cardiovascular: RRR, ext warm Respiratory: clear apices, diminished bases Gastrointestinal: +BS Skin: No rashes, normal turgor Neurologic: paralyzed Psychiatric: cannot assess  P/F 161 supine DP 19, PEEP 16, Plat 35, unchanged today Diuresed net neg 1.8L  Assessment & Plan:  Acute respiratory failure Covid ARDS COVID- associated pulmonary embolsim Suspected pulmonary edema superimposed Covid pneumonia Morbid obesity Idiopathic intracranial hypertension s/p VP shunt POTS Chronic pain, fibromyalgia Enterococcal UTI- treated with levaquin Hyperglycemia- worse today Constipation- ongoing ABLA from nose bleeding- after proning, required packing x 2 2/22  - ARDSnet PEEP/FiO2, attempt to limit driving pressures as allowed by lung compliance - VAP prevention bundle -16/8h proning with A/a gradients < 150, adjust based on clinical response - Sedation to minimize shearing forces created by ventilator assynchrony: - NMB wean attempt today failed, may have to go down more slowly. Current sedation regimen is fent/versed.  Will add seroquel, klonipin and try to wean down on drips; if we can get her off NMB, could do fent+precedex - Continue packing while proning for now, ceftriaxone for TSS ppx: consider removal 2/25 - Keep event to neg with balanced diuresis: lasix/diamox today bid x 2 doses - Decrease IV steroids: taper as ordered - Continue current insulin regimen given decrease in steroids: increase from 38 levemir to 50, increase TF coverage from 4q4h to 6q4h - Continue bowel regimen - Check AM EKG - Off isolation 2/25: 21 day from +  Best practice (evaluated daily)  Diet: TF pain/Anxiety/Delirium protocol (if indicated): fent/versed VAP protocol (if indicated): in place DVT  prophylaxis: Full dose Lovenox GI prophylaxis: Protonix Glucose control: basal bolus as ordered Mobility: Bedrest Disposition: Intensive care unit  Goals of Care:   Code Status: Full Confirmed on  04/14/20 Nurse present, all measures, GOC next due 2/26 Told her on 2/22 estimated survival chances around 20% with present clinical picture  Patient critically ill due to COVID ARDS Interventions to address this today vent titration Risk of deterioration without these interventions is high  I personally spent 39 minutes providing critical care not including any separately billable procedures  Myrla Halsted MD [facility] Prefer epic messenger for cross cover needs If after hours, please call E-link

## 2020-04-19 NOTE — Progress Notes (Signed)
Wife updated by phone.  Katrinka Blazing

## 2020-04-19 NOTE — Progress Notes (Signed)
Assisted tele visit to patient with daughter.  Tyffany Waldrop R Audryna Wendt, RN   

## 2020-04-19 NOTE — Plan of Care (Signed)

## 2020-04-20 ENCOUNTER — Inpatient Hospital Stay (HOSPITAL_COMMUNITY): Payer: Medicare Other

## 2020-04-20 DIAGNOSIS — J1282 Pneumonia due to coronavirus disease 2019: Secondary | ICD-10-CM | POA: Diagnosis not present

## 2020-04-20 DIAGNOSIS — J9601 Acute respiratory failure with hypoxia: Secondary | ICD-10-CM | POA: Diagnosis not present

## 2020-04-20 DIAGNOSIS — U071 COVID-19: Secondary | ICD-10-CM | POA: Diagnosis not present

## 2020-04-20 LAB — GLUCOSE, CAPILLARY
Glucose-Capillary: 280 mg/dL — ABNORMAL HIGH (ref 70–99)
Glucose-Capillary: 235 mg/dL — ABNORMAL HIGH (ref 70–99)
Glucose-Capillary: 307 mg/dL — ABNORMAL HIGH (ref 70–99)
Glucose-Capillary: 332 mg/dL — ABNORMAL HIGH (ref 70–99)
Glucose-Capillary: 283 mg/dL — ABNORMAL HIGH (ref 70–99)
Glucose-Capillary: 208 mg/dL — ABNORMAL HIGH (ref 70–99)

## 2020-04-20 LAB — CBC WITH DIFFERENTIAL/PLATELET
Abs Immature Granulocytes: 0.09 10*3/uL — ABNORMAL HIGH (ref 0.00–0.07)
Basophils Absolute: 0 10*3/uL (ref 0.0–0.1)
Basophils Relative: 0 %
Eosinophils Absolute: 0 10*3/uL (ref 0.0–0.5)
Eosinophils Relative: 1 %
HCT: 28.5 % — ABNORMAL LOW (ref 36.0–46.0)
Hemoglobin: 8.4 g/dL — ABNORMAL LOW (ref 12.0–15.0)
Immature Granulocytes: 2 %
Lymphocytes Relative: 11 %
Lymphs Abs: 0.5 10*3/uL — ABNORMAL LOW (ref 0.7–4.0)
MCH: 28.5 pg (ref 26.0–34.0)
MCHC: 29.5 g/dL — ABNORMAL LOW (ref 30.0–36.0)
MCV: 96.6 fL (ref 80.0–100.0)
Monocytes Absolute: 0.2 10*3/uL (ref 0.1–1.0)
Monocytes Relative: 3 %
Neutro Abs: 3.9 10*3/uL (ref 1.7–7.7)
Neutrophils Relative %: 83 %
Platelets: 122 10*3/uL — ABNORMAL LOW (ref 150–400)
RBC: 2.95 MIL/uL — ABNORMAL LOW (ref 3.87–5.11)
RDW: 15.7 % — ABNORMAL HIGH (ref 11.5–15.5)
WBC: 4.7 10*3/uL (ref 4.0–10.5)
nRBC: 1.1 % — ABNORMAL HIGH (ref 0.0–0.2)

## 2020-04-20 LAB — POCT I-STAT 7, (LYTES, BLD GAS, ICA,H+H)
Acid-Base Excess: 11 mmol/L — ABNORMAL HIGH (ref 0.0–2.0)
Bicarbonate: 37.1 mmol/L — ABNORMAL HIGH (ref 20.0–28.0)
Calcium, Ion: 1.29 mmol/L (ref 1.15–1.40)
HCT: 25 % — ABNORMAL LOW (ref 36.0–46.0)
Hemoglobin: 8.5 g/dL — ABNORMAL LOW (ref 12.0–15.0)
O2 Saturation: 91 %
Patient temperature: 98.9
Potassium: 3.6 mmol/L (ref 3.5–5.1)
Sodium: 138 mmol/L (ref 135–145)
TCO2: 39 mmol/L — ABNORMAL HIGH (ref 22–32)
pCO2 arterial: 61.8 mmHg — ABNORMAL HIGH (ref 32.0–48.0)
pH, Arterial: 7.388 (ref 7.350–7.450)
pO2, Arterial: 65 mmHg — ABNORMAL LOW (ref 83.0–108.0)

## 2020-04-20 LAB — BASIC METABOLIC PANEL
Anion gap: 11 (ref 5–15)
BUN: 38 mg/dL — ABNORMAL HIGH (ref 6–20)
CO2: 36 mmol/L — ABNORMAL HIGH (ref 22–32)
Calcium: 8.8 mg/dL — ABNORMAL LOW (ref 8.9–10.3)
Chloride: 93 mmol/L — ABNORMAL LOW (ref 98–111)
Creatinine, Ser: 0.77 mg/dL (ref 0.44–1.00)
GFR, Estimated: >60 mL/min (ref >60)
Glucose, Bld: 280 mg/dL — ABNORMAL HIGH (ref 70–99)
Potassium: 3.4 mmol/L — ABNORMAL LOW (ref 3.5–5.1)
Sodium: 140 mmol/L (ref 135–145)

## 2020-04-20 LAB — MAGNESIUM: Magnesium: 2.1 mg/dL (ref 1.7–2.4)

## 2020-04-20 MED ORDER — VECURONIUM BROMIDE 10 MG IV SOLR
10.0000 mg | INTRAVENOUS | Status: DC | PRN
  Administered 2020-04-20 – 2020-04-23 (×28): 10 mg via INTRAVENOUS
  Filled 2020-04-20 (×28): qty 10

## 2020-04-20 MED ORDER — VECURONIUM BROMIDE 10 MG IV SOLR
10.0000 mg | INTRAVENOUS | Status: DC | PRN
  Administered 2020-04-20: 10 mg via INTRAVENOUS
  Filled 2020-04-20 (×2): qty 10

## 2020-04-20 MED ORDER — POTASSIUM CHLORIDE 20 MEQ PO PACK
40.0000 meq | PACK | Freq: Once | ORAL | Status: AC
  Administered 2020-04-20: 40 meq
  Filled 2020-04-20: qty 2

## 2020-04-20 NOTE — Progress Notes (Signed)
eLink Physician-Brief Progress Note Patient Name: Sandra [name] DOB: 27-Oct-1975 MRN: [id number]   Date of Service  04/20/2020  HPI/Events of Note  Patient needs a.m. labs ordered.  eICU Interventions  Labs ordered.        Thomasene Lot Meshell Abdulaziz 04/20/2020, 4:58 AM

## 2020-04-20 NOTE — Progress Notes (Signed)
eLink Physician-Brief Progress Note Patient Name: Sandra [name] DOB: 1975-07-26 MRN: [id number]   Date of Service  04/20/2020  HPI/Events of Note  Ventilator asynchrony - Sedated with Fentanyl IV infusion at 300 mcg/hour and Versed IV infusion at 15 mg/hour.   eICU Interventions  Plan: 1. Increase Vecuronium to 10 mg IV Q 1 hour PRN ventilator asynchrony.     Intervention Category Major Interventions: Respiratory failure - evaluation and management;Other:  Lenell Antu 04/20/2020, 10:03 PM

## 2020-04-20 NOTE — Progress Notes (Signed)
NAME:  Sandra [name], MRN:  [id number], DOB:  [birth year], LOS: 21 ADMISSION DATE:  2020-04-17,   Brief History:  This is a 44 year old female with COVID-19 pneumonia and ARDS  History of Present Illness:  Is a 45 year old unvaccinated white female who presented to the hospital on 04-17-2020 with Covid pneumonia.  Over the next 14 days she has developed Covid ARDS.  She progressed from high flow nasal cannula to BiPAP and then ultimately was intubated on 04/12/2020.  She has been treated with remdesivir steroids and baricitnib.  On 02/14 patient had a small segmental right lower lobe pulmonary embolism and was converted to full dose Lovenox.   Past Medical History:  Anxiety Arthritis Depression Fibromyalgia GERD Gastric ulceration Hiatal hernia POTS Hypertension Morbid obesity  Significant Hospital Events:  Intubation  Consults:  Critical care medicine  Procedures:  04/13/20 intubated 04/13/2020 triple-lumen catheter placed  Significant Diagnostic Tests:    Micro Data:  Covid + 17-Apr-2020  Antimicrobials:  Levaquin  Subjective   [name] intubated on mechanical life support critically ill  Objective   Blood pressure (!) 103/44, pulse 78, temperature 98.6 F (37 C), temperature source Oral, resp. rate (!) 25, height 5\' 4"  (1.626 m), weight 115.8 kg, SpO2 91 %.    Vent Mode: PRVC FiO2 (%):  [60 %-90 %] 60 % Set Rate:  [28 bmp] 28 bmp Vt Set:  [400 mL] 400 mL PEEP:  [16 cmH20] 16 cmH20 Plateau Pressure:  [33 cmH20-35 cmH20] 35 cmH20   Intake/Output Summary (Last 24 hours) at 04/20/2020 04/22/2020 Last data filed at 04/20/2020 0700 Gross per 24 hour  Intake 1331.28 ml  Output 3700 ml  Net -2368.72 ml   Filed Weights   04/17/20 0500 04/18/20 0424 04/20/20 04/22/20  Weight: 120 kg 122.2 kg 115.8 kg    Examination: Constitutional: [name] intubated on mechanical life support critically ill Eyes: Pinpoint sluggish to light Ears, endotracheal tube in place,  nares packed Cardiovascular: Regular rate and rhythm Respiratory: Bilateral mechanically ventilated breath sounds Gastrointestinal: Bowel sounds present Skin: No rash Neurologic: Heavily sedated paralyzed   Assessment & Plan:   Acute respiratory failure requiring intubation and mechanical ventilation Covid ARDS COVID- associated pulmonary embolsim Suspected pulmonary edema superimposed Covid pneumonia Morbid obesity Idiopathic intracranial hypertension s/p VP shunt POTS Chronic pain, fibromyalgia Enterococcal UTI- treated with levaquin Hyperglycemia- worse today Constipation- ongoing ABLA from nose bleeding- after proning, required packing x 2 2/22  Continue mechanical ventilation per ARDS protocol Target TVol 6-8cc/kgIBW Target Plateau Pressure < 30cm H20 Target driving pressure less than 15 cm of water Target PaO2 55-65: titrate PEEP/FiO2 per protocol As long as PaO2 to FiO2 ratio is less than 1:150 position in prone position for 16 hours a day Ventilator associated pneumonia prevention protocol Continue to wean sedation as tolerated. Attempted to come off Nimbex yesterday. Would like to stop Nimbex infusion and use as needed vecuronium if possible. Tapering steroids which is already been ordered Continue Levemir plus tube feed coverage Goal CBG 140-180 Discontinue airborne precautions today Repeat CXR today    Best practice (evaluated daily)  Diet: TF pain/Anxiety/Delirium protocol (if indicated): fent/versed VAP protocol (if indicated): in place DVT prophylaxis: Full dose Lovenox GI prophylaxis: Protonix Glucose control: basal bolus as ordered Mobility: Bedrest Disposition: Intensive care unit  Goals of Care:   Code Status: Full Confirmed on 04/20/2020 I called and spoke with the patient's daughter via phone.  She was thankful for the update.  On 04/20/2020.  This patient is  critically ill with multiple organ system failure; which, requires frequent high  complexity decision making, assessment, support, evaluation, and titration of therapies. This was completed through the application of advanced monitoring technologies and extensive interpretation of multiple databases. During this encounter critical care time was devoted to patient care services described in this note for 32 minutes.    Josephine Igo, DO [facility] 04/20/2020 9:23 AM

## 2020-04-20 NOTE — Progress Notes (Signed)
Cortrak Tube Team Note:  Follow-up regarding consult received to place a Cortrak feeding tube.   B/L Rhinorockets remain in place. Continue to be unable to place Cortrak. Pt does have enteral access via OG tube.   Continue to follow-up as available to evaluate for possible Cortrak placement  Romelle Starcher MS, RDN, LDN, CNSC Registered Dietitian III Clinical Nutrition RD Pager and On-Call Pager Number Located in [place]

## 2020-04-20 NOTE — Progress Notes (Signed)
Pharmacy note: electrolyte replacement -K= 3.4 -SCr= 0.77  Plan -Potassium chloride po x1 -BMET in am  Harland German, PharmD Clinical Pharmacist **Pharmacist phone directory can now be found on [email or web address] ([email or web address]).  Listed under [facility].

## 2020-04-21 ENCOUNTER — Inpatient Hospital Stay (HOSPITAL_COMMUNITY): Payer: Medicare Other

## 2020-04-21 DIAGNOSIS — U071 COVID-19: Secondary | ICD-10-CM | POA: Diagnosis not present

## 2020-04-21 DIAGNOSIS — J069 Acute upper respiratory infection, unspecified: Secondary | ICD-10-CM | POA: Diagnosis not present

## 2020-04-21 DIAGNOSIS — J1282 Pneumonia due to coronavirus disease 2019: Secondary | ICD-10-CM | POA: Diagnosis not present

## 2020-04-21 LAB — GLUCOSE, CAPILLARY
Glucose-Capillary: 206 mg/dL — ABNORMAL HIGH (ref 70–99)
Glucose-Capillary: 218 mg/dL — ABNORMAL HIGH (ref 70–99)
Glucose-Capillary: 226 mg/dL — ABNORMAL HIGH (ref 70–99)
Glucose-Capillary: 255 mg/dL — ABNORMAL HIGH (ref 70–99)
Glucose-Capillary: 316 mg/dL — ABNORMAL HIGH (ref 70–99)
Glucose-Capillary: 385 mg/dL — ABNORMAL HIGH (ref 70–99)

## 2020-04-21 LAB — CBC WITH DIFFERENTIAL/PLATELET
Abs Immature Granulocytes: 0.32 10*3/uL — ABNORMAL HIGH (ref 0.00–0.07)
Basophils Absolute: 0 10*3/uL (ref 0.0–0.1)
Basophils Relative: 1 %
Eosinophils Absolute: 0.1 10*3/uL (ref 0.0–0.5)
Eosinophils Relative: 1 %
HCT: 27.7 % — ABNORMAL LOW (ref 36.0–46.0)
Hemoglobin: 8.4 g/dL — ABNORMAL LOW (ref 12.0–15.0)
Immature Granulocytes: 6 %
Lymphocytes Relative: 10 %
Lymphs Abs: 0.6 10*3/uL — ABNORMAL LOW (ref 0.7–4.0)
MCH: 29.5 pg (ref 26.0–34.0)
MCHC: 30.3 g/dL (ref 30.0–36.0)
MCV: 97.2 fL (ref 80.0–100.0)
Monocytes Absolute: 0.2 10*3/uL (ref 0.1–1.0)
Monocytes Relative: 4 %
Neutro Abs: 4.3 10*3/uL (ref 1.7–7.7)
Neutrophils Relative %: 78 %
Platelets: 128 10*3/uL — ABNORMAL LOW (ref 150–400)
RBC: 2.85 MIL/uL — ABNORMAL LOW (ref 3.87–5.11)
RDW: 16.2 % — ABNORMAL HIGH (ref 11.5–15.5)
WBC: 5.5 10*3/uL (ref 4.0–10.5)
nRBC: 1.8 % — ABNORMAL HIGH (ref 0.0–0.2)

## 2020-04-21 LAB — BASIC METABOLIC PANEL
Anion gap: 7 (ref 5–15)
BUN: 37 mg/dL — ABNORMAL HIGH (ref 6–20)
CO2: 35 mmol/L — ABNORMAL HIGH (ref 22–32)
Calcium: 9 mg/dL (ref 8.9–10.3)
Chloride: 101 mmol/L (ref 98–111)
Creatinine, Ser: 0.75 mg/dL (ref 0.44–1.00)
GFR, Estimated: >60 mL/min (ref >60)
Glucose, Bld: 237 mg/dL — ABNORMAL HIGH (ref 70–99)
Potassium: 4 mmol/L (ref 3.5–5.1)
Sodium: 143 mmol/L (ref 135–145)

## 2020-04-21 LAB — MAGNESIUM: Magnesium: 2.3 mg/dL (ref 1.7–2.4)

## 2020-04-21 MED ORDER — CLONAZEPAM 1 MG PO TABS
2.0000 mg | ORAL_TABLET | Freq: Three times a day (TID) | ORAL | Status: DC
  Administered 2020-04-21 – 2020-04-26 (×16): 2 mg
  Filled 2020-04-21 (×4): qty 2
  Filled 2020-04-21: qty 4
  Filled 2020-04-21 (×11): qty 2

## 2020-04-21 MED ORDER — SODIUM CHLORIDE 0.9 % IV SOLN
0.5000 mg/h | INTRAVENOUS | Status: DC
  Administered 2020-04-21: 4 mg/h via INTRAVENOUS
  Administered 2020-04-21: 2 mg/h via INTRAVENOUS
  Administered 2020-04-22: 5 mg/h via INTRAVENOUS
  Administered 2020-04-22 – 2020-04-23 (×3): 8 mg/h via INTRAVENOUS
  Administered 2020-04-23 (×2): 7 mg/h via INTRAVENOUS
  Administered 2020-04-23 – 2020-04-27 (×13): 8 mg/h via INTRAVENOUS
  Filled 2020-04-21 (×32): qty 5

## 2020-04-21 MED ORDER — HYDROMORPHONE HCL 2 MG PO TABS
4.0000 mg | ORAL_TABLET | Freq: Four times a day (QID) | ORAL | Status: DC
  Administered 2020-04-21 – 2020-04-27 (×21): 4 mg
  Filled 2020-04-21 (×21): qty 2

## 2020-04-21 MED ORDER — HYDROMORPHONE BOLUS VIA INFUSION
4.0000 mg | INTRAVENOUS | Status: DC | PRN
  Administered 2020-04-21 – 2020-04-23 (×15): 4 mg via INTRAVENOUS
  Administered 2020-04-26 (×3): 2 mg via INTRAVENOUS
  Filled 2020-04-21: qty 4

## 2020-04-21 MED ORDER — HYDROMORPHONE HCL 2 MG PO TABS
4.0000 mg | ORAL_TABLET | Freq: Four times a day (QID) | ORAL | Status: DC
  Administered 2020-04-21 (×2): 4 mg via ORAL
  Filled 2020-04-21 (×2): qty 2

## 2020-04-21 NOTE — Progress Notes (Signed)
NAME:  Sandra [name], MRN:  [id number], DOB:  1975-05-12, LOS: 22 ADMISSION DATE:  04/21/2020,   Brief History:  This is a 45 year old female with COVID-19 pneumonia and ARDS  History of Present Illness:  Is a 45 year old unvaccinated white female who presented to the hospital on 04/02/2020 with Covid pneumonia.  Over the next 14 days she has developed Covid ARDS.  She progressed from high flow nasal cannula to BiPAP and then ultimately was intubated on 04/12/2020.  She has been treated with remdesivir steroids and baricitnib.  On 02/14 patient had a small segmental right lower lobe pulmonary embolism and was converted to full dose Lovenox.  Past Medical History:  Anxiety Arthritis Depression Fibromyalgia GERD Gastric ulceration Hiatal hernia POTS Hypertension Morbid obesity  Significant Hospital Events:  Intubation  Consults:  Critical care medicine  Procedures:  04/13/20 intubated 04/13/2020 triple-lumen catheter placed  Significant Diagnostic Tests:    Micro Data:  Covid + 03/27/2020  Antimicrobials:  Levaquin  Subjective   Critically ill, intubated on life support   Objective   Blood pressure (!) 108/53, pulse (!) 101, temperature (!) 100.7 F (38.2 C), temperature source Axillary, resp. rate (!) 28, height 5\' 4"  (1.626 m), weight 115.2 kg, SpO2 92 %.    Vent Mode: PRVC FiO2 (%):  [60 %-80 %] 80 % Set Rate:  [28 bmp] 28 bmp Vt Set:  [400 mL] 400 mL PEEP:  [16 cmH20] 16 cmH20 Plateau Pressure:  [29 cmH20-36 cmH20] 29 cmH20   Intake/Output Summary (Last 24 hours) at 04/21/2020 04/23/2020 Last data filed at 04/21/2020 0700 Gross per 24 hour  Intake 1347.94 ml  Output 2580 ml  Net -1232.06 ml   Filed Weights   04/18/20 0424 04/20/20 0605 04/21/20 0400  Weight: 122.2 kg 115.8 kg 115.2 kg    Examination: Constitutional: younf fm, intuabted on life support  Eyes: pinpoint pupils, NCAT, Mouth: ETT in place  Cardiovascular: RRR, s1 s2 Respiratory: BL  ,mechanically vented breaths  Gastrointestinal: bowel sounds present  Skin: no rash  Neurologic: heavily sedated    Assessment & Plan:   Acute respiratory failure requiring intubation and mechanical ventilation Covid ARDS COVID- associated pulmonary embolsim Suspected pulmonary edema superimposed Covid pneumonia Morbid obesity Idiopathic intracranial hypertension s/p VP shunt POTS Chronic pain, fibromyalgia Enterococcal UTI- treated with levaquin Hyperglycemia- worse today Constipation- ongoing ABLA from nose bleeding- after proning, required packing x 2 2/22  Continue mechanical ventilation per ARDS protocol Target TVol 6-8cc/kgIBW Target Plateau Pressure < 30cm H20 Target driving pressure less than 15 cm of water Target PaO2 55-65: titrate PEEP/FiO2 per protocol Ventilator associated pneumonia prevention protocol Prn vecuronium  increased oral regimen for meds  Biggest goal is to decrease IV sedation needs  CBGs + SSI, goal cbgs 140-160 CXR in AM Monitor fevers at this time. Remains on ceftriaxone  Discussed regimen with pharmacy    Best practice (evaluated daily)  Diet: TF pain/Anxiety/Delirium protocol (if indicated): fent/versed VAP protocol (if indicated): in place DVT prophylaxis: Full dose Lovenox GI prophylaxis: Protonix Glucose control: basal bolus as ordered Mobility: Bedrest Disposition: Intensive care unit  Goals of Care:   Code Status: Full Confirmed on 04/20/2020 I called and spoke with the patient's daughter via phone.  She was thankful for the update.  On 04/20/2020.  This patient is critically ill with multiple organ system failure; which, requires frequent high complexity decision making, assessment, support, evaluation, and titration of therapies. This was completed through the application of advanced monitoring technologies and extensive  interpretation of multiple databases. During this encounter critical care time was devoted to patient care  services described in this note for 32 minutes.  Josephine Igo, DO [facility] 04/21/2020 8:11 AM

## 2020-04-21 NOTE — Plan of Care (Signed)

## 2020-04-22 ENCOUNTER — Inpatient Hospital Stay (HOSPITAL_COMMUNITY): Payer: Medicare Other

## 2020-04-22 DIAGNOSIS — U071 COVID-19: Secondary | ICD-10-CM | POA: Diagnosis not present

## 2020-04-22 DIAGNOSIS — J9601 Acute respiratory failure with hypoxia: Secondary | ICD-10-CM | POA: Diagnosis not present

## 2020-04-22 LAB — COMPREHENSIVE METABOLIC PANEL
ALT: 35 U/L (ref 0–44)
AST: 20 U/L (ref 15–41)
Albumin: 2.5 g/dL — ABNORMAL LOW (ref 3.5–5.0)
Alkaline Phosphatase: 85 U/L (ref 38–126)
Anion gap: 7 (ref 5–15)
BUN: 49 mg/dL — ABNORMAL HIGH (ref 6–20)
CO2: 35 mmol/L — ABNORMAL HIGH (ref 22–32)
Calcium: 8.5 mg/dL — ABNORMAL LOW (ref 8.9–10.3)
Chloride: 103 mmol/L (ref 98–111)
Creatinine, Ser: 0.93 mg/dL (ref 0.44–1.00)
GFR, Estimated: >60 mL/min (ref >60)
Glucose, Bld: 388 mg/dL — ABNORMAL HIGH (ref 70–99)
Potassium: 4.9 mmol/L (ref 3.5–5.1)
Sodium: 145 mmol/L (ref 135–145)
Total Bilirubin: 0.2 mg/dL — ABNORMAL LOW (ref 0.3–1.2)
Total Protein: 5.6 g/dL — ABNORMAL LOW (ref 6.5–8.1)

## 2020-04-22 LAB — CBC WITH DIFFERENTIAL/PLATELET
Abs Immature Granulocytes: 0 10*3/uL (ref 0.00–0.07)
Basophils Absolute: 0 10*3/uL (ref 0.0–0.1)
Basophils Relative: 0 %
Eosinophils Absolute: 0.1 10*3/uL (ref 0.0–0.5)
Eosinophils Relative: 2 %
HCT: 26.6 % — ABNORMAL LOW (ref 36.0–46.0)
Hemoglobin: 7.7 g/dL — ABNORMAL LOW (ref 12.0–15.0)
Lymphocytes Relative: 16 %
Lymphs Abs: 0.8 10*3/uL (ref 0.7–4.0)
MCH: 28.8 pg (ref 26.0–34.0)
MCHC: 28.9 g/dL — ABNORMAL LOW (ref 30.0–36.0)
MCV: 99.6 fL (ref 80.0–100.0)
Monocytes Absolute: 0.1 10*3/uL (ref 0.1–1.0)
Monocytes Relative: 2 %
Neutro Abs: 3.8 10*3/uL (ref 1.7–7.7)
Neutrophils Relative %: 80 %
Platelets: 126 10*3/uL — ABNORMAL LOW (ref 150–400)
RBC: 2.67 MIL/uL — ABNORMAL LOW (ref 3.87–5.11)
RDW: 16.5 % — ABNORMAL HIGH (ref 11.5–15.5)
WBC: 4.7 10*3/uL (ref 4.0–10.5)
nRBC: 12 /100 WBC — ABNORMAL HIGH
nRBC: 4.2 % — ABNORMAL HIGH (ref 0.0–0.2)

## 2020-04-22 LAB — BASIC METABOLIC PANEL
Anion gap: 8 (ref 5–15)
BUN: 35 mg/dL — ABNORMAL HIGH (ref 6–20)
CO2: 35 mmol/L — ABNORMAL HIGH (ref 22–32)
Calcium: 8.8 mg/dL — ABNORMAL LOW (ref 8.9–10.3)
Chloride: 106 mmol/L (ref 98–111)
Creatinine, Ser: 0.82 mg/dL (ref 0.44–1.00)
GFR, Estimated: >60 mL/min (ref >60)
Glucose, Bld: 230 mg/dL — ABNORMAL HIGH (ref 70–99)
Potassium: 4.1 mmol/L (ref 3.5–5.1)
Sodium: 149 mmol/L — ABNORMAL HIGH (ref 135–145)

## 2020-04-22 LAB — POCT I-STAT 7, (LYTES, BLD GAS, ICA,H+H)
Acid-Base Excess: 14 mmol/L — ABNORMAL HIGH (ref 0.0–2.0)
Bicarbonate: 40.1 mmol/L — ABNORMAL HIGH (ref 20.0–28.0)
Calcium, Ion: 1.26 mmol/L (ref 1.15–1.40)
HCT: 23 % — ABNORMAL LOW (ref 36.0–46.0)
Hemoglobin: 7.8 g/dL — ABNORMAL LOW (ref 12.0–15.0)
O2 Saturation: 94 %
Patient temperature: 101.1
Potassium: 4 mmol/L (ref 3.5–5.1)
Sodium: 146 mmol/L — ABNORMAL HIGH (ref 135–145)
TCO2: 42 mmol/L — ABNORMAL HIGH (ref 22–32)
pCO2 arterial: 69.9 mmHg (ref 32.0–48.0)
pH, Arterial: 7.373 (ref 7.350–7.450)
pO2, Arterial: 82 mmHg — ABNORMAL LOW (ref 83.0–108.0)

## 2020-04-22 LAB — GLUCOSE, CAPILLARY
Glucose-Capillary: 213 mg/dL — ABNORMAL HIGH (ref 70–99)
Glucose-Capillary: 237 mg/dL — ABNORMAL HIGH (ref 70–99)
Glucose-Capillary: 190 mg/dL — ABNORMAL HIGH (ref 70–99)
Glucose-Capillary: 336 mg/dL — ABNORMAL HIGH (ref 70–99)
Glucose-Capillary: 344 mg/dL — ABNORMAL HIGH (ref 70–99)
Glucose-Capillary: 282 mg/dL — ABNORMAL HIGH (ref 70–99)

## 2020-04-22 LAB — PROTIME-INR
INR: 1.1 (ref 0.8–1.2)
Prothrombin Time: 13.7 seconds (ref 11.4–15.2)

## 2020-04-22 LAB — APTT: aPTT: 34 seconds (ref 24–36)

## 2020-04-22 LAB — HEPARIN ANTI-XA: Heparin LMW: 1.26 IU/mL

## 2020-04-22 LAB — FIBRINOGEN: Fibrinogen: 663 mg/dL — ABNORMAL HIGH (ref 210–475)

## 2020-04-22 MED ORDER — SODIUM CHLORIDE 0.9% FLUSH: 10.0000 mL | INTRAVENOUS | Status: DC | PRN

## 2020-04-22 MED ORDER — TRANEXAMIC ACID FOR EPISTAXIS
500.0000 mg | Freq: Once | TOPICAL | Status: AC
  Filled 2020-04-22: qty 10

## 2020-04-22 MED ORDER — PROPOFOL 1000 MG/100ML IV EMUL
5.0000 ug/kg/min | INTRAVENOUS | Status: DC
  Administered 2020-04-22: 35 ug/kg/min via INTRAVENOUS
  Administered 2020-04-22: 8 ug/kg/min via INTRAVENOUS
  Administered 2020-04-23: 45 ug/kg/min via INTRAVENOUS
  Administered 2020-04-23: 35 ug/kg/min via INTRAVENOUS
  Administered 2020-04-23: 80 ug/kg/min via INTRAVENOUS
  Administered 2020-04-23: 70 ug/kg/min via INTRAVENOUS
  Administered 2020-04-23 (×6): 80 ug/kg/min via INTRAVENOUS
  Administered 2020-04-23: 60 ug/kg/min via INTRAVENOUS
  Administered 2020-04-24 (×3): 40 ug/kg/min via INTRAVENOUS
  Administered 2020-04-24: 50 ug/kg/min via INTRAVENOUS
  Administered 2020-04-24: 40 ug/kg/min via INTRAVENOUS
  Administered 2020-04-24: 60 ug/kg/min via INTRAVENOUS
  Administered 2020-04-24 – 2020-04-25 (×8): 40 ug/kg/min via INTRAVENOUS
  Administered 2020-04-25: 50 ug/kg/min via INTRAVENOUS
  Administered 2020-04-26: 30 ug/kg/min via INTRAVENOUS
  Administered 2020-04-26: 40 ug/kg/min via INTRAVENOUS
  Administered 2020-04-26: 60 ug/kg/min via INTRAVENOUS
  Administered 2020-04-26 (×3): 50 ug/kg/min via INTRAVENOUS
  Administered 2020-04-26: 30 ug/kg/min via INTRAVENOUS
  Administered 2020-04-26: 70 ug/kg/min via INTRAVENOUS
  Administered 2020-04-27 (×2): 30 ug/kg/min via INTRAVENOUS
  Filled 2020-04-22 (×6): qty 100
  Filled 2020-04-22: qty 200
  Filled 2020-04-22 (×27): qty 100

## 2020-04-22 MED ORDER — SODIUM CHLORIDE 0.9% FLUSH
10.0000 mL | Freq: Two times a day (BID) | INTRAVENOUS | Status: DC
  Administered 2020-04-22 – 2020-04-26 (×10): 10 mL

## 2020-04-22 MED ORDER — VANCOMYCIN HCL 2000 MG/400ML IV SOLN
2000.0000 mg | Freq: Once | INTRAVENOUS | Status: AC
  Administered 2020-04-22: 2000 mg via INTRAVENOUS
  Filled 2020-04-22: qty 400

## 2020-04-22 MED ORDER — SODIUM CHLORIDE 0.9 % IV SOLN
2.0000 g | Freq: Three times a day (TID) | INTRAVENOUS | Status: DC
  Administered 2020-04-22 – 2020-04-24 (×6): 2 g via INTRAVENOUS
  Filled 2020-04-22 (×6): qty 2

## 2020-04-22 MED ORDER — SODIUM CHLORIDE 0.9 % IV SOLN
INTRAVENOUS | Status: DC | PRN
  Administered 2020-04-22 – 2020-04-24 (×2): 500 mL via INTRAVENOUS

## 2020-04-22 MED ORDER — VANCOMYCIN HCL 1000 MG/200ML IV SOLN
1000.0000 mg | Freq: Three times a day (TID) | INTRAVENOUS | Status: DC
  Administered 2020-04-23 – 2020-04-24 (×5): 1000 mg via INTRAVENOUS
  Filled 2020-04-22 (×5): qty 200

## 2020-04-22 MED ORDER — TRANEXAMIC ACID FOR EPISTAXIS
500.0000 mg | Freq: Once | TOPICAL | Status: AC
  Administered 2020-04-22: 500 mg via TOPICAL
  Filled 2020-04-22: qty 10

## 2020-04-22 MED ORDER — NYSTATIN 100000 UNIT/ML MT SUSP
5.0000 mL | Freq: Four times a day (QID) | OROMUCOSAL | Status: DC
  Administered 2020-04-22 – 2020-04-26 (×17): 500000 [IU] via OROMUCOSAL
  Filled 2020-04-22 (×16): qty 5

## 2020-04-22 NOTE — Progress Notes (Signed)
PCCM Brief Progress Note  45 yo COVID-19 ARDS on MV, PE, ABLA who has on mechanical ventilation >1 week.   Today, has had numerous dyssynchronous events despite BIS <60, causing significant hypoxemia with SpO2 50-80, with transient improvement after PRN NMB administration.   Assessed pt in follow up:  -UOP has decreased today, latest Cr not c/w AKI but worried that renal function may be declining.   -PRVC 100% FiO2 RR 28 Vt 400 PEEP 16  On 10 mg/hr midaz gtt + boluses, 4mg /hr dilaudid gtt + boluses.  On arrival pt synchronous, SpO2 94%. Discussed incr dilaudid gtt to ceiling dose with RN  -CXR reviewed, with interval worsening of BLE opacities since CXR earlier today.   -While reviewing, pt desatt'ed to 80% and became very dyssynchronous, with high ppressures.  BIS <60 when this occurred.    Acute hypoxemic respiratory failure due to COVID-19 ARDS Possible superimposed PNA Small bilateral pleural effusions At risk for AKI P Increase dilaudid to ceiling dose Adding prop  Cont PRN NMB -- possible that gtt may be needed No real room for vent changes unfortunately D/w pharm -- with worsening opacities and fever, broaden abx with concern for superimposed PNA Trend UOP, renal indices -- will order 1700 labs   Pts daughter arrived at bedside-- CXRs reviewed, discussed worsening respiratory status and decreasing UOP Discussed above interventions, but also my concern about this clinical trend.  We discussed concern that pt may not improve, and briefly spoke about possibility of revisiting GOC if no improvements or if declines from here. Emotional support provided by myself and pts RN   CCT: 48 minutes  MSN, AGACNP-BC [facility] If no answer, [phone] 04/22/2020, 3:07 PM

## 2020-04-22 NOTE — Progress Notes (Signed)
At approximately 1110 patient became dysynchronous on the vent and sats dropped to the low 50's high 60's after a lab draw for blood cultures. Dr. Tonia Brooms and RT paged, FiO2 changed to 100%, paralyticgiven as well as Versed and dilaudid boluses. Sats recovered to low 90's at 1130.

## 2020-04-22 NOTE — Progress Notes (Signed)
NAME:  Sandra [name], MRN:  [id number], DOB:  07/27/1975, LOS: 23 ADMISSION DATE:  04/09/2020,   Brief History:  This is a 45 year old female with COVID-19 pneumonia and ARDS  History of Present Illness:  Is a 45 year old unvaccinated white female who presented to the hospital on 04/20/2020 with Covid pneumonia.  Over the next 14 days she has developed Covid ARDS.  She progressed from high flow nasal cannula to BiPAP and then ultimately was intubated on 04/12/2020.  She has been treated with remdesivir steroids and baricitnib.  On 02/14 patient had a small segmental right lower lobe pulmonary embolism and was converted to full dose Lovenox.  Past Medical History:  Anxiety Arthritis Depression Fibromyalgia GERD Gastric ulceration Hiatal hernia POTS Hypertension Morbid obesity  Significant Hospital Events:  Intubation  Consults:  Critical care medicine  Procedures:  04/13/20 intubated 04/13/2020 triple-lumen catheter placed  Significant Diagnostic Tests:    Micro Data:  Covid + 04/16/2020  Antimicrobials:  Levaquin  Subjective  Patient remains critically ill intubated on mechanical life support.  Issues with nosebleed again last night.  Additional gauze placed.  Lovenox held this morning.  Objective   Blood pressure (!) 108/51, pulse 91, temperature (!) 100.6 F (38.1 C), temperature source Axillary, resp. rate (!) 28, height 5\' 4"  (1.626 m), weight 116.1 kg, SpO2 95 %.    Vent Mode: PRVC FiO2 (%):  [80 %-90 %] 90 % Set Rate:  [28 bmp] 28 bmp Vt Set:  [400 mL] 400 mL PEEP:  [16 cmH20] 16 cmH20 Plateau Pressure:  [25 cmH20-32 cmH20] 30 cmH20   Intake/Output Summary (Last 24 hours) at 04/22/2020 0834 Last data filed at 04/22/2020 0756 Gross per 24 hour  Intake 2533.85 ml  Output 3225 ml  Net -691.15 ml   Filed Weights   04/20/20 0605 04/21/20 0400 04/22/20 0434  Weight: 115.8 kg 115.2 kg 116.1 kg    Examination: Constitutional: Young female intubated on  mechanical life support Eyes: NCAT, pinpoint pupils Mouth: Endotracheal tube in place Cardiovascular: Regular rate rhythm, S1-S2 Respiratory: Bilateral mechanically ventilated breath sounds Gastrointestinal: Bowel sounds present Skin: No rash Neurologic: Sedated   Assessment & Plan:   Acute respiratory failure requiring intubation and mechanical ventilation Covid ARDS COVID- associated pulmonary embolsim Suspected pulmonary edema superimposed Covid pneumonia Morbid obesity Idiopathic intracranial hypertension s/p VP shunt POTS Chronic pain, fibromyalgia Enterococcal UTI- treated with levaquin Hyperglycemia- worse today Constipation- ongoing ABLA from nose bleeding- after proning, required packing x 2 2/22  Continue mechanical ventilation per ARDS protocol Target TVol 6-8cc/kgIBW Target Plateau Pressure < 30cm H20 Target driving pressure less than 15 cm of water Target PaO2 55-65: titrate PEEP/FiO2 per protocol As long as PaO2 to FiO2 ratio is less than 1:150 position in prone position for 16 hours a day Ventilator associated pneumonia prevention protocol As needed vecuronium Continue working on decreasing IV sedation Drop upper limit threshold for Versed infusion to 10 mg CBGs plus SSI, goal CBG 140-180 Chest x-ray as needed Continue ceftriaxone TXA solution dripped into the bilateral nares to help control bleeding from nose. Continue Afrin May need to consider pulling out existing packing and replacing. Hold Lovenox dose this morning.   Best practice (evaluated daily)  Diet: TF pain/Anxiety/Delirium protocol (if indicated): fent/versed VAP protocol (if indicated): in place DVT prophylaxis: Full dose Lovenox GI prophylaxis: Protonix Glucose control: basal bolus as ordered Mobility: Bedrest Disposition: Intensive care unit  Goals of Care:   Code Status: Full Confirmed on 04/20/2020 I called and spoke  with the patient's daughter via phone.  She was thankful for  the update.  On 04/20/2020.  This patient is critically ill with multiple organ system failure; which, requires frequent high complexity decision making, assessment, support, evaluation, and titration of therapies. This was completed through the application of advanced monitoring technologies and extensive interpretation of multiple databases. During this encounter critical care time was devoted to patient care services described in this note for 32 minutes.  Josephine Igo, DO [facility] 04/22/2020 8:34 AM

## 2020-04-22 NOTE — Progress Notes (Signed)
ANTICOAGULATION CONSULT NOTE - Follow Up Consult  Pharmacy Consult for enoxaprin Indication: pulmonary embolus  Allergies  Allergen Reactions  . Ambien [Zolpidem Tartrate] Rash and Other (See Comments)    Pt states she cannot walk after taking this medication  . Penicillins Hives and Swelling    Has patient had a PCN reaction causing immediate rash, facial/tongue/throat swelling, SOB or lightheadedness with hypotension: Yes Has patient had a PCN reaction causing severe rash involving mucus membranes or skin necrosis: Yes Has patient had a PCN reaction occurring within the last 10 years: Yes If all of the above answers are "NO", then may proceed with Cephalosporin use. Pt had ceftriaxone and ceftazidime 01/2014 and 2019  . Topamax [Topiramate] Other (See Comments)    Gives her kidney stones  . Hydrocodone Itching  . Chlorhexidine Gluconate Itching and Rash    "It felt bees were stinging me"  . Latex Rash  . Versed [Midazolam] Itching    Patient Measurements: Height: 5\' 4"  (162.6 cm) Weight: 116.1 kg (255 lb 15.3 oz) IBW/kg (Calculated) : 54.7  Vital Signs: Temp: 102.2 F (39 C) (02/27 1200) Temp Source: Axillary (02/27 1200) BP: 107/57 (02/27 1500) Pulse Rate: 115 (02/27 1500)  Labs: Recent Labs    04/20/20 0532 04/21/20 0323 04/22/20 0121 04/22/20 0410 04/22/20 1027  HGB 8.4* 8.4* 7.7* 7.8*  --   HCT 28.5* 27.7* 26.6* 23.0*  --   PLT 122* 128* 126*  --   --   APTT  --   --  34  --   --   LABPROT  --   --  13.7  --   --   INR  --   --  1.1  --   --   HEPRLOWMOCWT  --   --   --   --  1.26  CREATININE 0.77 0.75 0.82  --   --     Estimated Creatinine Clearance: 108.5 mL/min (by C-G formula based on SCr of 0.82 mg/dL).   Medical History: Past Medical History:  Diagnosis Date  . Anxiety   . Arthritis   . Chronic back pain   . Chronic migraine   . Constipation   . Depression   . Diabetes mellitus (HCC)    A1C 6.6% on 04/13/20  . Fibromyalgia   . GERD  (gastroesophageal reflux disease)   . History of bronchitis   . History of cardiac catheterization    Normal coronaries 2007  . History of gastric ulcer   . History of hiatal hernia   . History of pneumonia   . Hypertension   . Idiopathic intracranial hypertension   . POTS (postural orthostatic tachycardia syndrome)   . Seasonal allergies     Assessment: 44yof admitted with COVID pneumonia.  She has small PE on Chest CT and has been on enoxaparin 120mg  q12h wt 115kg Cr 1 crcl >58ml/min H/h has been stable pltc stable  Patient has nasal packing for nose bleeds Checked Anti Xa level 12 hr after enoxaparin dose today level 1.2 which shows patient has been accumulating enoxaparin.   Goal of Therapy:  Anti-Xa level 0.6-1 units/ml 4hrs after LMWH dose given Monitor platelets by anticoagulation protocol: Yes   Plan:  Hold enoxaparin for now  Recheck Heparin level with am labs and reassess enoxaparin dosing  Monitor CBC and bmet    Pharm.D. CPP, BCPS Clinical Pharmacist [phone] 04/22/2020 3:23 PM

## 2020-04-22 NOTE — Progress Notes (Signed)
eLink Physician-Brief Progress Note Patient Name: Sandra [name] DOB: [birth year] MRN: [id number]   Date of Service  04/22/2020  HPI/Events of Note  ABG on 90%/PRVC 28/TV 400/P 5 = 7.373/69.9/82  eICU Interventions  Continue present ventilator management.      Intervention Category Major Interventions: Respiratory failure - evaluation and management  [name] 04/22/2020, 4:39 AM

## 2020-04-22 NOTE — Progress Notes (Signed)
Pharmacy Antibiotic Note  Sandra [name] is a 45 y.o. female admitted on 04-03-2020 with Covid pneumonia.  She started having fevers late yesterday Tm 102, wbc remains wnl,  And Cxr with possible pna.  Cr 1 crcl 117ml/min.  Will draw Bcx and sputum cx and broaden abx coverage.   Pharmacy has been consulted for vancomycin and cefepime dosing.  Plan: cefepime 2gm IV q8h Vancomycin 2gm IV x1 and then 1gm IV q8hr   Height: 5\' 4"  (162.6 cm) Weight: 116.1 kg (255 lb 15.3 oz) IBW/kg (Calculated) : 54.7  Temp (24hrs), Avg:100.9 F (38.3 C), Min:99.2 F (37.3 C), Max:102.2 F (39 C)  Recent Labs  Lab 04/18/20 0435 04/19/20 0350 04/20/20 0532 04/21/20 0323 04/22/20 0121  WBC 5.9 6.7 4.7 5.5 4.7  CREATININE 0.72 0.76 0.77 0.75 0.82    Estimated Creatinine Clearance: 108.5 mL/min (by C-G formula based on SCr of 0.82 mg/dL).    Allergies  Allergen Reactions  . Ambien [Zolpidem Tartrate] Rash and Other (See Comments)    Pt states she cannot walk after taking this medication  . Penicillins Hives and Swelling    Has patient had a PCN reaction causing immediate rash, facial/tongue/throat swelling, SOB or lightheadedness with hypotension: Yes Has patient had a PCN reaction causing severe rash involving mucus membranes or skin necrosis: Yes Has patient had a PCN reaction occurring within the last 10 years: Yes If all of the above answers are "NO", then may proceed with Cephalosporin use. Pt had ceftriaxone and ceftazidime 01/2014 and 2019  . Topamax [Topiramate] Other (See Comments)    Gives her kidney stones  . Hydrocodone Itching  . Chlorhexidine Gluconate Itching and Rash    "It felt bees were stinging me"  . Latex Rash  . Versed [Midazolam] Itching    Antimicrobials this admission:  Remdesivir 2/4 >>2/8 Decadron 2/4 > solumedrol >> Baricitinib 2/4>>2/18 Levaquin 2/17> 2/21 CTX 2/23>>2/27 Vanc 2/27> Cefepime 2/27>  Dose adjustments this admission:   Microbiology  results: 2/4 BCX: ng 2/15 UCX: e. Faecalis > 100K CFU/ml pan sensitive 2/15 BCx x 2 > neg 2/27 Bcx 2/27 Sputum cx    3/27 Pharm.D. CPP, BCPS Clinical Pharmacist [phone] 04/22/2020 3:29 PM

## 2020-04-22 NOTE — Progress Notes (Addendum)
eLink Physician-Brief Progress Note Patient Name: Sandra [name] DOB: 08/12/1975 MRN: [id number]   Date of Service  04/22/2020  HPI/Events of Note  Bleeding from nose in spite of nasal packing. Patient is on full dose Lovenox for sub segmental PE.Marland Kitchen   eICU Interventions  Plan: 1. CBC with platelets, PT/INR and PTT now. 2. Will notify ground team of events and nursing request to change packing. 3. Hold Lovenox in AM until patient is evaluated by rounding team.      Intervention Category Major Interventions: Other:  Lenell Antu 04/22/2020, 12:58 AM

## 2020-04-23 ENCOUNTER — Inpatient Hospital Stay (HOSPITAL_COMMUNITY): Payer: Medicare Other

## 2020-04-23 DIAGNOSIS — J9601 Acute respiratory failure with hypoxia: Secondary | ICD-10-CM | POA: Diagnosis not present

## 2020-04-23 DIAGNOSIS — Z7189 Other specified counseling: Secondary | ICD-10-CM | POA: Diagnosis not present

## 2020-04-23 DIAGNOSIS — J80 Acute respiratory distress syndrome: Secondary | ICD-10-CM | POA: Diagnosis not present

## 2020-04-23 DIAGNOSIS — U071 COVID-19: Secondary | ICD-10-CM | POA: Diagnosis not present

## 2020-04-23 DIAGNOSIS — Z515 Encounter for palliative care: Secondary | ICD-10-CM

## 2020-04-23 DIAGNOSIS — Z9911 Dependence on respirator [ventilator] status: Secondary | ICD-10-CM | POA: Diagnosis not present

## 2020-04-23 DIAGNOSIS — J1282 Pneumonia due to coronavirus disease 2019: Secondary | ICD-10-CM | POA: Diagnosis not present

## 2020-04-23 LAB — GLUCOSE, CAPILLARY
Glucose-Capillary: 179 mg/dL — ABNORMAL HIGH (ref 70–99)
Glucose-Capillary: 200 mg/dL — ABNORMAL HIGH (ref 70–99)
Glucose-Capillary: 204 mg/dL — ABNORMAL HIGH (ref 70–99)
Glucose-Capillary: 221 mg/dL — ABNORMAL HIGH (ref 70–99)
Glucose-Capillary: 270 mg/dL — ABNORMAL HIGH (ref 70–99)
Glucose-Capillary: 283 mg/dL — ABNORMAL HIGH (ref 70–99)
Glucose-Capillary: 426 mg/dL — ABNORMAL HIGH (ref 70–99)

## 2020-04-23 LAB — BLOOD CULTURE ID PANEL (REFLEXED) - BCID2

## 2020-04-23 LAB — CBC
HCT: 24 % — ABNORMAL LOW (ref 36.0–46.0)
Hemoglobin: 7 g/dL — ABNORMAL LOW (ref 12.0–15.0)
MCH: 29.8 pg (ref 26.0–34.0)
MCHC: 29.2 g/dL — ABNORMAL LOW (ref 30.0–36.0)
MCV: 102.1 fL — ABNORMAL HIGH (ref 80.0–100.0)
Platelets: 148 10*3/uL — ABNORMAL LOW (ref 150–400)
RBC: 2.35 MIL/uL — ABNORMAL LOW (ref 3.87–5.11)
RDW: 17.4 % — ABNORMAL HIGH (ref 11.5–15.5)
WBC: 6.9 10*3/uL (ref 4.0–10.5)
nRBC: 3.8 % — ABNORMAL HIGH (ref 0.0–0.2)

## 2020-04-23 LAB — BASIC METABOLIC PANEL
Anion gap: 9 (ref 5–15)
BUN: 39 mg/dL — ABNORMAL HIGH (ref 6–20)
CO2: 35 mmol/L — ABNORMAL HIGH (ref 22–32)
Calcium: 8.5 mg/dL — ABNORMAL LOW (ref 8.9–10.3)
Chloride: 107 mmol/L (ref 98–111)
Creatinine, Ser: 0.82 mg/dL (ref 0.44–1.00)
GFR, Estimated: >60 mL/min (ref >60)
Glucose, Bld: 188 mg/dL — ABNORMAL HIGH (ref 70–99)
Potassium: 3.8 mmol/L (ref 3.5–5.1)
Sodium: 151 mmol/L — ABNORMAL HIGH (ref 135–145)

## 2020-04-23 LAB — TRIGLYCERIDES: Triglycerides: 303 mg/dL — ABNORMAL HIGH (ref <150)

## 2020-04-23 LAB — HEPARIN ANTI-XA: Heparin LMW: 0.32 IU/mL

## 2020-04-23 LAB — PATHOLOGIST SMEAR REVIEW

## 2020-04-23 MED ORDER — SODIUM CHLORIDE 0.9 % IV SOLN
0.0000 ug/kg/min | INTRAVENOUS | Status: DC
  Administered 2020-04-23: 3 ug/kg/min via INTRAVENOUS
  Administered 2020-04-24 (×2): 3.5 ug/kg/min via INTRAVENOUS
  Administered 2020-04-24: 4 ug/kg/min via INTRAVENOUS
  Administered 2020-04-24: 10 ug/kg/min via INTRAVENOUS
  Administered 2020-04-25 – 2020-04-26 (×4): 3.5 ug/kg/min via INTRAVENOUS
  Administered 2020-04-26: 6 ug/kg/min via INTRAVENOUS
  Filled 2020-04-23 (×12): qty 20

## 2020-04-23 MED ORDER — MIDAZOLAM HCL 2 MG/2ML IJ SOLN
INTRAMUSCULAR | Status: AC
  Filled 2020-04-23: qty 4

## 2020-04-23 MED ORDER — ENOXAPARIN SODIUM 80 MG/0.8ML ~~LOC~~ SOLN
80.0000 mg | Freq: Two times a day (BID) | SUBCUTANEOUS | Status: DC
  Administered 2020-04-23 – 2020-04-26 (×7): 80 mg via SUBCUTANEOUS
  Filled 2020-04-23 (×8): qty 0.8

## 2020-04-23 MED ORDER — DEXTROSE 50 % IV SOLN: 0.0000 mL | INTRAVENOUS | Status: DC | PRN

## 2020-04-23 MED ORDER — CISATRACURIUM BOLUS VIA INFUSION
2.5000 mg | Freq: Once | INTRAVENOUS | Status: AC
  Administered 2020-04-23: 2.5 mg via INTRAVENOUS
  Filled 2020-04-23: qty 3

## 2020-04-23 MED ORDER — INSULIN REGULAR(HUMAN) IN NACL 100-0.9 UT/100ML-% IV SOLN
INTRAVENOUS | Status: DC
  Administered 2020-04-23 (×2): 24 [IU]/h via INTRAVENOUS
  Administered 2020-04-24: 8 [IU]/h via INTRAVENOUS
  Administered 2020-04-24: 6 [IU]/h via INTRAVENOUS
  Administered 2020-04-25: 6.5 [IU]/h via INTRAVENOUS
  Administered 2020-04-26: 4.4 [IU]/h via INTRAVENOUS
  Filled 2020-04-23 (×7): qty 100

## 2020-04-23 NOTE — Progress Notes (Signed)
PHARMACY - PHYSICIAN COMMUNICATION CRITICAL VALUE ALERT - BLOOD CULTURE IDENTIFICATION (BCID)  Sandra [name] is an 45 y.o. female who presented to [facility] on 04-26-2020 with a chief complaint of COVID pneumonia  Assessment:  44 yof with COVID pneumonia. 1o3 bottles from same set of blood cultures growing staphylococcus epidermidis with methicillin resistance mecA/C detected.   Name of physician (or Provider) Contacted: Icard  Current antibiotics: Cefepime and Vancomycin  Changes to prescribed antibiotics recommended:  Continue current regimen  Results for orders placed or performed during the hospital encounter of 26-Apr-2020  Blood Culture ID Panel (Reflexed) (Collected: 04/22/2020 10:44 AM)  Result Value Ref Range   Enterococcus faecalis NOT DETECTED NOT DETECTED   Enterococcus Faecium NOT DETECTED NOT DETECTED   Listeria monocytogenes NOT DETECTED NOT DETECTED   Staphylococcus species DETECTED (A) NOT DETECTED   Staphylococcus aureus (BCID) NOT DETECTED NOT DETECTED   Staphylococcus epidermidis DETECTED (A) NOT DETECTED   Staphylococcus lugdunensis NOT DETECTED NOT DETECTED   Streptococcus species NOT DETECTED NOT DETECTED   Streptococcus agalactiae NOT DETECTED NOT DETECTED   Streptococcus pneumoniae NOT DETECTED NOT DETECTED   Streptococcus pyogenes NOT DETECTED NOT DETECTED   A.calcoaceticus-baumannii NOT DETECTED NOT DETECTED   Bacteroides fragilis NOT DETECTED NOT DETECTED   Enterobacterales NOT DETECTED NOT DETECTED   Enterobacter cloacae complex NOT DETECTED NOT DETECTED   Escherichia coli NOT DETECTED NOT DETECTED   Klebsiella aerogenes NOT DETECTED NOT DETECTED   Klebsiella oxytoca NOT DETECTED NOT DETECTED   Klebsiella pneumoniae NOT DETECTED NOT DETECTED   Proteus species NOT DETECTED NOT DETECTED   Salmonella species NOT DETECTED NOT DETECTED   Serratia marcescens NOT DETECTED NOT DETECTED   Haemophilus influenzae NOT DETECTED NOT DETECTED   Neisseria  meningitidis NOT DETECTED NOT DETECTED   Pseudomonas aeruginosa NOT DETECTED NOT DETECTED   Stenotrophomonas maltophilia NOT DETECTED NOT DETECTED   Candida albicans NOT DETECTED NOT DETECTED   Candida auris NOT DETECTED NOT DETECTED   Candida glabrata NOT DETECTED NOT DETECTED   Candida krusei NOT DETECTED NOT DETECTED   Candida parapsilosis NOT DETECTED NOT DETECTED   Candida tropicalis NOT DETECTED NOT DETECTED   Cryptococcus neoformans/gattii NOT DETECTED NOT DETECTED   Methicillin resistance mecA/C DETECTED (A) NOT DETECTED   Isaias Sakai, PharmD, MBA Pharmacy Resident [phone] 04/23/2020 1:15 PM

## 2020-04-23 NOTE — Progress Notes (Signed)
Inpatient Diabetes Program Recommendations  AACE/ADA: New Consensus Statement on Inpatient Glycemic Control (2015)  Target Ranges:  Prepandial:   less than 140 mg/dL      Peak postprandial:   less than 180 mg/dL (1-2 hours)      Critically ill patients:  140 - 180 mg/dL   Lab Results  Component Value Date   GLUCAP 204 (H) 04/23/2020   HGBA1C 6.6 (H) 04/02/2020    Review of Glycemic Control Results for Sandra [name], Sandra [name] (MRN [id number]) as of 04/23/2020 09:45  Ref. Range 04/22/2020 16:06 04/22/2020 19:40 04/23/2020 00:08 04/23/2020 03:18 04/23/2020 07:43  Glucose-Capillary Latest Ref Range: 70 - 99 mg/dL 295 (H) 188 (H) 416 (H) 200 (H) 204 (H)   Diabetes history: No DM hx Outpatient Diabetes medications: none Current orders for Inpatient glycemic control: Levemir 50 units BID, Novolog 6 units Q4H, Novolog 0-20 units Q4H Vital @ 60 ml/hr Solumedrol dcd today  Inpatient Diabetes Program Recommendations:    Consider further increasing Levemir to 60 units BID.   Thanks, Lujean Rave, MSN, RNC-OB Diabetes Coordinator [phone] (8a-5p)

## 2020-04-23 NOTE — Consult Note (Signed)
Consultation Note Date: 04/23/2020   Patient Name: Sandra [name]  DOB: October 12, 1975  MRN: [id number]  Age / Sex: 45 y.o., female  PCP: Ignatius Specking, MD Referring Physician: Josephine Igo, DO  Reason for Consultation: Establishing goals of care and Psychosocial/spiritual support  HPI/Patient Profile: 45 y.o. female unvaccinated admitted on 04/29/2020 presenting  with Covid pneumonia.  Over the next 14 days she has developed Covid ARDS.  She progressed from high flow nasal cannula to BiPAP and then ultimately was intubated on 04/12/2020.  She has been treated with remdesivir steroids and baricitnib.  On 02/14 patient had a small segmental right lower lobe pulmonary embolism and was converted to full dose  Condition is tenuous with full medical supports  UOP has decreased today, latest Cr not c/w AKI but worried that renal function may be declining.   -PRVC 100% FiO2 RR 28 Vt 400 PEEP 16, CXR worsening  Concern for survival of hospitalization.     Family face treatment option decisions, advanced directive decisions and anticipatory care needs    Clinical Assessment and Goals of Care:  Family preacher at bedside   This NP Lorinda Creed reviewed medical records, received report from team, assessed the patient and then spoke to daughter/Sandra [name] by telephone from the patient's bedside  to discuss diagnosis, prognosis, GOC, EOL wishes disposition and options.   Concept of Palliative Care was introduced as specialized medical care for people and their families living with serious illness.  If focuses on providing relief from the symptoms and stress of a serious illness.  The goal is to improve quality of life for both the patient and the family.    Values and goals of care important to patient and family were attempted to be elicited.  Created space and opportunity for daughter  to explore thoughts and  feelings regarding current medical situation.  She understands the seriousness of her mother's condition, she and her family remain hopeful for improvement.   Family has had multiple losses recently.  This patient's mother died three weeks ago. She and her family worry that if any limitations are put in place (ie DNR)  "the doctors will not try everything".   Eduction offered regarding DNR status and actually the only thing we are recommending is no compressions, no shock.  She understands and for now no changes.  Education /discussion around best case vs worst case scenario.      A  discussion was had today regarding advanced directives.  Concepts specific to code status, artifical feeding and hydration, continued IV antibiotics and rehospitalization was had.    The difference between a aggressive medical intervention path  and a palliative comfort care path for this patient at this time was had.      Questions and concerns addressed.  FAmily  encouraged to call with questions or concerns.     PMT will continue to support holistically, I plan to f/u in the morning      No documented HPOA, patient's daughter, son, father and fiance  all work together      SUMMARY OF RECOMMENDATIONS    Code Status/Advance Care Planning:  Full code   Palliative Prophylaxis:   Aspiration, Bowel Regimen, Delirium Protocol, Frequent Pain Assessment and Oral Care  Additional Recommendations (Limitations, Scope, Preferences):  Full Scope Treatment  Psycho-social/Spiritual:   Desire for further Chaplaincy support:no, strong community church support    Discharge Planning: To Be Determined      Primary Diagnoses: Present on Admission: . Acute renal failure (HCC) . GERD (gastroesophageal reflux disease) . Headache, migraine . Hypotension . Morbid obesity (HCC) . Pseudotumor cerebri . Acute respiratory disease due to COVID-19 virus . Thrombocytopenia (HCC) . POTS (postural orthostatic  tachycardia syndrome) . Pneumonia due to COVID-19 virus . Acute respiratory failure with hypoxia Due to Covid Resp Infection . Pulmonary embolism (HCC)   I have reviewed the medical record, interviewed the patient and family, and examined the patient. The following aspects are pertinent.  Past Medical History:  Diagnosis Date  . Anxiety   . Arthritis   . Chronic back pain   . Chronic migraine   . Constipation   . Depression   . Diabetes mellitus (HCC)    A1C 6.6% on 04/13/20  . Fibromyalgia   . GERD (gastroesophageal reflux disease)   . History of bronchitis   . History of cardiac catheterization    Normal coronaries 2007  . History of gastric ulcer   . History of hiatal hernia   . History of pneumonia   . Hypertension   . Idiopathic intracranial hypertension   . POTS (postural orthostatic tachycardia syndrome)   . Seasonal allergies    Social History   Socioeconomic History  . Marital status: Divorced    Spouse name: Jobey  . Number of children: 2  . Years of education: 3612  . Highest education level: Not on file  Occupational History    Comment: [employer]  Tobacco Use  . Smoking status: Never Smoker  . Smokeless tobacco: Never Used  Substance and Sexual Activity  . Alcohol use: No    Alcohol/week: 0.0 standard drinks  . Drug use: No  . Sexual activity: Not on file  Other Topics Concern  . Not on file  Social History Narrative   Patient lives at home with her children and she is se prated.   Patient is out of work at this time.   Education one year of college.   Caffeine one cup of sweet daily.   Social Determinants of Health   Financial Resource Strain: Not on file  Food Insecurity: Not on file  Transportation Needs: Not on file  Physical Activity: Not on file  Stress: Not on file  Social Connections: Not on file   Family History  Problem Relation Age of Onset  . Kidney cancer Mother   . Diabetes Mother   . Hypertension Mother   . Thyroid  disease Mother   . Ulcers Father   . Coronary artery disease Father        Premature CAD  . Hypertension Father   . Prostate cancer Maternal Grandfather   . Pancreatic cancer Maternal Grandmother   . Lung cancer Paternal Grandmother   . Aortic aneurysm Other   . Colon cancer Neg Hx    Scheduled Meds: . alteplase  2 mg Intracatheter Once  . artificial tears  1 application Both Eyes Q8H  . vitamin C  500 mg Per Tube Daily  . azelastine  2 spray Each Nare Daily  .  cholecalciferol  25 mcg Per Tube Daily  . clonazePAM  2 mg Per Tube Q8H  . docusate  100 mg Per Tube BID  . enoxaparin (LOVENOX) injection  80 mg Subcutaneous Q12H  . feeding supplement (PROSource TF)  90 mL Per Tube BID  . FLUoxetine  40 mg Per Tube Daily  . HYDROmorphone  4 mg Per Tube Q6H  . insulin aspart  0-20 Units Subcutaneous Q4H  . insulin aspart  6 Units Subcutaneous Q4H  . insulin detemir  50 Units Subcutaneous BID  . mouth rinse  15 mL Mouth Rinse 10 times per day  . midazolam      . nystatin  5 mL Mouth/Throat QID  . oxymetazoline  1 spray Each Nare BID  . pantoprazole (PROTONIX) IV  40 mg Intravenous Daily  . polyethylene glycol  17 g Per Tube Daily  . QUEtiapine  50 mg Per Tube BID  . sodium chloride flush  10-40 mL Intracatheter Q12H  . sorbitol  60 mL Per Tube Q0600  . zinc sulfate  220 mg Per Tube Daily   Continuous Infusions: . sodium chloride 10 mL/hr at 04/23/20 1400  . ceFEPime (MAXIPIME) IV 2 g (04/23/20 1413)  . feeding supplement (PIVOT 1.5 CAL) 1,000 mL (04/23/20 1150)  . HYDROmorphone 7 mg/hr (04/23/20 1414)  . midazolam 10 mg/hr (04/23/20 1400)  . norepinephrine (LEVOPHED) Adult infusion 27 mcg/min (04/23/20 1400)  . propofol (DIPRIVAN) infusion 80 mcg/kg/min (04/23/20 1422)  . vancomycin Stopped (04/23/20 1034)   PRN Meds:.sodium chloride, acetaminophen (TYLENOL) oral liquid 160 mg/5 mL, HYDROmorphone, midazolam, [DISCONTINUED] ondansetron **OR** ondansetron (ZOFRAN) IV, sodium  chloride, sodium chloride flush, vecuronium Medications Prior to Admission:  Prior to Admission medications   Medication Sig Start Date End Date Taking? Authorizing Provider  acetaminophen (TYLENOL) 500 MG tablet Take 1,000 mg by mouth every 6 (six) hours as needed.   Yes [provider]  acetaZOLAMIDE (DIAMOX) 250 MG tablet Take 250 mg by mouth 2 (two) times daily. 11/18/19  Yes [provider]  acyclovir (ZOVIRAX) 200 MG capsule Take 200 mg by mouth daily. 03/09/20  Yes [provider]  albuterol (VENTOLIN HFA) 108 (90 Base) MCG/ACT inhaler Inhale into the lungs every 6 (six) hours as needed for wheezing or shortness of breath.   Yes [provider]  Ascorbic Acid (VITAMIN C) 1000 MG tablet Take 2,000 mg by mouth daily.   Yes [provider]  atenolol (TENORMIN) 25 MG tablet Take 1 tablet (25 mg total) by mouth daily. 01/28/19 09/02/19 Yes Jonelle Sidle, MD  Azelastine HCl 137 MCG/SPRAY SOLN Place 2 puffs into both nostrils daily. 01/13/19  Yes [provider]  BREO ELLIPTA 100-25 MCG/INH AEPB Inhale 1 puff into the lungs daily. 01/13/19  Yes [provider]  butalbital-acetaminophen-caffeine (FIORICET, ESGIC) 50-325-40 MG tablet Take 1 tablet by mouth every 6 (six) hours as needed for headache. 04/06/15  Yes Vassie Loll, MD  diazepam (VALIUM) 5 MG tablet Take 5 mg by mouth every 6 (six) hours as needed for anxiety.   Yes [provider]  diphenhydrAMINE (BENADRYL) 25 mg capsule Take 25 mg by mouth every 6 (six) hours as needed for itching.   Yes [provider]  FLUoxetine (PROZAC) 40 MG capsule Take 40 mg by mouth daily. 03/04/20  Yes [provider]  furosemide (LASIX) 20 MG tablet Take by mouth. 12/08/18  Yes [provider]  hydrochlorothiazide (HYDRODIURIL) 12.5 MG tablet Take 12.5 mg by mouth daily.  06/08/17  Yes [provider]  levocetirizine (XYZAL) 5 MG tablet Take 1 tablet by  mouth daily. 06/21/19  Yes [provider]  meclizine (ANTIVERT) 25 MG tablet Take 1 tablet (25 mg total) by mouth 3 (three) times daily as needed for dizziness. 08/11/14  Yes Anson Fret, MD  Multiple Vitamins-Minerals (EMERGEN-C IMMUNE PO) Take 1 packet by mouth daily.   Yes [provider]  pantoprazole (PROTONIX) 40 MG tablet Take 1 tablet (40 mg total) by mouth 2 (two) times daily. 12/20/18  Yes Ermalinda Memos S, PA-C  pravastatin (PRAVACHOL) 40 MG tablet Take 40 mg by mouth daily.   Yes [provider]  predniSONE (STERAPRED UNI-PAK 21 TAB) 5 MG (21) TBPK tablet See admin instructions. 03/26/20  Yes [provider]  pregabalin (LYRICA) 300 MG capsule Take 150 mg by mouth at bedtime.    Yes [provider]  promethazine (PHENERGAN) 25 MG tablet Take 25 mg by mouth every 6 (six) hours as needed for nausea or vomiting.   Yes [provider]  rizatriptan (MAXALT-MLT) 5 MG disintegrating tablet Take 5 mg by mouth daily as needed for migraine. May repeat in 2 hours if needed   Yes [provider]  Vitamin D, Cholecalciferol, 25 MCG (1000 UT) TABS Take 1 tablet by mouth daily as needed.   Yes [provider]  zinc gluconate 50 MG tablet Take 50 mg by mouth daily.   Yes [provider]  azithromycin (ZITHROMAX) 250 MG tablet Take by mouth. Patient not taking: No sig reported 03/26/20   [provider]  benzonatate (TESSALON) 100 MG capsule Take 200 mg by mouth every 8 (eight) hours as needed. 03/26/20   [provider]  doxycycline (ADOXA) 100 MG tablet Take 100 mg by mouth 2 (two) times daily. Patient not taking: No sig reported 01/24/20   [provider]  fluconazole (DIFLUCAN) 150 MG tablet Take 150 mg by mouth once. Patient not taking: No sig reported 10/13/19   [provider]  HYDROcodone-acetaminophen (NORCO/VICODIN) 5-325 MG tablet Take by mouth. Patient not taking: No sig  reported 07/04/19   [provider]  hydrocortisone (ANUSOL-HC) 2.5 % rectal cream Place 1 application rectally 2 (two) times daily. Patient not taking: No sig reported 03/17/19   Letta Median, PA-C  levofloxacin (LEVAQUIN) 500 MG tablet Take 500 mg by mouth daily. Patient not taking: No sig reported 10/11/19   [provider]  lubiprostone (AMITIZA) 8 MCG capsule Take 1 capsule (8 mcg total) by mouth 2 (two) times daily with a meal. Patient not taking: No sig reported 12/20/18   Letta Median, PA-C  nystatin (MYCOSTATIN) 100000 UNIT/ML suspension Take by mouth. Patient not taking: No sig reported 01/06/20   [provider]  tetrahydrozoline 0.05 % ophthalmic solution Place 1 drop into both eyes as needed (For allergies). Patient not taking: Reported on 2020/04/20    [provider]  tiZANidine (ZANAFLEX) 2 MG tablet Take 2 mg by mouth 3 (three) times daily. Patient not taking: No sig reported 11/18/19   [provider]   Allergies  Allergen Reactions  . Ambien [Zolpidem Tartrate] Rash and Other (See Comments)    Pt states she cannot walk after taking this medication  . Penicillins Hives and Swelling    Has patient had a PCN reaction causing immediate rash, facial/tongue/throat swelling, SOB or lightheadedness with hypotension: Yes Has patient had a PCN reaction causing severe rash involving mucus membranes or skin  necrosis: Yes Has patient had a PCN reaction occurring within the last 10 years: Yes If all of the above answers are "NO", then may proceed with Cephalosporin use. Pt had ceftriaxone and ceftazidime 01/2014 and 2019  . Topamax [Topiramate] Other (See Comments)    Gives her kidney stones  . Hydrocodone Itching  . Chlorhexidine Gluconate Itching and Rash    "It felt bees were stinging me"  . Latex Rash  . Versed [Midazolam] Itching   Review of Systems  Unable to perform ROS: Intubated    Physical Exam Constitutional:       Appearance: She is obese. She is ill-appearing.     Interventions: She is intubated.  Cardiovascular:     Rate and Rhythm: Tachycardia present.  Pulmonary:     Effort: Tachypnea present. She is intubated.  Skin:    General: Skin is warm and dry.     Vital Signs: BP (!) 149/58   Pulse (!) 112   Temp (!) 100.7 F (38.2 C) (Axillary)   Resp (!) 28   Ht 5\' 4"  (1.626 m)   Wt 114.7 kg   SpO2 97%   BMI 43.40 kg/m  Pain Scale: CPOT POSS *See Group Information*: 2-Acceptable,Slightly drowsy, easily aroused Pain Score: 0-No pain   SpO2: SpO2: 97 % O2 Device:SpO2: 97 % O2 Flow Rate: .O2 Flow Rate (L/min): 40 L/min  IO: Intake/output summary:   Intake/Output Summary (Last 24 hours) at 04/23/2020 1433 Last data filed at 04/23/2020 1400 Gross per 24 hour  Intake 4990.83 ml  Output 3015 ml  Net 1975.83 ml    LBM: Last BM Date: 04/23/20 Baseline Weight: Weight: 122.5 kg Most recent weight: Weight: 114.7 kg     Palliative Assessment/Data: 20 %   Discussed with Dr 04/25/20   Time In: 1400 Time Out: 1510 Time Total: 1510 Greater than 50%  of this time was spent counseling and coordinating care related to the above assessment and plan.  Signed by: 1511, NP   Please contact Palliative Medicine Team phone at [phone] for questions and concerns.  For individual provider: See [phone]

## 2020-04-23 NOTE — Progress Notes (Addendum)
ANTICOAGULATION CONSULT NOTE - Follow Up Consult  Pharmacy Consult for enoxaprin Indication: pulmonary embolus  Allergies  Allergen Reactions  . Ambien [Zolpidem Tartrate] Rash and Other (See Comments)    Pt states she cannot walk after taking this medication  . Penicillins Hives and Swelling    Has patient had a PCN reaction causing immediate rash, facial/tongue/throat swelling, SOB or lightheadedness with hypotension: Yes Has patient had a PCN reaction causing severe rash involving mucus membranes or skin necrosis: Yes Has patient had a PCN reaction occurring within the last 10 years: Yes If all of the above answers are "NO", then may proceed with Cephalosporin use. Pt had ceftriaxone and ceftazidime 01/2014 and 2019  . Topamax [Topiramate] Other (See Comments)    Gives her kidney stones  . Hydrocodone Itching  . Chlorhexidine Gluconate Itching and Rash    "It felt bees were stinging me"  . Latex Rash  . Versed [Midazolam] Itching    Patient Measurements: Height: 5\' 4"  (162.6 cm) Weight: 114.7 kg (252 lb 13.9 oz) IBW/kg (Calculated) : 54.7  Vital Signs: Temp: 100 F (37.8 C) (02/28 0300) Temp Source: Oral (02/28 0300) BP: 113/43 (02/28 0700) Pulse Rate: 97 (02/28 0715)  Labs: Recent Labs    04/21/20 0323 04/22/20 0121 04/22/20 0410 04/22/20 1027 04/22/20 1629 04/23/20 0445  HGB 8.4* 7.7* 7.8*  --   --  7.0*  HCT 27.7* 26.6* 23.0*  --   --  24.0*  PLT 128* 126*  --   --   --  148*  APTT  --  34  --   --   --   --   LABPROT  --  13.7  --   --   --   --   INR  --  1.1  --   --   --   --   HEPRLOWMOCWT  --   --   --  1.26  --  0.32  CREATININE 0.75 0.82  --   --  0.93 0.82    Estimated Creatinine Clearance: 107.6 mL/min (by C-G formula based on SCr of 0.82 mg/dL).   Medical History: Past Medical History:  Diagnosis Date  . Anxiety   . Arthritis   . Chronic back pain   . Chronic migraine   . Constipation   . Depression   . Diabetes mellitus (HCC)     A1C 6.6% on 04/13/20  . Fibromyalgia   . GERD (gastroesophageal reflux disease)   . History of bronchitis   . History of cardiac catheterization    Normal coronaries 2007  . History of gastric ulcer   . History of hiatal hernia   . History of pneumonia   . Hypertension   . Idiopathic intracranial hypertension   . POTS (postural orthostatic tachycardia syndrome)   . Seasonal allergies     Assessment: 44yof admitted with COVID pneumonia and found to have small PE on chest. Enoxaparin held 2/27 with elevated level and nasal bleeding. Enoxaparin level has now normalized, nasal bleeding improved so will resume. Pt previously receiving 120mg  SQ BID, will adjust to 80mg  SQ BID.  Goal of Therapy:  Anti-Xa level 0.6-1 units/ml 4hrs after LMWH dose given Monitor platelets by anticoagulation protocol: Yes   Plan:  Enoxaparin 80mg  SQ BID Check level at new Css  3/27, PharmD, BCPS, [facility] Clinical Pharmacist [phone] Please check AMION for all [facility] Pharmacy numbers 04/23/2020

## 2020-04-23 NOTE — Progress Notes (Signed)
NAME:  Sandra [name], MRN:  [id number], DOB:  [birth year], LOS: 24 ADMISSION DATE:  04/09/2020,   Brief History:  This is a 45 year old female with COVID-19 pneumonia and ARDS  History of Present Illness:  Is a 45 year old unvaccinated white female who presented to the hospital on 04/01/2020 with Covid pneumonia.  Over the next 14 days she has developed Covid ARDS.  She progressed from high flow nasal cannula to BiPAP and then ultimately was intubated on 04/12/2020.  She has been treated with remdesivir steroids and baricitnib.  On 02/14 patient had a small segmental right lower lobe pulmonary embolism and was converted to full dose Lovenox.  Past Medical History:  Anxiety Arthritis Depression Fibromyalgia GERD Gastric ulceration Hiatal hernia POTS Hypertension Morbid obesity  Significant Hospital Events:  Intubation  Consults:  Critical care medicine  Procedures:  04/13/20 intubated 04/13/2020 triple-lumen catheter placed  Significant Diagnostic Tests:    Micro Data:  Covid + 03/28/2020  Antimicrobials:  Levaquin  Subjective   Patient remains critically ill intubated on mechanical life support maximal vent settings.  Episodes of desaturation still requiring heavy sedation and as needed paralysis  Objective   Blood pressure (!) 117/44, pulse (!) 104, temperature (!) 100.4 F (38 C), temperature source Axillary, resp. rate (!) 25, height 5\' 4"  (1.626 m), weight 114.7 kg, SpO2 96 %.    Vent Mode: PRVC FiO2 (%):  [100 %] 100 % Set Rate:  [28 bmp] 28 bmp Vt Set:  [400 mL] 400 mL PEEP:  [16 cmH20] 16 cmH20 Plateau Pressure:  [30 cmH20-32 cmH20] 31 cmH20   Intake/Output Summary (Last 24 hours) at 04/23/2020 0855 Last data filed at 04/23/2020 0800 Gross per 24 hour  Intake 3757.15 ml  Output 2710 ml  Net 1047.15 ml   Filed Weights   04/21/20 0400 04/22/20 0434 04/23/20 0500  Weight: 115.2 kg 116.1 kg 114.7 kg    Examination: Constitutional: Young female  intubated mechanical life support HEENT:: NCAT, nasal packing, endotracheal tube in place Cardiovascular: Regular rhythm, S1-S2 Respiratory: Bilateral mechanically ventilated breath sounds Gastrointestinal: Bowel sounds present, mildly distended Skin: No rash Neurologic: Sedated on mechanical support   Assessment & Plan:   Acute respiratory failure requiring intubation and mechanical ventilation Covid ARDS COVID- associated pulmonary embolsim Suspected pulmonary edema superimposed Covid pneumonia Morbid obesity Idiopathic intracranial hypertension s/p VP shunt POTS Chronic pain, fibromyalgia Enterococcal UTI- treated with levaquin Hyperglycemia Constipation- ongoing ABLA from nose bleeding- after proning, required packing x 2 2/22 Hypotension shock  Continue mechanical ventilation per ARDS protocol Target TVol 6-8cc/kgIBW Target Plateau Pressure < 30cm H20 Target driving pressure less than 15 cm of water Target PaO2 55-65: titrate PEEP/FiO2 per protocol Holding off on proning again due to facial edema and nosebleeds. Still with episodes of significant desaturation. I think we need to continue ongoing goals of care discussion with family.  I spoke with the daughter yesterday at length at bedside. My recommendation would be consideration for DNR status with no chest compressions.  We can continue current maximal vent support.  If we see no significant progression at some point time we may need to consider a more comfort care approach.  Remains on significant sedation Continue as needed vecuronium Would like to avoid continuous paralysis. Chest x-ray reviewed with persistent bilateral infiltrates.  Continue ceftriaxone Continue Afrin for nosebleed can use as needed TXA which worked well yesterday.  Goal CBG 140-180, SSI plus Levemir.  Can restart full dose Lovenox  Continue to titrate norepinephrine to maintain  mean arterial pressure greater than 65 mmHg.  Overall, I  believe the patient's prognosis is poor. Consult to palliative care    Best practice (evaluated daily)  Diet: TF pain/Anxiety/Delirium protocol (if indicated): fent/versed VAP protocol (if indicated): in place DVT prophylaxis: Full dose Lovenox GI prophylaxis: Protonix Glucose control: basal bolus as ordered Mobility: Bedrest Disposition: Intensive care unit  Goals of Care:   Code Status: Full Confirmed on 04/20/2020 Spoke with patient's daughter yesterday at bedside 04/22/2020 Spoke with fianc at bedside 04/23/2020   This patient is critically ill with multiple organ system failure; which, requires frequent high complexity decision making, assessment, support, evaluation, and titration of therapies. This was completed through the application of advanced monitoring technologies and extensive interpretation of multiple databases. During this encounter critical care time was devoted to patient care services described in this note for 33 minutes.  Josephine Igo, DO [facility] 04/23/2020 8:55 AM

## 2020-04-23 NOTE — Progress Notes (Signed)
Patient's COVID precautions ended 04/23/20, may have visitors.

## 2020-04-24 ENCOUNTER — Inpatient Hospital Stay (HOSPITAL_COMMUNITY): Payer: Medicare Other

## 2020-04-24 DIAGNOSIS — U071 COVID-19: Secondary | ICD-10-CM | POA: Diagnosis not present

## 2020-04-24 DIAGNOSIS — J1282 Pneumonia due to coronavirus disease 2019: Secondary | ICD-10-CM | POA: Diagnosis not present

## 2020-04-24 LAB — GLUCOSE, CAPILLARY
Glucose-Capillary: 141 mg/dL — ABNORMAL HIGH (ref 70–99)
Glucose-Capillary: 142 mg/dL — ABNORMAL HIGH (ref 70–99)
Glucose-Capillary: 149 mg/dL — ABNORMAL HIGH (ref 70–99)
Glucose-Capillary: 152 mg/dL — ABNORMAL HIGH (ref 70–99)
Glucose-Capillary: 156 mg/dL — ABNORMAL HIGH (ref 70–99)
Glucose-Capillary: 160 mg/dL — ABNORMAL HIGH (ref 70–99)
Glucose-Capillary: 161 mg/dL — ABNORMAL HIGH (ref 70–99)
Glucose-Capillary: 165 mg/dL — ABNORMAL HIGH (ref 70–99)
Glucose-Capillary: 171 mg/dL — ABNORMAL HIGH (ref 70–99)
Glucose-Capillary: 172 mg/dL — ABNORMAL HIGH (ref 70–99)
Glucose-Capillary: 174 mg/dL — ABNORMAL HIGH (ref 70–99)
Glucose-Capillary: 174 mg/dL — ABNORMAL HIGH (ref 70–99)
Glucose-Capillary: 176 mg/dL — ABNORMAL HIGH (ref 70–99)
Glucose-Capillary: 179 mg/dL — ABNORMAL HIGH (ref 70–99)
Glucose-Capillary: 179 mg/dL — ABNORMAL HIGH (ref 70–99)
Glucose-Capillary: 192 mg/dL — ABNORMAL HIGH (ref 70–99)
Glucose-Capillary: 200 mg/dL — ABNORMAL HIGH (ref 70–99)
Glucose-Capillary: 261 mg/dL — ABNORMAL HIGH (ref 70–99)
Glucose-Capillary: 332 mg/dL — ABNORMAL HIGH (ref 70–99)
Glucose-Capillary: 379 mg/dL — ABNORMAL HIGH (ref 70–99)

## 2020-04-24 LAB — CBC
HCT: 24.8 % — ABNORMAL LOW (ref 36.0–46.0)
Hemoglobin: 7 g/dL — ABNORMAL LOW (ref 12.0–15.0)
MCH: 29.2 pg (ref 26.0–34.0)
MCHC: 28.2 g/dL — ABNORMAL LOW (ref 30.0–36.0)
MCV: 103.3 fL — ABNORMAL HIGH (ref 80.0–100.0)
Platelets: 166 10*3/uL (ref 150–400)
RBC: 2.4 MIL/uL — ABNORMAL LOW (ref 3.87–5.11)
RDW: 16.8 % — ABNORMAL HIGH (ref 11.5–15.5)
WBC: 5.3 10*3/uL (ref 4.0–10.5)
nRBC: 3.6 % — ABNORMAL HIGH (ref 0.0–0.2)

## 2020-04-24 LAB — BASIC METABOLIC PANEL
Anion gap: 9 (ref 5–15)
BUN: 30 mg/dL — ABNORMAL HIGH (ref 6–20)
CO2: 33 mmol/L — ABNORMAL HIGH (ref 22–32)
Calcium: 8.9 mg/dL (ref 8.9–10.3)
Chloride: 107 mmol/L (ref 98–111)
Creatinine, Ser: 0.76 mg/dL (ref 0.44–1.00)
GFR, Estimated: >60 mL/min (ref >60)
Glucose, Bld: 186 mg/dL — ABNORMAL HIGH (ref 70–99)
Potassium: 4.1 mmol/L (ref 3.5–5.1)
Sodium: 149 mmol/L — ABNORMAL HIGH (ref 135–145)

## 2020-04-24 MED ORDER — FUROSEMIDE 10 MG/ML IJ SOLN
40.0000 mg | Freq: Once | INTRAMUSCULAR | Status: AC
  Administered 2020-04-24: 40 mg via INTRAVENOUS
  Filled 2020-04-24: qty 4

## 2020-04-24 MED ORDER — LINEZOLID 600 MG/300ML IV SOLN
600.0000 mg | Freq: Two times a day (BID) | INTRAVENOUS | Status: DC
  Administered 2020-04-24 – 2020-04-25 (×2): 600 mg via INTRAVENOUS
  Filled 2020-04-24 (×3): qty 300

## 2020-04-24 MED ORDER — PIVOT 1.5 CAL PO LIQD
1000.0000 mL | ORAL | Status: DC
  Administered 2020-04-24: 1000 mL

## 2020-04-24 MED ORDER — PROSOURCE TF PO LIQD
90.0000 mL | Freq: Three times a day (TID) | ORAL | Status: DC
  Administered 2020-04-24 – 2020-04-26 (×6): 90 mL
  Filled 2020-04-24 (×6): qty 90

## 2020-04-24 MED ORDER — FREE WATER
200.0000 mL | Freq: Four times a day (QID) | Status: DC
  Administered 2020-04-24 – 2020-04-26 (×9): 200 mL

## 2020-04-24 MED ORDER — NOREPINEPHRINE 16 MG/250ML-% IV SOLN
0.0000 ug/min | INTRAVENOUS | Status: DC
  Administered 2020-04-24: 16 ug/min via INTRAVENOUS
  Administered 2020-04-24: 21 ug/min via INTRAVENOUS
  Administered 2020-04-24: 15 ug/min via INTRAVENOUS
  Administered 2020-04-24: 22 ug/min via INTRAVENOUS
  Administered 2020-04-24: 23 ug/min via INTRAVENOUS
  Administered 2020-04-25 (×2): 22 ug/min via INTRAVENOUS
  Filled 2020-04-24 (×7): qty 250

## 2020-04-24 NOTE — Procedures (Signed)
Central Venous Catheter Insertion Procedure Note  Sandra [name]  [id number]  1975-03-24  Date:04/24/20  Time:11:36 AM   Provider Performing:Pete E Tanja Port   Procedure: Insertion of Non-tunneled Central Venous [phone]) with US guidance ([id number])   Indication(s) Medication administration  Consent Risks of the procedure as well as the alternatives and risks of each were explained to the patient and/or caregiver.  Consent for the procedure was obtained and is signed in the bedside chart  Anesthesia Topical only with 1% lidocaine   Timeout Verified patient identification, verified procedure, site/side was marked, verified correct patient position, special equipment/implants available, medications/allergies/relevant history reviewed, required imaging and test results available.  Sterile Technique Maximal sterile technique including full sterile barrier drape, hand hygiene, sterile gown, sterile gloves, mask, hair covering, sterile ultrasound probe cover (if used).  Procedure Description Area of catheter insertion was cleaned with chlorhexidine and draped in sterile fashion.  With real-time ultrasound guidance a central venous catheter was placed into the right subclavian vein. Nonpulsatile blood flow and easy flushing noted in all ports.  The catheter was sutured in place and sterile dressing applied.  Complications/Tolerance None; patient tolerated the procedure well. Chest X-ray is ordered to verify placement for internal jugular or subclavian cannulation.   Chest x-ray is not ordered for femoral cannulation.  EBL Minimal  Specimen(s) None Simonne Martinet ACNP-BC [facility]monary/Critical Care Pager # [phone] OR # [phone] if no answer

## 2020-04-24 NOTE — Progress Notes (Signed)
Assisted tele visit to patient with family member.  Akirra Lacerda D Donney Caraveo, RN   

## 2020-04-24 NOTE — Progress Notes (Signed)
NAME:  Sandra [name], MRN:  [id number], DOB:  [birth year], LOS: 25 ADMISSION DATE:  04/02/2020,   Brief History:  This is a 45 year old female with COVID-19 pneumonia and ARDS  History of Present Illness:  Is a 45 year old unvaccinated white female who presented to the hospital on 04/14/2020 with Covid pneumonia.  Over the next 14 days she has developed Covid ARDS.  She progressed from high flow nasal cannula to BiPAP and then ultimately was intubated on 04/12/2020.  She has been treated with remdesivir steroids and baricitnib.  On 02/14 patient had a small segmental right lower lobe pulmonary embolism and was converted to full dose Lovenox.  Past Medical History:  Anxiety Arthritis Depression Fibromyalgia GERD Gastric ulceration Hiatal hernia POTS Hypertension Morbid obesity  Significant Hospital Events:  Intubation  Consults:  Critical care medicine  Procedures:  04/13/20 intubated 04/13/2020 triple-lumen catheter placed  Significant Diagnostic Tests:    Micro Data:  Covid + 04/05/2020  Antimicrobials:  Levaquin  Subjective   Still with ongoing sedation issues. Restarted paralysis yesterday. GPCs in blood.   Objective   Blood pressure (!) 135/54, pulse 85, temperature 98.78 F (37.1 C), temperature source Esophageal, resp. rate (!) 28, height 5\' 4"  (1.626 m), weight 117.9 kg, SpO2 90 %.    Vent Mode: PRVC FiO2 (%):  [80 %-100 %] 80 % Set Rate:  [28 bmp] 28 bmp Vt Set:  [400 mL] 400 mL PEEP:  [16 cmH20-18 cmH20] 18 cmH20 Plateau Pressure:  [31 cmH20-34 cmH20] 34 cmH20   Intake/Output Summary (Last 24 hours) at 04/24/2020 0815 Last data filed at 04/24/2020 0800 Gross per 24 hour  Intake 6261.91 ml  Output 3310 ml  Net 2951.91 ml   Filed Weights   04/22/20 0434 04/23/20 0500 04/24/20 0421  Weight: 116.1 kg 114.7 kg 117.9 kg    Examination: Constitutional: young FM, intubated on life support  HEENT: NCAT, ETT in place  Cardiovascular: RRR, s1 s2   Respiratory: bilateral mechanical ventilation  Gastrointestinal: bowel sounds present  Skin: no rash  Neurologic: on continuous sedation   Assessment & Plan:   Acute respiratory failure requiring intubation and mechanical ventilation Covid ARDS COVID- associated pulmonary embolsim Suspected pulmonary edema superimposed Covid pneumonia Morbid obesity Idiopathic intracranial hypertension s/p VP shunt POTS Chronic pain, fibromyalgia Enterococcal UTI- treated with levaquin Hyperglycemia Constipation- ongoing ABLA from nose bleeding- after proning, required packing x 2 2/22 Hypotension shock Bacteremia, GPCS X2   Continue mechanical ventilation per ARDS protocol Target TVol 6-8cc/kgIBW Target Plateau Pressure < 30cm H20 Target driving pressure less than 15 cm of water Target PaO2 55-65: titrate PEEP/FiO2 per protocol Remains on max sedation  Back on paralysis as of yesterday  Continue GOC with family Appreciate palliative care input  CXR as needed  Pull all CVL  Start Linezolid  Continue afrin and prn TXA for nose bleeding  Back on full dose lovenox  Overall prognosis is poor.   Best practice (evaluated daily)  Diet: TF pain/Anxiety/Delirium protocol (if indicated): fent/versed VAP protocol (if indicated): in place DVT prophylaxis: Full dose Lovenox GI prophylaxis: Protonix Glucose control: basal bolus as ordered Mobility: Bedrest Disposition: Intensive care unit  Goals of Care:   Code Status: Full Confirmed on 04/20/2020 Spoke with patient's daughter yesterday at bedside 04/22/2020 Spoke with fianc at bedside 04/23/2020   This patient is critically ill with multiple organ system failure; which, requires frequent high complexity decision making, assessment, support, evaluation, and titration of therapies. This was completed through the application of  advanced monitoring technologies and extensive interpretation of multiple databases. During this encounter  critical care time was devoted to patient care services described in this note for 32 minutes.  Josephine Igo, DO [facility] 04/24/2020 8:15 AM

## 2020-04-24 NOTE — Progress Notes (Signed)
Inpatient Diabetes Program Recommendations  AACE/ADA: New Consensus Statement on Inpatient Glycemic Control (2015)  Target Ranges:  Prepandial:   less than 140 mg/dL      Peak postprandial:   less than 180 mg/dL (1-2 hours)      Critically ill patients:  140 - 180 mg/dL   Lab Results  Component Value Date   GLUCAP 165 (H) 04/24/2020   HGBA1C 6.6 (H) 04/02/2020    Review of Glycemic Control Results for METTE, SOUTHGATE (MRN [id number]) as of 04/24/2020 09:00  Ref. Range 04/24/2020 01:08 04/24/2020 03:01 04/24/2020 05:04  Glucose-Capillary Latest Ref Range: 70 - 99 mg/dL 412 (H) 878 (H) 676 (H)   Diabetes history: No DM hx Outpatient Diabetes medications: none Current orders for Inpatient glycemic control: IV insulin Vital @ 60 ml/hr Solumedrol dcd today  Inpatient Diabetes Program Recommendations:    Noted addition of IV insulin. Assuming needs increased based on clinical picture. Based on current drip rates exceeding >10 units/hr, would continue IV insulin.   Thanks, Lujean Rave, MSN, RNC-OB Diabetes Coordinator [phone] (8a-5p)

## 2020-04-24 NOTE — Progress Notes (Signed)
Nutrition Follow-up   DOCUMENTATION CODES:   Morbid obesity  INTERVENTION:   Decrease bowel regimen? Having large stool output  Tube feeding:  -Pivot 1.5 @ 50 ml/hr via OG (1200 ml) -Prosource TF 90 ml TID -Free water 200 ml Q6 hours   Provides: 2040 kcals (2777 kcal with propofol), 147 grams protein, 900 ml free water (1700 ml with flushes)  NUTRITION DIAGNOSIS:   Increased nutrient needs related to catabolic illness (IHKVQ-25 infection) as evidenced by estimated needs   Ongoing  GOAL:   Provide needs based on ASPEN/SCCM guidelines   Addressed via TF  MONITOR:   Vent status,TF tolerance,Labs,Weight trends  REASON FOR ASSESSMENT:   Consult Enteral/tube feeding initiation and management  ASSESSMENT:   45 year old female admitted with acute respiratory failure with hypoxia secondary to COVID-19 pneumonia. Past medical history of GERD, vertigo, depression, anxiety, pseudomotor cerebri, benign intracranial hypertension with VP shunt presented with worsening SOB, cough, fatigue, and body aches since testing positive for Covid 7 days ago with home kit.   2/5- admitted [facility] 2/18- transferred to [facility] ICU, intubated 2/19- transferred to [facility]  Pt discussed during ICU rounds and with RN.   Back on propofol. Nimbex restarted. Bleeding from nares, bilateral nasal packing remains in place. Hypernatremia persist, may need increase FWF. Pivot 1.5 running at 60 ml/hr. Decrease to better meet needs with addition of propofol. Has strong bowel regimen in place, may be able to decrease given increased liquid stool. GOC continue to be discussed with family. Aggressive care for now.  Admission weight: 123.3 kg  Current weight: 117.9 kg   Patient remains intubated on ventilator support MV: 11.0 L/min Temp (24hrs), Avg:99.5 F (37.5 C), Min:98.06 F (36.7 C), Max:101.48 F (38.6 C)  Propofol: 27.9 ml/hr- provides 737 kcal from lipids daily   UOP: 2625 ml x 24 hrs  Stool: 650 ml x 24  hrs   Drips: nimbex, insulin, propofol Medications: 500 mg vitamin C, vitamin D, colace, miralax, sorbitol, zinc sulfate 220 mg Labs: Na 149 (H)- down from yesterday CBG 152-176  Diet Order:   Diet Order            Diet NPO time specified  Diet effective now                 EDUCATION NEEDS:   Not appropriate for education at this time  Skin:  Skin Assessment: Reviewed RN Assessment  Last BM:  3/1 via rectal tube  Height:   Ht Readings from Last 1 Encounters:  04/13/20 '5\' 4"'  (1.626 m)    Weight:   Wt Readings from Last 1 Encounters:  04/24/20 117.9 kg   Adjusted Body Weight: 81.1 kg   Estimated Nutritional Needs:   Kcal:  2352-2920 kcal   Protein:  145-160 grams   Fluid:  >/= 2 L/day  Sandra [name] RD, LDN Clinical Nutrition Pager listed in [place]

## 2020-04-24 DEATH — deceased

## 2020-04-25 DIAGNOSIS — N179 Acute kidney failure, unspecified: Secondary | ICD-10-CM | POA: Diagnosis not present

## 2020-04-25 DIAGNOSIS — J9601 Acute respiratory failure with hypoxia: Secondary | ICD-10-CM | POA: Diagnosis not present

## 2020-04-25 DIAGNOSIS — B957 Other staphylococcus as the cause of diseases classified elsewhere: Secondary | ICD-10-CM

## 2020-04-25 DIAGNOSIS — Z9911 Dependence on respirator [ventilator] status: Secondary | ICD-10-CM

## 2020-04-25 DIAGNOSIS — I2694 Multiple subsegmental pulmonary emboli without acute cor pulmonale: Secondary | ICD-10-CM

## 2020-04-25 DIAGNOSIS — R7881 Bacteremia: Secondary | ICD-10-CM

## 2020-04-25 DIAGNOSIS — J1282 Pneumonia due to coronavirus disease 2019: Secondary | ICD-10-CM | POA: Diagnosis not present

## 2020-04-25 DIAGNOSIS — Z515 Encounter for palliative care: Secondary | ICD-10-CM | POA: Diagnosis not present

## 2020-04-25 DIAGNOSIS — I498 Other specified cardiac arrhythmias: Secondary | ICD-10-CM

## 2020-04-25 DIAGNOSIS — U071 COVID-19: Secondary | ICD-10-CM | POA: Diagnosis not present

## 2020-04-25 DIAGNOSIS — J80 Acute respiratory distress syndrome: Secondary | ICD-10-CM

## 2020-04-25 LAB — POCT I-STAT 7, (LYTES, BLD GAS, ICA,H+H)
Acid-Base Excess: 11 mmol/L — ABNORMAL HIGH (ref 0.0–2.0)
Acid-Base Excess: 10 mmol/L — ABNORMAL HIGH (ref 0.0–2.0)
Bicarbonate: 38.7 mmol/L — ABNORMAL HIGH (ref 20.0–28.0)
Bicarbonate: 39.4 mmol/L — ABNORMAL HIGH (ref 20.0–28.0)
Calcium, Ion: 1.29 mmol/L (ref 1.15–1.40)
Calcium, Ion: 1.3 mmol/L (ref 1.15–1.40)
HCT: 19 % — ABNORMAL LOW (ref 36.0–46.0)
HCT: 27 % — ABNORMAL LOW (ref 36.0–46.0)
Hemoglobin: 6.5 g/dL — CL (ref 12.0–15.0)
Hemoglobin: 9.2 g/dL — ABNORMAL LOW (ref 12.0–15.0)
O2 Saturation: 85 %
O2 Saturation: 64 %
Patient temperature: 37.7
Patient temperature: 100
Potassium: 4 mmol/L (ref 3.5–5.1)
Potassium: 4.1 mmol/L (ref 3.5–5.1)
Sodium: 150 mmol/L — ABNORMAL HIGH (ref 135–145)
Sodium: 150 mmol/L — ABNORMAL HIGH (ref 135–145)
TCO2: 41 mmol/L — ABNORMAL HIGH (ref 22–32)
TCO2: 42 mmol/L — ABNORMAL HIGH (ref 22–32)
pCO2 arterial: 80.7 mmHg (ref 32.0–48.0)
pCO2 arterial: 88.3 mmHg (ref 32.0–48.0)
pH, Arterial: 7.292 — ABNORMAL LOW (ref 7.350–7.450)
pH, Arterial: 7.262 — ABNORMAL LOW (ref 7.350–7.450)
pO2, Arterial: 61 mmHg — ABNORMAL LOW (ref 83.0–108.0)
pO2, Arterial: 42 mmHg — ABNORMAL LOW (ref 83.0–108.0)

## 2020-04-25 LAB — CBC
HCT: 23.2 % — ABNORMAL LOW (ref 36.0–46.0)
Hemoglobin: 6.7 g/dL — CL (ref 12.0–15.0)
MCH: 29.9 pg (ref 26.0–34.0)
MCHC: 28.9 g/dL — ABNORMAL LOW (ref 30.0–36.0)
MCV: 103.6 fL — ABNORMAL HIGH (ref 80.0–100.0)
Platelets: 189 10*3/uL (ref 150–400)
RBC: 2.24 MIL/uL — ABNORMAL LOW (ref 3.87–5.11)
RDW: 17.8 % — ABNORMAL HIGH (ref 11.5–15.5)
WBC: 6.5 10*3/uL (ref 4.0–10.5)
nRBC: 3.1 % — ABNORMAL HIGH (ref 0.0–0.2)

## 2020-04-25 LAB — GLUCOSE, CAPILLARY
Glucose-Capillary: 162 mg/dL — ABNORMAL HIGH (ref 70–99)
Glucose-Capillary: 181 mg/dL — ABNORMAL HIGH (ref 70–99)
Glucose-Capillary: 173 mg/dL — ABNORMAL HIGH (ref 70–99)
Glucose-Capillary: 130 mg/dL — ABNORMAL HIGH (ref 70–99)
Glucose-Capillary: 154 mg/dL — ABNORMAL HIGH (ref 70–99)
Glucose-Capillary: 152 mg/dL — ABNORMAL HIGH (ref 70–99)
Glucose-Capillary: 176 mg/dL — ABNORMAL HIGH (ref 70–99)
Glucose-Capillary: 151 mg/dL — ABNORMAL HIGH (ref 70–99)
Glucose-Capillary: 162 mg/dL — ABNORMAL HIGH (ref 70–99)
Glucose-Capillary: 135 mg/dL — ABNORMAL HIGH (ref 70–99)
Glucose-Capillary: 145 mg/dL — ABNORMAL HIGH (ref 70–99)
Glucose-Capillary: 151 mg/dL — ABNORMAL HIGH (ref 70–99)
Glucose-Capillary: 171 mg/dL — ABNORMAL HIGH (ref 70–99)

## 2020-04-25 LAB — BASIC METABOLIC PANEL
Anion gap: 7 (ref 5–15)
BUN: 38 mg/dL — ABNORMAL HIGH (ref 6–20)
CO2: 35 mmol/L — ABNORMAL HIGH (ref 22–32)
Calcium: 8.9 mg/dL (ref 8.9–10.3)
Chloride: 109 mmol/L (ref 98–111)
Creatinine, Ser: 0.71 mg/dL (ref 0.44–1.00)
GFR, Estimated: >60 mL/min (ref >60)
Glucose, Bld: 204 mg/dL — ABNORMAL HIGH (ref 70–99)
Potassium: 4.1 mmol/L (ref 3.5–5.1)
Sodium: 151 mmol/L — ABNORMAL HIGH (ref 135–145)

## 2020-04-25 LAB — CULTURE, BLOOD (ROUTINE X 2): Special Requests: ADEQUATE

## 2020-04-25 LAB — CULTURE, RESPIRATORY W GRAM STAIN

## 2020-04-25 LAB — HEPARIN ANTI-XA: Heparin LMW: 0.9 IU/mL

## 2020-04-25 LAB — ABO/RH: ABO/RH(D): O POS

## 2020-04-25 LAB — CK: Total CK: 33 U/L — ABNORMAL LOW (ref 38–234)

## 2020-04-25 LAB — PREPARE RBC (CROSSMATCH)

## 2020-04-25 MED ORDER — SODIUM CHLORIDE 0.9 % IV SOLN
650.0000 mg | Freq: Every day | INTRAVENOUS | Status: DC
  Administered 2020-04-25 – 2020-04-26 (×2): 650 mg via INTRAVENOUS
  Filled 2020-04-25 (×3): qty 13

## 2020-04-25 MED ORDER — SODIUM CHLORIDE 0.9% IV SOLUTION: Freq: Once | INTRAVENOUS | Status: DC

## 2020-04-25 MED ORDER — SODIUM CHLORIDE 0.9 % IV SOLN: INTRAVENOUS | Status: DC | PRN

## 2020-04-25 NOTE — Progress Notes (Signed)
Inpatient Diabetes Program Recommendations  AACE/ADA: New Consensus Statement on Inpatient Glycemic Control (2015)  Target Ranges:  Prepandial:   less than 140 mg/dL      Peak postprandial:   less than 180 mg/dL (1-2 hours)      Critically ill patients:  140 - 180 mg/dL   Lab Results  Component Value Date   GLUCAP 152 (H) 04/25/2020   HGBA1C 6.6 (H) 04/02/2020    Review of Glycemic Control Results for [place], CARMACK (MRN [id number]) as of 04/25/2020 11:01  Ref. Range 04/25/2020 06:34 04/25/2020 08:05 04/25/2020 09:21 04/25/2020 10:26  Glucose-Capillary Latest Ref Range: 70 - 99 mg/dL 287 (H) 681 (H) 157 (H) 152 (H)   Diabetes history:No DM hx Outpatient Diabetes medications:none Current orders for Inpatient glycemic control:IV insulin Vital @ 60 ml/hr   Inpatient Diabetes Program Recommendations:  When ready to transition, could consider:   -Levemir 60 units two hours prior to discontinuation of IV insulin, then BID to follow -Novolog 8 units Q4H (to be stopped or paused if tube feeds are paused or stopped) -Novolog 0-20 units Q4H  Thanks, Lujean Rave, MSN, RNC-OB Diabetes Coordinator [phone] (8a-5p)

## 2020-04-25 NOTE — Progress Notes (Signed)
eLink Physician-Brief Progress Note Patient Name: Sandra [name] DOB: [birth year] MRN: [id number]   Date of Service  04/25/2020  HPI/Events of Note  Epistaxis - Nursing request ground team to place [employer].   eICU Interventions  Will ask PCCM ground team to evaluate the patient at bedside.      Intervention Category Major Interventions: Other:  Lenell Antu 04/25/2020, 10:27 PM

## 2020-04-25 NOTE — Consult Note (Signed)
[facility]    Date of Admission:  04-18-20     Total days of antibiotics 15  Linezolid 3/01 >> C  Ceftriaxone 2/22 >> 2/27 TTP ppx  Cefepime 2/27 >> 2/28  Vancomycin 2/27 >> 3/01             Reason for Consult: MRSE bacteremia     Referring Provider: Icard Primary Care Provider: Ignatius Specking, MD   Assessment: Sandra [name] is a 45 y.o. female admitted now nearly a month with progressively worsening ARDS and critical illness due to COVID-19 infection. She remains on very high support via mechanical vent and requiring heavy sedation with analgesics/benzos and paralytic gtt. Her course has been further complicated by Enterococcus UTI (treated with levaquin x 5d), pulmonary embolus, refractory nasal bleeding requiring packing and now persistent fevers as of 2/26. Her blood cultures have grown out methicillin resistant staphylococcus epidermidis from both sites drawn from her left hand. For her bacteremia thusfar she has received 5 days of treatment (vancomycin >> linezolid). Repeat blood cultures have been drawn today prior to our arrival to see her.   Seems to be a true bacteremia given higher burden of growth. We will have the micro lab perform susceptibilities on both sites to see if they match. May be a line related infection given her critical illness and prolonged need for invasive support devices. Her previous central line has been removed and new line placed yesterday; unable to do a formal line holiday given critical illness and vasopressor requirement. ?translocation from nasal packing. The complicating feature here is her ventriculoatrial shunt placement. The question is whether this is infected or not, how to prove it and what to do about it. Typically would need sampling from shunt but given her critical illness currently likely not a candidate for that at this time. Unable to assess currently for meningoencephalitis given she is chemically  paralyzed; doubt she can go for MRI of brain. It appears her shunt is MRI compatible, although I am not as familiar with this kind nor placement- would be helpful regarding work up to see if neurosurgery can offer guidance if her overall condition improves.   I think we are limited to treating through bacteremia to see if we can sterilize the blood until we can tell what her ARDS recovery looks like before we can figure out if her shunt is infected.  Would switch back to either vancomycin or daptomycin however for treating her bacteremia as either are preferred over linezolid.     Plan: 1. Change to daptomycin IV for treatment of bacteremia 2. Follow for clearance of blood     Principal Problem:   Coagulase negative Staphylococcus bacteremia Active Problems:   Morbid obesity (HCC)   GERD (gastroesophageal reflux disease)   Pseudotumor cerebri   POTS (postural orthostatic tachycardia syndrome)   Hypotension   Acute renal failure (HCC)   Headache, migraine   Acute respiratory disease due to COVID-19 virus   Thrombocytopenia (HCC)   Pneumonia due to COVID-19 virus   Acute respiratory failure with hypoxia Due to Covid Resp Infection   Pulmonary embolism (HCC)   Acute lower UTI--E Faecalis---Possible CAUTI   . sodium chloride   Intravenous Once  . alteplase  2 mg Intracatheter Once  . artificial tears  1 application Both Eyes Q8H  . vitamin C  500 mg Per Tube Daily  . azelastine  2 spray Each Nare Daily  .  cholecalciferol  25 mcg Per Tube Daily  . clonazePAM  2 mg Per Tube Q8H  . docusate  100 mg Per Tube BID  . enoxaparin (LOVENOX) injection  80 mg Subcutaneous Q12H  . feeding supplement (PROSource TF)  90 mL Per Tube TID  . free water  200 mL Per Tube Q6H  . HYDROmorphone  4 mg Per Tube Q6H  . mouth rinse  15 mL Mouth Rinse 10 times per day  . nystatin  5 mL Mouth/Throat QID  . oxymetazoline  1 spray Each Nare BID  . pantoprazole (PROTONIX) IV  40 mg Intravenous Daily  .  polyethylene glycol  17 g Per Tube Daily  . QUEtiapine  50 mg Per Tube BID  . sodium chloride flush  10-40 mL Intracatheter Q12H  . sorbitol  60 mL Per Tube Q0600  . zinc sulfate  220 mg Per Tube Daily    HPI: Sandra [name] is a 45 y.o. female admitted 2/4 with severe COVID-19 pneumonia.   We have been consulted to help with treatment and management of coagulase staphylococcal bacteremia in this critically ill patient. She was heavily sedated and history was only obtained through chart.   PMHx. GERD, vertigo, pseudotumor cerebri s/p [facility] shunt. She was admitted with severe hypoxia (73% sats on room air). Received Remdesivir, steroids and baricitinib and HHF oxygen. Unfortunately ARDS worsened requiring BiPAP >> intubation, proning and now high ventilatory support settings currently 90% FiO2, PEEP 16. 2/14 noted to have a small segmental RLL PE and was anticoagulated with Lovenox. She developed fevers a few times throughout her hospital course - treated for enterococcus faecalis in the urine with Levaquin 2/17 >> 2/21.  Ceftriaxone added 2/23 for TSS prophylaxis with nasal packing in place. On 2/26 she developed high fevers - blood cultures drawn and antimicrobials were expanded to Vancomycin + Cefepime. Blood growing out staphylococcus epidermidis in both sites (3/4 bottles). Cefepime has since been stopped and now on linezolid alone day 2 for treatment of her bacteremia.   She has a history of a left ventriculoatrial shunt placement (setting set to 5) with [facility]/06/2018 [facility] Shunt) for persistent intracranial hypertension and headaches.     Review of Systems: Review of Systems  Unable to perform ROS: Patient nonverbal    Past Medical History:  Diagnosis Date  . Anxiety   . Arthritis   . Chronic back pain   . Chronic migraine   . Constipation   . Depression   . Diabetes mellitus (HCC)    A1C 6.6% on 04/13/20  . Fibromyalgia   . GERD (gastroesophageal  reflux disease)   . History of bronchitis   . History of cardiac catheterization    Normal coronaries 2007  . History of gastric ulcer   . History of hiatal hernia   . History of pneumonia   . Hypertension   . Idiopathic intracranial hypertension   . POTS (postural orthostatic tachycardia syndrome)   . Seasonal allergies     Social History   Tobacco Use  . Smoking status: Never Smoker  . Smokeless tobacco: Never Used  Substance Use Topics  . Alcohol use: No    Alcohol/week: 0.0 standard drinks  . Drug use: No    Family History  Problem Relation Age of Onset  . Kidney cancer Mother   . Diabetes Mother   . Hypertension Mother   . Thyroid disease Mother   . Ulcers Father   . Coronary artery disease Father  Premature CAD  . Hypertension Father   . Prostate cancer Maternal Grandfather   . Pancreatic cancer Maternal Grandmother   . Lung cancer Paternal Grandmother   . Aortic aneurysm Other   . Colon cancer Neg Hx    Allergies  Allergen Reactions  . Ambien [Zolpidem Tartrate] Rash and Other (See Comments)    Pt states she cannot walk after taking this medication  . Penicillins Hives and Swelling    Has patient had a PCN reaction causing immediate rash, facial/tongue/throat swelling, SOB or lightheadedness with hypotension: Yes Has patient had a PCN reaction causing severe rash involving mucus membranes or skin necrosis: Yes Has patient had a PCN reaction occurring within the last 10 years: Yes If all of the above answers are "NO", then may proceed with Cephalosporin use. Pt had ceftriaxone and ceftazidime 01/2014 and 2019  . Topamax [Topiramate] Other (See Comments)    Gives her kidney stones  . Hydrocodone Itching  . Chlorhexidine Gluconate Itching and Rash    "It felt bees were stinging me"  . Latex Rash  . Versed [Midazolam] Itching    OBJECTIVE: Blood pressure (!) 138/53, pulse (!) 108, temperature 99.5 F (37.5 C), temperature source Esophageal, resp.  rate (!) 36, height 5\' 4"  (1.626 m), weight 119.6 kg, SpO2 94 %.  Physical Exam Vitals and nursing note reviewed.  Constitutional:      Appearance: She is obese. She is ill-appearing.  HENT:     Head: Normocephalic.     Comments: Unable to see L shunt incision  Eyes:     Comments: Pupils are equal bilaterally 38mm. Non-reactive on paralytics  Cardiovascular:     Rate and Rhythm: Regular rhythm. Tachycardia present.     Heart sounds: No murmur heard.   Pulmonary:     Effort: Pulmonary effort is normal.     Breath sounds: Decreased breath sounds present.     Comments: 90% FiO2, PEEP 16. No secretions noted with inline suctioning.  Chest:     Chest wall: No mass or deformity.     Comments: R chest central line in place. Clean and dry.  Abdominal:     Palpations: Abdomen is soft.  Musculoskeletal:     Right lower leg: Edema present.     Left lower leg: Edema present.  Skin:    General: Skin is warm.     Capillary Refill: Capillary refill takes less than 2 seconds.     Findings: No rash.  Neurological:     Comments: She is on high dose opioids/sedatives and paralytic gtt currently and intentionally unresponsive.      Lab Results Lab Results  Component Value Date   WBC 6.5 04/25/2020   HGB 6.5 (LL) 04/25/2020   HCT 19.0 (L) 04/25/2020   MCV 103.6 (H) 04/25/2020   PLT 189 04/25/2020    Lab Results  Component Value Date   CREATININE 0.71 04/25/2020   BUN 38 (H) 04/25/2020   NA 150 (H) 04/25/2020   K 4.0 04/25/2020   CL 109 04/25/2020   CO2 35 (H) 04/25/2020    Lab Results  Component Value Date   ALT 35 04/22/2020   AST 20 04/22/2020   ALKPHOS 85 04/22/2020   BILITOT 0.2 (L) 04/22/2020     Microbiology: BCx 2/27 >> + MRSE (susceptibilities pending on all bottles) BCx 3/02 >> pending    5/02, MSN, NP-C [facility] Cell: [phone] Pager: [phone]  04/25/2020 1:26 PM

## 2020-04-25 NOTE — Progress Notes (Signed)
NAME:  Parissa Chiao, MRN:  [id number], DOB:  December 03, 1975, LOS: 26 ADMISSION DATE:  03/31/2020,   Brief History:  This is a 45 year old female with COVID-19 pneumonia and ARDS  History of Present Illness:  Is a 45 year old unvaccinated white female who presented to the hospital on 04/19/2020 with Covid pneumonia.  Over the next 14 days she has developed Covid ARDS.  She progressed from high flow nasal cannula to BiPAP and then ultimately was intubated on 04/12/2020.  She has been treated with remdesivir steroids and baricitnib.  On 02/14 patient had a small segmental right lower lobe pulmonary embolism and was converted to full dose Lovenox.  Past Medical History:  Anxiety Arthritis Depression Fibromyalgia GERD Gastric ulceration Hiatal hernia POTS Hypertension Morbid obesity  Significant Hospital Events:  Intubation  04/24/2020: Infectious disease consulted, MRSE bacteremia, central venous line replaced, patient does have VP shunt.  Consults:  Critical care medicine  Procedures:  04/13/20 intubated 04/13/2020 triple-lumen catheter placed  Significant Diagnostic Tests:    Micro Data:  Covid + 04/20/2020  Antimicrobials:  Levaquin  Subjective   Central venous line removed yesterday.  Remains on antimicrobials.  Remains heavily sedated on mechanical support  Objective   Blood pressure (!) 116/48, pulse (!) 114, temperature 99.68 F (37.6 C), temperature source Esophageal, resp. rate (!) 28, height 5\' 4"  (1.626 m), weight 119.6 kg, SpO2 92 %.    Vent Mode: PRVC FiO2 (%):  [70 %-90 %] 90 % Set Rate:  [28 bmp] 28 bmp Vt Set:  [400 mL] 400 mL PEEP:  [18 cmH20] 18 cmH20 Plateau Pressure:  [31 cmH20-38 cmH20] 35 cmH20   Intake/Output Summary (Last 24 hours) at 04/25/2020 1041 Last data filed at 04/25/2020 0800 Gross per 24 hour  Intake 3818.62 ml  Output 3200 ml  Net 618.62 ml   Filed Weights   04/23/20 0500 04/24/20 0421 04/25/20 0500  Weight: 114.7 kg 117.9 kg  119.6 kg    Examination: Constitutional: Young female intubated on mechanical life support HEENT: NCAT, endotracheal tube in place, new right internal jugular vein catheter Cardiovascular: Regular rate rhythm, S1-S2 Respiratory: Bilateral mechanically ventilated breath sounds Gastrointestinal: Bowel sounds present Skin: No rash Neurologic: Remains on continuous sedation plus paralysis   Assessment & Plan:   Acute respiratory failure requiring intubation and mechanical ventilation Covid ARDS COVID- associated pulmonary embolism Suspected pulmonary edema superimposed Covid pneumonia Continue mechanical ventilation per ARDS protocol Target TVol 6-8cc/kgIBW Target Plateau Pressure < 30cm H20 Target driving pressure less than 15 cm of water Target PaO2 55-65: titrate PEEP/FiO2 per protocol Remains too unstable for consideration of tracheostomy tube Multiple sedatives plus paralysis  Gram-positive bacteremia, GPCS X2  Septic shock secondary to gram-positive bacteremia Plan CVL replaced yesterday MRSE, continue linezolid Repeat blood cultures to document clearance She does have a VP shunt will need to figure out what to do with this if in fact she is unable to document clearance of GPC bacteremia.  Morbid obesity Idiopathic intracranial hypertension s/p VP shunt POTS Chronic pain, fibromyalgia Enterococcal UTI- treated with levaquin Hyperglycemia Constipation- ongoing.  ABLA from nose bleeding- after proning, required packing x 2 2/22 Plan Continue to observe Transfusion x1 unit today  Goals of care Prognosis overall is poor  Best practice (evaluated daily)  Diet: TF pain/Anxiety/Delirium protocol (if indicated): fent/versed VAP protocol (if indicated): in place DVT prophylaxis: Full dose Lovenox GI prophylaxis: Protonix Glucose control: basal bolus as ordered Mobility: Bedrest Disposition: Intensive care unit  Goals of Care:   Code  Status: Full Confirmed on  04/20/2020 Spoke with patient's daughter yesterday at bedside 04/22/2020 Spoke with fianc at bedside 04/23/2020  This patient is critically ill with multiple organ system failure; which, requires frequent high complexity decision making, assessment, support, evaluation, and titration of therapies. This was completed through the application of advanced monitoring technologies and extensive interpretation of multiple databases. During this encounter critical care time was devoted to patient care services described in this note for 32 minutes.  Josephine Igo, DO [facility] [facility] 04/25/2020 10:42 AM

## 2020-04-25 NOTE — Progress Notes (Signed)
A-Line attempted by 2 RT's. Attempts were unsuccessful due to clotting. Dr. Tonia Brooms was made aware.

## 2020-04-25 NOTE — Progress Notes (Signed)
ANTICOAGULATION CONSULT NOTE - Follow Up Consult  Pharmacy Consult for enoxaprin Indication: pulmonary embolus  Allergies  Allergen Reactions  . Ambien [Zolpidem Tartrate] Rash and Other (See Comments)    Pt states she cannot walk after taking this medication  . Penicillins Hives and Swelling    Has patient had a PCN reaction causing immediate rash, facial/tongue/throat swelling, SOB or lightheadedness with hypotension: Yes Has patient had a PCN reaction causing severe rash involving mucus membranes or skin necrosis: Yes Has patient had a PCN reaction occurring within the last 10 years: Yes If all of the above answers are "NO", then may proceed with Cephalosporin use. Pt had ceftriaxone and ceftazidime 01/2014 and 2019  . Topamax [Topiramate] Other (See Comments)    Gives her kidney stones  . Hydrocodone Itching  . Chlorhexidine Gluconate Itching and Rash    "It felt bees were stinging me"  . Latex Rash  . Versed [Midazolam] Itching    Patient Measurements: Height: 5\' 4"  (162.6 cm) Weight: 119.6 kg (263 lb 10.7 oz) IBW/kg (Calculated) : 54.7  Vital Signs: Temp: 100.94 F (38.3 C) (03/02 0700) BP: 127/38 (03/02 0700) Pulse Rate: 122 (03/02 0700)  Labs: Recent Labs    04/22/20 1027 04/22/20 1629 04/23/20 0445 04/24/20 0413 04/25/20 0211 04/25/20 0438  HGB  --    < > 7.0* 7.0*  --  6.7*  HCT  --   --  24.0* 24.8*  --  23.2*  PLT  --   --  148* 166  --  189  HEPRLOWMOCWT 1.26  --  0.32  --  0.90  --   CREATININE  --    < > 0.82 0.76  --  0.71   < > = values in this interval not displayed.    Estimated Creatinine Clearance: 113.1 mL/min (by C-G formula based on SCr of 0.71 mg/dL).   Medical History: Past Medical History:  Diagnosis Date  . Anxiety   . Arthritis   . Chronic back pain   . Chronic migraine   . Constipation   . Depression   . Diabetes mellitus (HCC)    A1C 6.6% on 04/13/20  . Fibromyalgia   . GERD (gastroesophageal reflux disease)   . History  of bronchitis   . History of cardiac catheterization    Normal coronaries 2007  . History of gastric ulcer   . History of hiatal hernia   . History of pneumonia   . Hypertension   . Idiopathic intracranial hypertension   . POTS (postural orthostatic tachycardia syndrome)   . Seasonal allergies     Assessment: 44yof admitted with COVID pneumonia and found to have small PE on chest. Enoxaparin held 2/27 with elevated level and nasal bleeding. Enoxaparin level normalized 3/1, nasal bleeding improved so enoxaparin was resumed at lower dose.   Repeat LMWH level this morning is therapeutic at 0.9. H/H down, pRBCs given.  Goal of Therapy:  Anti-Xa level 0.6-1 units/ml 4hrs after LMWH dose given Monitor platelets by anticoagulation protocol: Yes   Plan:  Enoxaparin 80mg  SQ BID Watch CBC closely   3/27, PharmD, BCPS, [facility] Clinical Pharmacist [phone] Please check AMION for all [facility] Pharmacy numbers 04/25/2020

## 2020-04-25 NOTE — Progress Notes (Signed)
Patient ID: Sandra [name], female   DOB: [birth year], 45 y.o.   MRN: [id number]  Medical records reviewed.  This NP visited patient at the bedside as a follow up for palliative medicine  needs and emotional support.   45 y.o. female unvaccinated admitted on 04/05/2020 presenting  with Covid pneumonia. Over the next 14 days she has developed Covid ARDS. She progressed from high flow nasal cannula to BiPAP and then ultimately was intubated on 04/12/2020. She has been treated with remdesivir steroids and baricitnib. On 02/14 patient had a small segmental right lower lobe pulmonary embolism and was converted to full dose  Condition is tenuous with full medical supports. Remains too unstable for consideration of tracheostomy tube.    Multiple sedatives plus paralytic on board.   Concern for survival of hospitalization.    Spoke to daughter and father at bedside.  Emotional support offered.    Education offered on current medical situation and patient's fragility.  Questions and concerns addressed to the best of my ability.   Created space and opportunity for family to explore their thoughts and feeling regarding medical situation.    Family understand the seriousness of current medical situation.   Family is open to all offered and available medical  interventions to prolong life, they are hopeful for improvement .     Discussed with family  the importance of continued conversation with each other and the medical providers regarding overall plan of care and treatment options,  ensuring decisions are within the context of the patients values and GOCs.  Questions and concerns addressed   Discussed with bedside RN  Total time spent on the unit was 35 minutes  Greater than 50% of the time was spent in counseling and coordination of care  Lorinda Creed NP  Palliative Medicine Team Team Phone # [phone] Pager [phone]

## 2020-04-25 NOTE — Progress Notes (Signed)
eLink Physician-Brief Progress Note Patient Name: Sandra [name] DOB: [birth year] MRN: [id number]   Date of Service  04/25/2020  HPI/Events of Note  Anemia - Hgb = 6.7.  eICU Interventions  Will transfuse 1 unit PRBC now.      Intervention Category Major Interventions: Other:  Lenell Antu 04/25/2020, 5:43 AM

## 2020-04-26 ENCOUNTER — Inpatient Hospital Stay (HOSPITAL_COMMUNITY): Payer: Medicare Other

## 2020-04-26 DIAGNOSIS — J9601 Acute respiratory failure with hypoxia: Secondary | ICD-10-CM | POA: Diagnosis not present

## 2020-04-26 DIAGNOSIS — U071 COVID-19: Secondary | ICD-10-CM | POA: Diagnosis not present

## 2020-04-26 DIAGNOSIS — N179 Acute kidney failure, unspecified: Secondary | ICD-10-CM | POA: Diagnosis not present

## 2020-04-26 DIAGNOSIS — B957 Other staphylococcus as the cause of diseases classified elsewhere: Secondary | ICD-10-CM | POA: Diagnosis not present

## 2020-04-26 DIAGNOSIS — J80 Acute respiratory distress syndrome: Secondary | ICD-10-CM | POA: Diagnosis not present

## 2020-04-26 DIAGNOSIS — R7881 Bacteremia: Secondary | ICD-10-CM | POA: Diagnosis not present

## 2020-04-26 LAB — CBC
HCT: 25.5 % — ABNORMAL LOW (ref 36.0–46.0)
Hemoglobin: 7 g/dL — ABNORMAL LOW (ref 12.0–15.0)
MCH: 28.7 pg (ref 26.0–34.0)
MCHC: 27.5 g/dL — ABNORMAL LOW (ref 30.0–36.0)
MCV: 104.5 fL — ABNORMAL HIGH (ref 80.0–100.0)
Platelets: 186 10*3/uL (ref 150–400)
RBC: 2.44 MIL/uL — ABNORMAL LOW (ref 3.87–5.11)
RDW: 18.4 % — ABNORMAL HIGH (ref 11.5–15.5)
WBC: 6 10*3/uL (ref 4.0–10.5)
nRBC: 2.8 % — ABNORMAL HIGH (ref 0.0–0.2)

## 2020-04-26 LAB — TYPE AND SCREEN
ABO/RH(D): O POS
Antibody Screen: NEGATIVE
Unit division: 0

## 2020-04-26 LAB — BASIC METABOLIC PANEL
Anion gap: 6 (ref 5–15)
BUN: 35 mg/dL — ABNORMAL HIGH (ref 6–20)
CO2: 35 mmol/L — ABNORMAL HIGH (ref 22–32)
Calcium: 8.8 mg/dL — ABNORMAL LOW (ref 8.9–10.3)
Chloride: 109 mmol/L (ref 98–111)
Creatinine, Ser: 0.81 mg/dL (ref 0.44–1.00)
GFR, Estimated: >60 mL/min (ref >60)
Glucose, Bld: 167 mg/dL — ABNORMAL HIGH (ref 70–99)
Potassium: 3.9 mmol/L (ref 3.5–5.1)
Sodium: 150 mmol/L — ABNORMAL HIGH (ref 135–145)

## 2020-04-26 LAB — GLUCOSE, CAPILLARY
Glucose-Capillary: 122 mg/dL — ABNORMAL HIGH (ref 70–99)
Glucose-Capillary: 158 mg/dL — ABNORMAL HIGH (ref 70–99)
Glucose-Capillary: 164 mg/dL — ABNORMAL HIGH (ref 70–99)
Glucose-Capillary: 162 mg/dL — ABNORMAL HIGH (ref 70–99)
Glucose-Capillary: 140 mg/dL — ABNORMAL HIGH (ref 70–99)
Glucose-Capillary: 158 mg/dL — ABNORMAL HIGH (ref 70–99)
Glucose-Capillary: 140 mg/dL — ABNORMAL HIGH (ref 70–99)
Glucose-Capillary: 191 mg/dL — ABNORMAL HIGH (ref 70–99)
Glucose-Capillary: 138 mg/dL — ABNORMAL HIGH (ref 70–99)
Glucose-Capillary: 203 mg/dL — ABNORMAL HIGH (ref 70–99)

## 2020-04-26 LAB — CULTURE, BLOOD (ROUTINE X 2): Special Requests: ADEQUATE

## 2020-04-26 LAB — BPAM RBC
Blood Product Expiration Date: 202204012359
ISSUE DATE / TIME: 202203021026
Unit Type and Rh: 5100

## 2020-04-26 LAB — HEPATITIS B SURFACE ANTIGEN: Hepatitis B Surface Ag: NONREACTIVE

## 2020-04-26 LAB — TRIGLYCERIDES: Triglycerides: 335 mg/dL — ABNORMAL HIGH (ref <150)

## 2020-04-26 LAB — HEPATITIS C ANTIBODY: HCV Ab: NONREACTIVE

## 2020-04-26 MED ORDER — METHYLPREDNISOLONE SODIUM SUCC 125 MG IJ SOLR: 60.0000 mg | Freq: Three times a day (TID) | INTRAMUSCULAR | Status: DC

## 2020-04-26 MED ORDER — VASOPRESSIN 20 UNITS/100 ML INFUSION FOR SHOCK: 0.0000 [IU]/min | INTRAVENOUS | Status: DC

## 2020-04-26 MED ORDER — GLYCOPYRROLATE 0.2 MG/ML IJ SOLN
0.2000 mg | INTRAMUSCULAR | Status: DC | PRN
  Administered 2020-04-27: 0.2 mg via INTRAVENOUS
  Filled 2020-04-26 (×2): qty 1

## 2020-04-26 MED ORDER — FUROSEMIDE 10 MG/ML IJ SOLN
80.0000 mg | Freq: Once | INTRAMUSCULAR | Status: AC
  Administered 2020-04-26: 80 mg via INTRAVENOUS
  Filled 2020-04-26: qty 8

## 2020-04-26 MED ORDER — DEXTROSE 5 % IV SOLN: INTRAVENOUS | Status: DC

## 2020-04-26 MED ORDER — POLYVINYL ALCOHOL 1.4 % OP SOLN
1.0000 [drp] | Freq: Four times a day (QID) | OPHTHALMIC | Status: DC | PRN
  Filled 2020-04-26: qty 15

## 2020-04-26 MED ORDER — FUROSEMIDE 10 MG/ML IJ SOLN
4.0000 mg/h | INTRAVENOUS | Status: DC
  Administered 2020-04-26: 4 mg/h via INTRAVENOUS
  Filled 2020-04-26 (×2): qty 20

## 2020-04-26 MED ORDER — GLYCOPYRROLATE 0.2 MG/ML IJ SOLN: 0.2000 mg | INTRAMUSCULAR | Status: DC | PRN

## 2020-04-26 MED ORDER — TENECTEPLASE 50 MG IV KIT
50.0000 mg | PACK | INTRAVENOUS | Status: DC
  Filled 2020-04-26: qty 10

## 2020-04-26 MED ORDER — ACETAMINOPHEN 650 MG RE SUPP
650.0000 mg | Freq: Four times a day (QID) | RECTAL | Status: DC | PRN
  Administered 2020-04-26: 650 mg via RECTAL
  Filled 2020-04-26: qty 1

## 2020-04-26 MED ORDER — FLUOXETINE HCL 20 MG/5ML PO SOLN
40.0000 mg | Freq: Every day | ORAL | Status: DC
  Administered 2020-04-26: 40 mg
  Filled 2020-04-26: qty 10

## 2020-04-26 MED ORDER — FENTANYL CITRATE (PF) 100 MCG/2ML IJ SOLN: 50.0000 ug | INTRAMUSCULAR | Status: DC | PRN

## 2020-04-26 MED ORDER — ACETAMINOPHEN 325 MG PO TABS
650.0000 mg | ORAL_TABLET | Freq: Four times a day (QID) | ORAL | Status: DC | PRN
  Administered 2020-04-27: 650 mg via ORAL
  Filled 2020-04-26: qty 2

## 2020-04-26 MED ORDER — MIDAZOLAM HCL 2 MG/2ML IJ SOLN
1.0000 mg | INTRAMUSCULAR | Status: DC | PRN
  Filled 2020-04-26: qty 2

## 2020-04-26 MED ORDER — FENTANYL 2500MCG IN NS 250ML (10MCG/ML) PREMIX INFUSION
0.0000 ug/h | INTRAVENOUS | Status: DC
  Administered 2020-04-26: 50 ug/h via INTRAVENOUS
  Filled 2020-04-26: qty 250

## 2020-04-26 MED ORDER — MIDAZOLAM BOLUS VIA INFUSION (WITHDRAWAL LIFE SUSTAINING TX)
2.0000 mg | INTRAVENOUS | Status: DC | PRN
  Filled 2020-04-26: qty 2

## 2020-04-26 MED ORDER — SODIUM CHLORIDE 0.9 % IV SOLN: INTRAVENOUS | Status: DC

## 2020-04-26 MED ORDER — DIPHENHYDRAMINE HCL 50 MG/ML IJ SOLN: 25.0000 mg | INTRAMUSCULAR | Status: DC | PRN

## 2020-04-26 MED ORDER — GLYCOPYRROLATE 1 MG PO TABS
1.0000 mg | ORAL_TABLET | ORAL | Status: DC | PRN
  Filled 2020-04-26: qty 1

## 2020-04-26 MED ORDER — FENTANYL BOLUS VIA INFUSION
100.0000 ug | INTRAVENOUS | Status: DC | PRN
Start: 2020-04-26 — End: 2020-04-27
  Filled 2020-04-26: qty 100

## 2020-04-26 NOTE — Progress Notes (Signed)
NAME:  Sandra [name], MRN:  [id number], DOB:  12-29-1975, LOS: 27 ADMISSION DATE:  04/15/2020,   Brief History:  This is a 45 year old female with COVID-19 pneumonia and ARDS  History of Present Illness:  Is a 45 year old unvaccinated white female who presented to the hospital on 04/01/2020 with Covid pneumonia.  Over the next 14 days she has developed Covid ARDS.  She progressed from high flow nasal cannula to BiPAP and then ultimately was intubated on 04/12/2020.  She has been treated with remdesivir steroids and baricitnib.  On 02/14 patient had a small segmental right lower lobe pulmonary embolism and was converted to full dose Lovenox.  Past Medical History:  Anxiety Arthritis Depression Fibromyalgia GERD Gastric ulceration Hiatal hernia POTS Hypertension Morbid obesity  Significant Hospital Events:  Intubation  04/24/2020: Infectious disease consulted, MRSE bacteremia, central venous line replaced, patient does have VP shunt.  Consults:  Critical care medicine  Procedures:  04/13/20 intubated 04/13/2020 triple-lumen catheter placed  Significant Diagnostic Tests:    Micro Data:  Covid + 03/31/2020  Antimicrobials:  Levaquin  Subjective   Seen by ID. Still febrile. Switched to daptomycin.   Objective   Blood pressure (!) 120/51, pulse (!) 114, temperature (!) 101.3 F (38.5 C), temperature source Esophageal, resp. rate (!) 28, height 5\' 4"  (1.626 m), weight 123.1 kg, SpO2 95 %.    Vent Mode: PRVC FiO2 (%):  [90 %-100 %] 100 % Set Rate:  [28 bmp] 28 bmp Vt Set:  [400 mL] 400 mL PEEP:  [18 cmH20] 18 cmH20 Plateau Pressure:  [32 cmH20-38 cmH20] 38 cmH20   Intake/Output Summary (Last 24 hours) at 04/26/2020 06/26/2020 Last data filed at 04/26/2020 06/26/2020 Gross per 24 hour  Intake 3299.24 ml  Output 2475 ml  Net 824.24 ml   Filed Weights   04/24/20 0421 04/25/20 0500 04/26/20 0500  Weight: 117.9 kg 119.6 kg 123.1 kg    Examination: Constitutional: young fm,  intubated on life support HEENT: NCAT, ETT in place  Cardiovascular: RRR, s1 s2  Respiratory: BL vented breaths  Gastrointestinal: BS present Skin: no rash  Neurologic: sedated and paralyzed    Assessment & Plan:   Acute respiratory failure requiring intubation and mechanical ventilation Covid ARDS COVID- associated pulmonary embolism Suspected pulmonary edema superimposed Covid pneumonia Continue mechanical ventilation per ARDS protocol Target TVol 6-8cc/kgIBW Target Plateau Pressure < 30cm H20 Target driving pressure less than 15 cm of water Target PaO2 55-65: titrate PEEP/FiO2 per protocol Remains on heavy sedation and paralysis to maintain vent compliance and synchrony   Gram-positive bacteremia, GPCS X2  Septic shock secondary to gram-positive bacteremia VP shunt in place, at risk for seeding hardware  Plan Appreciate ID input  Agree not stable for TEE  Continue daptomycin   Morbid obesity Idiopathic intracranial hypertension s/p VP shunt POTS Chronic pain, fibromyalgia Enterococcal UTI- treated with levaquin Hyperglycemia Constipation- ongoing.  ABLA from nose bleeding: after proning, required packing x 2 2/22 Plan Follow H&H  Conservative transfusion threshold   Goals of care: Prognosis is poor   Best practice (evaluated daily)  Diet: TF pain/Anxiety/Delirium protocol (if indicated): fent/versed VAP protocol (if indicated): in place DVT prophylaxis: Full dose Lovenox GI prophylaxis: Protonix Glucose control: basal bolus as ordered Mobility: Bedrest Disposition: Intensive care unit  Goals of Care:   Code Status: Full Confirmed on 04/20/2020 Spoke with patient's daughter yesterday at bedside 04/25/2020  This patient is critically ill with multiple organ system failure; which, requires frequent high complexity decision making, assessment,  support, evaluation, and titration of therapies. This was completed through the application of advanced monitoring  technologies and extensive interpretation of multiple databases. During this encounter critical care time was devoted to patient care services described in this note for 32 minutes.  Josephine Igo, DO [place] [facility] 04/26/2020 9:42 AM

## 2020-04-26 NOTE — Progress Notes (Addendum)
PCCM:   Worsening hypoxemia. Additional lasix and start infusion  She is out 2.3L today  Empiric steroids  Attempt to prone.  I think she is high risk for having cardiac arrest  I will inform family that this will be done in attempt for salvage attempt at improvement of O2  I called and spoke with Torey patients daughter  ECHO with bubble study ordered  This patient is critically ill with multiple organ system failure; which, requires frequent high complexity decision making, assessment, support, evaluation, and titration of therapies. This was completed through the application of advanced monitoring technologies and extensive interpretation of multiple databases. During this encounter critical care time was devoted to patient care services described in this note for an additional 50 minutes.   Josephine Igo, DO [place] [facility] 04/26/2020 2:47 PM

## 2020-04-26 NOTE — Progress Notes (Signed)
Patient proned.  Vitals currently acceptable.  RT to monitor

## 2020-04-26 NOTE — Progress Notes (Signed)
PCCM:  I met with patients family at bedside.  We have done everything possible to support this patient.  I suspect she will pass soon.   DDNR Continue support until all family that want to visit have arrived.  Then transition to comfort care  Garner Nash, DO [facility] 04/26/2020 5:47 PM

## 2020-04-26 NOTE — Progress Notes (Addendum)
Updated 45   Daughter Sandra [name] at bedside. Discussed impending cardiac arrest with sustained SpO2 <50 this afternoon and 60-70 majority of the day.  Joined by Dr. Tonia Brooms as well as nursing staff.  Discussed futility of ACLS in event of cardiac arrest as truly all heroic measures to prevent arrest and improve oxygenation are occurring, without benefit. Patient is likely to die within hours regardless of ongoing interventions. Comfort care recommended by Dr. Tonia Brooms-- family agrees pt would not want to die on mechanical support.  Code Status: DNR Plan: liberalized visitation. Supinate. Comfort care. Nimbex off at 1729 __________________  CCM Brief Note  45 yo COVID ARDS bacteremia PE. Pt proned this afternoon. Hypoxia has unfortunately continued to worsen. Called daughter, Sandra [name]. Discussed concern pt is at high risk for sustaining cardiac arrest.  Sandra [name] is en route to hospital   D/w Dr. Tonia Brooms-- Ordering tnkase per Dr. Tonia Brooms-- d/w pharmacist and bedside RN  Adding vaso   Sandra Fass MSN, AGACNP-BC [facility]/Critical Care Medicine [phone] If no answer, [phone] 04/26/2020, 4:53 PM

## 2020-04-26 NOTE — Progress Notes (Signed)
   04/26/20 1755  Clinical Encounter Type  Visited With Patient and family together  Visit Type Spiritual support  Referral From Nurse  Consult/Referral To Chaplain  Spiritual Encounters  Spiritual Needs Emotional;Grief support  Stress Factors  Patient Stress Factors Major life changes  Family Stress Factors Major life changes  Chaplain received a page from the nurse on 2H. Family was at bed side supporting one another through this difficult time.  Chaplain offered support and gave the family there time with Ms. Stang.  Informed the family if they need me to return please have the nurse page for the Chaplain.    Chaplain Klein Willcox [place] [phone]

## 2020-04-26 NOTE — Progress Notes (Signed)
[facility]  Date of Admission:  04/07/2020      Total days of antibiotics 16  Daptomycin 3/02 >> current   Linezolid 3/01 >> 3/02             Ceftriaxone 2/22 >> 2/27 TTP ppx             Cefepime 2/27 >> 2/28             Vancomycin 2/27 >> 3/01                  ASSESSMENT: Sandra [name] continues to have persistent fevers. Oxygenation needs again increased overnight now on 100% FiO2 and 18 of PEEP and remains on heavy sedation to help with vent compliance/ARDS; no changes to vasopressor need (has ben 18-22 mcg Levophed the last 24 hours, possible slow down titration). She has methicillin resistant staphylococcus epidermidis in both sets of blood cultures now on Daptomycin IV for treatment. Repeated blood cultures pending from 3/02.  Unclear as to the cause of her bacteremia - VA shunt infection vs line related infection (not able to do formal holiday but exchanged central line) vs translocation from nasal packing.  Further work up of bacteremia precluded largely by her critical illness. If she improves with regards to vent and pressor support would consider MRI of brain and TEE to evaluate distal tip of shunt for any concern over material that could reflect vegetations as well as consultation with neurosurgery to help with further work up/care.   Rare yeast in sputum collection, no need to treat unless we find evidence of invasive disease / candidemia. No other organisms.     PLAN: 1. Continue daptomycin. 2. Weekly CK monitoring.  3. Follow pending micro.      Principal Problem:   Coagulase negative Staphylococcus bacteremia Active Problems:   Morbid obesity (HCC)   GERD (gastroesophageal reflux disease)   Pseudotumor cerebri   POTS (postural orthostatic tachycardia syndrome)   Hypotension   Acute renal failure (HCC)   Headache, migraine   Acute respiratory disease due to COVID-19 virus   Thrombocytopenia (HCC)   Pneumonia due to COVID-19  virus   Acute respiratory failure with hypoxia Due to Covid Resp Infection   Pulmonary embolism (HCC)   Acute lower UTI--E Faecalis---Possible CAUTI   ARDS (adult respiratory distress syndrome) (HCC)   On mechanically assisted ventilation (HCC)   . sodium chloride   Intravenous Once  . alteplase  2 mg Intracatheter Once  . artificial tears  1 application Both Eyes Q8H  . vitamin C  500 mg Per Tube Daily  . azelastine  2 spray Each Nare Daily  . cholecalciferol  25 mcg Per Tube Daily  . clonazePAM  2 mg Per Tube Q8H  . docusate  100 mg Per Tube BID  . enoxaparin (LOVENOX) injection  80 mg Subcutaneous Q12H  . feeding supplement (PROSource TF)  90 mL Per Tube TID  . FLUoxetine  40 mg Per Tube Daily  . free water  200 mL Per Tube Q6H  . HYDROmorphone  4 mg Per Tube Q6H  . mouth rinse  15 mL Mouth Rinse 10 times per day  . nystatin  5 mL Mouth/Throat QID  . oxymetazoline  1 spray Each Nare BID  . pantoprazole (PROTONIX) IV  40 mg Intravenous Daily  . polyethylene glycol  17 g Per Tube Daily  . QUEtiapine  50 mg Per Tube BID  .  sodium chloride flush  10-40 mL Intracatheter Q12H  . sorbitol  60 mL Per Tube Q0600  . zinc sulfate  220 mg Per Tube Daily    SUBJECTIVE: Intubated    Review of Systems: Review of Systems  Unable to perform ROS: Intubated    Allergies  Allergen Reactions  . Ambien [Zolpidem Tartrate] Rash and Other (See Comments)    Pt states she cannot walk after taking this medication  . Penicillins Hives and Swelling    Has patient had a PCN reaction causing immediate rash, facial/tongue/throat swelling, SOB or lightheadedness with hypotension: Yes Has patient had a PCN reaction causing severe rash involving mucus membranes or skin necrosis: Yes Has patient had a PCN reaction occurring within the last 10 years: Yes If all of the above answers are "NO", then may proceed with Cephalosporin use. Pt had ceftriaxone and ceftazidime 01/2014 and 2019  . Topamax  [Topiramate] Other (See Comments)    Gives her kidney stones  . Hydrocodone Itching  . Chlorhexidine Gluconate Itching and Rash    "It felt bees were stinging me"  . Latex Rash  . Versed [Midazolam] Itching    OBJECTIVE: Vitals:   04/26/20 1015 04/26/20 1030 04/26/20 1045 04/26/20 1110  BP: (!) 129/56 (!) 118/53 (!) 123/56 (!) 130/59  Pulse: (!) 120 (!) 123 (!) 129 (!) 139  Resp: (!) 28 (!) 28 (!) 28 (!) 28  Temp: (!) 101.48 F (38.6 C) (!) 101.3 F (38.5 C) (!) 101.3 F (38.5 C)   TempSrc:      SpO2: (!) 84% (!) 85% (!) 81% (!) 75%  Weight:      Height:       Body mass index is 46.58 kg/m.  Physical Exam Vitals and nursing note reviewed.  Constitutional:      General: She is not in acute distress.    Appearance: She is obese. She is ill-appearing.  HENT:     Head: Normocephalic.  Cardiovascular:     Rate and Rhythm: Regular rhythm. Tachycardia present.     Heart sounds: No murmur heard.   Pulmonary:     Effort: Pulmonary effort is normal.     Breath sounds: Decreased breath sounds present. No rhonchi.  Chest:     Comments: R [place] CL in place - clean and dry  Abdominal:     General: Bowel sounds are normal.     Palpations: Abdomen is soft.  Skin:    General: Skin is warm and dry.     Capillary Refill: Capillary refill takes less than 2 seconds.     Findings: No rash.  Neurological:     Comments: Chemically paralyzed      Lab Results Lab Results  Component Value Date   WBC 6.0 04/26/2020   HGB 7.0 (L) 04/26/2020   HCT 25.5 (L) 04/26/2020   MCV 104.5 (H) 04/26/2020   PLT 186 04/26/2020    Lab Results  Component Value Date   CREATININE 0.81 04/26/2020   BUN 35 (H) 04/26/2020   NA 150 (H) 04/26/2020   K 3.9 04/26/2020   CL 109 04/26/2020   CO2 35 (H) 04/26/2020    Lab Results  Component Value Date   ALT 35 04/22/2020   AST 20 04/22/2020   ALKPHOS 85 04/22/2020   BILITOT 0.2 (L) 04/22/2020     Microbiology: Recent Results (from the past 240  hour(s))  Culture, blood (Routine X 2) w Reflex to ID Panel     Status: Abnormal  Collection Time: 04/22/20 10:44 AM   Specimen: BLOOD  Result Value Ref Range Status   Specimen Description BLOOD BLOOD LEFT HAND  Final   Special Requests   Final    BOTTLES DRAWN AEROBIC AND ANAEROBIC Blood Culture adequate volume   Culture  Setup Time   Final    GRAM POSITIVE COCCI IN CLUSTERS IN BOTH AEROBIC AND ANAEROBIC BOTTLES CRITICAL RESULT CALLED TO, READ BACK BY AND VERIFIED WITH: PHARMD M. PHAM [id number] FCP Performed at [facility], [address], [place], [place] [address]    Culture STAPHYLOCOCCUS EPIDERMIDIS (A)  Final   Report Status 04/25/2020 FINAL  Final   Organism ID, Bacteria STAPHYLOCOCCUS EPIDERMIDIS  Final      Susceptibility   Staphylococcus epidermidis - MIC*    CIPROFLOXACIN >=8 RESISTANT Resistant     ERYTHROMYCIN >=8 RESISTANT Resistant     GENTAMICIN <=0.5 SENSITIVE Sensitive     OXACILLIN >=4 RESISTANT Resistant     TETRACYCLINE 2 SENSITIVE Sensitive     VANCOMYCIN 1 SENSITIVE Sensitive     TRIMETH/SULFA 80 RESISTANT Resistant     CLINDAMYCIN >=8 RESISTANT Resistant     RIFAMPIN <=0.5 SENSITIVE Sensitive     Inducible Clindamycin NEGATIVE Sensitive     * STAPHYLOCOCCUS EPIDERMIDIS  Blood Culture ID Panel (Reflexed)     Status: Abnormal   Collection Time: 04/22/20 10:44 AM  Result Value Ref Range Status   Enterococcus faecalis NOT DETECTED NOT DETECTED Final   Enterococcus Faecium NOT DETECTED NOT DETECTED Final   Listeria monocytogenes NOT DETECTED NOT DETECTED Final   Staphylococcus species DETECTED (A) NOT DETECTED Final    Comment: CRITICAL RESULT CALLED TO, READ BACK BY AND VERIFIED WITH: PHARMD M. PHAM [id number] FCP    Staphylococcus aureus (BCID) NOT DETECTED NOT DETECTED Final   Staphylococcus epidermidis DETECTED (A) NOT DETECTED Final    Comment: Methicillin (oxacillin) resistant coagulase negative staphylococcus. Possible blood culture  contaminant (unless isolated from more than one blood culture draw or clinical case suggests pathogenicity). No antibiotic treatment is indicated for blood  culture contaminants. CRITICAL RESULT CALLED TO, READ BACK BY AND VERIFIED WITH: PHARMD M. PHAM [id number] FCP    Staphylococcus lugdunensis NOT DETECTED NOT DETECTED Final   Streptococcus species NOT DETECTED NOT DETECTED Final   Streptococcus agalactiae NOT DETECTED NOT DETECTED Final   Streptococcus pneumoniae NOT DETECTED NOT DETECTED Final   Streptococcus pyogenes NOT DETECTED NOT DETECTED Final   A.calcoaceticus-baumannii NOT DETECTED NOT DETECTED Final   Bacteroides fragilis NOT DETECTED NOT DETECTED Final   Enterobacterales NOT DETECTED NOT DETECTED Final   Enterobacter cloacae complex NOT DETECTED NOT DETECTED Final   Escherichia coli NOT DETECTED NOT DETECTED Final   Klebsiella aerogenes NOT DETECTED NOT DETECTED Final   Klebsiella oxytoca NOT DETECTED NOT DETECTED Final   Klebsiella pneumoniae NOT DETECTED NOT DETECTED Final   Proteus species NOT DETECTED NOT DETECTED Final   Salmonella species NOT DETECTED NOT DETECTED Final   Serratia marcescens NOT DETECTED NOT DETECTED Final   Haemophilus influenzae NOT DETECTED NOT DETECTED Final   Neisseria meningitidis NOT DETECTED NOT DETECTED Final   Pseudomonas aeruginosa NOT DETECTED NOT DETECTED Final   Stenotrophomonas maltophilia NOT DETECTED NOT DETECTED Final   Candida albicans NOT DETECTED NOT DETECTED Final   Candida auris NOT DETECTED NOT DETECTED Final   Candida glabrata NOT DETECTED NOT DETECTED Final   Candida krusei NOT DETECTED NOT DETECTED Final   Candida parapsilosis NOT DETECTED NOT DETECTED  Final   Candida tropicalis NOT DETECTED NOT DETECTED Final   Cryptococcus neoformans/gattii NOT DETECTED NOT DETECTED Final   Methicillin resistance mecA/C DETECTED (A) NOT DETECTED Final    Comment: CRITICAL RESULT CALLED TO, READ BACK BY AND VERIFIED WITH: PHARMD M.  PHAM [id number] FCP Performed at [facility], [address] [place], [place] [address]   Culture, blood (Routine X 2) w Reflex to ID Panel     Status: Abnormal   Collection Time: 04/22/20 11:00 AM   Specimen: BLOOD  Result Value Ref Range Status   Specimen Description BLOOD BLOOD LEFT HAND  Final   Special Requests AEROBIC BOTTLE ONLY Blood Culture adequate volume  Final   Culture  Setup Time   Final    GRAM POSITIVE COCCI IN CLUSTERS AEROBIC BOTTLE ONLY CRITICAL VALUE NOTED.  VALUE IS CONSISTENT WITH PREVIOUSLY REPORTED AND CALLED VALUE. Performed at [facility], [address], [place], [place] [address]    Culture STAPHYLOCOCCUS EPIDERMIDIS (A)  Final   Report Status 04/26/2020 FINAL  Final   Organism ID, Bacteria STAPHYLOCOCCUS EPIDERMIDIS  Final      Susceptibility   Staphylococcus epidermidis - MIC*    CIPROFLOXACIN >=8 RESISTANT Resistant     ERYTHROMYCIN >=8 RESISTANT Resistant     GENTAMICIN <=0.5 SENSITIVE Sensitive     OXACILLIN >=4 RESISTANT Resistant     TETRACYCLINE 2 SENSITIVE Sensitive     VANCOMYCIN <=0.5 SENSITIVE Sensitive     TRIMETH/SULFA 80 RESISTANT Resistant     CLINDAMYCIN >=8 RESISTANT Resistant     RIFAMPIN <=0.5 SENSITIVE Sensitive     Inducible Clindamycin NEGATIVE Sensitive     * STAPHYLOCOCCUS EPIDERMIDIS  Culture, Respiratory w Gram Stain     Status: None   Collection Time: 04/22/20  9:12 PM   Specimen: Endotracheal; Respiratory  Result Value Ref Range Status   Specimen Description ENDOTRACHEAL  Final   Special Requests ASPIRATE  Final   Gram Stain   Final    ABUNDANT WBC PRESENT,BOTH PMN AND MONONUCLEAR RARE YEAST Performed at [facility], [address], [place], [place] [address]    Culture FEW CANDIDA ALBICANS  Final   Report Status 04/25/2020 FINAL  Final     Rexene Alberts, MSN, NP-C [facility]  [email or web address] Pager:  [phone] Office: [phone] [facility] Main Line: [phone]

## 2020-04-27 DIAGNOSIS — U071 COVID-19: Secondary | ICD-10-CM

## 2020-04-27 LAB — HEPATITIS B SURFACE ANTIBODY, QUANTITATIVE: Hep B S AB Quant (Post): <3.1 m[IU]/mL — ABNORMAL LOW (ref >9.9)

## 2020-04-30 LAB — CULTURE, BLOOD (ROUTINE X 2)
Culture: NO GROWTH
Culture: NO GROWTH
Special Requests: ADEQUATE
Special Requests: ADEQUATE

## 2020-05-25 NOTE — Progress Notes (Signed)
Wasted of dilaudid and of versed with Kari Baars, RN.

## 2020-05-25 NOTE — Progress Notes (Signed)
   2020/05/25 1214  Clinical Encounter Type  Visited With Family  Visit Type Death  Referral From Nurse  Consult/Referral To Chaplain  The chaplain responded and provided prayer, comfort, and presence with the family in the waiting area. The family's pastor, Fanny Skates  was also present. This note was prepared by Deneen Harts, M.Div..  For questions please contact by phone [phone].

## 2020-05-25 NOTE — Progress Notes (Signed)
NAME:  Sandra [name], MRN:  [id number], DOB:  [birth year], LOS: 28 ADMISSION DATE:  04/09/2020,   Brief History:  This is a 45 year old female with COVID-19 pneumonia and ARDS  History of Present Illness:  Is a 45 year old unvaccinated white female who presented to the hospital on 04/03/2020 with Covid pneumonia.  Over the next 14 days she has developed Covid ARDS.  She progressed from high flow nasal cannula to BiPAP and then ultimately was intubated on 04/12/2020.  She has been treated with remdesivir steroids and baricitnib.  On 02/14 patient had a small segmental right lower lobe pulmonary embolism and was converted to full dose Lovenox.  Past Medical History:  Anxiety Arthritis Depression Fibromyalgia GERD Gastric ulceration Hiatal hernia POTS Hypertension Morbid obesity  Significant Hospital Events:  Intubation  04/24/2020: Infectious disease consulted, MRSE bacteremia, central venous line replaced, patient does have VP shunt. 3/3 desaturated. Refractory hypoxia. Made DNR 3/4 not making urine. Long discussion w/ family at bedside. All now in agreement to transition to comfort. abx off. Pressors off. Working on decreasing vent support to ensure we can keep her comfortable. Family considering one-way extubation   Consults:  Critical care medicine  Procedures:  04/13/20 intubated 04/13/2020 triple-lumen catheter placed  Significant Diagnostic Tests:    Micro Data:  Covid + 04/14/2020  Antimicrobials:  Levaquin  Subjective   BP 70s.  Hypoxic.   Objective   Blood pressure (Abnormal) 133/109, pulse (Abnormal) 125, temperature (Abnormal) 103.6 F (39.8 C), temperature source Axillary, resp. rate (Abnormal) 28, height 5\' 4"  (1.626 m), weight 123.9 kg, SpO2 (Abnormal) 77 %.    Vent Mode: PRVC FiO2 (%):  [100 %] 100 % Set Rate:  [28 bmp] 28 bmp Vt Set:  [400 mL] 400 mL PEEP:  [18 cmH20] 18 cmH20 Plateau Pressure:  [31 cmH20-39 cmH20] 31 cmH20   Intake/Output  Summary (Last 24 hours) at 05-21-2020 0930 Last data filed at 05/21/20 0800 Gross per 24 hour  Intake 2564.51 ml  Output 1025 ml  Net 1539.51 ml   Filed Weights   04/25/20 0500 04/26/20 0500 May 21, 2020 0600  Weight: 119.6 kg 123.1 kg 123.9 kg    Examination: General unresponsive on vent HENT orally intubated pulm still on 100% FIO2 and 18 peep sats low 80s. Rapid RR dec bilaterally Card RRR tachy  Ext warm dry diffuse anasarca abd soft Neuro unresponsive    Assessment & Plan:   Acute respiratory failure requiring intubation and mechanical ventilation Covid ARDS COVID- associated pulmonary embolism Suspected pulmonary edema superimposed Covid pneumonia Gram-positive bacteremia, GPCS X2  Septic shock secondary to gram-positive bacteremia VP shunt in place, at risk for seeding hardware  Morbid obesity Idiopathic intracranial hypertension s/p VP shunt POTS Chronic pain, fibromyalgia Enterococcal UTI- treated with levaquin Hyperglycemia Constipation ABLA from nose bleeding Comfort care.   Discussion Made DNR last night. Off pressors. There was some misunderstanding about what family expectation was vs plan of care. They did not clearly understand we were transitioning to comfort but at this point after long bedside discussion they are mostly in agreement with this. Ultimately they know that Haylyn will not survive and that there is nothing more to offer. They are now in agreement with not adding any other support and are in agreement that there would be no utility in adding back antibiotics (this was a major source of distress initially). At this point they are discussing how we proceed w/ de-escalation of care so that Kaniah can pass in a peaceful and dignified  manor.   Plan Cont current focus on comfort care No abx No pressors No escalation Family discussing step-wise approach where we first reduce vent support to ensure we keep her comfortable prior to making the next  step which will likely be extubation.  I will circle back and continue to provide further family support.   My time was 45 minutes.   Simonne Martinet ACNP-BC [facility] Pulmonary/Critical Care Pager # [phone] OR # [phone] if no answer    Best practice (evaluated daily)  Diet: TF-->off pain/Anxiety/Delirium protocol (if indicated): fent/versed VAP protocol (if indicated): in place DVT prophylaxis: Full dose Lovenox GI prophylaxis: Protonix Glucose control: basal bolus as ordered Mobility: Bedrest Disposition: Intensive care unit  Simonne Martinet ACNP-BC [facility] Pulmonary/Critical Care Pager # [phone] OR # [phone] if no answer

## 2020-05-25 NOTE — Death Summary Note (Signed)
DEATH SUMMARY   Patient Details  Name: Sandra [name] MRN: [id number] DOB: Jun 13, 1975  Admission/Discharge Information   Admit Date:  2020/04/12  Date of Death: Date of Death: 10-May-2020  Time of Death: Time of Death: 06-25-1211  Length of Stay: 07-05-22  Referring Physician: Ignatius Specking, MD   Reason(s) for Hospitalization  ARDS due to COVID-19 pneumonia.  Diagnoses  Preliminary cause of death:  Secondary Diagnoses (including complications and co-morbidities):  Principal Problem:   Coagulase negative Staphylococcus bacteremia Active Problems:   Morbid obesity (HCC)   GERD (gastroesophageal reflux disease)   Pseudotumor cerebri   POTS (postural orthostatic tachycardia syndrome)   Hypotension   Acute renal failure (HCC)   Headache, migraine   Acute respiratory disease due to COVID-19 virus   Thrombocytopenia (HCC)   Pneumonia due to COVID-19 virus   Acute respiratory failure with hypoxia Due to Covid Resp Infection   Pulmonary embolism (HCC)   Acute lower UTI--E Faecalis---Possible CAUTI   ARDS (adult respiratory distress syndrome) (HCC)   On mechanically assisted ventilation [facility])   Brief Hospital Course (including significant findings, care, treatment, and services provided and events leading to death)  Sandra [name] is a 45 y.o. year old female who was unvaccinated.  She presented with symptoms of shortness of breath and worsened over the next 14 days from requiring heated high flow nasal cannula to BiPAP and eventually intubation.  She was treated with remdesivir, steroids and baricitinib.  She had a small PE treated with Lovenox.  Despite 2 weeks of mechanical ventilation she remained in severe ARDS with high airway pressures and PEEP/FiO2 requirements.  She continue require prone positioning.  She then developed renal failure.  Dr. Tonia Brooms discussed the patient's situation with her family and it was presented to them that this point she had no further room to maneuver from a  ventilator standpoint and that she was on the point of requiring dialysis.  It was at this time it was decided to make her DNR and then transition to comfort care.  Pertinent Labs and Studies  Significant Diagnostic Studies DG Chest 1 View  Result Date: 04/23/2020 CLINICAL DATA:  Recent diagnosis of COVID-19. EXAM: CHEST  1 VIEW COMPARISON:  04/22/2020; 04/21/2020; 04/19/2020 chest CT-04/09/2020 FINDINGS: Grossly unchanged cardiac silhouette and mediastinal contours. Stable position of support apparatus. No pneumothorax. No change to slight improved aeration of the lungs with persistent bilateral mid and lower lung interstitial and airspace opacities. Trace bilateral effusions are not excluded however note, examination degraded secondary to exclusion of the left hemidiaphragm. No acute osseous abnormalities. IMPRESSION: 1. Stable positioning of support apparatus. No pneumothorax. 2. Slight improved aeration of the lungs with persistent bilateral mid and lower lung opacities, nonspecific though again worrisome for multifocal infection. Electronically Signed   By: Simonne Come M.D.   On: 04/23/2020 07:44   DG Chest 1 View  Result Date: 04/19/2020 CLINICAL DATA:  COVID positive, intubation EXAM: CHEST  1 VIEW COMPARISON:  Radiograph 04/18/2020 FINDINGS: Endotracheal tube tip terminates in the mid trachea, 3.5 cm from the carina. Transesophageal tube coiling over the gastric bubble with the tip below the margins of imaging, beyond the GE junction. Left IJ approach central venous catheter tip terminates in the mid to lower SVC. Right IJ approach central venous catheter terminates in the mid SVC. Telemetry leads overlie the chest. Persistent bilateral perihilar and basilar predominant interstitial and airspace opacities with more coalescent density in the retrocardiac space and bilateral air bronchograms. Suspect trace effusions bilaterally.  No pneumothorax. Stable cardiomediastinal contours. No acute osseous  or soft tissue abnormality. IMPRESSION: 1. Persistent bilateral perihilar and basilar predominant interstitial and airspace opacities with more coalescent density in the retrocardiac space and bilateral air bronchograms. 2. Suspect trace bilateral effusions. 3. Lines and tubes as above. Electronically Signed   By: Kreg Shropshire M.D.   On: 04/19/2020 06:52   DG Chest 1 View  Result Date: 04/18/2020 CLINICAL DATA:  Hypoxia.  COVID-19 positive EXAM: CHEST  1 VIEW COMPARISON:  April 17, 2020 FINDINGS: Endotracheal tube tip is 3.5 cm above the carina. Nasogastric tube tip and side port are below the diaphragm. Central catheter tip is in the superior vena cava. No pneumothorax. There is persistent patchy airspace opacity throughout the mid and lower lung regions with consolidation in portions of the left lower lobe. There is an equivocal left pleural effusion. Heart size and pulmonary vascularity are normal. No adenopathy. No bone lesions. IMPRESSION: Tube and catheter positions as described without pneumothorax. Airspace opacity, primarily in the mid and lower lung regions with consolidation in the left base region. Appearance is consistent with known atypical organism pneumonia. Bacterial superinfection in the left lower lobe with consolidation is quite possible. Equivocal left pleural effusion. Stable cardiac silhouette. Electronically Signed   By: Bretta Bang III M.D.   On: 04/18/2020 08:05   DG Chest 1 View  Result Date: 04/16/2020 CLINICAL DATA:  ARDS.  COVID-19 infection. EXAM: CHEST  1 VIEW COMPARISON:  Chest x-ray dated April 14, 2020. FINDINGS: The patient is rotated to the left. Endotracheal tube tip 1.0 cm above the carina. Unchanged enteric tube, right internal jugular central venous catheter, and left internal jugular ventriculoatrial shunt. Stable cardiomediastinal silhouette. No significant interval change and bilateral pulmonary interstitial opacities and left lower lobe consolidation.  No pneumothorax or pleural effusion. No acute osseous abnormality. IMPRESSION: 1. Relatively unchanged diffuse bilateral pulmonary airspace disease. Electronically Signed   By: Obie Dredge M.D.   On: 04/16/2020 10:08   DG Abd 1 View  Result Date: 04/18/2020 CLINICAL DATA:  OG tube inserted, check position EXAM: ABDOMEN - 1 VIEW COMPARISON:  04/14/2020 FINDINGS: The nasogastric tube tip and side port are well below the GE junction. The tip of the tube is in the expected location of the gastric body. More proximally the NG tube forms a loop within the gastric fundus. No dilated bowel loops. Bilateral lower lobe pulmonary opacities are again noted. IMPRESSION: Tip of the nasogastric tube is in the expected location of the gastric body. Electronically Signed   By: Signa Kell M.D.   On: 04/18/2020 14:54   CT ANGIO CHEST PE W OR WO CONTRAST  Result Date: 04/09/2020 CLINICAL DATA:  Acute hypoxic respiratory failure, COVID-19 positive EXAM: CT ANGIOGRAPHY CHEST WITH CONTRAST TECHNIQUE: Multidetector CT imaging of the chest was performed using the standard protocol during bolus administration of intravenous contrast. Multiplanar CT image reconstructions and MIPs were obtained to evaluate the vascular anatomy. CONTRAST:  OMNIPAQUE IOHEXOL 350 MG/ML SOLN COMPARISON:  04/07/2020 FINDINGS: Cardiovascular: This is a technically adequate evaluation of the pulmonary vasculature. Filling defects are seen within the right lower lobe segmental pulmonary arteries, consistent with acute pulmonary emboli. Overall, clot burden is minimal, with no evidence of right ventricular dilatation or right heart strain. The heart is unremarkable without pericardial effusion. No evidence of thoracic aortic aneurysm or dissection. Left-sided central venous catheter tip within the superior vena cava. Mediastinum/Nodes: No enlarged mediastinal, hilar, or axillary lymph nodes. Thyroid gland,  trachea, and esophagus demonstrate no  significant findings. Lungs/Pleura: There has been interval progression of bilateral ground-glass airspace disease, now with both lungs diffusely involved. This could reflect acute worsening of known COVID-19 pneumonia, developing ARDS, or superimposed edema. No effusion or pneumothorax. Central airways are patent. Upper Abdomen: No acute abnormality. Musculoskeletal: No acute or destructive bony lesions. Reconstructed images demonstrate no additional findings. Review of the MIP images confirms the above findings. IMPRESSION: 1. Segmental right lower lobe pulmonary emboli. No evidence of right heart strain. 2. Interval progression of bilateral ground-glass airspace disease, now diffusely involving the bilateral lungs. This could reflect acute worsening of known COVID-19 pneumonia, developing ARDS, or superimposed edema. Critical Value/emergent results were called by telephone at the time of interpretation on 04/09/2020 at 5:08 pm to provider [facility] , who verbally acknowledged these results. Electronically Signed   By: Sharlet Salina M.D.   On: 04/09/2020 17:09   US Venous Img Lower Bilateral (DVT)  Result Date: 04/07/2020 CLINICAL DATA:  45 year old female with COVID and breathing issues. Evaluate for DVT. EXAM: BILATERAL LOWER EXTREMITY VENOUS DOPPLER ULTRASOUND TECHNIQUE: Gray-scale sonography with graded compression, as well as color Doppler and duplex ultrasound were performed to evaluate the lower extremity deep venous systems from the level of the common femoral vein and including the common femoral, femoral, profunda femoral, popliteal and calf veins including the posterior tibial, peroneal and gastrocnemius veins when visible. The superficial great saphenous vein was also interrogated. Spectral Doppler was utilized to evaluate flow at rest and with distal augmentation maneuvers in the common femoral, femoral and popliteal veins. COMPARISON:  None. FINDINGS: RIGHT LOWER EXTREMITY Common Femoral  Vein: No evidence of thrombus. Normal compressibility, respiratory phasicity and response to augmentation. Saphenofemoral Junction: No evidence of thrombus. Normal compressibility and flow on color Doppler imaging. Profunda Femoral Vein: No evidence of thrombus. Normal compressibility and flow on color Doppler imaging. Femoral Vein: No evidence of thrombus. Normal compressibility, respiratory phasicity and response to augmentation. Popliteal Vein: No evidence of thrombus. Normal compressibility, respiratory phasicity and response to augmentation. Calf Veins: No evidence of thrombus. Normal compressibility and flow on color Doppler imaging. Superficial Great Saphenous Vein: No evidence of thrombus. Normal compressibility. Venous Reflux:  None. Other Findings:  None. LEFT LOWER EXTREMITY Common Femoral Vein: No evidence of thrombus. Normal compressibility, respiratory phasicity and response to augmentation. Saphenofemoral Junction: No evidence of thrombus. Normal compressibility and flow on color Doppler imaging. Profunda Femoral Vein: No evidence of thrombus. Normal compressibility and flow on color Doppler imaging. Femoral Vein: No evidence of thrombus. Normal compressibility, respiratory phasicity and response to augmentation. Popliteal Vein: No evidence of thrombus. Normal compressibility, respiratory phasicity and response to augmentation. Calf Veins: No evidence of thrombus. Normal compressibility and flow on color Doppler imaging. Superficial Great Saphenous Vein: No evidence of thrombus. Normal compressibility. Venous Reflux:  None. Other Findings:  None. IMPRESSION: No evidence of deep venous thrombosis in either lower extremity. Electronically Signed   By: Malachy Moan M.D.   On: 04/07/2020 12:05   DG CHEST PORT 1 VIEW  Result Date: 04/24/2020 CLINICAL DATA:  Central line placement, intubation, recent upper COVID-19 EXAM: PORTABLE CHEST 1 VIEW COMPARISON:  Portable exam 1134 hours compared to  04/23/2020 FINDINGS: Tip of endotracheal tube projects 5.6 cm above carina. Nasogastric tube extends into stomach. BILATERAL jugular lines with tips projecting over SVC. New RIGHT subclavian central venous catheter with tip projecting over SVC. Stable heart size and mediastinal contours for mild rotation to the LEFT. Bibasilar  infiltrates unchanged. No pleural effusion or pneumothorax. IMPRESSION: No pneumothorax following RIGHT subclavian line placement. Persistent bibasilar infiltrates. Electronically Signed   By: Ulyses SouthwardMark  Boles M.D.   On: 04/24/2020 11:48   DG CHEST PORT 1 VIEW  Result Date: 04/22/2020 CLINICAL DATA:  COVID positive, hypoxia. EXAM: PORTABLE CHEST 1 VIEW COMPARISON:  Same day chest radiograph at 0331 hours FINDINGS: Bilateral internal jugular central venous catheters with tips overlying the SVC. Endotracheal tube with tip overlying the midthoracic trachea. Enteric tube looped in the stomach with tip obscured by collimation. The heart size and mediastinal contours are unchanged. Low lung volumes with increased diffuse bilateral opacities. Probable small bilateral pleural effusions. The visualized skeletal structures are unchanged. IMPRESSION: Increased diffuse bilateral pulmonary opacities consistent with worsening pneumonia. Electronically Signed   By: Maudry MayhewJeffrey  Waltz MD   On: 04/22/2020 13:36   DG CHEST PORT 1 VIEW  Result Date: 04/22/2020 CLINICAL DATA:  Pneumonia EXAM: PORTABLE CHEST 1 VIEW COMPARISON:  04/21/2020 FINDINGS: Endotracheal tube seen 3.8 cm above the carina. Nasogastric tube extends into the upper abdomen. Right internal jugular central venous catheter tip noted within the superior vena cava. Left internal jugular ventriculoatrial shunt catheter tubing noted within the superior vena cava. Pulmonary insufflation is stable. Superimposed bilateral perihilar and lower lung zone interstitial and airspace pulmonary infiltrate appears stable. No pneumothorax or pleural effusion.  Cardiac size within normal limits. IMPRESSION: Stable support lines and tubes. Stable perihilar and bibasilar pulmonary infiltrates, infection versus edema. Electronically Signed   By: Helyn NumbersAshesh  Parikh MD   On: 04/22/2020 05:45   DG CHEST PORT 1 VIEW  Result Date: 04/21/2020 CLINICAL DATA:  Ventilated patient. EXAM: PORTABLE CHEST 1 VIEW COMPARISON:  04/20/2020 FINDINGS: Endotracheal tube is 3.3 cm above the carina. Right jugular central line in the SVC region. Nasogastric tube extends into the abdomen but the tip is beyond the image. Patchy densities at the left lung base are unchanged. Stable hazy densities at the right lung base. Lung findings are unchanged. Left jugular central line terminates near the upper SVC and stable. Negative for pneumothorax. Heart size is within normal limits and stable. Hazy densities in the perihilar regions are unchanged. IMPRESSION: 1. Stable position of the support apparatuses. 2. No significant change in the bilateral lung densities, most prominent at the left lung base. Electronically Signed   By: Richarda OverlieAdam  Henn M.D.   On: 04/21/2020 08:13   DG CHEST PORT 1 VIEW  Result Date: 04/20/2020 CLINICAL DATA:  COVID-19 positive, on ventilator EXAM: PORTABLE CHEST 1 VIEW COMPARISON:  Portable exam 1515 hours compared to 04/19/2020 FINDINGS: Tip of endotracheal tube projects 3.5 cm above carina. Nasogastric tube coiled in stomach. BILATERAL jugular lines unchanged with tips projecting over SVC. Normal heart size mediastinal contours. Diffuse BILATERAL pulmonary infiltrates consistent with multifocal pneumonia. No pleural effusion or pneumothorax. IMPRESSION: Diffuse BILATERAL pulmonary infiltrates consistent with multifocal pneumonia, little changed. Electronically Signed   By: Ulyses SouthwardMark  Boles M.D.   On: 04/20/2020 16:49   DG Chest Port 1 View  Addendum Date: 04/17/2020   ADDENDUM REPORT: 04/17/2020 06:51 ADDENDUM: These results will be called to the ordering clinician or representative  by the Radiologist Assistant, and communication documented in the PACS or [employer] Dashboard. Electronically Signed   By: Kreg ShropshirePrice  DeHay M.D.   On: 04/17/2020 06:51   Result Date: 04/17/2020 CLINICAL DATA:  ETT present, COVID positive EXAM: PORTABLE CHEST 1 VIEW COMPARISON:  Radiograph 04/16/2020 FINDINGS: Endotracheal tube in low position, less than 1 cm from the  carina. Recommend retracting 3 cm to position in the mid trachea. Transesophageal tube tip and side port terminate below the margins of imaging, beyond the GE junction. Right IJ approach central venous catheter tip terminates in the mid to lower SVC. Telemetry leads and external support devices overlie the chest. Some persistent hazy basilar predominant opacities in both lungs with more coalescent density in the left lung base which could reflect further airspace disease or layering pleural fluid. Portions of the cardiomediastinal contours are obscured by overlying opacity though visible contours are not significantly changed from prior accounting for differences in technique. No acute osseous or soft tissue abnormality. Telemetry leads overlie the chest. IMPRESSION: 1. Endotracheal tube in low position, less than 1 cm from the carina. Recommend retracting 3 cm to position in the mid trachea. 2. Persistent bilateral hazy basilar predominant opacities with more coalescent density in the left lung base which could reflect further airspace disease, atelectasis or layering pleural fluid. Electronically Signed: By: Kreg Shropshire M.D. On: 04/17/2020 06:45   DG Chest Port 1 View  Result Date: 04/14/2020 CLINICAL DATA:  45 year old female with COVID-19. Status post NG placement. EXAM: PORTABLE ABDOMEN - 1 VIEW; PORTABLE CHEST - 1 VIEW COMPARISON:  Chest radiograph dated 04/13/2020. FINDINGS: Endotracheal tube with tip tip approximately 2.5 cm above the carina. Right and left IJ central venous lungs with tips over central SVC. Enteric tube extends below the  diaphragm with tip in the distal stomach. No significant interval change in bilateral pulmonary opacities compared to prior radiograph. No pneumothorax. No large pleural effusion. Stable cardiomediastinal silhouette. No acute osseous pathology. Right upper quadrant cholecystectomy clips. IMPRESSION: Enteric tube with tip in the distal stomach. No other interval change. Electronically Signed   By: Elgie Collard M.D.   On: 04/14/2020 22:39   DG Chest Port 1 View  Result Date: 04/14/2020 CLINICAL DATA:  NG and endotracheal tube placement EXAM: PORTABLE CHEST 1 VIEW COMPARISON:  Radiograph 04/13/2020 FINDINGS: Endotracheal tube tip terminates low within the trachea, 1.9 cm from the carina. Right IJ catheter tip terminates in the mid SVC. A left IJ approach central venous catheter tip projects a near the superior cavoatrial junction. A transesophageal tube tip terminates in the left upper quadrant likely at the distal gastric body. Telemetry leads overlie the chest. Prior lumbar fusion hardware, incompletely assessed. Multifocal consolidative and interstitial opacities are present throughout both lungs most coalescent in the left lung base. No visible pneumothorax. Obscuration of left hemidiaphragm may be related to adjacent consolidation or trace effusion. No visible or suspected right effusion. Cardiomediastinal contours are partially obscured by overlying opacity. Visible cardiomediastinal contours are unchanged from priors. No high-grade obstructive bowel gas pattern is seen. Please note the abdomen is incompletely imaged on this exam. No acute osseous or soft tissue abnormality. IMPRESSION: 1. Endotracheal tube tip terminates low within the trachea, 1.9 cm from the carina. 2. Transesophageal tube tip terminates in the gastric body with side port distal to the GE junction. 3. Right IJ catheter tip terminates in the mid SVC. 4. Left IJ approach central venous catheter tip projects near the superior cavoatrial  junction. 5. Multifocal consolidative and interstitial opacities throughout both lungs, most coalescent in the left lung base. Similar to priors. Electronically Signed   By: Kreg Shropshire M.D.   On: 04/14/2020 00:08   Portable Chest x-ray  Result Date: 04/13/2020 CLINICAL DATA:  Intubated, history of COVID-19 pneumonia EXAM: PORTABLE CHEST 1 VIEW COMPARISON:  04/11/2020 FINDINGS: Three frontal views of  the chest demonstrate endotracheal tube overlying tracheal air column, tip 1.8 cm above carina. Enteric catheter passes below diaphragm, tip and side port projecting over gastric fundus. Bilateral internal jugular catheters overlying the brachiocephalic confluence. Cardiac silhouette is stable. Progressive bilateral airspace disease most pronounced at the left lung base. No effusion or pneumothorax. No acute bony abnormalities. IMPRESSION: 1. Support devices as above.  No complication after intubation. 2. Multifocal bilateral airspace disease, with progression of left lower lobe consolidation since prior study. Electronically Signed   By: Sharlet Salina M.D.   On: 04/13/2020 15:11   DG CHEST PORT 1 VIEW  Result Date: 04/11/2020 CLINICAL DATA:  Dyspnea, increased shortness of breath, hypertension, COVID-19 positive EXAM: PORTABLE CHEST 1 VIEW COMPARISON:  Portable exam 0956 hours compared to 04/07/2020 FINDINGS: Normal heart size mediastinal contours. LEFT jugular line with tip projecting over SVC. Hazy BILATERAL pulmonary infiltrates in the mid to lower lungs greater on LEFT consistent with multifocal pneumonia and history of COVID-19. No pleural effusion or pneumothorax. IMPRESSION: Increased BILATERAL pulmonary infiltrates consistent with multifocal pneumonia and COVID-19. Electronically Signed   By: Ulyses Southward M.D.   On: 04/11/2020 11:49   DG Abd Portable 1V  Result Date: 04/14/2020 CLINICAL DATA:  45 year old female with COVID-19. Status post NG placement. EXAM: PORTABLE ABDOMEN - 1 VIEW; PORTABLE  CHEST - 1 VIEW COMPARISON:  Chest radiograph dated 04/13/2020. FINDINGS: Endotracheal tube with tip tip approximately 2.5 cm above the carina. Right and left IJ central venous lungs with tips over central SVC. Enteric tube extends below the diaphragm with tip in the distal stomach. No significant interval change in bilateral pulmonary opacities compared to prior radiograph. No pneumothorax. No large pleural effusion. Stable cardiomediastinal silhouette. No acute osseous pathology. Right upper quadrant cholecystectomy clips. IMPRESSION: Enteric tube with tip in the distal stomach. No other interval change. Electronically Signed   By: Elgie Collard M.D.   On: 04/14/2020 22:39    Microbiology No results found for this or any previous visit (from the past 240 hour(s)).  Lab Basic Metabolic Panel: No results for input(s): NA, K, CL, CO2, GLUCOSE, BUN, CREATININE, CALCIUM, MG, PHOS in the last 168 hours. Liver Function Tests: No results for input(s): AST, ALT, ALKPHOS, BILITOT, PROT, ALBUMIN in the last 168 hours. No results for input(s): LIPASE, AMYLASE in the last 168 hours. No results for input(s): AMMONIA in the last 168 hours. CBC: No results for input(s): WBC, NEUTROABS, HGB, HCT, MCV, PLT in the last 168 hours. Cardiac Enzymes: No results for input(s): CKTOTAL, CKMB, CKMBINDEX, TROPONINI in the last 168 hours. Sepsis Labs: No results for input(s): PROCALCITON, WBC, LATICACIDVEN in the last 168 hours.  Procedures/Operations  Mechanical ventilation.  Central line placement   Spelter Janan Bogie 05/07/2020, 11:20 AM

## 2020-05-25 DEATH — deceased
